# Patient Record
Sex: Male | Born: 1960 | Race: Black or African American | Hispanic: No | Marital: Married | Smoking: Former smoker
Health system: Southern US, Community
[De-identification: ages and names within clinical notes are randomized; demographics above are authoritative.]

## PROBLEM LIST (undated history)

## (undated) DIAGNOSIS — C801 Malignant (primary) neoplasm, unspecified: Secondary | ICD-10-CM

## (undated) DIAGNOSIS — I10 Essential (primary) hypertension: Secondary | ICD-10-CM

## (undated) DIAGNOSIS — F17200 Nicotine dependence, unspecified, uncomplicated: Secondary | ICD-10-CM

## (undated) DIAGNOSIS — I82409 Acute embolism and thrombosis of unspecified deep veins of unspecified lower extremity: Secondary | ICD-10-CM

## (undated) HISTORY — DX: Acute embolism and thrombosis of unspecified deep veins of unspecified lower extremity: I82.409

## (undated) HISTORY — PX: KNEE SURGERY: SHX244

---

## 1995-07-20 DIAGNOSIS — I10 Essential (primary) hypertension: Secondary | ICD-10-CM

## 1995-07-20 HISTORY — DX: Essential (primary) hypertension: I10

## 2001-03-01 ENCOUNTER — Ambulatory Visit (HOSPITAL_COMMUNITY): Admission: RE | Admit: 2001-03-01 | Discharge: 2001-03-01 | Payer: Self-pay | Admitting: Orthopedic Surgery

## 2001-04-05 ENCOUNTER — Encounter: Payer: Self-pay | Admitting: Orthopedic Surgery

## 2001-04-06 ENCOUNTER — Ambulatory Visit (HOSPITAL_COMMUNITY): Admission: RE | Admit: 2001-04-06 | Discharge: 2001-04-06 | Payer: Self-pay | Admitting: Orthopedic Surgery

## 2001-04-15 ENCOUNTER — Emergency Department (HOSPITAL_COMMUNITY): Admission: EM | Admit: 2001-04-15 | Discharge: 2001-04-15 | Payer: Self-pay | Admitting: Emergency Medicine

## 2001-05-08 ENCOUNTER — Encounter: Admission: RE | Admit: 2001-05-08 | Discharge: 2001-08-06 | Payer: Self-pay | Admitting: Orthopedic Surgery

## 2001-12-15 ENCOUNTER — Emergency Department (HOSPITAL_COMMUNITY): Admission: EM | Admit: 2001-12-15 | Discharge: 2001-12-15 | Payer: Self-pay | Admitting: Emergency Medicine

## 2010-05-01 ENCOUNTER — Emergency Department (HOSPITAL_COMMUNITY): Admission: EM | Admit: 2010-05-01 | Discharge: 2010-05-01 | Payer: Self-pay | Admitting: Emergency Medicine

## 2010-06-22 ENCOUNTER — Encounter: Admission: RE | Admit: 2010-06-22 | Discharge: 2010-06-22 | Payer: Self-pay | Admitting: Occupational Medicine

## 2010-12-04 NOTE — Op Note (Signed)
[place]. [facility]  Patient:    Mitchell Cooper, Mitchell Cooper Visit Number: [id number] MRN: [id number]          Service Type: DSU Location: [facility] [id number] Attending Physician:  Mayme Genta Dictated by:   Sharmon Revere, M.D. Admit Date:  04/06/2001                             Operative Report  CHIEF COMPLAINT:  Painful left knee.  HISTORY OF PRESENT ILLNESS:  This is a 50 year old male who had sustained a fall while at work and injured his left knee.  The patient had been treated with anti-inflammatories and warm compresses with persistent pain and catching in the left knee. The patient had MRI done which demonstrated cartilage and meniscal damage in the left knee. ACL intact. PAST MEDICAL HISTORY:  Arthroscopy of left knee in the past. History of high blood pressure and diabetes.  ALLERGIES:  No known drug allergies.  MEDICATIONS:  Celebrex.  HABITS:  Occasional alcohol use on weekends and smokes less than a pack of cigarettes a day.  FAMILY HISTORY:  Noncontributory.  REVIEW OF SYSTEMS:   As in history of present illness.  No cardiac, respiratory, no urinary or bowel symptoms.  PHYSICAL EXAMINATION:  VITAL SIGNS:  Temperature 98.2, pulse 76, respiratory rate 16, blood pressure 133/88, height 5 feet 9 inches, weight 215.  HEENT:  Normocephalic.  Eyes:  Conjunctiva and sclera clear.  NECK:  Supple.  CHEST:  Clear.  CARDIOVASCULAR:  S1 and S2 regular.  EXTREMITIES:  Left knee with palpable and audible click, medial compartment and anterior joint line pain with tender patellofemoral joint also.  Range of motion is good.  The patient lacks some full flexion.  Negative drawers, negative Lachmans test.  IMPRESSION:  Internal derangement left knee. Dictated by:   Sharmon Revere, M.D. Attending Physician:  Mayme Genta DD:  04/06/01 TD:  04/06/01 Job: [id number]

## 2010-12-04 NOTE — Op Note (Signed)
[place]. [facility]  Patient:    SARTAJ, SITTER Visit Number: [id number] MRN: [id number]          Service Type: DSU Location: [facility] [id number] Attending Physician:  Mayme Genta Dictated by:   Sharmon Revere, M.D. Admit Date:  04/06/2001                             Operative Report  CHIEF COMPLAINT:  Painful left knee.  HISTORY OF PRESENT ILLNESS:  This is a 50 year old male who had sustained a fall while at work and injured his left knee.  The patient had been treated with anti-inflammatories and warm compresses with persistent pain and catching in the left knee. The patient had MRI done which demonstrated cartilage and meniscal damage in the left knee. ACL intact. PAST MEDICAL HISTORY:  Arthroscopy of left knee in the past. History of high blood pressure and diabetes.  ALLERGIES:  No known drug allergies.  MEDICATIONS:  Celebrex.  HABITS:  Occasional alcohol use on weekends and smokes less than a pack of cigarettes a day.  FAMILY HISTORY:  Noncontributory.  REVIEW OF SYSTEMS:   As in history of present illness.  No cardiac, respiratory, no urinary or bowel symptoms.  PHYSICAL EXAMINATION:  VITAL SIGNS:  Temperature 98.2, pulse 76, respiratory rate 16, blood pressure 133/88, height 5 feet 9 inches, weight 215.  HEENT:  Normocephalic.  Eyes:  Conjunctiva and sclera clear.  NECK:  Supple.  CHEST:  Clear.  CARDIOVASCULAR:  S1 and S2 regular.  EXTREMITIES:  Left knee with palpable and audible click, medial compartment and anterior joint line pain with tender patellofemoral joint also.  Range of motion is good.  The patient lacks some full flexion.  Negative drawers, negative Lachmans test.  IMPRESSION:  Internal derangement left knee. Dictated by:   Sharmon Revere, M.D. Attending Physician:  Mayme Genta DD:  04/06/01 TD:  04/06/01 Job: [id number]

## 2010-12-04 NOTE — Op Note (Signed)
[place]. [facility]  Patient:    Mitchell Cooper, Mitchell Cooper Visit Number: [id number] MRN: [id number]          Service Type: DSU Location: [facility] [id number] Attending Physician:  Mayme Genta Dictated by:   Sharmon Revere, M.D. Admit Date:  04/06/2001                             Operative Report  CHIEF COMPLAINT:  Painful left knee.  HISTORY OF PRESENT ILLNESS:  This is a 50 year old male who had sustained a fall while at work and injured his left knee.  The patient had been treated with anti-inflammatories and warm compresses with persistent pain and catching in the left knee. The patient had MRI done which demonstrated cartilage and meniscal damage in the left knee. ACL intact. PAST MEDICAL HISTORY:  Arthroscopy of left knee in the past. History of high blood pressure and diabetes.  ALLERGIES:  No known drug allergies.  MEDICATIONS:  Celebrex.  HABITS:  Occasional alcohol use on weekends and smokes less than a pack of cigarettes a day.  FAMILY HISTORY:  Noncontributory.  REVIEW OF SYSTEMS:   As in history of present illness.  No cardiac, respiratory, no urinary or bowel symptoms.  PHYSICAL EXAMINATION:  VITAL SIGNS:  Temperature 98.2, pulse 76, respiratory rate 16, blood pressure 133/88, height 5 feet 9 inches, weight 215.  HEENT:  Normocephalic.  Eyes:  Conjunctiva and sclera clear.  NECK:  Supple.  CHEST:  Clear.  CARDIOVASCULAR:  S1 and S2 regular.  EXTREMITIES:  Left knee with palpable and audible click, medial compartment and anterior joint line pain with tender patellofemoral joint also.  Range of motion is good.  The patient lacks some full flexion.  Negative drawers, negative Lachmans test.  IMPRESSION:  Internal derangement left knee. Dictated by:   Sharmon Revere, M.D. Attending Physician:  Mayme Genta DD:  04/06/01 TD:  04/06/01 Job: [id number]

## 2010-12-04 NOTE — Op Note (Signed)
[place]. [facility]  Patient:    Mitchell Cooper, Mitchell Cooper Visit Number: [id number] MRN: [id number]          Service Type: EMS Location: ED Attending Physician:  Orlie Dakin Dictated by:   Sharmon Revere, M.D. Proc. Date: 04/06/01 Admit Date:  04/15/2001 Discharge Date: 04/15/2001                             Operative Report  PREOPERATIVE DIAGNOSIS:  Internal derangement, left knee.  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  ANESTHESIA:  General.  PROCEDURE:  Removal of loose body, synovectomy, chondroplasty patella and femoral condyle, and partial meniscectomy.  DESCRIPTION OF PROCEDURE:  The patient was taken to the operating room after given adequate preoperative medication, given general anesthesia and intubated.  The left knee was prepped with DuraPrep and draped in a sterile manner.  Tourniquet used for hemostasis.  One half inch puncture wound made along the medial and lateral joint line.  Inflow brought through the medial suprapatellar pouch area.  Inspection of the joint revealed hypertrophic synovium both medial and lateral compartments, chondral defect involving the patella as well as some intercondylar notch of the femur, and chondral fragment which had bone off from the femur.  With the use of synovial shaver, synovectomy was done.  Chondroplasty of the patella and the femoral condyle was done with removal of loose fragments with partial resection of the meniscus along the posterior horn.  Copious and abundant irrigation was done. Wound closure was then done with 0 nylon.  Marcaine injected into the joint. Compressive dressing applied.  Knee immobilizer applied.  The patient tolerated the procedure quite well and went to the recovery room in stable and satisfactory condition. Dictated by:   Sharmon Revere, M.D. Attending Physician:  Orlie Dakin DD:   07/16/01 TD:  07/16/01 Job: [id number]

## 2010-12-04 NOTE — Op Note (Signed)
[place]. [facility]  Patient:    Mitchell Cooper, Mitchell Cooper Visit Number: [id number] MRN: [id number]          Service Type: DSU Location: [facility] [id number] Attending Physician:  Mayme Genta Dictated by:   Sharmon Revere, M.D. Admit Date:  04/06/2001                             Operative Report  CHIEF COMPLAINT:  Painful left knee.  HISTORY OF PRESENT ILLNESS:  This is a 50 year old male who had sustained a fall while at work and injured his left knee.  The patient had been treated with anti-inflammatories and warm compresses with persistent pain and catching in the left knee. The patient had MRI done which demonstrated cartilage and meniscal damage in the left knee. ACL intact. PAST MEDICAL HISTORY:  Arthroscopy of left knee in the past. History of high blood pressure and diabetes.  ALLERGIES:  No known drug allergies.  MEDICATIONS:  Celebrex.  HABITS:  Occasional alcohol use on weekends and smokes less than a pack of cigarettes a day.  FAMILY HISTORY:  Noncontributory.  REVIEW OF SYSTEMS:   As in history of present illness.  No cardiac, respiratory, no urinary or bowel symptoms.  PHYSICAL EXAMINATION:  VITAL SIGNS:  Temperature 98.2, pulse 76, respiratory rate 16, blood pressure 133/88, height 5 feet 9 inches, weight 215.  HEENT:  Normocephalic.  Eyes:  Conjunctiva and sclera clear.  NECK:  Supple.  CHEST:  Clear.  CARDIOVASCULAR:  S1 and S2 regular.  EXTREMITIES:  Left knee with palpable and audible click, medial compartment and anterior joint line pain with tender patellofemoral joint also.  Range of motion is good.  The patient lacks some full flexion.  Negative drawers, negative Lachmans test.  IMPRESSION:  Internal derangement left knee. Dictated by:   Sharmon Revere, M.D. Attending Physician:  Mayme Genta DD:  04/06/01 TD:  04/06/01 Job: [id number]

## 2010-12-04 NOTE — Op Note (Signed)
[place]. [facility]  Patient:    Mitchell Cooper, Mitchell Cooper Visit Number: [id number] MRN: [id number]          Service Type: DSU Location: [facility] [id number] Attending Physician:  Mayme Genta Dictated by:   Sharmon Revere, M.D. Admit Date:  04/06/2001                             Operative Report  CHIEF COMPLAINT:  Painful left knee.  HISTORY OF PRESENT ILLNESS:  This is a 50 year old male who had sustained a fall while at work and injured his left knee.  The patient had been treated with anti-inflammatories and warm compresses with persistent pain and catching in the left knee. The patient had MRI done which demonstrated cartilage and meniscal damage in the left knee. ACL intact. PAST MEDICAL HISTORY:  Arthroscopy of left knee in the past. History of high blood pressure and diabetes.  ALLERGIES:  No known drug allergies.  MEDICATIONS:  Celebrex.  HABITS:  Occasional alcohol use on weekends and smokes less than a pack of cigarettes a day.  FAMILY HISTORY:  Noncontributory.  REVIEW OF SYSTEMS:   As in history of present illness.  No cardiac, respiratory, no urinary or bowel symptoms.  PHYSICAL EXAMINATION:  VITAL SIGNS:  Temperature 98.2, pulse 76, respiratory rate 16, blood pressure 133/88, height 5 feet 9 inches, weight 215.  HEENT:  Normocephalic.  Eyes:  Conjunctiva and sclera clear.  NECK:  Supple.  CHEST:  Clear.  CARDIOVASCULAR:  S1 and S2 regular.  EXTREMITIES:  Left knee with palpable and audible click, medial compartment and anterior joint line pain with tender patellofemoral joint also.  Range of motion is good.  The patient lacks some full flexion.  Negative drawers, negative Lachmans test.  IMPRESSION:  Internal derangement left knee. Dictated by:   Sharmon Revere, M.D. Attending Physician:  Mayme Genta DD:  04/06/01 TD:  04/06/01 Job: [id number]

## 2010-12-04 NOTE — Op Note (Signed)
[place]. [facility]  Patient:    Mitchell Cooper, Mitchell Cooper Visit Number: [id number] MRN: [id number]          Service Type: DSU Location: [facility] [id number] Attending Physician:  Mayme Genta Dictated by:   Sharmon Revere, M.D. Admit Date:  04/06/2001                             Operative Report  CHIEF COMPLAINT:  Painful left knee.  HISTORY OF PRESENT ILLNESS:  This is a 50 year old male who had sustained a fall while at work and injured his left knee.  The patient had been treated with anti-inflammatories and warm compresses with persistent pain and catching in the left knee. The patient had MRI done which demonstrated cartilage and meniscal damage in the left knee. ACL intact. PAST MEDICAL HISTORY:  Arthroscopy of left knee in the past. History of high blood pressure and diabetes.  ALLERGIES:  No known drug allergies.  MEDICATIONS:  Celebrex.  HABITS:  Occasional alcohol use on weekends and smokes less than a pack of cigarettes a day.  FAMILY HISTORY:  Noncontributory.  REVIEW OF SYSTEMS:   As in history of present illness.  No cardiac, respiratory, no urinary or bowel symptoms.  PHYSICAL EXAMINATION:  VITAL SIGNS:  Temperature 98.2, pulse 76, respiratory rate 16, blood pressure 133/88, height 5 feet 9 inches, weight 215.  HEENT:  Normocephalic.  Eyes:  Conjunctiva and sclera clear.  NECK:  Supple.  CHEST:  Clear.  CARDIOVASCULAR:  S1 and S2 regular.  EXTREMITIES:  Left knee with palpable and audible click, medial compartment and anterior joint line pain with tender patellofemoral joint also.  Range of motion is good.  The patient lacks some full flexion.  Negative drawers, negative Lachmans test.  IMPRESSION:  Internal derangement left knee. Dictated by:   Sharmon Revere, M.D. Attending Physician:  Mayme Genta DD:  04/06/01 TD:  04/06/01 Job: [id number]

## 2010-12-04 NOTE — Op Note (Signed)
[place]. [facility]  Patient:    Mitchell Cooper, Mitchell Cooper Visit Number: [id number] MRN: [id number]          Service Type: DSU Location: [facility] [id number] Attending Physician:  Mayme Genta Dictated by:   Sharmon Revere, M.D. Admit Date:  04/06/2001                             Operative Report  CHIEF COMPLAINT:  Painful left knee.  HISTORY OF PRESENT ILLNESS:  This is a 50 year old male who had sustained a fall while at work and injured his left knee.  The patient had been treated with anti-inflammatories and warm compresses with persistent pain and catching in the left knee. The patient had MRI done which demonstrated cartilage and meniscal damage in the left knee. ACL intact. PAST MEDICAL HISTORY:  Arthroscopy of left knee in the past. History of high blood pressure and diabetes.  ALLERGIES:  No known drug allergies.  MEDICATIONS:  Celebrex.  HABITS:  Occasional alcohol use on weekends and smokes less than a pack of cigarettes a day.  FAMILY HISTORY:  Noncontributory.  REVIEW OF SYSTEMS:   As in history of present illness.  No cardiac, respiratory, no urinary or bowel symptoms.  PHYSICAL EXAMINATION:  VITAL SIGNS:  Temperature 98.2, pulse 76, respiratory rate 16, blood pressure 133/88, height 5 feet 9 inches, weight 215.  HEENT:  Normocephalic.  Eyes:  Conjunctiva and sclera clear.  NECK:  Supple.  CHEST:  Clear.  CARDIOVASCULAR:  S1 and S2 regular.  EXTREMITIES:  Left knee with palpable and audible click, medial compartment and anterior joint line pain with tender patellofemoral joint also.  Range of motion is good.  The patient lacks some full flexion.  Negative drawers, negative Lachmans test.  IMPRESSION:  Internal derangement left knee. Dictated by:   Sharmon Revere, M.D. Attending Physician:  Mayme Genta DD:  04/06/01 TD:  04/06/01 Job: [id number]

## 2010-12-04 NOTE — H&P (Signed)
[place]. [facility]  Patient:    Mitchell Cooper, Mitchell Cooper Visit Number: [id number] MRN: [id number]          Service Type: DSU Location: [facility] [id number] Attending Physician:  Mayme Genta Dictated by:   Sharmon Revere, M.D. Admit Date:  04/06/2001                           History and Physical  CHIEF COMPLAINT:  Painful left knee.  HISTORY OF PRESENT ILLNESS:  This is a 50 year old male who had sustained a fall while at work and injured his left knee.  The patient had been treated with anti-inflammatories and warm compresses with persistent pain and catching in the left knee. The patient had MRI done which demonstrated cartilage and meniscal damage in the left knee. ACL intact. PAST MEDICAL HISTORY:  Arthroscopy of left knee in the past. History of high blood pressure and diabetes.  ALLERGIES:  No known drug allergies.  MEDICATIONS:  Celebrex.  HABITS:  Occasional alcohol use on weekends and smokes less than a pack of cigarettes a day.  FAMILY HISTORY:  Noncontributory.  REVIEW OF SYSTEMS:   As in history of present illness.  No cardiac, respiratory, no urinary or bowel symptoms.  PHYSICAL EXAMINATION:  VITAL SIGNS:  Temperature 98.2, pulse 76, respiratory rate 16, blood pressure 133/88, height 5 feet 9 inches, weight 215.  HEENT:  Normocephalic.  Eyes:  Conjunctiva and sclera clear.  NECK:  Supple.  CHEST:  Clear.  CARDIOVASCULAR:  S1 and S2 regular.  EXTREMITIES:  Left knee with palpable and audible click, medial compartment and anterior joint line pain with tender patellofemoral joint also.  Range of motion is good.  The patient lacks some full flexion.  Negative drawers, negative Lachmans test.  IMPRESSION:  Internal derangement left knee. Dictated by:   Sharmon Revere, M.D. Attending Physician:  Mayme Genta DD:  04/06/01 TD:  04/06/01 Job: [id number]

## 2010-12-04 NOTE — Op Note (Signed)
[place]. [facility]  Patient:    CORDE, MOURER Visit Number: [id number] MRN: [id number]          Service Type: DSU Location: [facility] [id number] Attending Physician:  Mayme Genta Dictated by:   Sharmon Revere, M.D. Admit Date:  04/06/2001                             Operative Report  CHIEF COMPLAINT:  Painful left knee.  HISTORY OF PRESENT ILLNESS:  This is a 50 year old male who had sustained a fall while at work and injured his left knee.  The patient had been treated with anti-inflammatories and warm compresses with persistent pain and catching in the left knee. The patient had MRI done which demonstrated cartilage and meniscal damage in the left knee. ACL intact. PAST MEDICAL HISTORY:  Arthroscopy of left knee in the past. History of high blood pressure and diabetes.  ALLERGIES:  No known drug allergies.  MEDICATIONS:  Celebrex.  HABITS:  Occasional alcohol use on weekends and smokes less than a pack of cigarettes a day.  FAMILY HISTORY:  Noncontributory.  REVIEW OF SYSTEMS:   As in history of present illness.  No cardiac, respiratory, no urinary or bowel symptoms.  PHYSICAL EXAMINATION:  VITAL SIGNS:  Temperature 98.2, pulse 76, respiratory rate 16, blood pressure 133/88, height 5 feet 9 inches, weight 215.  HEENT:  Normocephalic.  Eyes:  Conjunctiva and sclera clear.  NECK:  Supple.  CHEST:  Clear.  CARDIOVASCULAR:  S1 and S2 regular.  EXTREMITIES:  Left knee with palpable and audible click, medial compartment and anterior joint line pain with tender patellofemoral joint also.  Range of motion is good.  The patient lacks some full flexion.  Negative drawers, negative Lachmans test.  IMPRESSION:  Internal derangement left knee. Dictated by:   Sharmon Revere, M.D. Attending Physician:  Mayme Genta DD:  04/06/01 TD:  04/06/01 Job: [id number]

## 2011-01-14 ENCOUNTER — Emergency Department (HOSPITAL_COMMUNITY): Payer: No Typology Code available for payment source

## 2011-01-14 ENCOUNTER — Emergency Department (HOSPITAL_COMMUNITY)
Admission: EM | Admit: 2011-01-14 | Discharge: 2011-01-14 | Disposition: A | Discharge status: Home / Self Care | Payer: No Typology Code available for payment source | Attending: Emergency Medicine | Admitting: Emergency Medicine

## 2011-01-14 DIAGNOSIS — M25569 Pain in unspecified knee: Secondary | ICD-10-CM | POA: Insufficient documentation

## 2011-01-14 DIAGNOSIS — S83106A Unspecified dislocation of unspecified knee, initial encounter: Secondary | ICD-10-CM | POA: Insufficient documentation

## 2015-04-28 ENCOUNTER — Encounter (HOSPITAL_COMMUNITY): Payer: Self-pay | Admitting: Emergency Medicine

## 2015-04-28 ENCOUNTER — Emergency Department (HOSPITAL_COMMUNITY): Payer: No Typology Code available for payment source

## 2015-04-28 ENCOUNTER — Emergency Department (HOSPITAL_COMMUNITY)
Admission: EM | Admit: 2015-04-28 | Discharge: 2015-04-28 | Disposition: A | Discharge status: Home / Self Care | Payer: No Typology Code available for payment source | Attending: Emergency Medicine | Admitting: Emergency Medicine

## 2015-04-28 DIAGNOSIS — W208XXA Other cause of strike by thrown, projected or falling object, initial encounter: Secondary | ICD-10-CM | POA: Diagnosis not present

## 2015-04-28 DIAGNOSIS — Y9389 Activity, other specified: Secondary | ICD-10-CM | POA: Insufficient documentation

## 2015-04-28 DIAGNOSIS — S6992XA Unspecified injury of left wrist, hand and finger(s), initial encounter: Secondary | ICD-10-CM | POA: Insufficient documentation

## 2015-04-28 DIAGNOSIS — M25532 Pain in left wrist: Secondary | ICD-10-CM

## 2015-04-28 DIAGNOSIS — Y998 Other external cause status: Secondary | ICD-10-CM | POA: Diagnosis not present

## 2015-04-28 DIAGNOSIS — S8992XA Unspecified injury of left lower leg, initial encounter: Secondary | ICD-10-CM | POA: Diagnosis not present

## 2015-04-28 DIAGNOSIS — S4992XA Unspecified injury of left shoulder and upper arm, initial encounter: Secondary | ICD-10-CM | POA: Insufficient documentation

## 2015-04-28 DIAGNOSIS — Y9289 Other specified places as the place of occurrence of the external cause: Secondary | ICD-10-CM | POA: Insufficient documentation

## 2015-04-28 DIAGNOSIS — M25562 Pain in left knee: Secondary | ICD-10-CM

## 2015-04-28 DIAGNOSIS — Z720 Tobacco use: Secondary | ICD-10-CM | POA: Insufficient documentation

## 2015-04-28 DIAGNOSIS — I10 Essential (primary) hypertension: Secondary | ICD-10-CM | POA: Diagnosis not present

## 2015-04-28 DIAGNOSIS — S4991XA Unspecified injury of right shoulder and upper arm, initial encounter: Secondary | ICD-10-CM | POA: Insufficient documentation

## 2015-04-28 HISTORY — DX: Essential (primary) hypertension: I10

## 2015-04-28 MED ORDER — IBUPROFEN 800 MG PO TABS
800.0000 mg | ORAL_TABLET | Freq: Once | ORAL | Status: AC
  Administered 2015-04-28: 800 mg via ORAL
  Filled 2015-04-28: qty 1

## 2015-04-28 MED ORDER — IBUPROFEN 800 MG PO TABS: 800.0000 mg | ORAL_TABLET | Freq: Three times a day (TID) | ORAL | Status: DC

## 2015-04-28 MED ORDER — METHOCARBAMOL 500 MG PO TABS: 500.0000 mg | ORAL_TABLET | Freq: Two times a day (BID) | ORAL | Status: DC

## 2015-04-28 NOTE — ED Provider Notes (Signed)
CSN: [id number]     Arrival date & time 04/28/15  1107 History  By signing my name below, I, Mitchell Cooper, attest that this documentation has been prepared under the direction and in the presence of [employer]. Electronically Signed: Sonum Cooper, Education administrator. 04/28/2015. 12:36 PM.    Chief Complaint  Patient presents with  . Knee Pain    The history is provided by the patient. No language interpreter was used.     HPI Comments: Mitchell Cooper is a 54 y.o. male who presents to the Emergency Department complaining of left knee pain and left wrist pain, which started 2 days ago after a house deck collapsed from underneath him. He denies hitting his head or LOC. He reports movement exacerbates his pain. He has tried warm bath soaks with mild symptom relief. He denies fever, chills, HA, lightheadedness, dizziness, visual disturbances, CP, SOB, abdominal pain, nausea, vomiting, diarrhea, bowel/bladder incontinence, saddle anesthesia.   Past Medical History  Diagnosis Date  . Hypertension    History reviewed. No pertinent past surgical history. No family history on file. Social History  Substance Use Topics  . Smoking status: Current Every Day Smoker  . Smokeless tobacco: None  . Alcohol Use: Yes      Review of Systems  Constitutional: Negative for fever and chills.  Eyes: Negative for visual disturbance.  Respiratory: Negative for shortness of breath.   Cardiovascular: Negative for chest pain.  Gastrointestinal: Negative for nausea, vomiting and abdominal pain.  Musculoskeletal: Positive for myalgias, back pain and arthralgias (left knee, left wrist). Negative for neck pain and neck stiffness.  Skin: Negative for wound.  Neurological: Negative for dizziness, weakness, light-headedness, numbness and headaches.      Allergies  Review of patient's allergies indicates not on file.  Home Medications   Prior to Admission medications   Not on File    BP 191/107 mmHg   Pulse 99  Temp(Src) 98.1 F (36.7 C) (Oral)  Resp 16  SpO2 100% Physical Exam  Constitutional: He is oriented to person, place, and time. He appears well-developed and well-nourished. No distress.  HENT:  Head: Normocephalic and atraumatic.  Right Ear: External ear normal.  Left Ear: External ear normal.  Nose: Nose normal.  Mouth/Throat: Uvula is midline, oropharynx is clear and moist and mucous membranes are normal.  Eyes: Conjunctivae, EOM and lids are normal. Pupils are equal, round, and reactive to light. Right eye exhibits no discharge. Left eye exhibits no discharge. No scleral icterus.  Neck: Normal range of motion. Neck supple. Muscular tenderness present. No spinous process tenderness present.  Mild tenderness to palpation of trapezius bilaterally. No midline cervical tenderness, step-off, or deformity.  Cardiovascular: Normal rate, regular rhythm, normal heart sounds, intact distal pulses and normal pulses.   Pulmonary/Chest: Effort normal and breath sounds normal. No respiratory distress. He has no wheezes. He has no rales.  Abdominal: Soft. Normal appearance. He exhibits no distension and no mass. There is no tenderness. There is no rigidity, no rebound and no guarding.  Musculoskeletal: Normal range of motion. He exhibits tenderness. He exhibits no edema.       Left wrist: He exhibits tenderness and bony tenderness. He exhibits normal range of motion, no swelling, no effusion, no crepitus and no deformity.       Left knee: He exhibits swelling and effusion. He exhibits normal range of motion, no deformity, no erythema, no LCL laxity and no MCL laxity. Tenderness found. Medial joint line and lateral joint  line tenderness noted.  Mild tenderness to palpation of radial aspect of left wrist. Full range of motion of left wrist. No significant edema or erythema. Strength and sensation intact. Distal pulses intact. Cap refill less than 3 seconds. Tenderness to palpation of left knee with  mild edema. No LCL or MCL laxity. Anterior and posterior drawer negative. No significant erythema or heat. Strength and sensation intact. Distal pulses intact.  Neurological: He is alert and oriented to person, place, and time. He has normal strength. No cranial nerve deficit or sensory deficit.  Skin: Skin is warm, dry and intact. No rash noted. He is not diaphoretic. No erythema. No pallor.  Psychiatric: He has a normal mood and affect. His speech is normal and behavior is normal. Judgment and thought content normal.  Nursing note and vitals reviewed.   ED Course  Procedures (including critical care time)  DIAGNOSTIC STUDIES: Oxygen Saturation is 100% on RA, normal by my interpretation.    COORDINATION OF CARE: 12:43 PM Discussed treatment plan with pt at bedside and pt agreed to plan.   Labs Review Labs Reviewed - No data to display  Imaging Review Dg Wrist Complete Left  04/28/2015   CLINICAL DATA:  Fall yesterday, left wrist pain.  EXAM: LEFT WRIST - COMPLETE 3+ VIEW  COMPARISON:  None.  FINDINGS: There is degenerative joint space narrowing at the radiocarpal joint space.  Chronic calcific fragments are seen adjacent to the scaphoid bone and there is widening of the scapholunate distance suggesting an associated ligamentous disruption. Additional calcific fragment is seen near the base of the left first metacarpal bone, of uncertain age, but also suspected to be chronic.  No acute-appearing fracture line or displaced fracture fragment identified.  IMPRESSION: 1. Chronic-appearing calcific fragments adjacent to the left scaphoid bone suggesting previous injury. 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 2. Additional small calcific fragment near the base of the left first metacarpal bone which is of uncertain age but also suspected to be chronic. 3. No convincing evidence of an acute fracture or  dislocation. 4. Degenerative joint space narrowing at the radiocarpal joint.   Electronically Signed   By: Franki Cabot M.D.   On: 04/28/2015 13:54   Dg Knee Complete 4 Views Left  04/28/2015   CLINICAL DATA:  Anterior left knee pain for 1 day after fall  EXAM: LEFT KNEE - COMPLETE 4+ VIEW  COMPARISON:  01/14/2011 left knee radiographs  FINDINGS: Moderate suprapatellar left knee joint effusion. No fracture, dislocation or suspicious focal osseous lesion. Chondrocalcinosis is seen in the medial meniscus. Interval progression of moderate to severe tricompartmental osteoarthritis, most prominent in the patellofemoral compartment.  IMPRESSION: 1. Moderate suprapatellar left knee joint effusion. No fracture or dislocation in the left knee. 2. 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   Electronically Signed   By: Ilona Sorrel M.D.   On: 04/28/2015 12:16   I have personally reviewed and evaluated these images as part of my medical decision-making.   EKG Interpretation None      MDM   Final diagnoses:  Left knee pain  Left wrist pain    54 year old male resents with left knee pain and left wrist pain after a fall, which occurred Saturday. He states he was at a party, standing on a deck, which collapsed. He denies hitting his head  or LOC. He denies fever, chills, HA, lightheadedness, dizziness, visual disturbances, CP, SOB, abdominal pain, nausea, vomiting, diarrhea, bowel/bladder incontinence, saddle anesthesia.   Patient is afebrile. Patient is hypertensive, though he states he has not taken his blood pressure medications today. Head normocephalic and atraumatic. Mild tenderness to palpation of trapezius bilaterally. No midline cervical tenderness, step-off, or deformity. Heart regular rate and rhythm. Lungs clear to auscultation  bilaterally. Abdomen soft, nontender, nondistended. Mild tenderness to palpation of radial aspect of left wrist. Full range of motion of left wrist. No significant edema or erythema. Strength and sensation intact. Distal pulses intact. Tenderness to palpation of left knee with mild edema. No significant erythema or heat. Full range of motion of left knee. Strength and sensation intact. Distal pulses intact. Normal neuro exam with no focal deficit.  Will obtain imaging of left knee and left wrist. Patient given ibuprofen and ice for pain.  Imaging of left knee reveals moderate suprapatellar left knee joint effusion with no fracture or dislocation. Imaging of left wrist negative for acute fracture or dislocation.  Will treat with left knee sleeve and left wrist splint. Patient prescribed ibuprofen and robaxin for symptoms. Patient to follow-up with orthopedist if symptoms do not improve. Return precautions discussed.  I personally performed the services described in this documentation, which was scribed in my presence. The recorded information has been reviewed and is accurate.  BP 186/88 mmHg  Pulse 92  Temp(Src) 98.1 F (36.7 C) (Oral)  Resp 16  SpO2 99%   Marella Chimes, PA-C 04/28/15 1901  Davonna Belling, MD 04/29/15 [id number]

## 2015-04-28 NOTE — Discharge Instructions (Signed)
1. Medications: ibuprofen, robaxin, usual home medications 2. Treatment: rest, drink plenty of fluids, ice, elevate, use splint and knee sleeve 3. Follow Up: please followup with your orthopedist for discussion of your diagnoses and further evaluation after today's visit; please return to the ER for increased swelling, numbness, color change, severe pain, new or worsening symptoms   Knee Pain Knee pain is a common problem. It can have many causes. The pain often goes away by following your doctor's home care instructions. Treatment for ongoing pain will depend on the cause of your pain. If your knee pain continues, more tests may be needed to diagnose your condition. Tests may include X-rays or other imaging studies of your knee. HOME CARE  Take medicines only as told by your doctor.  Rest your knee and keep it raised (elevated) while you are resting.  Do not do things that cause pain or make your pain worse.  Avoid activities where both feet leave the ground at the same time, such as running, jumping rope, or doing jumping jacks.  Apply ice to the knee area:  Put ice in a plastic bag.  Place a towel between your skin and the bag.  Leave the ice on for 20 minutes, 2-3 times a day.  Ask your doctor if you should wear an elastic knee support.  Sleep with a pillow under your knee.  Lose weight if you are overweight. Being overweight can make your knee hurt more.  Do not use any tobacco products, including cigarettes, chewing tobacco, or electronic cigarettes. If you need help quitting, ask your doctor. Smoking may slow the healing of any bone and joint problems that you may have. GET HELP IF:  Your knee pain does not stop, it changes, or it gets worse.  You have a fever along with knee pain.  Your knee gives out or locks up.  Your knee becomes more swollen. GET HELP RIGHT AWAY IF:   Your knee feels hot to the touch.  You have chest pain or trouble breathing.   This  information is not intended to replace advice given to you by your health care provider. Make sure you discuss any questions you have with your health care provider.   Document Released: 10/01/2008 Document Revised: 07/26/2014 Document Reviewed: 09/05/2013 Elsevier Interactive Patient Education 2016 Elsevier Inc.  Wrist Pain There are many things that can cause wrist pain. Some common causes include:  An injury to the wrist area.  Overuse of the joint.  A condition that causes too much pressure to be put on a nerve in the wrist (carpal tunnel syndrome).  Wear and tear of the joints that happens as a person gets older (osteoarthritis).  Other types of arthritis. Sometimes, the cause is not known. The pain often goes away when you follow instructions from your doctor about relieving pain at home. If your wrist pain does not go away, tests may need to be done to find the cause. HOME CARE Pay attention to any changes in your symptoms. Take these actions to help with your pain:  Rest your wrist for at least 48 hours or as told by your doctor.  If your doctor tells you to, put ice on the injured area:  Put ice in a plastic bag.  Place a towel between your skin and the bag.  Leave the ice on for 20 minutes, 2-3 times per day.  Keep your arm raised (elevated) above the level of your heart while you are sitting or  lying down.  If a splint or elastic bandage has been put on the injured area:  Wear it as told by your doctor.  Take the splint or bandage off only as told by your doctor.  Loosen the splint or bandage if your fingers lose feeling (are numb) or have a tingling feeling, or if they turn cold or blue.  Take over-the-counter and prescription medicines only as told by your doctor.  Keep all follow-up visits as told by your doctor. This is important. GET HELP IF:  Your pain is not helped by treatment.  Your pain gets worse. GET HELP RIGHT AWAY IF:   Your fingers  swell.  Your fingers turn white, very red, or cold and blue.  Your fingers lose feeling or have a tingling feeling.  You have trouble moving your fingers.   This information is not intended to replace advice given to you by your health care provider. Make sure you discuss any questions you have with your health care provider.   Document Released: 12/22/2007 Document Revised: 03/26/2015 Document Reviewed: 11/20/2014 Elsevier Interactive Patient Education 2016 Elsevier Inc.  Musculoskeletal Pain Musculoskeletal pain is muscle and boney aches and pains. These pains can occur in any part of the body. Your caregiver may treat you without knowing the cause of the pain. They may treat you if blood or urine tests, X-rays, and other tests were normal.  CAUSES There is often not a definite cause or reason for these pains. These pains may be caused by a type of germ (virus). The discomfort may also come from overuse. Overuse includes working out too hard when your body is not fit. Boney aches also come from weather changes. Bone is sensitive to atmospheric pressure changes. HOME CARE INSTRUCTIONS   Ask when your test results will be ready. Make sure you get your test results.  Only take over-the-counter or prescription medicines for pain, discomfort, or fever as directed by your caregiver. If you were given medications for your condition, do not drive, operate machinery or power tools, or sign legal documents for 24 hours. Do not drink alcohol. Do not take sleeping pills or other medications that may interfere with treatment.  Continue all activities unless the activities cause more pain. When the pain lessens, slowly resume normal activities. Gradually increase the intensity and duration of the activities or exercise.  During periods of severe pain, bed rest may be helpful. Lay or sit in any position that is comfortable.  Putting ice on the injured area.  Put ice in a bag.  Place a towel  between your skin and the bag.  Leave the ice on for 15 to 20 minutes, 3 to 4 times a day.  Follow up with your caregiver for continued problems and no reason can be found for the pain. If the pain becomes worse or does not go away, it may be necessary to repeat tests or do additional testing. Your caregiver may need to look further for a possible cause. SEEK IMMEDIATE MEDICAL CARE IF:  You have pain that is getting worse and is not relieved by medications.  You develop chest pain that is associated with shortness or breath, sweating, feeling sick to your stomach (nauseous), or throw up (vomit).  Your pain becomes localized to the abdomen.  You develop any new symptoms that seem different or that concern you. MAKE SURE YOU:   Understand these instructions.  Will watch your condition.  Will get help right away if you are not  doing well or get worse.   This information is not intended to replace advice given to you by your health care provider. Make sure you discuss any questions you have with your health care provider.   Document Released: 07/05/2005 Document Revised: 09/27/2011 Document Reviewed: 03/09/2013 Elsevier Interactive Patient Education 2016 [employer].   Emergency Department Resource Guide 1) Find a Doctor and Pay Out of Pocket Although you won't have to find out who is covered by your insurance plan, it is a good idea to ask around and get recommendations. You will then need to call the office and see if the doctor you have chosen will accept you as a new patient and what types of options they offer for patients who are self-pay. Some doctors offer discounts or will set up payment plans for their patients who do not have insurance, but you will need to ask so you aren't surprised when you get to your appointment.  2) Contact Your Local Health Department Not all health departments have doctors that can see patients for sick visits, but many do, so it is worth a call to  see if yours does. If you don't know where your local health department is, you can check in your phone book. The CDC also has a tool to help you locate your state's health department, and many state websites also have listings of all of their local health departments.  3) Find a [facility] If your illness is not likely to be very severe or complicated, you may want to try a walk in clinic. These are popping up all over the country in pharmacies, drugstores, and shopping centers. They're usually staffed by nurse practitioners or physician assistants that have been trained to treat common illnesses and complaints. They're usually fairly quick and inexpensive. However, if you have serious medical issues or chronic medical problems, these are probably not your best option.  No Primary Care Doctor: - Call Health Connect at  [phone] - they can help you locate a primary care doctor that  accepts your insurance, provides certain services, etc. - Physician Referral Service- [phone]  Chronic Pain Problems: Organization         Address  Phone   Notes  [facility]  [phone] Patients need to be referred by their primary care doctor.   Medication Assistance: Organization         Address  Phone   Notes  [facility] Medication [facility] [address] [place], [place] [address] [phone] --Must be a resident of [facility] -- Must have NO insurance coverage whatsoever (no Medicaid/ Medicare, etc.) -- The pt. MUST have a primary care doctor that directs their care regularly and follows them in the community   [facility]ist  [phone]   [employer]  [phone]    Agencies that provide inexpensive medical care: Organization         Address  Phone   Notes  [place]  [phone]   [facility]    [phone]   [facility] [place], [place] [address] [phone]   Center [address], [place] [phone]   Planned Parenthood    [phone]   [facility]    [phone]   [place] and [place], [place] Phone:  [phone], Fax:  [phone]  Hours of Operation:  9 am - 6 pm, M-F.  Also accepts Medicaid/Medicare and self-pay.  [facility] for [place], [address], [place] Phone: [phone], Fax: [phone]. Hours of Operation:  8:30 am - 5:30 pm, M-F.  Also accepts Medicaid and self-pay.  [facility] [place] [address] [place] Phone: [phone]   [place], [place], [place] [phone], Ext. [phone] Mondays & Thursdays: 7-9 AM.  First 15 patients are seen on a first come, first serve basis.    [place] Providers:  Organization         Address  Phone   Notes  [facility] [address], [place] [phone] Also accepts self-pay patients.  [facility] [id number] [place], [place]  [phone]   [place], [address], [place] [phone]   [facility] [address], [place] [phone]   Lucianne Lei [address], [place]   [phone] Only accepts [place] Access [place] patients after they have their name applied to their card.   Self-Pay (no insurance) in [facility]:  Organization         Address  Phone   Notes  Sickle Cell Patients, [facility] [place] [phone]   [facility] [place] [phone]   [facility] [place]  [place], [place], [place] [phone]   [facility]/Dr. Osei-Bonsu  [address] [place] or [address], [place] [phone] Phone number for both [place] and [place] locations is the same.  Urgent Medical and [facility] [address] [place] [address] [place] [phone]   [facility] [address], [place] or [address] [phone]   [facility] [address] [place] [phone], phone; [phone], fax Sees patients 1st and 3rd Saturday of every month.  Must not qualify for public or private insurance (i.e. Medicaid, Medicare, [place] Health Choice, Veterans' Benefits)  Household income should be no more than 200% of the poverty level The clinic cannot treat you if you are pregnant or think you are pregnant  Sexually transmitted diseases are not treated at the clinic.    Dental Care: Organization         Address  Phone  Notes  [facility] Department of [facility] [place] [phone] Accepts children up to age 61 who are enrolled in [place] or [place]; pregnant women with a Medicaid card; and children who have applied for Medicaid or [place] Health Choice, but were declined, whose parents can pay a reduced fee at time of service.  [facility] Department of [facility]  [address], [place] [phone] Accepts children up to age 27 who are enrolled in [place] or [place]; pregnant women with a Medicaid card; and children who have applied for Medicaid or [place] Health Choice, but were declined, whose parents can pay a reduced fee at time of service.  [place] Adult Dental Access PROGRAM  [place] [phone] Patients are seen by appointment only. Walk-ins are not accepted. [place] will see  patients 28 years of age and older. Monday - Tuesday (8am-5pm) Most Wednesdays (8:30-5pm) $30 per visit, cash only  [facility] Adult Dental Access PROGRAM  [address], [facility] [phone] Patients are  seen by appointment only. Walk-ins are not accepted. [place] will see patients 27 years of age and older. One Wednesday Evening (Monthly: Volunteer Based).  $30 per visit, cash only  [place]  [phone] for adults; Children under age 27, call Graduate Pediatric Dentistry at [phone]. Children aged 34-14, please call [phone] to request a pediatric application.  Dental services are provided in all areas of dental care including fillings, crowns and bridges, complete and partial dentures, implants, gum treatment, root canals, and extractions. Preventive care is also provided. Treatment is provided to both adults and children. Patients are selected via a lottery and there is often a waiting list.   [facility] [address] [place]  [phone] [email or web address]   [facility] [address] [place], [place] [phone], Ext. [phone] Second and Fourth Thursday of each month, opens at 6:30 AM; Clinic ends at 9 AM.  Patients are seen on a first-come first-served basis, and a limited number are seen during each clinic.   [facility]  [address] Hillard Danker [place], [place] [phone]   Eligibility Requirements You must have lived in [place], [place], or [place] counties for at least the last three months.   You cannot be eligible for state or federal sponsored [employer], including [employer], [place], or [employer].   You generally cannot be eligible for healthcare insurance through your employer.    How to apply: Eligibility screenings are held every Tuesday and Wednesday afternoon from 1:00 pm until 4:00 pm. You do not need an appointment for the interview!  [facility] [address] [place], [place]   [place]  [place] Department  [place]  [phone]     Behavioral Health Resources in the Community: Intensive Outpatient Programs Organization         Address  Phone  Notes  [place]. [address], [place], [place] [phone]   [facility] [address], [place], [place]   ADS: Alcohol & Drug Svcs [address], [place], [place]   [facility] [address],  [place], [place] or [phone]   Substance Abuse Resources Organization         Address  Phone  Notes  Alcohol and Drug Services  [phone]   [place]  [phone]   [facility]   Chinita Pester  [phone]   Residential & Outpatient Substance Abuse Program  [phone]   Psychological Services Organization         Address  Phone  Notes  [facility]  [place]  [phone]   [place] [address], [place] or [phone]    Mobile Crisis Teams Organization         Address  Phone  Notes  [facility], Mobile Crisis Care Unit  [phone]   [facility]  [address] [place], [place]   [facility] [address] [place] [phone]    Self-Help/Support Groups Organization  Address  Phone             Notes  [place]. of [place] - variety of support groups  [place] Call for more information  Narcotics Anonymous (NA), [facility] [address], [employer] [place]  2 meetings at this location   Special educational needs teacher         Address  Phone  Notes  [facility]idential Treatment [place],    [place]  [phone]   [facility]  [address], [place] [id number], [place], [place]   [place], [place] [phone] Admissions: 8am-3pm M-F  Incentives  Substance [place] [address]    [place], [place] [phone]   [facility] [address] [place], [place], [place]   [facility] [address]  [place], [place]   [facility] - Intensive Outpatient [address] Kristeen [address], [place], [place]   [facility] [address].,  [place], [place] [phone] or [phone]   [facility] ([facility]) [address] [place], [place] Accepts Medicaid  [facility] Myra [address]  [place] [phone] Substance Abuse/Addiction Treatment   [facility] Organization         Address  Phone  Notes  [facility]  [phone]   Domenic Schwab, PhD [address] Arlis [place], [place]   [phone] or [phone]   [place], [place] [phone]   [facility] [address], [place], [place] [phone] Insurance/Medicaid/sponsorship through [facility] and Families [address] [place]                                    [place], [place] [phone] [place] [address], [place] [phone]    Dr. Adele Schilder  [phone]   [facility] Dept. 1) [address], [place] 2) [place] 3)  [place], [place] [phone]  [phone]   [place] [phone] or [phone] (After Hours)

## 2015-04-28 NOTE — ED Notes (Signed)
Per pt, states deck collapsed on him, now having left knee pain

## 2016-06-14 ENCOUNTER — Emergency Department (HOSPITAL_COMMUNITY): Payer: BLUE CROSS/BLUE SHIELD

## 2016-06-14 ENCOUNTER — Inpatient Hospital Stay (HOSPITAL_COMMUNITY)
Admission: EM | Admit: 2016-06-14 | Discharge: 2016-06-17 | DRG: 982 | Disposition: A | Discharge status: Home / Self Care | Payer: BLUE CROSS/BLUE SHIELD | Attending: Internal Medicine | Admitting: Internal Medicine

## 2016-06-14 ENCOUNTER — Encounter (HOSPITAL_COMMUNITY): Payer: Self-pay | Admitting: Emergency Medicine

## 2016-06-14 DIAGNOSIS — R0902 Hypoxemia: Secondary | ICD-10-CM | POA: Diagnosis present

## 2016-06-14 DIAGNOSIS — F17200 Nicotine dependence, unspecified, uncomplicated: Secondary | ICD-10-CM | POA: Diagnosis present

## 2016-06-14 DIAGNOSIS — I129 Hypertensive chronic kidney disease with stage 1 through stage 4 chronic kidney disease, or unspecified chronic kidney disease: Secondary | ICD-10-CM | POA: Diagnosis present

## 2016-06-14 DIAGNOSIS — N183 Chronic kidney disease, stage 3 unspecified: Secondary | ICD-10-CM | POA: Diagnosis present

## 2016-06-14 DIAGNOSIS — M7989 Other specified soft tissue disorders: Secondary | ICD-10-CM | POA: Diagnosis not present

## 2016-06-14 DIAGNOSIS — R000 Tachycardia, unspecified: Secondary | ICD-10-CM | POA: Diagnosis present

## 2016-06-14 DIAGNOSIS — I82492 Acute embolism and thrombosis of other specified deep vein of left lower extremity: Secondary | ICD-10-CM | POA: Diagnosis not present

## 2016-06-14 DIAGNOSIS — I82412 Acute embolism and thrombosis of left femoral vein: Secondary | ICD-10-CM | POA: Diagnosis present

## 2016-06-14 DIAGNOSIS — R739 Hyperglycemia, unspecified: Secondary | ICD-10-CM | POA: Diagnosis present

## 2016-06-14 DIAGNOSIS — I213 ST elevation (STEMI) myocardial infarction of unspecified site: Secondary | ICD-10-CM | POA: Diagnosis not present

## 2016-06-14 DIAGNOSIS — I871 Compression of vein: Secondary | ICD-10-CM | POA: Diagnosis not present

## 2016-06-14 DIAGNOSIS — I2699 Other pulmonary embolism without acute cor pulmonale: Secondary | ICD-10-CM | POA: Diagnosis present

## 2016-06-14 DIAGNOSIS — I10 Essential (primary) hypertension: Secondary | ICD-10-CM | POA: Diagnosis not present

## 2016-06-14 DIAGNOSIS — Z86711 Personal history of pulmonary embolism: Secondary | ICD-10-CM | POA: Diagnosis present

## 2016-06-14 HISTORY — DX: Nicotine dependence, unspecified, uncomplicated: F17.200

## 2016-06-14 LAB — I-STAT TROPONIN, ED: Troponin i, poc: 0 ng/mL (ref 0.00–0.08)

## 2016-06-14 LAB — BASIC METABOLIC PANEL
ANION GAP: 11 (ref 5–15)
BUN: 15 mg/dL (ref 6–20)
CALCIUM: 9 mg/dL (ref 8.9–10.3)
CO2: 23 mmol/L (ref 22–32)
Chloride: 104 mmol/L (ref 101–111)
Creatinine, Ser: 1.34 mg/dL — ABNORMAL HIGH (ref 0.61–1.24)
GFR calc Af Amer: >60 mL/min (ref >60)
GFR, EST NON AFRICAN AMERICAN: 59 mL/min — AB (ref >60)
GLUCOSE: 121 mg/dL — AB (ref 65–99)
Potassium: 3.6 mmol/L (ref 3.5–5.1)
Sodium: 138 mmol/L (ref 135–145)

## 2016-06-14 LAB — CBC
HEMATOCRIT: 42.3 % (ref 39.0–52.0)
HEMOGLOBIN: 14.8 g/dL (ref 13.0–17.0)
MCH: 28 pg (ref 26.0–34.0)
MCHC: 35 g/dL (ref 30.0–36.0)
MCV: 80.1 fL (ref 78.0–100.0)
Platelets: 166 10*3/uL (ref 150–400)
RBC: 5.28 MIL/uL (ref 4.22–5.81)
RDW: 13.9 % (ref 11.5–15.5)
WBC: 9.8 10*3/uL (ref 4.0–10.5)

## 2016-06-14 LAB — PROTIME-INR
INR: 1.1
Prothrombin Time: 14.2 seconds (ref 11.4–15.2)

## 2016-06-14 LAB — APTT: APTT: 29 s (ref 24–36)

## 2016-06-14 MED ORDER — ACETAMINOPHEN 650 MG RE SUPP: 650.0000 mg | Freq: Four times a day (QID) | RECTAL | Status: DC | PRN

## 2016-06-14 MED ORDER — SODIUM CHLORIDE 0.9% FLUSH
3.0000 mL | Freq: Two times a day (BID) | INTRAVENOUS | Status: DC
  Administered 2016-06-17: 3 mL via INTRAVENOUS

## 2016-06-14 MED ORDER — IOPAMIDOL (ISOVUE-370) INJECTION 76%
INTRAVENOUS | Status: AC
  Filled 2016-06-14: qty 100

## 2016-06-14 MED ORDER — AMLODIPINE BESYLATE 5 MG PO TABS
5.0000 mg | ORAL_TABLET | Freq: Every day | ORAL | Status: DC
  Administered 2016-06-15 – 2016-06-17 (×3): 5 mg via ORAL
  Filled 2016-06-14 (×3): qty 1

## 2016-06-14 MED ORDER — NICOTINE 7 MG/24HR TD PT24
7.0000 mg | MEDICATED_PATCH | Freq: Every day | TRANSDERMAL | Status: DC | PRN
  Filled 2016-06-14: qty 1

## 2016-06-14 MED ORDER — ACETAMINOPHEN 325 MG PO TABS
650.0000 mg | ORAL_TABLET | Freq: Four times a day (QID) | ORAL | Status: DC | PRN
  Administered 2016-06-14 – 2016-06-15 (×2): 650 mg via ORAL
  Filled 2016-06-14 (×2): qty 2

## 2016-06-14 MED ORDER — LACTATED RINGERS IV SOLN
INTRAVENOUS | Status: DC
  Administered 2016-06-15 – 2016-06-16 (×3): via INTRAVENOUS

## 2016-06-14 MED ORDER — ONDANSETRON HCL 4 MG/2ML IJ SOLN: 4.0000 mg | Freq: Four times a day (QID) | INTRAMUSCULAR | Status: DC | PRN

## 2016-06-14 MED ORDER — SODIUM CHLORIDE 0.9 % IV BOLUS (SEPSIS)
1000.0000 mL | Freq: Once | INTRAVENOUS | Status: AC
  Administered 2016-06-14: 1000 mL via INTRAVENOUS

## 2016-06-14 MED ORDER — SENNOSIDES-DOCUSATE SODIUM 8.6-50 MG PO TABS: 1.0000 | ORAL_TABLET | Freq: Every evening | ORAL | Status: DC | PRN

## 2016-06-14 MED ORDER — SODIUM CHLORIDE 0.9 % IJ SOLN
INTRAMUSCULAR | Status: AC
  Filled 2016-06-14: qty 50

## 2016-06-14 MED ORDER — HYDRALAZINE HCL 20 MG/ML IJ SOLN: 10.0000 mg | INTRAMUSCULAR | Status: DC | PRN

## 2016-06-14 MED ORDER — HEPARIN (PORCINE) IN NACL 100-0.45 UNIT/ML-% IJ SOLN
1700.0000 [IU]/h | INTRAMUSCULAR | Status: DC
  Administered 2016-06-14 (×2): 1500 [IU]/h via INTRAVENOUS
  Administered 2016-06-15: 1700 [IU]/h via INTRAVENOUS
  Filled 2016-06-14 (×2): qty 250

## 2016-06-14 MED ORDER — IOPAMIDOL (ISOVUE-370) INJECTION 76%
100.0000 mL | Freq: Once | INTRAVENOUS | Status: AC | PRN
Start: 2016-06-14 — End: 2016-06-14
  Administered 2016-06-14: 100 mL via INTRAVENOUS

## 2016-06-14 MED ORDER — OXYCODONE HCL 5 MG PO TABS
5.0000 mg | ORAL_TABLET | ORAL | Status: DC | PRN
  Administered 2016-06-14 – 2016-06-15 (×3): 5 mg via ORAL
  Filled 2016-06-14 (×3): qty 1

## 2016-06-14 MED ORDER — ONDANSETRON HCL 4 MG PO TABS: 4.0000 mg | ORAL_TABLET | Freq: Four times a day (QID) | ORAL | Status: DC | PRN

## 2016-06-14 MED ORDER — HEPARIN BOLUS VIA INFUSION
4000.0000 [IU] | INTRAVENOUS | Status: AC
  Administered 2016-06-14: 4000 [IU] via INTRAVENOUS
  Filled 2016-06-14: qty 4000

## 2016-06-14 NOTE — ED Provider Notes (Signed)
[facility] DEPT Provider Note   CSN: [id number] Arrival date & time: 06/14/16  1613     History   Chief Complaint Chief Complaint  Patient presents with  . Shortness of Breath  . Hypertension    HPI Mitchell Cooper is a 55 y.o. male who presents with CP and SOB. PMH significant for HTN however he is not on any medicines although he has been told he needs to be. He states that over the past week he came down with URI symptoms. These got better throughout the week however 2 days ago he started to have chills, become more SOB, productive cough, and has inspiratory CP on the right side. The pain is intermittent, worse with movement. He does have chronic swelling in the L leg which he attributes to an remote surgery in the leg. No calf pain. No recent surgery/travel/immobilization, hx of cancer, hemptysis, prior DVT/PE, or hormone use. No fever, vision changes, HA, dizziness/lightheadness, syncope, abdominal pain, N/V. He does travel to [place] daily for his job which is ~ an hour away.   HPI  Past Medical History:  Diagnosis Date  . Hypertension     There are no active problems to display for this patient.   History reviewed. No pertinent surgical history.     Home Medications    Prior to Admission medications   Not on File    Family History No family history on file.  Social History Social History  Substance Use Topics  . Smoking status: Current Every Day Smoker  . Smokeless tobacco: Not on file  . Alcohol use Yes     Allergies   Patient has no allergy information on record.   Review of Systems Review of Systems  Constitutional: Positive for chills. Negative for fever.  Respiratory: Positive for cough and shortness of breath. Negative for wheezing.   Cardiovascular: Positive for chest pain (inspiratory) and leg swelling. Negative for palpitations.  Gastrointestinal: Negative for abdominal pain, nausea and vomiting.  Neurological: Negative for  dizziness, syncope, light-headedness and headaches.  All other systems reviewed and are negative.    Physical Exam Updated Vital Signs BP (!) 214/119 (BP Location: Left Arm)   Pulse 120   Temp 97.7 F (36.5 C) (Oral)   Resp 16   Ht 5\' 9"  (1.753 m)   Wt 97.5 kg   SpO2 97%   BMI 31.75 kg/m   Physical Exam  Constitutional: He is oriented to person, place, and time. He appears well-developed and well-nourished. No distress.  Pleasant, NAD  HENT:  Head: Normocephalic and atraumatic.  Eyes: Conjunctivae are normal. Pupils are equal, round, and reactive to light. Right eye exhibits no discharge. Left eye exhibits no discharge. No scleral icterus.  Neck: Normal range of motion.  Cardiovascular: Regular rhythm.  Tachycardia present.  Exam reveals no gallop and no friction rub.   No murmur heard. Pulmonary/Chest: Effort normal and breath sounds normal. No respiratory distress. He has no wheezes. He has no rales. He exhibits no tenderness.  Abdominal: Soft. Bowel sounds are normal. He exhibits no distension and no mass. There is no tenderness. There is no rebound and no guarding.  Musculoskeletal: He exhibits edema.  L leg swelling with old scar over medial aspect of knee  Neurological: He is alert and oriented to person, place, and time.  Skin: Skin is warm and dry.  Psychiatric: He has a normal mood and affect. His behavior is normal.  Nursing note and vitals reviewed.    ED  Treatments / Results  Labs (all labs ordered are listed, but only abnormal results are displayed) Labs Reviewed  BASIC METABOLIC PANEL - Abnormal; Notable for the following:       Result Value   Glucose, Bld 121 (*)    Creatinine, Ser 1.34 (*)    GFR calc non Af Amer 59 (*)    All other components within normal limits  CBC  I-STAT TROPOININ, ED    EKG  EKG Interpretation  Date/Time:  Monday June 14 2016 16:27:32 EST Ventricular Rate:  115 PR Interval:    QRS Duration: 89 QT  Interval:  322 QTC Calculation: 446 R Axis:   64 Text Interpretation:  Sinus tachycardia Ventricular premature complex Borderline T wave abnormalities Since last tracing rate faster Otherwise no significant change Confirmed by KNOTT MD, DANIEL [phone]) on 06/14/2016 5:57:34 PM       Radiology Dg Chest 2 View  Result Date: 06/14/2016 CLINICAL DATA:  Shortness of breath on Saturday. Congestive cough with green -yellow mucus. EXAM: CHEST  2 VIEW COMPARISON:  06/22/2010 FINDINGS: Size is normal. There is mild elevation of the right hemidiaphragm. There is perihilar peribronchial thickening. At the lung bases bilaterally, there is mild infiltrate or atelectasis/scarring. No evidence for pulmonary edema. IMPRESSION: 1. Bronchitic changes. 2. Bibasilar atelectasis or early infiltrates. Electronically Signed   By: Nolon Nations M.D.   On: 06/14/2016 17:09    Procedures Procedures (including critical care time)  Medications Ordered in ED Medications  sodium chloride 0.9 % injection (not administered)  iopamidol (ISOVUE-370) 76 % injection (not administered)  sodium chloride 0.9 % bolus 1,000 mL (1,000 mLs Intravenous New Bag/Given 06/14/16 1802)  iopamidol (ISOVUE-370) 76 % injection 100 mL (100 mLs Intravenous Contrast Given 06/14/16 1743)     Initial Impression / Assessment and Plan / ED Course  I have reviewed the triage vital signs and the nursing notes.  Pertinent labs & imaging results that were available during my care of the patient were reviewed by me and considered in my medical decision making (see chart for details).  Clinical Course    55 year old male with bilateral PE without evidence of heart strain. Likely from DVT in LLE. He is tachycardic and hypertensive. No signs of hypertensive emergency therefore will defer treatment of BP to hospitalist team. CTA confirms PE with possible infarct over RLL and pleural effusion. EKG is sinus tach. CBC unremarkable. BMP remarkable for  mild elevation of SCr (1.34) and glucose of 121. Troponin is 0. IVF and heparin started. Spoke with Dr. Lorin Mercy with hospitalist service who will admit. Appreciate assistance.   Final Clinical Impressions(s) / ED Diagnoses   Final diagnoses:  Other acute pulmonary embolism without acute cor pulmonale ([place])  Hypertension, unspecified type    New Prescriptions New Prescriptions   No medications on file     Recardo Evangelist, PA-C 06/15/16 1343    Leo Grosser, MD 06/15/16 1537

## 2016-06-14 NOTE — ED Notes (Signed)
Unable to collect labs patient is not in the room 

## 2016-06-14 NOTE — Progress Notes (Signed)
ANTICOAGULATION CONSULT NOTE - Initial Consult  Pharmacy Consult for IV heparin Indication: PE  Not on File  Patient Measurements: Height: 5\' 9"  (175.3 cm) Weight: 215 lb (97.5 kg) IBW/kg (Calculated) : 70.7 Heparin Dosing Weight: 91.1kg  Vital Signs: Temp: 97.7 F (36.5 C) (11/27 1620) Temp Source: Oral (11/27 1620) BP: 214/119 (11/27 1620) Pulse Rate: 120 (11/27 1620)  Labs:  Recent Labs  06/14/16 1642  HGB 14.8  HCT 42.3  PLT 166  CREATININE 1.34*    Estimated Creatinine Clearance: 72.6 mL/min (by C-G formula based on SCr of 1.34 mg/dL (H)).   Medical History: Past Medical History:  Diagnosis Date  . Hypertension     Assessment: 76 yoM presents with worsening SOB and right-sided pain, found to have bilateral PE, no evidence of right heart strain. Pharmacy consulted to start IV heparin.  No anticoagulants or antiplatelets PTA. No issues with CBC.  SCr elevated, CrCl ~73 ml/min.    Goal of Therapy:  Heparin level 0.3-0.7 units/ml Monitor platelets by anticoagulation protocol: Yes   Plan:  Baseline PT/INR, aPTT Heparin bolus 4000 units IV x1, then heparin infusion 1500 units/hr = 15 ml/hr F/u HL in 6 hours   Ralene Bathe, PharmD, BCPS 06/14/2016, 6:42 PM  Pager: [phone]

## 2016-06-14 NOTE — ED Notes (Signed)
Pt remains on monitor, is watching TV, without complaint.

## 2016-06-14 NOTE — H&P (Signed)
History and Physical    Mitchell Cooper NIO:[id number] DOB: 1960/10/21 DOA: 06/14/2016  PCP: No primary care provider on file. Consultants:  None Patient coming from: home - lives with wife; NOK: wife  Chief Complaint: SOB  HPI: Mitchell Cooper is a 55 y.o. male with medical history significant of HTN, never treated, no PCP.  Friday night noticed a little SOB.  Had a mild cold and thought it was related.  Saturday and particularly Saturday night noticed pain in side and back - ?muscle spasms.  Very hard to take a deep breath, pain with deep breathing and bending over.  Thought maybe a touch of PNA.  All day Sunday, drinking water, took [employer] and felt a little better.  This AM, maybe a little better but still not right so decided to come to the ER.  +SOB.  Slight nasal congestion, mouth breathing for now.  +cough, hard to cough anything up.  Remote left knee surgery (about 10 years ago), has intermittent left knee/leg swelling.  +tobacco - smokes on the weekend when he drinks.  1 pack lasts 2-3 weeks.  No weight loss.  No fevers.   3 hour drive to [place], did not stop en route.  Also commutes 1 1/2 hours each way to work daily.    ED Course: Per PA-C [place]:  55 year old male with bilateral PE without evidence of heart strain. He is tachycardic and hypertensive. No signs of hypertensive emergency therefore will defer treatment of BP to hospitalist team. CTA confirms PE with possible infarct over RLL and pleural effusion. EKG is sinus tach. CBC unremarkable. BMP remarkable for mild elevation of SCr (1.34) and glucose of 121. Troponin is 0. IVF and heparin started.   Review of Systems: As per HPI; otherwise 10 point review of systems reviewed and negative.   Ambulatory Status:  Ambulates without assistance - has to wait a bit after getting out of the car  Past Medical History:  Diagnosis Date  . Hypertension 1997   has never taken medications  . Tobacco dependence     Past  Surgical History:  Procedure Laterality Date  . KNEE SURGERY  approx. 1984   reconstructive surgery of left knee; arthroscopic procedure about 1997    Social History   Social History  . Marital status: Married    Spouse name: N/A  . Number of children: N/A  . Years of education: N/A   Occupational History  . Architect    Social History Main Topics  . Smoking status: Current Some Day Smoker    Types: Cigarettes  . Smokeless tobacco: Never Used     Comment: 1 pack lasts 2 to 3 weeks  . Alcohol use 3.6 oz/week    6 Cans of beer per week  . Drug use: No  . Sexual activity: Not on file   Other Topics Concern  . Not on file   Social History Narrative  . No narrative on file    Not on File  Family History  Problem Relation Age of Onset  . CAD Mother 9    Prior to Admission medications   Not on File    Physical Exam: Vitals:   06/14/16 1810 06/14/16 1811 06/14/16 1900 06/14/16 2046  BP: (!) 169/114  (!) 176/113 173/97  Pulse: 103  101 101  Resp: 24  24 18   Temp:      TempSrc:      SpO2: 92%  94% 90%  Weight:  97.5 kg (215 lb)    Height:  5\' 9"  (1.753 m)       General: Appears calm and comfortable and is NAD, mild SOB Eyes:  PERRL, EOMI, normal lids, iris ENT:  grossly normal hearing, lips & tongue, mmm Neck:  no LAD, masses or thyromegaly Cardiovascular:  Tachycardia, no m/r/g.   Respiratory: CTA bilaterally, no w/r/r. Increased respiratory effort - tachypnea, use of accessory muscles. Abdomen:  soft, ntnd, NABS Skin:  no rash or induration seen on limited exam Musculoskeletal:  grossly normal tone BUE/BLE, good ROM, no bony abnormality.  R calf measures 38 cm.  L calf measures 40.5 cm, is warm to touch.  Negative squeeze, negative Homan's. Psychiatric:  grossly normal mood and affect, speech fluent and appropriate, AOx3 Neurologic:  CN 2-12 grossly intact, moves all extremities in coordinated fashion, sensation intact  Labs on Admission: I have  personally reviewed following labs and imaging studies  CBC:  Recent Labs Lab 06/14/16 1642  WBC 9.8  HGB 14.8  HCT 42.3  MCV 80.1  PLT 768   Basic Metabolic Panel:  Recent Labs Lab 06/14/16 1642  NA 138  K 3.6  CL 104  CO2 23  GLUCOSE 121*  BUN 15  CREATININE 1.34*  CALCIUM 9.0   GFR: Estimated Creatinine Clearance: 72.6 mL/min (by C-G formula based on SCr of 1.34 mg/dL (H)). Liver Function Tests: No results for input(s): AST, ALT, ALKPHOS, BILITOT, PROT, ALBUMIN in the last 168 hours. No results for input(s): LIPASE, AMYLASE in the last 168 hours. No results for input(s): AMMONIA in the last 168 hours. Coagulation Profile:  Recent Labs Lab 06/14/16 1650  INR 1.10   Cardiac Enzymes: No results for input(s): CKTOTAL, CKMB, CKMBINDEX, TROPONINI in the last 168 hours. BNP (last 3 results) No results for input(s): PROBNP in the last 8760 hours. HbA1C: No results for input(s): HGBA1C in the last 72 hours. CBG: No results for input(s): GLUCAP in the last 168 hours. Lipid Profile: No results for input(s): CHOL, HDL, LDLCALC, TRIG, CHOLHDL, LDLDIRECT in the last 72 hours. Thyroid Function Tests: No results for input(s): TSH, T4TOTAL, FREET4, T3FREE, THYROIDAB in the last 72 hours. Anemia Panel: No results for input(s): VITAMINB12, FOLATE, FERRITIN, TIBC, IRON, RETICCTPCT in the last 72 hours. Urine analysis: No results found for: COLORURINE, APPEARANCEUR, LABSPEC, PHURINE, GLUCOSEU, HGBUR, BILIRUBINUR, KETONESUR, PROTEINUR, UROBILINOGEN, NITRITE, LEUKOCYTESUR  Creatinine Clearance: Estimated Creatinine Clearance: 72.6 mL/min (by C-G formula based on SCr of 1.34 mg/dL (H)).  Sepsis Labs: @LABRCNTIP (procalcitonin:4,lacticidven:4) )No results found for this or any previous visit (from the past 240 hour(s)).   Radiological Exams on Admission: Dg Chest 2 View  Result Date: 06/14/2016 CLINICAL DATA:  Shortness of breath on Saturday. Congestive cough with green  -yellow mucus. EXAM: CHEST  2 VIEW COMPARISON:  06/22/2010 FINDINGS: Size is normal. There is mild elevation of the right hemidiaphragm. There is perihilar peribronchial thickening. At the lung bases bilaterally, there is mild infiltrate or atelectasis/scarring. No evidence for pulmonary edema. IMPRESSION: 1. Bronchitic changes. 2. Bibasilar atelectasis or early infiltrates. Electronically Signed   By: Nolon Nations M.D.   On: 06/14/2016 17:09   Ct Angio Chest Pe W/cm &/or Wo Cm  Result Date: 06/14/2016 CLINICAL DATA:  Shortness of breath three days worse with movement and deep breathing. Pain right-sided. Hypertension. Central chest tightness. EXAM: CT ANGIOGRAPHY CHEST WITH CONTRAST TECHNIQUE: Multidetector CT imaging of the chest was performed using the standard protocol during bolus administration of intravenous contrast. Multiplanar  CT image reconstructions and MIPs were obtained to evaluate the vascular anatomy. CONTRAST:  100 mL Isovue 370 IV. COMPARISON:  Chest x-ray 06/14/2016 FINDINGS: Cardiovascular: Heart is normal in size. Thoracic aorta is within normal. Common takeoff of the right brachiocephalic and left common carotid arteries. Moderate burden of bilateral pulmonary emboli right greater than left involving the proximal lobar arteries. RV/LV ratio is 38.1/ 45.1= 0.84. Mediastinum/Nodes: 1.6 cm right hilar lymph node. No mediastinal adenopathy. Remaining mediastinal structures are within normal. Lungs/Pleura: Lungs are adequately inflated and demonstrate linear atelectasis over the left base. There is mild opacification over the posterior right lower lobe with tiny amount of right pleural fluid. Subtle patchy peripheral airspace density over the anterior right upper lobe. Calcified granuloma right lower lobe. Airways are within normal. Upper Abdomen: Small right adrenal adenoma. Musculoskeletal: Mild degenerate change of the spine. Small hemangioma over the lower thoracic spine. Review of the  MIP images confirms the above findings. IMPRESSION: Moderate burden of bilateral pulmonary emboli right greater than left. No evidence of right heart strain. Small tear of infarct over the posterior right lower lobe versus atelectasis. Small amount right pleural fluid. Prominent right hilar lymph node measuring 1.6 cm by short axis likely reactive. Small right adrenal adenoma. Critical Value/emergent results were called by telephone at the time of interpretation on 06/14/2016 at 6:14 pm to Dr. Janetta Hora , who verbally acknowledged these results. Electronically Signed   By: Marin Olp M.D.   On: 06/14/2016 18:14    EKG: Independently reviewed.  Sinus tachycardia with rate 115; nonspecific ST changes with no evidence of acute ischemia  Assessment/Plan Principal Problem:   Pulmonary embolism (HCC) Active Problems:   Uncontrolled hypertension   CKD (chronic kidney disease) stage 3, GFR 30-59 ml/min   Hyperglycemia   Tobacco dependence   PE -Likely etiology appears to be chronic LLE edema related to chronic knee OA in conjunction with prolonged car travel -Will check LLE DVT [place], but that calf is 2.5 cm larger than right -Patient with borderline hypoxia, persistent tachycardia -Will admit for initiation of anticoagulation, education, etc -Patient started on Heparin drip by the ER, will continue -Prolonged counseling about anticoagulation options including Coumadin (with Lovenox bridge) and NOAC; he will check with his insurance company and/or speak with CM to determine whether he can afford NOAC therapy (even with coupon, it tends to be very expensive) -O2 supplementation as needed -IVF  Uncontrolled HTN -Patient reports being told he had HTN 20 years ago, but has not ever taken medication for it. -Based on current levels, he is likely to need multidrug therapy. -For now, diuretic and ACEI are suboptimal due to creatinine elevation (see below). -Based on his ethnicity, thiazide diuretic or  CCB are likely most efficacious. -Will start Norvasc 5 mg daily, but likely to need increase to 10 mg daily quickly. -Will also give hydralazine IV prn SBP >180.  CKD -Creatinine 1.35, unknown baseline. -While this could be at least in part acute kidney injury, based on his long-standing uncontrolled HTN, suspect CKD. -Will minimize nephrotoxic agents -Provide IVF and follow.  Hyperglycemia -Will check A1c. -While this could be a stress response due to acute illness, given his lack of PCP follow up in years, underlying diabetes seems distinctly possible.  Tobacco dependence -Encourage cessation.  This was discussed with the patient and should be reviewed on an ongoing basis.  -Patch ordered if patient needs/requests.  DVT prophylaxis: Heparin drip Code Status:  Full - confirmed with patient/family Family  Communication: Wife present throughout evaluation  Disposition Plan:  Home once clinically improved Consults called: Case management  Admission status: Admit - It is my clinical opinion that admission to INPATIENT is reasonable and necessary because this patient will require at least 2 midnights in the hospital to treat this condition based on the medical complexity of the problems presented.  Given the aforementioned information, the predictability of an adverse outcome is felt to be significant.    Karmen Bongo MD [facility]  If 7PM-7AM, please contact night-coverage [email or web address] Password [email or web address]  06/14/2016, 9:15 PM

## 2016-06-14 NOTE — ED Triage Notes (Signed)
Pt c/o SOB since Saturday. SOB is worse with movement and deep breathing. Pt sts pain is on his R side. Pt also has hx of hypertension and hasn't been on BP medication for a long time due to insurance issues. Pt BP is 214/119 in triage. Pt A&Ox4 and ambulatory Denies any neuro symptoms, N/V. Pt also c/o central chest Tightness. Pt sts he feels like he needs to cough something up but can't.

## 2016-06-15 ENCOUNTER — Inpatient Hospital Stay (HOSPITAL_COMMUNITY): Payer: BLUE CROSS/BLUE SHIELD

## 2016-06-15 DIAGNOSIS — I213 ST elevation (STEMI) myocardial infarction of unspecified site: Secondary | ICD-10-CM

## 2016-06-15 DIAGNOSIS — M7989 Other specified soft tissue disorders: Secondary | ICD-10-CM

## 2016-06-15 LAB — HEPARIN LEVEL (UNFRACTIONATED)
HEPARIN UNFRACTIONATED: 0.17 [IU]/mL — AB (ref 0.30–0.70)
Heparin Unfractionated: 0.28 IU/mL — ABNORMAL LOW (ref 0.30–0.70)
Heparin Unfractionated: 0.32 IU/mL (ref 0.30–0.70)
Heparin Unfractionated: 0.37 IU/mL (ref 0.30–0.70)

## 2016-06-15 LAB — CBC
HCT: 39.9 % (ref 39.0–52.0)
HEMOGLOBIN: 13.7 g/dL (ref 13.0–17.0)
MCH: 27.8 pg (ref 26.0–34.0)
MCHC: 34.3 g/dL (ref 30.0–36.0)
MCV: 81.1 fL (ref 78.0–100.0)
Platelets: 178 10*3/uL (ref 150–400)
RBC: 4.92 MIL/uL (ref 4.22–5.81)
RDW: 14 % (ref 11.5–15.5)
WBC: 8.9 10*3/uL (ref 4.0–10.5)

## 2016-06-15 LAB — BASIC METABOLIC PANEL
ANION GAP: 7 (ref 5–15)
BUN: 14 mg/dL (ref 6–20)
CALCIUM: 8.7 mg/dL — AB (ref 8.9–10.3)
CO2: 25 mmol/L (ref 22–32)
Chloride: 105 mmol/L (ref 101–111)
Creatinine, Ser: 1.17 mg/dL (ref 0.61–1.24)
GLUCOSE: 118 mg/dL — AB (ref 65–99)
Potassium: 3.4 mmol/L — ABNORMAL LOW (ref 3.5–5.1)
Sodium: 137 mmol/L (ref 135–145)

## 2016-06-15 LAB — ECHOCARDIOGRAM COMPLETE
HEIGHTINCHES: 69 in
WEIGHTICAEL: 3350.4 [oz_av]

## 2016-06-15 LAB — PROTIME-INR
INR: 1.12
PROTHROMBIN TIME: 14.4 s (ref 11.4–15.2)

## 2016-06-15 MED ORDER — HEPARIN (PORCINE) IN NACL 100-0.45 UNIT/ML-% IJ SOLN
1850.0000 [IU]/h | INTRAMUSCULAR | Status: DC
  Administered 2016-06-15: 1850 [IU]/h via INTRAVENOUS
  Filled 2016-06-15: qty 250

## 2016-06-15 MED ORDER — HYDRALAZINE HCL 20 MG/ML IJ SOLN
10.0000 mg | INTRAMUSCULAR | Status: DC | PRN
  Administered 2016-06-16 – 2016-06-17 (×3): 10 mg via INTRAVENOUS
  Filled 2016-06-15 (×2): qty 1

## 2016-06-15 MED ORDER — POTASSIUM CHLORIDE CRYS ER 20 MEQ PO TBCR
40.0000 meq | EXTENDED_RELEASE_TABLET | Freq: Once | ORAL | Status: AC
  Administered 2016-06-15: 40 meq via ORAL
  Filled 2016-06-15: qty 2

## 2016-06-15 NOTE — Progress Notes (Signed)
PROGRESS NOTE    Mitchell Cooper  NZV:[id number] DOB: 29-Oct-1960 DOA: 06/14/2016 PCP: No primary care provider on file.    Brief Narrative:  55 y.o. male with medical history significant of HTN, never treated, no PCP.  Friday night noticed a little SOB.  Had a mild cold and thought it was related.  Saturday and particularly Saturday night noticed pain in side and back - ?muscle spasms.  Very hard to take a deep breath, pain with deep breathing and bending over.  Thought maybe a touch of PNA.  All day Sunday, drinking water, took Alka Seltzer Cold Plus and felt a little better.  This AM, maybe a little better but still not right so decided to come to the ER.  +SOB.  Slight nasal congestion, mouth breathing for now.  +cough, hard to cough anything up.  Remote left knee surgery (about 10 years ago), has intermittent left knee/leg swelling.  +tobacco - smokes on the weekend when he drinks.  1 pack lasts 2-3 weeks.  No weight loss.  No fevers.   3 hour drive to [place], did not stop en route.  Also commutes 1 1/2 hours each way to work daily.    Patient found to have bilateral PE on chest CT without right heart strain. LE dopplers with findings of significant clot burden in LLE. Discussed with Vascular Surgery who suggests possible surgery recommends transfer to [facility] for eval.  Assessment & Plan:   Principal Problem:   Pulmonary embolism (HCC) Active Problems:   Uncontrolled hypertension   CKD (chronic kidney disease) stage 3, GFR 30-59 ml/min   Hyperglycemia   Tobacco dependence   1. Bilateral PE 1. CT scan reviewed. No evidence of right hear strain 2. 2d echo done, awaiting results 3. Trop neg 4. Not tachycardic, BP mildly hypertensive 5. Continue heparin gtt for now 2. HTN 1. Bp suboptimal 2. Currently on norvasc and PRN hydralazine 3. Continue to titrate bp meds as tolerated 3. LLE DVT 1. Extensive LLE DVT 2. Discussed case with vascular surgery (Dr. Bradham) who recommends transfer  to [facility] for evaluation of possible surgery 3. Will place transfer orders 4. CKD 3 1. Cr has improved overnight 2. Repeat BMET in am 5. Tobacco abuse 1. Cessation was done  DVT prophylaxis: heparin gtt Code Status: Full Family Communication: Pt in room, family at bedside Disposition Plan: Transfer to [facility]  Consultants:   Dr. Bradham  Procedures:     Antimicrobials: Anti-infectives    None       Subjective: No complaints at present  Objective: Vitals:   06/14/16 2200 06/14/16 2236 06/15/16 0542 06/15/16 1500  BP: (!) 158/102 (!) 182/108 (!) 157/99 (!) 168/98  Pulse: 96 (!) 101 87 86  Resp:  (!) 22 20 20  Temp:  (!) 101 F (38.3 C) 97.9 F (36.6 C) 98.9 F (37.2 C)  TempSrc:  Oral Oral Oral  SpO2: 92% 97% 96% 96%  Weight:  95 kg (209 lb 6.4 oz)    Height:  5\' 9" (1.753 m)      Intake/Output Summary (Last 24 hours) at 06/15/16 1652 Last data filed at 06/15/16 1300  Gross per 24 hour  Intake           2910.9 ml  Output                0 ml  Net           29 10.9 ml   Filed Weights   06/14/16  1811 06/14/16 2236  Weight: 97.5 kg (215 lb) 95 kg (209 lb 6.4 oz)    Examination:  General exam: Appears calm and comfortable  Respiratory system: Clear to auscultation. Respiratory effort normal. Cardiovascular system: S1 & S2 heard, RRR. Gastrointestinal system: Abdomen is nondistended, soft and nontender. No organomegaly or masses felt. Normal bowel sounds heard. Central nervous system: Alert and oriented. No focal neurological deficits. Extremities: Symmetric 5 x 5 power, LLE with increased edema Skin: No rashes, lesions or ulcers Psychiatry: Judgement and insight appear normal. Mood & affect appropriate.   Data Reviewed: I have personally reviewed following labs and imaging studies  CBC:  Recent Labs Lab 06/14/16 1642 06/15/16 0048  WBC 9.8 8.9  HGB 14.8 13.7  HCT 42.3 39.9  MCV 80.1 81.1  PLT 166 625   Basic Metabolic Panel:  Recent Labs Lab  06/14/16 1642 06/15/16 0048  NA 138 137  K 3.6 3.4*  CL 104 105  CO2 23 25  GLUCOSE 121* 118*  BUN 15 14  CREATININE 1.34* 1.17  CALCIUM 9.0 8.7*   GFR: Estimated Creatinine Clearance: 82.1 mL/min (by C-G formula based on SCr of 1.17 mg/dL). Liver Function Tests: No results for input(s): AST, ALT, ALKPHOS, BILITOT, PROT, ALBUMIN in the last 168 hours. No results for input(s): LIPASE, AMYLASE in the last 168 hours. No results for input(s): AMMONIA in the last 168 hours. Coagulation Profile:  Recent Labs Lab 06/14/16 1650 06/15/16 0048  INR 1.10 1.12   Cardiac Enzymes: No results for input(s): CKTOTAL, CKMB, CKMBINDEX, TROPONINI in the last 168 hours. BNP (last 3 results) No results for input(s): PROBNP in the last 8760 hours. HbA1C: No results for input(s): HGBA1C in the last 72 hours. CBG: No results for input(s): GLUCAP in the last 168 hours. Lipid Profile: No results for input(s): CHOL, HDL, LDLCALC, TRIG, CHOLHDL, LDLDIRECT in the last 72 hours. Thyroid Function Tests: No results for input(s): TSH, T4TOTAL, FREET4, T3FREE, THYROIDAB in the last 72 hours. Anemia Panel: No results for input(s): VITAMINB12, FOLATE, FERRITIN, TIBC, IRON, RETICCTPCT in the last 72 hours. Sepsis Labs: No results for input(s): PROCALCITON, LATICACIDVEN in the last 168 hours.  No results found for this or any previous visit (from the past 240 hour(s)).   Radiology Studies: Dg Chest 2 View  Result Date: 06/14/2016 CLINICAL DATA:  Shortness of breath on Saturday. Congestive cough with green -yellow mucus. EXAM: CHEST  2 VIEW COMPARISON:  06/22/2010 FINDINGS: Size is normal. There is mild elevation of the right hemidiaphragm. There is perihilar peribronchial thickening. At the lung bases bilaterally, there is mild infiltrate or atelectasis/scarring. No evidence for pulmonary edema. IMPRESSION: 1. Bronchitic changes. 2. Bibasilar atelectasis or early infiltrates. Electronically Signed   By:  Nolon Nations M.D.   On: 06/14/2016 17:09   Ct Angio Chest Pe W/cm &/or Wo Cm  Result Date: 06/14/2016 CLINICAL DATA:  Shortness of breath three days worse with movement and deep breathing. Pain right-sided. Hypertension. Central chest tightness. EXAM: CT ANGIOGRAPHY CHEST WITH CONTRAST TECHNIQUE: Multidetector CT imaging of the chest was performed using the standard protocol during bolus administration of intravenous contrast. Multiplanar CT image reconstructions and MIPs were obtained to evaluate the vascular anatomy. CONTRAST:  100 mL Isovue 370 IV. COMPARISON:  Chest x-ray 06/14/2016 FINDINGS: Cardiovascular: Heart is normal in size. Thoracic aorta is within normal. Common takeoff of the right brachiocephalic and left common carotid arteries. Moderate burden of bilateral pulmonary emboli right greater than left involving the proximal lobar arteries.  RV/LV ratio is 38.1/ 45.1= 0.84. Mediastinum/Nodes: 1.6 cm right hilar lymph node. No mediastinal adenopathy. Remaining mediastinal structures are within normal. Lungs/Pleura: Lungs are adequately inflated and demonstrate linear atelectasis over the left base. There is mild opacification over the posterior right lower lobe with tiny amount of right pleural fluid. Subtle patchy peripheral airspace density over the anterior right upper lobe. Calcified granuloma right lower lobe. Airways are within normal. Upper Abdomen: Small right adrenal adenoma. Musculoskeletal: Mild degenerate change of the spine. Small hemangioma over the lower thoracic spine. Review of the MIP images confirms the above findings. IMPRESSION: Moderate burden of bilateral pulmonary emboli right greater than left. No evidence of right heart strain. Small tear of infarct over the posterior right lower lobe versus atelectasis. Small amount right pleural fluid. Prominent right hilar lymph node measuring 1.6 cm by short axis likely reactive. Small right adrenal adenoma. Critical Value/emergent  results were called by telephone at the time of interpretation on 06/14/2016 at 6:14 pm to Dr. Janetta Hora , who verbally acknowledged these results. Electronically Signed   By: Marin Olp M.D.   On: 06/14/2016 18:14    Scheduled Meds: . amLODipine  5 mg Oral Daily  . sodium chloride flush  3 mL Intravenous Q12H   Continuous Infusions: . heparin 1,850 Units/hr (06/15/16 1153)  . lactated ringers 75 mL/hr at 06/15/16 1608     LOS: 1 day   CHIU, Orpah Melter, MD [facility] Pager [phone]  If 7PM-7AM, please contact night-coverage [email or web address] Password [facility] 06/15/2016, 4:52 PM

## 2016-06-15 NOTE — Progress Notes (Signed)
ANTICOAGULATION CONSULT NOTE - Follow Up Consult  Pharmacy Consult for Heparin Indication: pulmonary embolus  No Known Allergies  Patient Measurements: Height: 5\' 9"  (175.3 cm) Weight: 209 lb 6.4 oz (95 kg) IBW/kg (Calculated) : 70.7 Heparin Dosing Weight:   Vital Signs: Temp: 101 F (38.3 C) (11/27 2236) Temp Source: Oral (11/27 2236) BP: 182/108 (11/27 2236) Pulse Rate: 101 (11/27 2236)  Labs:  Recent Labs  06/14/16 1642 06/14/16 1650 06/15/16 0048  HGB 14.8  --  13.7  HCT 42.3  --  39.9  PLT 166  --  178  APTT  --  29  --   LABPROT  --  14.2 14.4  INR  --  1.10 1.12  HEPARINUNFRC  --   --  0.17*  CREATININE 1.34*  --  1.17    Estimated Creatinine Clearance: 82.1 mL/min (by C-G formula based on SCr of 1.17 mg/dL).   Medications:  Infusions:  . heparin 1,500 Units/hr (06/14/16 1919)  . lactated ringers 75 mL/hr at 06/15/16 0240    Assessment: Patient with low heparin level.  No issues with heparin drip per RN.  Goal of Therapy:  Heparin level 0.3-0.7 units/ml Monitor platelets by anticoagulation protocol: Yes   Plan:  Increase heparin to 1700 units/hr Recheck heparin level at [place], [place] 06/15/2016,3:03 AM

## 2016-06-15 NOTE — Progress Notes (Signed)
ANTICOAGULATION CONSULT NOTE - follow-up Consult  Pharmacy Consult for IV heparin Indication: PE  No Known Allergies  Patient Measurements: Height: 5\' 9"  (175.3 cm) Weight: 209 lb 6.4 oz (95 kg) IBW/kg (Calculated) : 70.7 Heparin Dosing Weight: 91.1kg  Vital Signs: Temp: 98.9 F (37.2 C) (11/28 1500) Temp Source: Oral (11/28 1500) BP: 168/98 (11/28 1500) Pulse Rate: 86 (11/28 1500)  Labs:  Recent Labs  06/14/16 1642 06/14/16 1650 06/15/16 0048 06/15/16 1002 06/15/16 1803  HGB 14.8  --  13.7  --   --   HCT 42.3  --  39.9  --   --   PLT 166  --  178  --   --   APTT  --  29  --   --   --   LABPROT  --  14.2 14.4  --   --   INR  --  1.10 1.12  --   --   HEPARINUNFRC  --   --  0.17* 0.28* 0.32  CREATININE 1.34*  --  1.17  --   --     Estimated Creatinine Clearance: 82.1 mL/min (by C-G formula based on SCr of 1.17 mg/dL).   Medical History: Past Medical History:  Diagnosis Date  . Hypertension 1997   has never taken medications  . Tobacco dependence     Assessment: 61 yoM presents with worsening SOB and right-sided pain, found to have bilateral PE, no evidence of right heart strain. Pharmacy consulted to start IV heparin.  No anticoagulants or antiplatelets PTA. No issues with CBC.  SCr elevated, CrCl ~73 ml/min.    11/28: LE dopplerse: DVT RLE  Today, 06/15/2016:  Heparin level now therapeutic on 1850 units/hr  CBC WNL  No complications of therapy noted  Goal of Therapy:  Heparin level 0.3-0.7 units/ml Monitor platelets by anticoagulation protocol: Yes   Plan:   Continue heparin @ 1850 units/hr  Repeat heparin level in  6h to confirm therapeutic dose  Daily heparin level and CBC  Follow-up long-term plans for anticoagulation  Leone Haven, PharmD 06/15/2016 6:41 PM

## 2016-06-15 NOTE — Progress Notes (Signed)
ANTICOAGULATION CONSULT NOTE - follow-up Consult  Pharmacy Consult for IV heparin Indication: PE  No Known Allergies  Patient Measurements: Height: 5\' 9"  (175.3 cm) Weight: 209 lb 6.4 oz (95 kg) IBW/kg (Calculated) : 70.7 Heparin Dosing Weight: 91.1kg  Vital Signs: Temp: 97.9 F (36.6 C) (11/28 0542) Temp Source: Oral (11/28 0542) BP: 157/99 (11/28 0542) Pulse Rate: 87 (11/28 0542)  Labs:  Recent Labs  06/14/16 1642 06/14/16 1650 06/15/16 0048 06/15/16 1002  HGB 14.8  --  13.7  --   HCT 42.3  --  39.9  --   PLT 166  --  178  --   APTT  --  29  --   --   LABPROT  --  14.2 14.4  --   INR  --  1.10 1.12  --   HEPARINUNFRC  --   --  0.17* 0.28*  CREATININE 1.34*  --  1.17  --     Estimated Creatinine Clearance: 82.1 mL/min (by C-G formula based on SCr of 1.17 mg/dL).   Medical History: Past Medical History:  Diagnosis Date  . Hypertension 1997   has never taken medications  . Tobacco dependence     Assessment: 66 yoM presents with worsening SOB and right-sided pain, found to have bilateral PE, no evidence of right heart strain. Pharmacy consulted to start IV heparin.  No anticoagulants or antiplatelets PTA. No issues with CBC.  SCr elevated, CrCl ~73 ml/min.    11/28: LE dopplerse: DVT RLE  Today, 06/15/2016:  Heparin level subtherapeutic on 1700 units/hr  CBC WNL  Goal of Therapy:  Heparin level 0.3-0.7 units/ml Monitor platelets by anticoagulation protocol: Yes   Plan:   Increase heparin to 1850 units/hr  Check 6h heparin level  Daily heparin level and CBC  Follow-up long-term plans for anticoagulation  Doreene Eland, PharmD, BCPS.   Pager: [phone] 06/15/2016 11:04 AM

## 2016-06-15 NOTE — Progress Notes (Signed)
  Echocardiogram 2D Echocardiogram has been performed.  Jennette Dubin 06/15/2016, 2:49 PM

## 2016-06-15 NOTE — Progress Notes (Signed)
**  Preliminary report by tech**  Bilateral lower extremity venous duplex complete. There is evidence of deep vein thrombosis involving the external iliac vein, the common femoral vein, the proximal portion of the profunda femoral vein, the proximal and distal portions of the femoral vein, and the popliteal vein of the left lower extremity. Unable to visualize the distal inferior vena cava due to bowel gas. There is no evidence of superficial vein thrombosis involving the left lower extremity. There is no evidence of deep or superficial vein thrombosis involving the right lower extremity. There is no evidence of Baker's cysts bilaterally. Results were given to Dr. Wyline Copas.   06/15/16 10:24 AM Mitchell Cooper RVT

## 2016-06-16 ENCOUNTER — Encounter (HOSPITAL_COMMUNITY)
Admission: EM | Disposition: A | Discharge status: Home / Self Care | Payer: Self-pay | Source: Home / Self Care | Attending: Internal Medicine

## 2016-06-16 DIAGNOSIS — I10 Essential (primary) hypertension: Secondary | ICD-10-CM

## 2016-06-16 DIAGNOSIS — R739 Hyperglycemia, unspecified: Secondary | ICD-10-CM

## 2016-06-16 DIAGNOSIS — I82492 Acute embolism and thrombosis of other specified deep vein of left lower extremity: Secondary | ICD-10-CM

## 2016-06-16 DIAGNOSIS — I82402 Acute embolism and thrombosis of unspecified deep veins of left lower extremity: Secondary | ICD-10-CM

## 2016-06-16 HISTORY — PX: PERIPHERAL VASCULAR CATHETERIZATION: SHX172C

## 2016-06-16 LAB — POCT ACTIVATED CLOTTING TIME
Activated Clotting Time: 120 seconds
Activated Clotting Time: 180 seconds

## 2016-06-16 LAB — CBC
HCT: 40.7 % (ref 39.0–52.0)
Hemoglobin: 13.8 g/dL (ref 13.0–17.0)
MCH: 27.5 pg (ref 26.0–34.0)
MCHC: 33.9 g/dL (ref 30.0–36.0)
MCV: 81.2 fL (ref 78.0–100.0)
PLATELETS: 193 10*3/uL (ref 150–400)
RBC: 5.01 MIL/uL (ref 4.22–5.81)
RDW: 14 % (ref 11.5–15.5)
WBC: 7.9 10*3/uL (ref 4.0–10.5)

## 2016-06-16 LAB — HEMOGLOBIN A1C
Hgb A1c MFr Bld: 5.5 % (ref 4.8–5.6)
MEAN PLASMA GLUCOSE: 111 mg/dL

## 2016-06-16 LAB — HEPARIN LEVEL (UNFRACTIONATED): Heparin Unfractionated: 0.25 IU/mL — ABNORMAL LOW (ref 0.30–0.70)

## 2016-06-16 SURGERY — LOWER EXTREMITY VENOGRAPHY
Anesthesia: LOCAL | Laterality: Left

## 2016-06-16 MED ORDER — HEPARIN SODIUM (PORCINE) 1000 UNIT/ML IJ SOLN
INTRAMUSCULAR | Status: AC
  Filled 2016-06-16: qty 1

## 2016-06-16 MED ORDER — SODIUM CHLORIDE 0.9 % IV SOLN: 0.2500 mg/h | INTRAVENOUS | Status: DC

## 2016-06-16 MED ORDER — SODIUM CHLORIDE 0.9 % IV SOLN
10.0000 mg | INTRAVENOUS | Status: AC
  Administered 2016-06-16: 10 mg via INTRAVENOUS
  Filled 2016-06-16: qty 10

## 2016-06-16 MED ORDER — MIDAZOLAM HCL 2 MG/2ML IJ SOLN
INTRAMUSCULAR | Status: AC
  Filled 2016-06-16: qty 2

## 2016-06-16 MED ORDER — DOCUSATE SODIUM 100 MG PO CAPS
100.0000 mg | ORAL_CAPSULE | Freq: Every day | ORAL | Status: DC
  Administered 2016-06-17: 100 mg via ORAL
  Filled 2016-06-16: qty 1

## 2016-06-16 MED ORDER — HYDROMORPHONE HCL 1 MG/ML IJ SOLN
INTRAMUSCULAR | Status: DC | PRN
  Administered 2016-06-16: 2 mg via INTRAVENOUS

## 2016-06-16 MED ORDER — HYDRALAZINE HCL 20 MG/ML IJ SOLN
INTRAMUSCULAR | Status: AC
  Filled 2016-06-16: qty 1

## 2016-06-16 MED ORDER — HEPARIN (PORCINE) IN NACL 2-0.9 UNIT/ML-% IJ SOLN
INTRAMUSCULAR | Status: AC
  Filled 2016-06-16: qty 1000

## 2016-06-16 MED ORDER — PHENOL 1.4 % MT LIQD: 1.0000 | OROMUCOSAL | Status: DC | PRN

## 2016-06-16 MED ORDER — FENTANYL CITRATE (PF) 100 MCG/2ML IJ SOLN
INTRAMUSCULAR | Status: AC
  Filled 2016-06-16: qty 2

## 2016-06-16 MED ORDER — MORPHINE SULFATE (PF) 2 MG/ML IV SOLN: 2.0000 mg | INTRAVENOUS | Status: DC | PRN

## 2016-06-16 MED ORDER — MIDAZOLAM HCL 2 MG/2ML IJ SOLN
INTRAMUSCULAR | Status: DC | PRN
  Administered 2016-06-16 (×3): 2 mg via INTRAVENOUS

## 2016-06-16 MED ORDER — LABETALOL HCL 5 MG/ML IV SOLN: 10.0000 mg | INTRAVENOUS | Status: DC | PRN

## 2016-06-16 MED ORDER — ONDANSETRON HCL 4 MG/2ML IJ SOLN: 4.0000 mg | Freq: Four times a day (QID) | INTRAMUSCULAR | Status: DC | PRN

## 2016-06-16 MED ORDER — KETOROLAC TROMETHAMINE 30 MG/ML IJ SOLN
30.0000 mg | INTRAMUSCULAR | Status: DC
  Filled 2016-06-16: qty 1

## 2016-06-16 MED ORDER — HEPARIN SODIUM (PORCINE) 1000 UNIT/ML IJ SOLN
INTRAMUSCULAR | Status: AC
Start: 2016-06-16 — End: 2016-06-16
  Filled 2016-06-16: qty 1

## 2016-06-16 MED ORDER — FENTANYL CITRATE (PF) 100 MCG/2ML IJ SOLN
INTRAMUSCULAR | Status: DC | PRN
  Administered 2016-06-16 (×3): 50 ug via INTRAVENOUS

## 2016-06-16 MED ORDER — HYDROMORPHONE HCL 1 MG/ML IJ SOLN
INTRAMUSCULAR | Status: AC
  Filled 2016-06-16: qty 1

## 2016-06-16 MED ORDER — METOPROLOL TARTRATE 5 MG/5ML IV SOLN
2.0000 mg | INTRAVENOUS | Status: DC | PRN
  Administered 2016-06-17: 5 mg via INTRAVENOUS
  Filled 2016-06-16: qty 5

## 2016-06-16 MED ORDER — SODIUM CHLORIDE 0.9 % IV SOLN
INTRAVENOUS | Status: DC
  Administered 2016-06-16 – 2016-06-17 (×2): via INTRAVENOUS

## 2016-06-16 MED ORDER — ALUM & MAG HYDROXIDE-SIMETH 200-200-20 MG/5ML PO SUSP: 15.0000 mL | ORAL | Status: DC | PRN

## 2016-06-16 MED ORDER — LIDOCAINE HCL (PF) 1 % IJ SOLN
INTRAMUSCULAR | Status: DC | PRN
  Administered 2016-06-16: 20 mL

## 2016-06-16 MED ORDER — HEPARIN (PORCINE) IN NACL 100-0.45 UNIT/ML-% IJ SOLN
1950.0000 [IU]/h | INTRAMUSCULAR | Status: DC
  Administered 2016-06-16 – 2016-06-17 (×2): 1950 [IU]/h via INTRAVENOUS
  Filled 2016-06-16 (×2): qty 250

## 2016-06-16 MED ORDER — GUAIFENESIN-DM 100-10 MG/5ML PO SYRP
15.0000 mL | ORAL_SOLUTION | ORAL | Status: DC | PRN
Start: 2016-06-16 — End: 2016-06-17

## 2016-06-16 MED ORDER — HEPARIN SODIUM (PORCINE) 1000 UNIT/ML IJ SOLN
INTRAMUSCULAR | Status: DC | PRN
  Administered 2016-06-16: 4000 [IU] via INTRAVENOUS
  Administered 2016-06-16: 2000 [IU] via INTRAVENOUS

## 2016-06-16 SURGICAL SUPPLY — 22 items
BALLN ATLAS 16X40X75 (BALLOONS) ×2
BALLN MUSTANG 10.0X40 75 (BALLOONS) ×2
BALLN MUSTANG 8X80X75 (BALLOONS) ×2
BALLN MUSTANG 9X80X75 (BALLOONS) ×2
BALLOON ATLAS 16X40X75 (BALLOONS) ×1 IMPLANT
BALLOON MUSTANG 10.0X40 75 (BALLOONS) ×1 IMPLANT
BALLOON MUSTANG 8X80X75 (BALLOONS) ×1 IMPLANT
BALLOON MUSTANG 9X80X75 (BALLOONS) ×1 IMPLANT
CATH ANGIO 5F BER2 100CM (CATHETERS) ×2 IMPLANT
CATH VISIONS PV .035 IVUS (CATHETERS) ×2 IMPLANT
COVER PRB 48X5XTLSCP FOLD TPE (BAG) ×1 IMPLANT
COVER PROBE 5X48 (BAG) ×1
KIT ENCORE 26 ADVANTAGE (KITS) ×2 IMPLANT
KIT MICROINTRODUCER STIFF 5F (SHEATH) ×2 IMPLANT
KIT PV (KITS) ×2 IMPLANT
SET ZELANTE DVT THROMB (CATHETERS) ×2 IMPLANT
SHEATH PINNACLE 8F 10CM (SHEATH) ×2 IMPLANT
STENT WALLSTENT 10X42X75 (Permanent Stent) ×2 IMPLANT
TRANSDUCER W/STOPCOCK (MISCELLANEOUS) ×2 IMPLANT
TRAY PV CATH (CUSTOM PROCEDURE TRAY) ×2 IMPLANT
WIRE AMPLATZ SS-J .035X260CM (WIRE) ×2 IMPLANT
WIRE BENTSON .035X145CM (WIRE) ×2 IMPLANT

## 2016-06-16 NOTE — Progress Notes (Signed)
PROGRESS NOTE    Mitchell Cooper  KYH:[id number] DOB: 12-17-1960 DOA: 06/14/2016 PCP: No primary care provider on file.    Brief Narrative:  55 y.o. male with medical history significant of HTN, never treated, no PCP.  Friday night noticed a little SOB.  Had a mild cold and thought it was related.  Saturday and particularly Saturday night noticed pain in side and back - ?muscle spasms.  Very hard to take a deep breath, pain with deep breathing and bending over.  Thought maybe a touch of PNA.  All day Sunday, drinking water, took [employer] and felt a little better.  This AM, maybe a little better but still not right so decided to come to the ER.  +SOB.  Slight nasal congestion, mouth breathing for now.  +cough, hard to cough anything up.  Remote left knee surgery (about 10 years ago), has intermittent left knee/leg swelling.  +tobacco - smokes on the weekend when he drinks.  1 pack lasts 2-3 weeks.  No weight loss.  No fevers.   3 hour drive to [place], did not stop en route.  Also commutes 1 1/2 hours each way to work daily.    Patient found to have bilateral PE on chest CT without right heart strain. LE dopplers with findings of significant clot burden in LLE. Discussed with Vascular Surgery who suggests possible surgery recommends transfer to [facility] for further eval and treatment.  Assessment & Plan:   Principal Problem:   Pulmonary embolism (HCC) Active Problems:   Uncontrolled hypertension   CKD (chronic kidney disease) stage 3, GFR 30-59 ml/min   Hyperglycemia   Tobacco dependence   1. Bilateral PE 1. CT scan reviewed. No evidence of right heart strain 2. 2d echo done, no signs of RV dysfunction 3. Trop neg 4. Not tachycardic, BP mildly hypertensive 5. Continue heparin gtt for now 6. Will need 6 month minimum anticoagulation; will need discussion with CM to identify best agent base on insurance preference at discharge 2. HTN 1. Bp suboptimal 2. Currently on norvasc and  PRN hydralazine 3. Continue to titrate bp meds as tolerated 3. LLE DVT 1. Extensive LLE DVT 2. Discussed case with vascular surgery (Dr. Russella Dar) who recommends transfer to [facility] for further evaluation and thrombectomy surgery 3. Will transfer 4. CKD 3 1. Cr has improved and essentially WNL now 2. Will continue close monitoring  5. Tobacco abuse 1. Cessation counseling provided  DVT prophylaxis: heparin gtt Code Status: Full Family Communication: Pt in room, family at bedside Disposition Plan: Transfer to [facility] for vascular surgery procedure; once completed will need CM evaluation regarding insurance company to determine discharge medication for anticoagulation.   Consultants:   Dr. Russella Dar  Procedures:   Thrombectomy 11/29  Antimicrobials: Anti-infectives    None      Subjective: Afebrile, stable and w/o SOB. Reports very mild chest discomfort with deep inspiration.   Objective: Vitals:   06/15/16 1500 06/15/16 2102 06/16/16 0509 06/16/16 1011  BP: (!) 168/98 (!) 176/102 (!) 170/104 (!) 156/95  Pulse: 86 93 87   Resp: 20 (!) 22 20   Temp: 98.9 F (37.2 C) 98.9 F (37.2 C) 98.5 F (36.9 C)   TempSrc: Oral Oral Oral   SpO2: 96% 96% 97%   Weight:      Height:        Intake/Output Summary (Last 24 hours) at 06/16/16 1526 Last data filed at 06/16/16 0600  Gross per 24 hour  Intake  2300.16 ml  Output                0 ml  Net          2300.16 ml   Filed Weights   06/14/16 1811 06/14/16 2236  Weight: 97.5 kg (215 lb) 95 kg (209 lb 6.4 oz)    Examination:  General exam: Appears calm and comfortable; no SOB and good O2 sat on RA. Respiratory system: Clear to auscultation. Respiratory effort normal. Cardiovascular system: S1 & S2 heard, RRR. Gastrointestinal system: Abdomen is nondistended, soft and nontender. No organomegaly or masses felt. Normal bowel sounds heard. Central nervous system: Alert and oriented. No focal neurological  deficits. Extremities: Symmetric 5 x 5 power, LLE with increased edema and induration on exam Skin: No rashes, lesions or ulcers Psychiatry: Judgement and insight appear normal. Mood & affect appropriate.   Data Reviewed: I have personally reviewed following labs and imaging studies  CBC:  Recent Labs Lab 06/14/16 1642 06/15/16 0048 06/16/16 0517  WBC 9.8 8.9 7.9  HGB 14.8 13.7 13.8  HCT 42.3 39.9 40.7  MCV 80.1 81.1 81.2  PLT 166 178 326   Basic Metabolic Panel:  Recent Labs Lab 06/14/16 1642 06/15/16 0048  NA 138 137  K 3.6 3.4*  CL 104 105  CO2 23 25  GLUCOSE 121* 118*  BUN 15 14  CREATININE 1.34* 1.17  CALCIUM 9.0 8.7*   GFR: Estimated Creatinine Clearance: 82.1 mL/min (by C-G formula based on SCr of 1.17 mg/dL).  Coagulation Profile:  Recent Labs Lab 06/14/16 1650 06/15/16 0048  INR 1.10 1.12   HbA1C:  Recent Labs  06/14/16 1642  HGBA1C 5.5    Radiology Studies: Dg Chest 2 View  Result Date: 06/14/2016 CLINICAL DATA:  Shortness of breath on Saturday. Congestive cough with green -yellow mucus. EXAM: CHEST  2 VIEW COMPARISON:  06/22/2010 FINDINGS: Size is normal. There is mild elevation of the right hemidiaphragm. There is perihilar peribronchial thickening. At the lung bases bilaterally, there is mild infiltrate or atelectasis/scarring. No evidence for pulmonary edema. IMPRESSION: 1. Bronchitic changes. 2. Bibasilar atelectasis or early infiltrates. Electronically Signed   By: Nolon Nations M.D.   On: 06/14/2016 17:09   Ct Angio Chest Pe W/cm &/or Wo Cm  Result Date: 06/14/2016 CLINICAL DATA:  Shortness of breath three days worse with movement and deep breathing. Pain right-sided. Hypertension. Central chest tightness. EXAM: CT ANGIOGRAPHY CHEST WITH CONTRAST TECHNIQUE: Multidetector CT imaging of the chest was performed using the standard protocol during bolus administration of intravenous contrast. Multiplanar CT image reconstructions and MIPs  were obtained to evaluate the vascular anatomy. CONTRAST:  100 mL Isovue 370 IV. COMPARISON:  Chest x-ray 06/14/2016 FINDINGS: Cardiovascular: Heart is normal in size. Thoracic aorta is within normal. Common takeoff of the right brachiocephalic and left common carotid arteries. Moderate burden of bilateral pulmonary emboli right greater than left involving the proximal lobar arteries. RV/LV ratio is 38.1/ 45.1= 0.84. Mediastinum/Nodes: 1.6 cm right hilar lymph node. No mediastinal adenopathy. Remaining mediastinal structures are within normal. Lungs/Pleura: Lungs are adequately inflated and demonstrate linear atelectasis over the left base. There is mild opacification over the posterior right lower lobe with tiny amount of right pleural fluid. Subtle patchy peripheral airspace density over the anterior right upper lobe. Calcified granuloma right lower lobe. Airways are within normal. Upper Abdomen: Small right adrenal adenoma. Musculoskeletal: Mild degenerate change of the spine. Small hemangioma over the lower thoracic spine. Review of the MIP images confirms  the above findings. IMPRESSION: Moderate burden of bilateral pulmonary emboli right greater than left. No evidence of right heart strain. Small tear of infarct over the posterior right lower lobe versus atelectasis. Small amount right pleural fluid. Prominent right hilar lymph node measuring 1.6 cm by short axis likely reactive. Small right adrenal adenoma. Critical Value/emergent results were called by telephone at the time of interpretation on 06/14/2016 at 6:14 pm to Dr. Janetta Hora , who verbally acknowledged these results. Electronically Signed   By: Marin Olp M.D.   On: 06/14/2016 18:14    Scheduled Meds: . [MAR Hold] amLODipine  5 mg Oral Daily  . [MAR Hold] sodium chloride flush  3 mL Intravenous Q12H   Continuous Infusions: . heparin 1,950 Units/hr (06/16/16 1205)  . lactated ringers 75 mL/hr at 06/16/16 0351     LOS: 2 days    Barton Dubois, MD [facility] Pager [phone]  If 7PM-7AM, please contact night-coverage [email or web address] Password [email or web address] 06/16/2016, 3:26 PM

## 2016-06-16 NOTE — Progress Notes (Signed)
Pt to [facility] for procedure via stretcher. Consent signed and Dr Paulina Fusi in to talk to pt and family.

## 2016-06-16 NOTE — Progress Notes (Signed)
ANTICOAGULATION CONSULT NOTE - Follow Up Consult  Pharmacy Consult for Heparin Indication: pulmonary embolus  No Known Allergies  Patient Measurements: Height: 5\' 9"  (175.3 cm) Weight: 209 lb 6.4 oz (95 kg) IBW/kg (Calculated) : 70.7 Heparin Dosing Weight:   Vital Signs: Temp: 98.9 F (37.2 C) (11/28 2102) Temp Source: Oral (11/28 2102) BP: 176/102 (11/28 2102) Pulse Rate: 93 (11/28 2102)  Labs:  Recent Labs  06/14/16 1642 06/14/16 1650  06/15/16 0048 06/15/16 1002 06/15/16 1803 06/15/16 2303  HGB 14.8  --   --  13.7  --   --   --   HCT 42.3  --   --  39.9  --   --   --   PLT 166  --   --  178  --   --   --   APTT  --  29  --   --   --   --   --   LABPROT  --  14.2  --  14.4  --   --   --   INR  --  1.10  --  1.12  --   --   --   HEPARINUNFRC  --   --   < > 0.17* 0.28* 0.32 0.37  CREATININE 1.34*  --   --  1.17  --   --   --   < > = values in this interval not displayed.  Estimated Creatinine Clearance: 82.1 mL/min (by C-G formula based on SCr of 1.17 mg/dL).   Medications:  Infusions:  . heparin 1,850 Units/hr (06/15/16 2347)  . lactated ringers 75 mL/hr at 06/16/16 0351    Assessment: Patient with heparin level at goal.  No heparin issues noted.  Goal of Therapy:  Heparin level 0.3-0.7 units/ml Monitor platelets by anticoagulation protocol: Yes   Plan:  Continue heparin drip at current rate Recheck level with AM labs  Tyler Deis, [place] 06/16/2016,4:12 AM

## 2016-06-16 NOTE — Progress Notes (Signed)
Pt received from [facility] via [facility].  Pt alert and oriented , denies any pain at this time.  Pt having some discomfort when taking deep breathe, no distress noted at this time.  IV and IV Heparin infusing. Pt waiting to talk to MD before procedure.

## 2016-06-16 NOTE — Op Note (Addendum)
Patient name: SAAGAR TORTORELLA MRN: [id number] DOB: Jul 22, 1960 Sex: male  06/14/2016 - 06/16/2016 Pre-operative Diagnosis: Left leg DVT, PE Post-operative diagnosis:  Same Surgeon:  Annamarie Major Procedure Performed:  1.  Ultrasound-guided access, left popliteal vein  2.  Conscious sedation 100 minutes  3.  Left leg venogram  4.  Inferior venacavogram  5.  Mechanical thrombectomy, left popliteal, superficial femoral, common femoral, external iliac, and common iliac vein  6.  TPA thrombolysis, left popliteal, superficial femoral, common femoral, external iliac, and common femoral vein  7.  Angioplasty, popliteal left superficial femoral and common femoral vein  8.  Stent, left common iliac and external iliac vein  9.  IVUS, Left popliteal, femoral, common femoral, external iliac, common iliac vein and IVC   Indications:  The patient presented with a PE and was found to have significant acute thrombus within the left lower extremity.  He comes in today for further evaluation and possible intervention  Procedure:  The patient was identified in the holding area and taken to room 8.  The patient was then placed supine on the table and prepped and draped in the usual sterile fashion.  A time out was called.  Conscious sedation was performed with the use of IV fentanyl and Versed in a continuous physician and nurse monitoring.  Heart rate, blood pressure, and oxygen saturation continuously monitored.  The left popliteal vein was evaluated with ultrasound.  There did appear to be thrombus within the vein.  4% lidocaine was used, the left popliteal vein was cannulated with an 18-gauge needle.  A 035 wire was inserted followed by an 8 [place] sheath.  The patient continued to be heparinized.  Using a Berenstein 2 catheter and a Benson wire, the catheter was advanced into the inferior vena cava.  Inferior venacavogram was performed which confirmed that the disease was in the central venous system a  Amplatz superstiff wire was then advanced into the axillary vein.  Next,  IVUS was used to evaluate the popliteal, femoral, common femoral, external iliac, and common iliac vein.  There was acute and chronic thrombus within the vein and an obvious stenosis within the common iliac vein as the iliac artery crossed over.  The AngioJet was used to infuse TPA as a pulse spray into the left popliteal, superficial femoral, common femoral, external iliac, and common iliac vein.  100 cc of 10 mg of TPA reconstituted 100 cc bag was infused.  While we were waiting for this to work, angioplasty using a 8 x 100 balloon of the left popliteal, superficial femoral, common femoral, and external iliac and common iliac vein were performed.  The IVUS was then inserted and used to evaluate the left popliteal, superficial femoral, common femoral, external iliac, common iliac vein.  There was acute and chronic clot also there did appear to be compression of the vein from the iliac artery disease May Thurner).  AngioJet mechanical thrombectomy was then performed of the operative field, femoral, common femoral, external iliac, and common iliac vein.  A total of 200 seconds of AngioJet was performed.  The vein was then reevaluated with ultrasound and there still appeared to be chronic thrombus within the proximal femoral and common femoral vein.  Next, a Boston Wallstent, 18 by none was inserted and deployed beginning in the vena cava extending down into the external iliac vein.  This was molded using a 16 x 40 Atlas balloon.  Venogram of the leg was then performed that showed residual  chronic disease within the femoral and common femoral vein.  We therefore performed AngioJet of this area and additional time as well as ballooned it with a 10 x 40 balloon.  Follow-up venography was performed which showed in-line flow in the left leg with residual chronic disease present around the proximal superficial femoral and common femoral vein.  At  this point were satisfied with the results.  Catheters and wires were removed.  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  Mechanical and pharmacological from a lysis and thrombectomy were performed.  #2  angioplasty of the left femoral vein  #3  stenting of the left common and external iliac vein  #4  acute and chronic thrombus or identified throughout the iliac venous system as well as the femoral popliteal venous system.  This was nearly completely evacuated.  #5  patient will be stable for discharge tomorrow.  He will need to be discharged on a aspirin and anticoagulation.   Theotis Burrow, M.D. [facility] [place] Office: [phone] Pager:  [phone]

## 2016-06-16 NOTE — Progress Notes (Signed)
Received called from Dr. Charleston Ropes, pt is to be transported to [facility] cath lab, per Olive Bass, Pt stable a/o, wife at the bedside, pt will discuss procedure with MD at [facility]. Questions answered and address, pt acknowledged understanding. Pt prepared for procedure has been NPO since 8am . [facility] dispatched, and report given to [place]. SRP, RN

## 2016-06-16 NOTE — Progress Notes (Signed)
ANTICOAGULATION CONSULT NOTE - follow-up Consult  Pharmacy Consult for IV heparin Indication: PE  No Known Allergies  Patient Measurements: Height: 5\' 9"  (175.3 cm) Weight: 209 lb 6.4 oz (95 kg) IBW/kg (Calculated) : 70.7 Heparin Dosing Weight: 91.1kg  Vital Signs: Temp: 98.5 F (36.9 C) (11/29 0509) Temp Source: Oral (11/29 0509) BP: 170/104 (11/29 0509) Pulse Rate: 87 (11/29 0509)  Labs:  Recent Labs  06/14/16 1642 06/14/16 1650 06/15/16 0048  06/15/16 1803 06/15/16 2303 06/16/16 0517  HGB 14.8  --  13.7  --   --   --  13.8  HCT 42.3  --  39.9  --   --   --  40.7  PLT 166  --  178  --   --   --  193  APTT  --  29  --   --   --   --   --   LABPROT  --  14.2 14.4  --   --   --   --   INR  --  1.10 1.12  --   --   --   --   HEPARINUNFRC  --   --  0.17*  < > 0.32 0.37 0.25*  CREATININE 1.34*  --  1.17  --   --   --   --   < > = values in this interval not displayed.  Estimated Creatinine Clearance: 82.1 mL/min (by C-G formula based on SCr of 1.17 mg/dL).   Medical History: Past Medical History:  Diagnosis Date  . Hypertension 1997   has never taken medications  . Tobacco dependence     Assessment: 68 yoM presents with worsening SOB and right-sided pain, found to have bilateral PE, no evidence of right heart strain. Pharmacy consulted to start IV heparin.  No anticoagulants or antiplatelets PTA. No issues with CBC.  SCr elevated, CrCl ~73 ml/min.    11/28: LE dopplerse: DVT RLE  Today, 06/16/2016:  Heparin level subtherapeutic this am on 1850 units/hr.  Level previously therapeutic x 2   RN states IV site is in antecubital and Alaris pump is beeping and patient may not always ring for assistance to fix issue right away  CBC WNL  No complications of therapy noted.    Goal of Therapy:  Heparin level 0.3-0.7 units/ml Monitor platelets by anticoagulation protocol: Yes   Plan:   Increase heparin to 1950 units/hr  More conservative rate increase as  suspect issues with pump beeping/not infusing contributing to lower heparin level this am  Repeat heparin level in 6h   Daily heparin level and CBC  Follow-up long-term plans for anticoagulation  Patient to transfer to [facility] to be evaluated by VVS and likely procedure to remove clot  Doreene Eland, PharmD, BCPS.   Pager: [phone] 06/16/2016 7:57 AM

## 2016-06-16 NOTE — Progress Notes (Signed)
ANTICOAGULATION CONSULT NOTE - Follow Up Consult  Pharmacy Consult for Heparin Indication: pulmonary embolus and left leg DVT  No Known Allergies  Patient Measurements: Height: 5\' 9"  (175.3 cm) Weight: 209 lb 6.4 oz (95 kg) IBW/kg (Calculated) : 70.7 Heparin Dosing Weight: 91 kg  Vital Signs: BP: 200/129 (11/29 1830) Pulse Rate: 110 (11/29 1830)  Labs:  Recent Labs  06/14/16 1642 06/14/16 1650 06/15/16 0048  06/15/16 1803 06/15/16 2303 06/16/16 0517  HGB 14.8  --  13.7  --   --   --  13.8  HCT 42.3  --  39.9  --   --   --  40.7  PLT 166  --  178  --   --   --  193  APTT  --  29  --   --   --   --   --   LABPROT  --  14.2 14.4  --   --   --   --   INR  --  1.10 1.12  --   --   --   --   HEPARINUNFRC  --   --  0.17*  < > 0.32 0.37 0.25*  CREATININE 1.34*  --  1.17  --   --   --   --   < > = values in this interval not displayed.  Estimated Creatinine Clearance: 82.1 mL/min (by C-G formula based on SCr of 1.17 mg/dL).  Assessment:   36 yoM presented to [facility] on 06/14/16 with worsening SOB and right-sided pain, found to have bilateral PE, no evidence of right heart strain. Pharmacy consulted to start IV heparin.  No anticoagulants or antiplatelets PTA. 11/28: LE dopplers: DVT LLE with significant clot burden.  11/29: transferred to [facility] for vascular procedure. Now s/p mechanical thrombectomy, tPA thrombolysis, angioplasty and stent.  10 mg tPA given at 1655 via AngioJet.  Heparin drip resumed in the PV Lab at 1642 at previous rate of 1950 units/hr.  Total of 6000 units IV heparin boluses given in PV Lab.  Sheath out at 1818. RN reports no bleeding or hematoma.  Noted plan for discharge on aspirin and anticoagulation on 11/30.    Goal of Therapy:  Heparin level 0.3-0.7 units/ml Monitor platelets by anticoagulation protocol: Yes   Plan:   Continue heparin drip at 1950 units/hr.  Heparin level 11pm tonight, ~6 hrs after drip resumed.  Daily heparin level and  CBC while on heparin.  Will follow up for transition to oral anticoagulation on 11/30.  Arty Baumgartner, [place] Pager: [phone] 06/16/2016,7:56 PM

## 2016-06-16 NOTE — Consult Note (Signed)
         Consult Note  Patient name: Mitchell Cooper MRN: [id number] DOB: Jan 18, 1961 Sex: male  Consulting Physician:  [facility]  Reason for Consult:  Chief Complaint  Patient presents with  . Shortness of Breath  . Hypertension    HISTORY OF PRESENT ILLNESS: 55 yo male presented with SOB ands was found to have a PE.  He was also having side and back pain.  U/S revealed ilio-femoral DVT.  He is here for possible intervention  He suffers from HTN but does not regularly take meds.  He has had issues with his left leg in the past due to swelling and orthopedic issues.  Positive Tobacco  Past Medical History:  Diagnosis Date  . Hypertension 1997   has never taken medications  . Tobacco dependence     Past Surgical History:  Procedure Laterality Date  . KNEE SURGERY  approx. 1984   reconstructive surgery of left knee; arthroscopic procedure about 1997    Social History   Social History  . Marital status: Married    Spouse name: N/A  . Number of children: N/A  . Years of education: N/A   Occupational History  . Architect    Social History Main Topics  . Smoking status: Current Some Day Smoker    Types: Cigarettes  . Smokeless tobacco: Never Used     Comment: 1 pack lasts 2 to 3 weeks  . Alcohol use 3.6 oz/week    6 Cans of beer per week  . Drug use: No  . Sexual activity: Not on file   Other Topics Concern  . Not on file   Social History Narrative  . No narrative on file    Family History  Problem Relation Age of Onset  . CAD Mother 55    Allergies as of 06/14/2016  . (No Known Allergies)    No current facility-administered medications on file prior to encounter.    No current outpatient prescriptions on file prior to encounter.     REVIEW OF SYSTEMS: Cardiovascular: Pos CP/ SOB Pulmonary: No productive cough, asthma or wheezing. Neurologic: No weakness, paresthesias, aphasia, or amaurosis. No dizziness. Hematologic: No bleeding problems  or clotting disorders. Musculoskeletal: L LE Edema Gastrointestinal: No blood in stool or hematemesis Genitourinary: No dysuria or hematuria. Psychiatric:: No history of major depression. Integumentary: No rashes or ulcers. Constitutional: No fever or chills.  PHYSICAL EXAMINATION: General: The patient appears their stated age.  Vital signs are BP (!) 156/95   Pulse 87   Temp 98.5 F (36.9 C) (Oral)   Resp 20   Ht 5\' 9"  (1.753 m)   Wt 209 lb 6.4 oz (95 kg)   SpO2 97%   BMI 30.92 kg/m  Pulmonary: Respirations are non-labored HEENT:  No gross abnormalities Abdomen: Soft and non-tender  Musculoskeletal: There are no major deformities.   Neurologic: No focal weakness or paresthesias are detected, Skin: There are no ulcer or rashes noted. Psychiatric: The patient has normal affect. Cardiovascular: There is a regular rate and rhythm without significant murmur appreciated.  Diagnostic Studies: CTA shows PR w/o heart strain U/S with ioio-femoral DVT   Assessment:  DVT Plan: Discussed proceeding with venogram and possible thrombolysis and thrombecotmy.  All questions answered     V. Leia Alf, M.D. [facility] [place] Office: [phone] Pager:  [phone]

## 2016-06-17 ENCOUNTER — Other Ambulatory Visit: Payer: Self-pay | Admitting: *Deleted

## 2016-06-17 ENCOUNTER — Encounter (HOSPITAL_COMMUNITY): Payer: Self-pay | Admitting: Surgery

## 2016-06-17 DIAGNOSIS — I871 Compression of vein: Secondary | ICD-10-CM

## 2016-06-17 DIAGNOSIS — I2699 Other pulmonary embolism without acute cor pulmonale: Principal | ICD-10-CM

## 2016-06-17 DIAGNOSIS — Z9862 Peripheral vascular angioplasty status: Secondary | ICD-10-CM

## 2016-06-17 DIAGNOSIS — N183 Chronic kidney disease, stage 3 (moderate): Secondary | ICD-10-CM

## 2016-06-17 DIAGNOSIS — F17200 Nicotine dependence, unspecified, uncomplicated: Secondary | ICD-10-CM

## 2016-06-17 LAB — BASIC METABOLIC PANEL
Anion gap: 9 (ref 5–15)
BUN: 14 mg/dL (ref 6–20)
CHLORIDE: 106 mmol/L (ref 101–111)
CO2: 23 mmol/L (ref 22–32)
CREATININE: 1.46 mg/dL — AB (ref 0.61–1.24)
Calcium: 9 mg/dL (ref 8.9–10.3)
GFR calc non Af Amer: 53 mL/min — ABNORMAL LOW (ref >60)
Glucose, Bld: 114 mg/dL — ABNORMAL HIGH (ref 65–99)
Potassium: 3.5 mmol/L (ref 3.5–5.1)
Sodium: 138 mmol/L (ref 135–145)

## 2016-06-17 LAB — CBC
HEMATOCRIT: 41.6 % (ref 39.0–52.0)
Hemoglobin: 14.4 g/dL (ref 13.0–17.0)
MCH: 27.8 pg (ref 26.0–34.0)
MCHC: 34.6 g/dL (ref 30.0–36.0)
MCV: 80.3 fL (ref 78.0–100.0)
Platelets: 186 10*3/uL (ref 150–400)
RBC: 5.18 MIL/uL (ref 4.22–5.81)
RDW: 13.6 % (ref 11.5–15.5)
WBC: 11.2 10*3/uL — AB (ref 4.0–10.5)

## 2016-06-17 LAB — HEPARIN LEVEL (UNFRACTIONATED)
Heparin Unfractionated: 0.39 IU/mL (ref 0.30–0.70)
Heparin Unfractionated: 0.43 IU/mL (ref 0.30–0.70)

## 2016-06-17 MED ORDER — APIXABAN 5 MG PO TABS
10.0000 mg | ORAL_TABLET | Freq: Two times a day (BID) | ORAL | Status: DC
  Administered 2016-06-17: 10 mg via ORAL
  Filled 2016-06-17: qty 2

## 2016-06-17 MED ORDER — ASPIRIN EC 325 MG PO TBEC: 325.0000 mg | DELAYED_RELEASE_TABLET | Freq: Every day | ORAL | 0 refills | Status: DC

## 2016-06-17 MED ORDER — NICOTINE 7 MG/24HR TD PT24: 7.0000 mg | MEDICATED_PATCH | Freq: Every day | TRANSDERMAL | 0 refills | Status: DC | PRN

## 2016-06-17 MED ORDER — APIXABAN 5 MG PO TABS: 5.0000 mg | ORAL_TABLET | Freq: Two times a day (BID) | ORAL | Status: DC

## 2016-06-17 MED ORDER — APIXABAN 5 MG PO TABS: ORAL_TABLET | ORAL | 0 refills | Status: DC

## 2016-06-17 MED ORDER — AMLODIPINE BESYLATE 10 MG PO TABS: 10.0000 mg | ORAL_TABLET | Freq: Every day | ORAL | 0 refills | Status: DC

## 2016-06-17 MED FILL — Heparin Sodium (Porcine) 2 Unit/ML in Sodium Chloride 0.9%: INTRAMUSCULAR | Qty: 1000 | Status: AC

## 2016-06-17 NOTE — Discharge Instructions (Signed)
Information on my medicine - ELIQUIS (apixaban)  This medication education was reviewed with me or my healthcare representative as part of my discharge preparation.  The pharmacist that spoke with me during my hospital stay was:  Shyanna Klingel Kay, RPH  Why was Eliquis prescribed for you? Eliquis was prescribed to treat blood clots that may have been found in the veins of your legs (deep vein thrombosis) or in your lungs (pulmonary embolism) and to reduce the risk of them occurring again.  What do You need to know about Eliquis ? The starting dose is 10 mg (two 5 mg tablets) taken TWICE daily for the FIRST SEVEN (7) DAYS, then  the dose is reduced to ONE 5 mg tablet taken TWICE daily.  Eliquis may be taken with or without food.   Try to take the dose about the same time in the morning and in the evening. If you have difficulty swallowing the tablet whole please discuss with your pharmacist how to take the medication safely.  Take Eliquis exactly as prescribed and DO NOT stop taking Eliquis without talking to the doctor who prescribed the medication.  Stopping may increase your risk of developing a new blood clot.  Refill your prescription before you run out.  After discharge, you should have regular check-up appointments with your healthcare provider that is prescribing your Eliquis.    What do you do if you miss a dose? If a dose of ELIQUIS is not taken at the scheduled time, take it as soon as possible on the same day and twice-daily administration should be resumed. The dose should not be doubled to make up for a missed dose.  Important Safety Information A possible side effect of Eliquis is bleeding. You should call your healthcare provider right away if you experience any of the following: Bleeding from an injury or your nose that does not stop. Unusual colored urine (red or dark brown) or unusual colored stools (red or black). Unusual bruising for unknown reasons. A serious  fall or if you hit your head (even if there is no bleeding).  Some medicines may interact with Eliquis and might increase your risk of bleeding or clotting while on Eliquis. To help avoid this, consult your healthcare provider or pharmacist prior to using any new prescription or non-prescription medications, including herbals, vitamins, non-steroidal anti-inflammatory drugs (NSAIDs) and supplements.  This website has more information on Eliquis (apixaban): http://www.eliquis.com/eliquis/home  

## 2016-06-17 NOTE — Care Management Note (Addendum)
Case Management Note  Patient Details  Name: Mitchell Cooper MRN: [id number] Date of Birth: 04/08/1961  Subjective/Objective: Pt presented for Pulmonary Embolism. Pt planned for d/c today with Eliquis. Benefits check completed and will provide pt with 30 day free and co pay card. Pt aware of cost. Has discount cards in case co pay is different. Pt uses [facility] [address] and medication is available. Pt is aware to call the number on back of insurance card for Provider in the area. No further needs from CM @ this time.                   Action/Plan: ELIQUIS 5 MG BID 30/60 TAB  COVER- YES  CO-PAY- 100 %  TIER- 2 DRUG  PRIOR APPROVAL- NO  Expected Discharge Date:   (unknown)               Expected Discharge Plan:  Home/Self Care  In-House Referral:  NA  Discharge planning Services  NA  Post Acute Care Choice:  NA Choice offered to:  NA  DME Arranged:  N/A DME Agency:  NA  HH Arranged:  NA HH Agency:  NA  Status of Service:  Completed, signed off  If discussed at [place] of Stay Meetings, dates discussed:    Additional Comments:  Bethena Roys, RN 06/17/2016, 12:33 PM

## 2016-06-17 NOTE — Progress Notes (Signed)
ANTICOAGULATION CONSULT NOTE - Follow Up Consult  Pharmacy Consult for Heparin to Eliquis Indication: pulmonary embolus and left leg DVT  No Known Allergies  Patient Measurements: Height: 5\' 9"  (175.3 cm) Weight: 205 lb 8 oz (93.2 kg) IBW/kg (Calculated) : 70.7 Heparin Dosing Weight: 91 kg  Vital Signs: Temp: 98.3 F (36.8 C) (11/30 1200) Temp Source: Oral (11/30 1200) BP: 157/103 (11/30 1200) Pulse Rate: 95 (11/30 1200)  Labs:  Recent Labs  06/14/16 1642 06/14/16 1650 06/15/16 0048  06/16/16 0517 06/16/16 2259 06/17/16 0300 06/17/16 0953  HGB 14.8  --  13.7  --  13.8  --  14.4  --   HCT 42.3  --  39.9  --  40.7  --  41.6  --   PLT 166  --  178  --  193  --  186  --   APTT  --  29  --   --   --   --   --   --   LABPROT  --  14.2 14.4  --   --   --   --   --   INR  --  1.10 1.12  --   --   --   --   --   HEPARINUNFRC  --   --  0.17*  < > 0.25* 0.39 0.43  --   CREATININE 1.34*  --  1.17  --   --   --   --  1.46*  < > = values in this interval not displayed.  Estimated Creatinine Clearance: 65.2 mL/min (by C-G formula based on SCr of 1.46 mg/dL (H)).  Assessment:  75 yoM presented to transition to Eliquis for PE x 2 and DVT (s/p thrombectomy)    Goal of Therapy:   Monitor platelets by anticoagulation protocol: Yes   Plan:  Eliquis 10 mg po BID x 1 week then 5 mg po BID DC heparin and heparin labs  Thank you Anette Guarneri, PharmD [phone] 06/17/2016,12:29 PM

## 2016-06-17 NOTE — Progress Notes (Addendum)
    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  Physical Exam:  Cannulation site within the left popliteal vein is without hematoma. No significant swelling to the left leg Abdomen is soft    Assessment/Plan:  POD #1  From a vascular perspective, he is stable for discharge. I recommend an aspirin and full anticoagulation (NOAC is preferred) for at least 6 months I have him scheduled for follow-up with me in the office in one month with a repeat venous ultrasound Need to make sure his creatinine is OK after Angiojet yesterday   Lilliana Turner, [place] 06/17/2016 9:05 AM --  Vitals:   06/17/16 0600 06/17/16 0754  BP: (!) 188/111 (!) 154/99  Pulse:  (!) 116  Resp:  (!) 24  Temp:  99.4 F (37.4 C)    Intake/Output Summary (Last 24 hours) at 06/17/16 0905 Last data filed at 06/17/16 0700  Gross per 24 hour  Intake          1631.04 ml  Output             1675 ml  Net           -43.96 ml     Laboratory CBC    Component Value Date/Time   WBC 11.2 (H) 06/17/2016 0300   HGB 14.4 06/17/2016 0300   HCT 41.6 06/17/2016 0300   PLT 186 06/17/2016 0300    BMET    Component Value Date/Time   NA 137 06/15/2016 0048   K 3.4 (L) 06/15/2016 0048   CL 105 06/15/2016 0048   CO2 25 06/15/2016 0048   GLUCOSE 118 (H) 06/15/2016 0048   BUN 14 06/15/2016 0048   CREATININE 1.17 06/15/2016 0048   CALCIUM 8.7 (L) 06/15/2016 0048   GFRNONAA >60 06/15/2016 0048   GFRAA >60 06/15/2016 0048    COAG Lab Results  Component Value Date   INR 1.12 06/15/2016   INR 1.10 06/14/2016   No results found for: PTT  Antibiotics Anti-infectives    None       V. Leia Alf, M.D. [facility] [place] Office:  [phone] Pager:  [phone]

## 2016-06-17 NOTE — Discharge Summary (Signed)
Physician Discharge Summary  AZZAN BUTLER ZDG:[id number] DOB: [birth year] DOA: 06/14/2016  PCP: No primary care provider on file.  Admit date: 06/14/2016 Discharge date: 06/17/2016   Recommendations for Outpatient Follow-Up:   1. Cbc/bmp in 1 week 2. Patient to establish with PCP 3. ASA and NOAC for 6 months per vascular 4. Vascular follow up 1 month  Discharge Diagnosis:   Principal Problem:   Pulmonary embolism (HCC) Active Problems:   Uncontrolled hypertension   CKD (chronic kidney disease) stage 3, GFR 30-59 ml/min   Hyperglycemia   Tobacco dependence   May-Thurner syndrome   Discharge disposition:  Home  Discharge Condition: Improved.  Diet recommendation: Low sodium, heart healthy.   Wound care: None.   History of Present Illness:   Mitchell Cooper is a 55 y.o. male with medical history significant of HTN, never treated, no PCP.  Friday night noticed a little SOB.  Had a mild cold and thought it was related.  Saturday and particularly Saturday night noticed pain in side and back - ?muscle spasms.  Very hard to take a deep breath, pain with deep breathing and bending over.  Thought maybe a touch of PNA.  All day Sunday, drinking water, took [employer] and felt a little better.  This AM, maybe a little better but still not right so decided to come to the ER.  +SOB.  Slight nasal congestion, mouth breathing for now.  +cough, hard to cough anything up.  Remote left knee surgery (about 10 years ago), has intermittent left knee/leg swelling.  +tobacco - smokes on the weekend when he drinks.  1 pack lasts 2-3 weeks.  No weight loss.  No fevers.   3 hour drive to [place], did not stop en route.  Also commutes 1 1/2 hours each way to work daily.     Hospital Course by Problem:   1. Bilateral PE 1. CT scan reviewed. No evidence of right heart strain 2. 2d echo done, no signs of RV dysfunction 3. Trop neg 4. eliquis BID-- insurance to cover-- 10 mg BID x 14  days then 5 mg BID x 6 months 2. HTN 1. Bp suboptimal 2. Increase norvasc 3. Continue to titrate bp meds as tolerated 3. LLE DVT with May thurner syndrome 1. Extensive LLE DVT    S/p 4.  CKD 3 1. Cr stable- GFR > 60 2. Will continue close monitoring  5.  Tobacco abuse    1.  Cessation counseling provided    Medical Consultants:    Vascular   Discharge Exam:   Vitals:   06/17/16 0754 06/17/16 1200  BP: (!) 154/99 (!) 157/103  Pulse: (!) 116 95  Resp: (!) 24 20  Temp: 99.4 F (37.4 C) 98.3 F (36.8 C)   Vitals:   06/17/16 0500 06/17/16 0600 06/17/16 0754 06/17/16 1200  BP: (!) 157/120 (!) 188/111 (!) 154/99 (!) 157/103  Pulse: 94  (!) 116 95  Resp: 14  (!) 24 20  Temp: 98.8 F (37.1 C)  99.4 F (37.4 C) 98.3 F (36.8 C)  TempSrc: Oral  Oral Oral  SpO2: 94%  92% 94%  Weight: 93.2 kg (205 lb 8 oz)     Height:        Gen:  NAD- no CP , no SOB with walking   The results of significant diagnostics from this hospitalization (including imaging, microbiology, ancillary and laboratory) are listed below for reference.     Procedures and Diagnostic Studies:  Dg Chest 2 View  Result Date: 06/14/2016 CLINICAL DATA:  Shortness of breath on Saturday. Congestive cough with green -yellow mucus. EXAM: CHEST  2 VIEW COMPARISON:  06/22/2010 FINDINGS: Size is normal. There is mild elevation of the right hemidiaphragm. There is perihilar peribronchial thickening. At the lung bases bilaterally, there is mild infiltrate or atelectasis/scarring. No evidence for pulmonary edema. IMPRESSION: 1. Bronchitic changes. 2. Bibasilar atelectasis or early infiltrates. Electronically Signed   By: Nolon Nations M.D.   On: 06/14/2016 17:09   Ct Angio Chest Pe W/cm &/or Wo Cm  Result Date: 06/14/2016 CLINICAL DATA:  Shortness of breath three days worse with movement and deep breathing. Pain right-sided. Hypertension. Central chest tightness. EXAM: CT ANGIOGRAPHY CHEST WITH CONTRAST  TECHNIQUE: Multidetector CT imaging of the chest was performed using the standard protocol during bolus administration of intravenous contrast. Multiplanar CT image reconstructions and MIPs were obtained to evaluate the vascular anatomy. CONTRAST:  100 mL Isovue 370 IV. COMPARISON:  Chest x-ray 06/14/2016 FINDINGS: Cardiovascular: Heart is normal in size. Thoracic aorta is within normal. Common takeoff of the right brachiocephalic and left common carotid arteries. Moderate burden of bilateral pulmonary emboli right greater than left involving the proximal lobar arteries. RV/LV ratio is 38.1/ 45.1= 0.84. Mediastinum/Nodes: 1.6 cm right hilar lymph node. No mediastinal adenopathy. Remaining mediastinal structures are within normal. Lungs/Pleura: Lungs are adequately inflated and demonstrate linear atelectasis over the left base. There is mild opacification over the posterior right lower lobe with tiny amount of right pleural fluid. Subtle patchy peripheral airspace density over the anterior right upper lobe. Calcified granuloma right lower lobe. Airways are within normal. Upper Abdomen: Small right adrenal adenoma. Musculoskeletal: Mild degenerate change of the spine. Small hemangioma over the lower thoracic spine. Review of the MIP images confirms the above findings. IMPRESSION: Moderate burden of bilateral pulmonary emboli right greater than left. No evidence of right heart strain. Small tear of infarct over the posterior right lower lobe versus atelectasis. Small amount right pleural fluid. Prominent right hilar lymph node measuring 1.6 cm by short axis likely reactive. Small right adrenal adenoma. Critical Value/emergent results were called by telephone at the time of interpretation on 06/14/2016 at 6:14 pm to Dr. Janetta Hora , who verbally acknowledged these results. Electronically Signed   By: Marin Olp M.D.   On: 06/14/2016 18:14     Labs:   Basic Metabolic Panel:  Recent Labs Lab 06/14/16 1642  06/15/16 0048 06/17/16 0953  NA 138 137 138  K 3.6 3.4* 3.5  CL 104 105 106  CO2 23 25 23   GLUCOSE 121* 118* 114*  BUN 15 14 14   CREATININE 1.34* 1.17 1.46*  CALCIUM 9.0 8.7* 9.0   GFR Estimated Creatinine Clearance: 65.2 mL/min (by C-G formula based on SCr of 1.46 mg/dL (H)). Liver Function Tests: No results for input(s): AST, ALT, ALKPHOS, BILITOT, PROT, ALBUMIN in the last 168 hours. No results for input(s): LIPASE, AMYLASE in the last 168 hours. No results for input(s): AMMONIA in the last 168 hours. Coagulation profile  Recent Labs Lab 06/14/16 1650 06/15/16 0048  INR 1.10 1.12    CBC:  Recent Labs Lab 06/14/16 1642 06/15/16 0048 06/16/16 0517 06/17/16 0300  WBC 9.8 8.9 7.9 11.2*  HGB 14.8 13.7 13.8 14.4  HCT 42.3 39.9 40.7 41.6  MCV 80.1 81.1 81.2 80.3  PLT 166 178 193 186   Cardiac Enzymes: No results for input(s): CKTOTAL, CKMB, CKMBINDEX, TROPONINI in the last 168 hours. BNP: Invalid  input(s): POCBNP CBG: No results for input(s): GLUCAP in the last 168 hours. D-Dimer No results for input(s): DDIMER in the last 72 hours. Hgb A1c  Recent Labs  06/14/16 1642  HGBA1C 5.5   Lipid Profile No results for input(s): CHOL, HDL, LDLCALC, TRIG, CHOLHDL, LDLDIRECT in the last 72 hours. Thyroid function studies No results for input(s): TSH, T4TOTAL, T3FREE, THYROIDAB in the last 72 hours.  Invalid input(s): FREET3 Anemia work up No results for input(s): VITAMINB12, FOLATE, FERRITIN, TIBC, IRON, RETICCTPCT in the last 72 hours. Microbiology No results found for this or any previous visit (from the past 240 hour(s)).   Discharge Instructions:   Discharge Instructions    Diet - low sodium heart healthy    Complete by:  As directed    Discharge instructions    Complete by:  As directed    BMP/CBC 1 week   Increase activity slowly    Complete by:  As directed        Medication List    STOP taking these medications   ibuprofen 800 MG  tablet Commonly known as:  ADVIL,MOTRIN   methocarbamol 500 MG tablet Commonly known as:  ROBAXIN     TAKE these medications   amLODipine 10 MG tablet Commonly known as:  NORVASC Take 1 tablet (10 mg total) by mouth daily. Start taking on:  06/18/2016   apixaban 5 MG Tabs tablet Commonly known as:  ELIQUIS 10 mg BID x 14 doses then 5 mg BID   aspirin EC 325 MG tablet Take 1 tablet (325 mg total) by mouth daily.   nicotine 7 mg/24hr patch Commonly known as:  NICODERM CQ - dosed in mg/24 hr Place 1 patch (7 mg total) onto the skin daily as needed (tobacco dependence).      Follow-up Information    Patient has insurance and will call and arraange PCP follow up Follow up.        Annamarie Major, MD Follow up in 1 month(s).   Specialties:  Vascular Surgery, Cardiology Contact information: [address] [place] [address] [phone]            Time coordinating discharge: 35 min  Signed:  JESSICA Alison Stalling   [facility] 06/17/2016, 1:31 PM

## 2016-06-17 NOTE — Progress Notes (Signed)
ANTICOAGULATION CONSULT NOTE - Follow Up Consult  Pharmacy Consult for Heparin Indication: pulmonary embolus and left leg DVT  No Known Allergies  Patient Measurements: Height: 5\' 9"  (175.3 cm) Weight: 209 lb 6.4 oz (95 kg) IBW/kg (Calculated) : 70.7 Heparin Dosing Weight: 91 kg  Vital Signs: Temp: 99 F (37.2 C) (11/29 2055) Temp Source: Oral (11/29 2055) BP: 175/121 (11/29 2055) Pulse Rate: 77 (11/29 2055)  Labs:  Recent Labs  06/14/16 1642 06/14/16 1650 06/15/16 0048  06/15/16 2303 06/16/16 0517 06/16/16 2259  HGB 14.8  --  13.7  --   --  13.8  --   HCT 42.3  --  39.9  --   --  40.7  --   PLT 166  --  178  --   --  193  --   APTT  --  29  --   --   --   --   --   LABPROT  --  14.2 14.4  --   --   --   --   INR  --  1.10 1.12  --   --   --   --   HEPARINUNFRC  --   --  0.17*  < > 0.37 0.25* 0.39  CREATININE 1.34*  --  1.17  --   --   --   --   < > = values in this interval not displayed.  Estimated Creatinine Clearance: 82.1 mL/min (by C-G formula based on SCr of 1.17 mg/dL).  Assessment:  58 yoM presented to [facility] on 06/14/16 with worsening SOB and right-sided pain, found to have bilateral PE, no evidence of right heart strain. Pharmacy consulted to start IV heparin.  No anticoagulants or antiplatelets PTA. 11/28: LE dopplers: DVT LLE with significant clot burden.  11/29: transferred to [facility] for vascular procedure. Now s/p mechanical thrombectomy, tPA thrombolysis, angioplasty and stent.  10 mg tPA given at 1655 via AngioJet.  Heparin drip resumed in the PV Lab at 1642 at previous rate of 1950 units/hr.  Total of 6000 units IV heparin boluses given in PV Lab.  Sheath out at 1818. Noted plan for discharge on aspirin and anticoagulation on 11/30.  Heparin level therapeutic (0.39) on gtt at 1950 units/hr. No bleeding noted    Goal of Therapy:  Heparin level 0.3-0.7 units/ml Monitor platelets by anticoagulation protocol: Yes   Plan:   Continue heparin  drip at 1950 units/hr.  Daily heparin level and CBC while on heparin.  Will follow up for transition to oral anticoagulation on 11/30.  Sherlon Handing, PharmD, BCPS Clinical pharmacist, pager [phone] 06/17/2016,12:32 AM

## 2016-08-02 ENCOUNTER — Ambulatory Visit (INDEPENDENT_AMBULATORY_CARE_PROVIDER_SITE_OTHER)
Admit: 2016-08-02 | Discharge: 2016-08-02 | Disposition: A | Discharge status: Home / Self Care | Payer: Self-pay | Attending: Surgery | Admitting: Surgery

## 2016-08-02 ENCOUNTER — Ambulatory Visit (HOSPITAL_COMMUNITY)
Admission: RE | Admit: 2016-08-02 | Discharge: 2016-08-02 | Disposition: A | Discharge status: Home / Self Care | Payer: Self-pay | Source: Ambulatory Visit | Attending: Surgery | Admitting: Surgery

## 2016-08-02 DIAGNOSIS — Z9862 Peripheral vascular angioplasty status: Secondary | ICD-10-CM

## 2016-08-02 DIAGNOSIS — I871 Compression of vein: Secondary | ICD-10-CM | POA: Insufficient documentation

## 2016-08-03 ENCOUNTER — Encounter: Payer: Self-pay | Admitting: Surgery

## 2016-08-09 ENCOUNTER — Ambulatory Visit: Payer: BLUE CROSS/BLUE SHIELD | Admitting: Surgery

## 2016-08-09 ENCOUNTER — Ambulatory Visit (INDEPENDENT_AMBULATORY_CARE_PROVIDER_SITE_OTHER): Payer: Self-pay | Admitting: Surgery

## 2016-08-09 ENCOUNTER — Encounter: Payer: Self-pay | Admitting: Surgery

## 2016-08-09 VITALS — BP 140/95 | HR 96 | Temp 98.9°F | Resp 20 | Ht 69.0 in | Wt 213.7 lb

## 2016-08-09 DIAGNOSIS — I871 Compression of vein: Secondary | ICD-10-CM

## 2016-08-09 NOTE — Progress Notes (Signed)
[facility]ist of [place]  Patient name: Mitchell Cooper MRN: [id number] DOB: 1961/04/06 Sex: male   REASON FOR VISIT:    Follow up  HISOTRY OF PRESENT ILLNESS:    Mitchell Cooper is a 56 y.o. male who initially presented in November 2017 with a PE as well as a iliofemoral DVT.  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  He states that his symptoms are dramatically better in his left leg.  He no longer has cramping when he walks.  He can get out of bed and stretches leg without pain.   PAST MEDICAL HISTORY:   Past Medical History:  Diagnosis Date  . DVT (deep venous thrombosis) ([place])   . Hypertension 1997   has never taken medications  . Tobacco dependence      FAMILY HISTORY:   Family History  Problem Relation Age of Onset  . CAD Mother 32    SOCIAL HISTORY:   Social History  Substance Use Topics  . Smoking status: Current Some Day Smoker    Packs/day: 0.25    Types: Cigarettes  . Smokeless tobacco: Never Used     Comment: 1 pack lasts 2 to 3 weeks  . Alcohol use 3.6 oz/week    6 Cans of beer per week     ALLERGIES:   No Known Allergies   CURRENT MEDICATIONS:   Current Outpatient Prescriptions  Medication Sig Dispense Refill  . amLODipine (NORVASC) 10 MG tablet Take 1 tablet (10 mg total) by mouth daily. 30 tablet 0  . apixaban (ELIQUIS) 5 MG TABS tablet 10 mg BID x 14 doses then 5 mg BID 116 tablet 0  . aspirin EC 325 MG tablet Take 1 tablet (325 mg total) by mouth daily. 30 tablet 0  . nicotine (NICODERM CQ - DOSED IN MG/24 HR) 7 mg/24hr patch Place 1 patch (7 mg total) onto the skin daily as needed (tobacco dependence). 28 patch 0   No current facility-administered medications for this visit.     REVIEW OF SYSTEMS:   [X]  denotes positive finding, [ ]  denotes negative finding Cardiac  Comments:    Chest pain or chest pressure:    Shortness of breath upon exertion:    Short of breath when lying flat:    Irregular heart rhythm:        Vascular    Pain in calf, thigh, or hip brought on by ambulation:    Pain in feet at night that wakes you up from your sleep:     Blood clot in your veins:    Leg swelling:  x       Pulmonary    Oxygen at home:    Productive cough:     Wheezing:         Neurologic    Sudden weakness in arms or legs:     Sudden numbness in arms or legs:     Sudden onset of difficulty speaking or slurred speech:    Temporary loss of vision in one eye:     Problems with dizziness:         Gastrointestinal    Blood in stool:     Vomited blood:         Genitourinary    Burning when urinating:     Blood in urine:        Psychiatric    Major depression:  Hematologic    Bleeding problems:    Problems with blood clotting too easily:        Skin    Rashes or ulcers:        Constitutional    Fever or chills:      PHYSICAL EXAM:   Vitals:   08/09/16 1410  BP: (!) 140/95  Pulse: 96  Resp: 20  Temp: 98.9 F (37.2 C)  TempSrc: Oral  SpO2: 97%  Weight: 213 lb 11.2 oz (96.9 kg)  Height: 5\' 9"  (1.753 m)    GENERAL: The patient is a well-nourished male, in no acute distress. The vital signs are documented above. CARDIAC: There is a regular rate and rhythm.  VASCULAR: Mild edema to the left leg PULMONARY: Non-labored respirations ABDOMEN: Soft and non-tender with normal pitched bowel sounds.  MUSCULOSKELETAL: There are no major deformities or cyanosis. NEUROLOGIC: No focal weakness or paresthesias are detected. SKIN: There are no ulcers or rashes noted. PSYCHIATRIC: The patient has a normal affect.  STUDIES:   I have reviewed his ultrasound study.  There is no evidence of acute thrombus within the IVC or iliac veins.  The stent is widely patent.  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  He will remain on anticoagulation for 6 months.  At that time he can discontinue it.  I will see him back in 6 months for repeat ultrasound.    Annamarie Major, MD [facility] [phone] Pager [phone]

## 2017-02-07 ENCOUNTER — Encounter: Payer: Self-pay | Admitting: Surgery

## 2017-02-11 ENCOUNTER — Other Ambulatory Visit: Payer: Self-pay

## 2017-02-11 DIAGNOSIS — I871 Compression of vein: Secondary | ICD-10-CM

## 2017-02-21 ENCOUNTER — Ambulatory Visit (INDEPENDENT_AMBULATORY_CARE_PROVIDER_SITE_OTHER): Payer: Self-pay | Admitting: Surgery

## 2017-02-21 ENCOUNTER — Ambulatory Visit: Payer: Self-pay | Admitting: Surgery

## 2017-02-21 ENCOUNTER — Ambulatory Visit (HOSPITAL_COMMUNITY)
Admission: RE | Admit: 2017-02-21 | Discharge: 2017-02-21 | Disposition: A | Discharge status: Home / Self Care | Payer: Self-pay | Source: Ambulatory Visit | Attending: Surgery | Admitting: Surgery

## 2017-02-21 ENCOUNTER — Encounter (HOSPITAL_COMMUNITY): Payer: Self-pay

## 2017-02-21 ENCOUNTER — Ambulatory Visit (INDEPENDENT_AMBULATORY_CARE_PROVIDER_SITE_OTHER)
Admission: RE | Admit: 2017-02-21 | Discharge: 2017-02-21 | Disposition: A | Discharge status: Home / Self Care | Payer: Self-pay | Source: Ambulatory Visit | Attending: Surgery | Admitting: Surgery

## 2017-02-21 ENCOUNTER — Encounter: Payer: Self-pay | Admitting: Surgery

## 2017-02-21 VITALS — BP 203/123 | HR 92 | Temp 99.1°F | Resp 20 | Ht 69.0 in | Wt 214.0 lb

## 2017-02-21 DIAGNOSIS — I871 Compression of vein: Secondary | ICD-10-CM

## 2017-02-21 DIAGNOSIS — Z86718 Personal history of other venous thrombosis and embolism: Secondary | ICD-10-CM | POA: Insufficient documentation

## 2017-02-21 NOTE — Progress Notes (Signed)
Vascular and Vein Specialist of [place]  Patient name: Mitchell Cooper MRN: [id number] DOB: [birth year] Sex: male   REASON FOR VISIT:    Follow up  HISOTRY OF PRESENT ILLNESS:    Mitchell Cooper is a 56 y.o. male who returns today for follow up.  He initially presented in November 2017 with a pulmonary embolism as well as a left iliofemoral DVT.  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  He stopped taking his anticoagulation after 3 months because he could no longer afford it.  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  He states today that he is doing well.  He still gets swelling in his left leg.  He does not wear compression stockings.   PAST MEDICAL HISTORY:   Past Medical History:  Diagnosis Date  . DVT (deep venous thrombosis) ([place])   . Hypertension 1997   has never taken medications  . Tobacco dependence      FAMILY HISTORY:   Family History  Problem Relation Age of Onset  . CAD Mother 34    SOCIAL HISTORY:   Social History  Substance Use Topics  . Smoking status: Current Some Day Smoker    Packs/day: 0.25    Types: Cigarettes  . Smokeless tobacco: Never Used     Comment: 1 pack lasts 2 to 3 weeks  . Alcohol use 3.6 oz/week    6 Cans of beer per week     ALLERGIES:   No Known Allergies   CURRENT MEDICATIONS:   Current Outpatient Prescriptions  Medication Sig Dispense Refill  . amLODipine (NORVASC) 10 MG tablet Take 1 tablet (10 mg total) by mouth daily. 30 tablet 0  . aspirin EC 325 MG tablet Take 1 tablet (325 mg total) by mouth daily. 30 tablet 0  . apixaban (ELIQUIS) 5 MG TABS tablet 10 mg BID x 14 doses then 5 mg BID (Patient not taking: Reported on 02/21/2017) 116 tablet 0   No current facility-administered medications for this visit.      REVIEW OF SYSTEMS:   [X]  denotes positive finding, [ ]  denotes negative finding Cardiac  Comments:  Chest pain or chest pressure:    Shortness of breath upon exertion:    Short of breath when lying flat:    Irregular heart rhythm:        Vascular    Pain in calf, thigh, or hip brought on by ambulation:    Pain in feet at night that wakes you up from your sleep:     Blood clot in your veins:    Leg swelling:  x       Pulmonary    Oxygen at home:    Productive cough:     Wheezing:         Neurologic    Sudden weakness in arms or legs:     Sudden numbness in arms or legs:     Sudden onset of difficulty speaking or slurred speech:    Temporary loss of vision in one eye:     Problems with dizziness:         Gastrointestinal    Blood in stool:     Vomited blood:         Genitourinary    Burning when urinating:     Blood in urine:  Psychiatric    Major depression:         Hematologic    Bleeding problems:    Problems with blood clotting too easily:        Skin    Rashes or ulcers:        Constitutional    Fever or chills:      PHYSICAL EXAM:   There were no vitals filed for this visit.  GENERAL: The patient is a well-nourished male, in no acute distress. The vital signs are documented above. CARDIAC: There is a regular rate and rhythm.  VASCULAR: Left greater than right leg swelling PULMONARY: Non-labored respirations MUSCULOSKELETAL: There are no major deformities or cyanosis. NEUROLOGIC: No focal weakness or paresthesias are detected. SKIN: There are no ulcers or rashes noted. PSYCHIATRIC: The patient has a normal affect.  STUDIES:   I have ordered and reviewed his venous reflux study.  There appears to be thickening within his iliac vein stent.  There appears to be recanalization of the femoral popliteal veins  MEDICAL ISSUES:   May Turner syndrome: The patient continues to have swelling in his left leg likely from residual chronic DVT.  I  have encouraged him to wear compression stockings.  He is to come back in 6 months for ultrasound evaluation again of his iliac stent to make sure that it is not narrowing down.    Annamarie Major, MD [facility] of [facility] [phone] Pager [phone]

## 2017-02-23 NOTE — Addendum Note (Signed)
Addended by: Lianne Cure A on: 02/23/2017 02:18 PM   Modules accepted: Orders

## 2017-08-29 ENCOUNTER — Encounter (HOSPITAL_COMMUNITY): Payer: Self-pay

## 2017-08-29 ENCOUNTER — Ambulatory Visit: Payer: Self-pay | Admitting: Surgery

## 2018-12-10 ENCOUNTER — Encounter (HOSPITAL_COMMUNITY): Payer: Self-pay | Admitting: Emergency Medicine

## 2018-12-10 ENCOUNTER — Emergency Department (HOSPITAL_COMMUNITY)
Admission: EM | Admit: 2018-12-10 | Discharge: 2018-12-10 | Disposition: A | Discharge status: Home / Self Care | Payer: Self-pay | Attending: Emergency Medicine | Admitting: Emergency Medicine

## 2018-12-10 ENCOUNTER — Other Ambulatory Visit: Payer: Self-pay

## 2018-12-10 ENCOUNTER — Emergency Department (HOSPITAL_COMMUNITY): Payer: Self-pay

## 2018-12-10 DIAGNOSIS — Y939 Activity, unspecified: Secondary | ICD-10-CM | POA: Insufficient documentation

## 2018-12-10 DIAGNOSIS — W231XXA Caught, crushed, jammed, or pinched between stationary objects, initial encounter: Secondary | ICD-10-CM | POA: Insufficient documentation

## 2018-12-10 DIAGNOSIS — Y92481 Parking lot as the place of occurrence of the external cause: Secondary | ICD-10-CM | POA: Insufficient documentation

## 2018-12-10 DIAGNOSIS — R030 Elevated blood-pressure reading, without diagnosis of hypertension: Secondary | ICD-10-CM

## 2018-12-10 DIAGNOSIS — Z79899 Other long term (current) drug therapy: Secondary | ICD-10-CM | POA: Insufficient documentation

## 2018-12-10 DIAGNOSIS — S8991XA Unspecified injury of right lower leg, initial encounter: Secondary | ICD-10-CM | POA: Insufficient documentation

## 2018-12-10 DIAGNOSIS — Z7982 Long term (current) use of aspirin: Secondary | ICD-10-CM | POA: Insufficient documentation

## 2018-12-10 DIAGNOSIS — Y999 Unspecified external cause status: Secondary | ICD-10-CM | POA: Insufficient documentation

## 2018-12-10 DIAGNOSIS — Z87891 Personal history of nicotine dependence: Secondary | ICD-10-CM | POA: Insufficient documentation

## 2018-12-10 DIAGNOSIS — I10 Essential (primary) hypertension: Secondary | ICD-10-CM | POA: Insufficient documentation

## 2018-12-10 MED ORDER — AMLODIPINE BESYLATE 5 MG PO TABS
10.0000 mg | ORAL_TABLET | Freq: Once | ORAL | Status: AC
  Administered 2018-12-10: 10 mg via ORAL
  Filled 2018-12-10: qty 2

## 2018-12-10 MED ORDER — AMLODIPINE BESYLATE 10 MG PO TABS: 10.0000 mg | ORAL_TABLET | Freq: Every day | ORAL | 0 refills | Status: DC

## 2018-12-10 NOTE — Discharge Instructions (Addendum)
It was my pleasure taking care of you today!   Please see the information and instructions below regarding your visit.  Your diagnoses today include:  1. Injury of right knee, initial encounter     Tests performed today include: See side panel of your discharge paperwork for testing performed today. Vital signs are listed at the bottom of these instructions.   Medications prescribed:    Take any prescribed medications only as prescribed, and any over the counter medications only as directed on the packaging.  Tylenol as needed for pain. You may take 650 mg every 6 hours as needed for pain. Do not exceed 4000 mg in one day. Use crutches as needed for comfort. Ice and elevate knee throughout the day.  Home care instructions:  Please follow any educational materials contained in this packet.   Follow-up instructions: Call the orthopedist listed today or tomorrow to schedule follow up appointment for recheck of ongoing knee pain in the setting of injury.    Return instructions:  Please return to the Emergency Department if you experience worsening symptoms.  Please return for any increasing swelling, increasing pain, loss of color to the lower leg, or increasing redness. Please return if you have any other emergent concerns.  Additional Information:   Your vital signs today were: BP (!) 177/128    Pulse (!) 103    Temp 98.6 F (37 C) (Oral)    Resp 19    Ht 5\' 9"  (1.753 m)    Wt 99.8 kg    SpO2 98%    BMI 32.49 kg/m  If your blood pressure (BP) was elevated on multiple readings during this visit above 130 for the top number or above 80 for the bottom number, please have this repeated by your primary care provider within one month. --------------

## 2018-12-10 NOTE — ED Notes (Addendum)
Patient has own crutches that he came to ED. Patient opted to leave with ED crutches.

## 2018-12-10 NOTE — ED Triage Notes (Signed)
Patient presents with right knee pain and swelling post injury after stepping off curb. Denies numbing of tingling down leg. History of blood clots.. Able to move extremities.

## 2018-12-10 NOTE — ED Provider Notes (Addendum)
[facility] EMERGENCY DEPARTMENT Provider Note   CSN: [id number] Arrival date & time: 12/10/18  1514    History   Chief Complaint Chief Complaint  Patient presents with  . Knee Pain    HPI Mitchell Cooper is a 58 y.o. male.     HPI  Patient is a 39 male with a past medical history of DVT and PE, hypertension, CKD presenting for right knee pain 1 week after injury.  Patient reports that he got his foot caught in between the curb and a parking barrier and his foot twisted underneath his knee.  He has had continued pain around the right lateral knee as well as swelling of this region.  He denies any weakness or numbness distal to the injury.  He denies any popliteal or calf pain.  He reports that when his leg is in a dependent position he is able to fully flex and extend the knee without difficulty.  He is no longer on blood thinners.  Past Medical History:  Diagnosis Date  . DVT (deep venous thrombosis) ([place])   . Hypertension 1997   has never taken medications  . Tobacco dependence     Patient Active Problem List   Diagnosis Date Noted  . 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 Pulmonary embolism ([place]) 06/14/2016  . Uncontrolled hypertension 06/14/2016  . CKD (chronic kidney disease) stage 3, GFR 30-59 ml/min (HCC) 06/14/2016  . Hyperglycemia 06/14/2016  . Tobacco dependence 06/14/2016    Past Surgical History:  Procedure Laterality Date  . KNEE SURGERY  approx. 1984   reconstructive surgery of left knee; arthroscopic procedure about 1997  . PERIPHERAL VASCULAR CATHETERIZATION Left 06/16/2016   Procedure: Lower Extremity Venography- Venus Thrombolysis;  Surgeon: Serafina Mitchell, MD;  Location: [facility];  Service: Cardiovascular;  Laterality: Left;        Home Medications    Prior to Admission medications   Medication Sig Start Date End Date Taking? Authorizing Provider  amLODipine (NORVASC) 10 MG tablet Take 1 tablet (10 mg total) by  mouth daily. 06/18/16   Geradine Girt, DO  apixaban (ELIQUIS) 5 MG TABS tablet 10 mg BID x 14 doses then 5 mg BID Patient not taking: Reported on 02/21/2017 06/17/16   Geradine Girt, DO  aspirin EC 325 MG tablet Take 1 tablet (325 mg total) by mouth daily. 06/17/16   Geradine Girt, DO    Family History Family History  Problem Relation Age of Onset  . CAD Mother 61    Social History Social History   Tobacco Use  . Smoking status: Former Smoker    Types: Cigarettes    Last attempt to quit: 11/21/2016    Years since quitting: 2.0  . Smokeless tobacco: Never Used  Substance Use Topics  . Alcohol use: Yes    Alcohol/week: 6.0 standard drinks    Types: 6 Cans of beer per week  . Drug use: No     Allergies   Patient has no known allergies.   Review of Systems Review of Systems  Constitutional: Negative for chills and fever.  Cardiovascular: Negative for leg swelling.  Musculoskeletal: Positive for arthralgias, joint swelling and myalgias.  Skin: Negative for color change.  Neurological: Negative for weakness and numbness.     Physical Exam Updated Vital Signs BP (!) 193/121 (BP Location: Right Arm)   Pulse (!) 103   Temp 98.6 F (37 C) (Oral)   Resp 19   Ht 5'  9" (1.753 m)   Wt 99.8 kg   SpO2 98%   BMI 32.49 kg/m   Physical Exam Vitals signs and nursing note reviewed.  Constitutional:      General: He is not in acute distress.    Appearance: He is well-developed. He is not diaphoretic.     Comments: Sitting comfortably in bed.  HENT:     Head: Normocephalic and atraumatic.  Eyes:     General:        Right eye: No discharge.        Left eye: No discharge.     Conjunctiva/sclera: Conjunctivae normal.     Comments: EOMs normal to gross examination.  Neck:     Musculoskeletal: Normal range of motion.  Cardiovascular:     Rate and Rhythm: Normal rate and regular rhythm.     Comments: Intact, 2+ right DP pulse. No calf TTP.  Abdominal:     General:  There is no distension.  Musculoskeletal:        General: Swelling and tenderness present.     Comments: Right knee with tenderness to palpation of lateral knee. Decreased ROM 2/2 pain particularly with extension against resistance. No joint line tenderness. Superolateral joint effusion appreciated. No abnormal alignment or patellar mobility. No bruising, erythema or warmth overlaying the joint. No varus/valgus laxity. Negative drawer's, Lachman's and McMurray's.  No crepitus.  2+ DP pulses bilaterally. All compartments are soft. Sensation intact distal to injury.   Skin:    General: Skin is warm and dry.  Neurological:     Mental Status: He is alert.     Comments: Cranial nerves intact to gross observation. Patient moves extremities without difficulty.  Psychiatric:        Behavior: Behavior normal.        Thought Content: Thought content normal.        Judgment: Judgment normal.      ED Treatments / Results  Labs (all labs ordered are listed, but only abnormal results are displayed) Labs Reviewed - No data to display  EKG None  Radiology Dg Knee Complete 4 Views Right  Result Date: 12/10/2018 CLINICAL DATA:  Hyperextension injury of the knee 1.5 weeks ago. Pain. EXAM: RIGHT KNEE - COMPLETE 4+ VIEW COMPARISON:  None. FINDINGS: No acute fracture or dislocation. 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 Large right knee joint effusion. No aggressive osseous lesion. IMPRESSION: 1.  No acute osseous injury of the right knee. 2. Large right knee joint effusion. 3. 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 Electronically Signed   By: Kathreen Devoid   On: 12/10/2018 16:46    Procedures Procedures (including critical care time)  Medications Ordered in ED Medications - No data to display   Initial Impression / Assessment and Plan / ED Course  I have reviewed the triage vital signs and the nursing notes.  Pertinent labs & imaging results that were  available during my care of the patient were reviewed by me and considered in my medical decision making (see chart for details).        This is a well-appearing 58 year old male with past medical history of VTE and hypertension presenting for continued right knee pain after injury 1 week ago.  Injury and exam not consistent with tibial plateau fracture.  There was no axial loading injury. Joint does not appear septic and is mobile.  Lower suspicion for hemarthrosis given lack of anticoagulation currently.  He is able to bear some weight.  More suspicious for lateral collateral ligament injury.  Will place patient knee immobilizer, crutches, and follow-up with orthopedics.  He is given return precautions for any increasing pain, swelling, inability to bear weight, or sensory loss.  Patient is in understanding and agrees with plan of care.  Incidentally, patient had elevated blood pressure today.  He does not have any headaches, vision changes, chest pain, shortness of breath, signs of hypertensive urgency or emergency.  He reports he is nearly out of his antihypertensive amlodipine and did not take it today.  Case management consulted as patient is currently without insurance and does not have a PCP. Amlodipine prescribed.   Final Clinical Impressions(s) / ED Diagnoses   Final diagnoses:  Injury of right knee, initial encounter  Elevated blood pressure reading    ED Discharge Orders         Ordered    amLODipine (NORVASC) 10 MG tablet  Daily     12/10/18 1810           Tamala Julian 12/10/18 1813    Albesa Seen, PA-C 12/10/18 1817    Maudie Flakes, MD 12/11/18 1330

## 2018-12-10 NOTE — Progress Notes (Signed)
Orthopedic Tech Progress Note Patient Details:  Mitchell Cooper [birth year] [id number]  Ortho Devices Type of Ortho Device: Crutches, Knee Immobilizer Ortho Device/Splint Location: LRE Ortho Device/Splint Interventions: Adjustment, Application, Ordered   Post Interventions Patient Tolerated: Well Instructions Provided: Poper ambulation with device, Care of device, Adjustment of device   [name] 12/10/2018, 6:17 PM

## 2019-06-05 ENCOUNTER — Other Ambulatory Visit: Payer: Self-pay

## 2019-06-05 DIAGNOSIS — Z20822 Contact with and (suspected) exposure to covid-19: Secondary | ICD-10-CM

## 2019-06-07 LAB — NOVEL CORONAVIRUS, NAA: SARS-CoV-2, NAA: DETECTED — AB

## 2019-06-11 ENCOUNTER — Telehealth: Payer: Self-pay

## 2019-06-11 NOTE — Telephone Encounter (Signed)
Pt given Covid-19 positive results. Discussed mild, moderate and severe symptoms. Advised pt to call 911 for any respiratory issues and/dehydration. Discussed non test criteria for ending self isolation. Pt advised of way to manage symptoms at home and review isolation precautions especially the importance of washing hands frequently and wearing a mask when around others. Pt verbalized understanding. Will report to HD.

## 2019-06-12 ENCOUNTER — Encounter: Payer: Self-pay | Admitting: *Deleted

## 2019-11-03 ENCOUNTER — Inpatient Hospital Stay (HOSPITAL_COMMUNITY)
Admission: EM | Admit: 2019-11-03 | Discharge: 2019-11-06 | DRG: 175 | Disposition: A | Discharge status: Home / Self Care | Payer: Medicaid Other | Attending: Pulmonary Disease | Admitting: Pulmonary Disease

## 2019-11-03 ENCOUNTER — Emergency Department (HOSPITAL_COMMUNITY): Payer: Medicaid Other

## 2019-11-03 ENCOUNTER — Encounter (HOSPITAL_COMMUNITY): Payer: Self-pay

## 2019-11-03 ENCOUNTER — Other Ambulatory Visit: Payer: Self-pay

## 2019-11-03 DIAGNOSIS — I2699 Other pulmonary embolism without acute cor pulmonale: Secondary | ICD-10-CM | POA: Diagnosis present

## 2019-11-03 DIAGNOSIS — Z86711 Personal history of pulmonary embolism: Secondary | ICD-10-CM

## 2019-11-03 DIAGNOSIS — J9601 Acute respiratory failure with hypoxia: Secondary | ICD-10-CM | POA: Diagnosis not present

## 2019-11-03 DIAGNOSIS — Z7901 Long term (current) use of anticoagulants: Secondary | ICD-10-CM | POA: Diagnosis not present

## 2019-11-03 DIAGNOSIS — I2609 Other pulmonary embolism with acute cor pulmonale: Secondary | ICD-10-CM | POA: Diagnosis not present

## 2019-11-03 DIAGNOSIS — E669 Obesity, unspecified: Secondary | ICD-10-CM | POA: Diagnosis present

## 2019-11-03 DIAGNOSIS — N183 Chronic kidney disease, stage 3 unspecified: Secondary | ICD-10-CM | POA: Diagnosis present

## 2019-11-03 DIAGNOSIS — I82512 Chronic embolism and thrombosis of left femoral vein: Secondary | ICD-10-CM | POA: Diagnosis not present

## 2019-11-03 DIAGNOSIS — I469 Cardiac arrest, cause unspecified: Secondary | ICD-10-CM | POA: Diagnosis not present

## 2019-11-03 DIAGNOSIS — Z7982 Long term (current) use of aspirin: Secondary | ICD-10-CM | POA: Diagnosis not present

## 2019-11-03 DIAGNOSIS — I82432 Acute embolism and thrombosis of left popliteal vein: Secondary | ICD-10-CM | POA: Diagnosis present

## 2019-11-03 DIAGNOSIS — Z79899 Other long term (current) drug therapy: Secondary | ICD-10-CM

## 2019-11-03 DIAGNOSIS — Z20822 Contact with and (suspected) exposure to covid-19: Secondary | ICD-10-CM | POA: Diagnosis present

## 2019-11-03 DIAGNOSIS — R0600 Dyspnea, unspecified: Secondary | ICD-10-CM

## 2019-11-03 DIAGNOSIS — N1831 Chronic kidney disease, stage 3a: Secondary | ICD-10-CM | POA: Diagnosis not present

## 2019-11-03 DIAGNOSIS — Z8249 Family history of ischemic heart disease and other diseases of the circulatory system: Secondary | ICD-10-CM | POA: Diagnosis not present

## 2019-11-03 DIAGNOSIS — D696 Thrombocytopenia, unspecified: Secondary | ICD-10-CM | POA: Diagnosis present

## 2019-11-03 DIAGNOSIS — I129 Hypertensive chronic kidney disease with stage 1 through stage 4 chronic kidney disease, or unspecified chronic kidney disease: Secondary | ICD-10-CM | POA: Diagnosis present

## 2019-11-03 DIAGNOSIS — Z6830 Body mass index (BMI) 30.0-30.9, adult: Secondary | ICD-10-CM | POA: Diagnosis not present

## 2019-11-03 DIAGNOSIS — I10 Essential (primary) hypertension: Secondary | ICD-10-CM | POA: Diagnosis present

## 2019-11-03 LAB — BASIC METABOLIC PANEL
Anion gap: 11 (ref 5–15)
BUN: 20 mg/dL (ref 6–20)
CO2: 21 mmol/L — ABNORMAL LOW (ref 22–32)
Calcium: 8.6 mg/dL — ABNORMAL LOW (ref 8.9–10.3)
Chloride: 109 mmol/L (ref 98–111)
Creatinine, Ser: 1.85 mg/dL — ABNORMAL HIGH (ref 0.61–1.24)
GFR calc Af Amer: 46 mL/min — ABNORMAL LOW (ref >60)
GFR calc non Af Amer: 40 mL/min — ABNORMAL LOW (ref >60)
Glucose, Bld: 121 mg/dL — ABNORMAL HIGH (ref 70–99)
Potassium: 4.1 mmol/L (ref 3.5–5.1)
Sodium: 141 mmol/L (ref 135–145)

## 2019-11-03 LAB — CBC
HCT: 43.6 % (ref 39.0–52.0)
Hemoglobin: 14.4 g/dL (ref 13.0–17.0)
MCH: 26.9 pg (ref 26.0–34.0)
MCHC: 33 g/dL (ref 30.0–36.0)
MCV: 81.3 fL (ref 80.0–100.0)
Platelets: 143 10*3/uL — ABNORMAL LOW (ref 150–400)
RBC: 5.36 MIL/uL (ref 4.22–5.81)
RDW: 14.6 % (ref 11.5–15.5)
WBC: 11.5 10*3/uL — ABNORMAL HIGH (ref 4.0–10.5)
nRBC: 0 % (ref 0.0–0.2)

## 2019-11-03 LAB — RESPIRATORY PANEL BY RT PCR (FLU A&B, COVID)
Influenza A by PCR: NEGATIVE
Influenza B by PCR: NEGATIVE
SARS Coronavirus 2 by RT PCR: NEGATIVE

## 2019-11-03 LAB — TROPONIN I (HIGH SENSITIVITY): Troponin I (High Sensitivity): 91 ng/L — ABNORMAL HIGH (ref <18)

## 2019-11-03 LAB — BRAIN NATRIURETIC PEPTIDE: B Natriuretic Peptide: 637 pg/mL — ABNORMAL HIGH (ref 0.0–100.0)

## 2019-11-03 LAB — POC SARS CORONAVIRUS 2 AG -  ED: SARS Coronavirus 2 Ag: NEGATIVE

## 2019-11-03 MED ORDER — HEPARIN (PORCINE) 25000 UT/250ML-% IV SOLN
1600.0000 [IU]/h | INTRAVENOUS | Status: DC
  Administered 2019-11-03: 1550 [IU]/h via INTRAVENOUS
  Filled 2019-11-03: qty 250

## 2019-11-03 MED ORDER — HEPARIN BOLUS VIA INFUSION
3000.0000 [IU] | Freq: Once | INTRAVENOUS | Status: AC
  Administered 2019-11-03: 3000 [IU] via INTRAVENOUS
  Filled 2019-11-03: qty 3000

## 2019-11-03 MED ORDER — ACETAMINOPHEN 650 MG RE SUPP: 650.0000 mg | Freq: Four times a day (QID) | RECTAL | Status: DC | PRN

## 2019-11-03 MED ORDER — ACETAMINOPHEN 325 MG PO TABS
650.0000 mg | ORAL_TABLET | Freq: Four times a day (QID) | ORAL | Status: DC | PRN
  Administered 2019-11-04 – 2019-11-05 (×2): 650 mg via ORAL
  Filled 2019-11-03 (×2): qty 2

## 2019-11-03 MED ORDER — SODIUM CHLORIDE 0.9 % IV SOLN: INTRAVENOUS | Status: DC

## 2019-11-03 MED ORDER — LABETALOL HCL 5 MG/ML IV SOLN
5.0000 mg | INTRAVENOUS | Status: DC | PRN
  Administered 2019-11-04: 5 mg via INTRAVENOUS
  Filled 2019-11-03: qty 4

## 2019-11-03 MED ORDER — ONDANSETRON HCL 4 MG/2ML IJ SOLN: 4.0000 mg | Freq: Four times a day (QID) | INTRAMUSCULAR | Status: DC | PRN

## 2019-11-03 MED ORDER — IOHEXOL 350 MG/ML SOLN
100.0000 mL | Freq: Once | INTRAVENOUS | Status: AC | PRN
  Administered 2019-11-03: 100 mL via INTRAVENOUS

## 2019-11-03 MED ORDER — ONDANSETRON HCL 4 MG PO TABS: 4.0000 mg | ORAL_TABLET | Freq: Four times a day (QID) | ORAL | Status: DC | PRN

## 2019-11-03 NOTE — Consult Note (Signed)
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  History of present illness   Mr. 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  Last PE was approximately 3 years ago in 2017 and patient states that he stopped taking Eliquis approximately 9 months ago because he could not afford it.  Received his second Covid-19 vaccination several days before noting progressive shortness of breath.  No fevers, chills, coughing, URI symptoms, chest pain or leg pain and swelling. SOB bnoted on Tuesday. Progressed over the past few days with worsening DOE. No cough. No hemoptysis.    In the emergency department, patient was tachycardic in the 130s, tachypneic and oxygen saturations as low as 91% so he was placed on 3 L nasal cannula.  He has not been hypotensive.  BNP 637, high-sensitivity troponin 91, creatinine 1.8.  CTA was obtained showing multiple large central and peripheral PEs with filling defects bilaterally and concernforrightheartstrain therefore PCCM consulted.  Past Medical History   has a past medical history of DVT (deep venous thrombosis) ([place]), Hypertension (1997), and Tobacco dependence.  Significant Hospital Events   4/17 admit for pulmonary embolism  Consults:  PCCM  Procedures:    Significant Diagnostic Tests:  4/17 CTA chest>>Multiple large central and peripheral pulmonary artery filling defects bilaterally  Micro Data:  4/17  Covid-19>> negative  Antimicrobials:    Interim history/subjective:  Patient remained hemodynamically stable in the emergency department  Objective   Blood pressure (!) 150/119, pulse (!) 109, temperature 97.9 F (36.6 C), temperature source Oral, resp. rate (!) 28, height 5\' 9"  (1.753 m), weight 97.5 kg, SpO2 99 %.       No intake or output data in the 24 hours ending 11/03/19 2113 Filed Weights   11/03/19 1708  Weight: 97.5 kg   General:   HEENT: MM pink/moist Neuro: No focal  CV: s1s2 , no m/r/g PULM:  No evidence of increased WOB at rest.  GI: soft, bsx4 active  Extremities: warm/dry, No pitting edema  Skin: no rashes or lesions   Resolved Hospital Problem list   NA  Assessment & Plan:   Multiple acute bilateral pulmonary embolisms with concern for right heart strain-Patient with submassive PE. BP stable and patient hypertensive at this time -Troponin 91, BNP 637 and requiring 3 L nasal cannula oxygen with EKG notable for inverted P and T wave in V1 concerning for strain in addition to S1Q3T3 -heparin gtt initiated -was on Eliquis for prior PE which was discontinued several months ago, will likely need lifelong anticoagulation P: -Continue Heparin GTT -Attain echo in the AM -Have spoken with IR. Given that patient is stable they will see in the morning after echo and see if he would be a candidate for EKOS    Hypertension -No home medications secondary to lack of insurance -Would probably benefit from PRN anti hypertensive. Would not schedule anything at this time    Stage III CKD -Creatinine 1.8, slightly above  historical baseline of 1.1-1.4 in 2017 -Avoid nephrotoxins   Best practice:  Diet: As tolerated Pain/Anxiety/Delirium protocol (if indicated): N/A VAP protocol (if indicated): N/A DVT prophylaxis: Heparin GI prophylaxis: N/A Glucose control: SSI Mobility: Bedrest Code Status: Full code Family Communication: Patient can discuss with them   Disposition:   Labs   CBC: Recent Labs  Lab 11/03/19 1722  WBC 11.5*  HGB 14.4  HCT 43.6  MCV 81.3  PLT 143*    Basic Metabolic Panel: Recent Labs  Lab 11/03/19 1722  NA 141  K 4.1  CL 109  CO2 21*  GLUCOSE 121*  BUN 20  CREATININE 1.85*  CALCIUM 8.6*   GFR: Estimated Creatinine Clearance: 50.7 mL/min (A) (by C-G formula based on SCr of 1.85 mg/dL (H)). Recent Labs  Lab 11/03/19 1722  WBC 11.5*    Liver Function Tests: No results for input(s): AST, ALT, ALKPHOS, BILITOT, PROT, ALBUMIN in the last 168 hours. No results for input(s): LIPASE, AMYLASE in the last 168 hours. No results for input(s): AMMONIA in the last 168 hours.  ABG No results found for: PHART, PCO2ART, PO2ART, HCO3, TCO2, ACIDBASEDEF, O2SAT   Coagulation Profile: No results for input(s): INR, PROTIME in the last 168 hours.  Cardiac Enzymes: No results for input(s): CKTOTAL, CKMB, CKMBINDEX, TROPONINI in the last 168 hours.  HbA1C: Hgb A1c MFr Bld  Date/Time Value Ref Range Status  06/14/2016 04:42 PM 5.5 4.8 - 5.6 % Final    Comment:    (NOTE)         Pre-diabetes: 5.7 - 6.4         Diabetes: >6.4         Glycemic control for adults with diabetes: <7.0     CBG: No results for input(s): GLUCAP in the last 168 hours.  Review of Systems:   14 point ROS negative unless otherwise stated in HPI.   Past Medical History  He,  has a past medical history of DVT (deep venous thrombosis) ([place]), Hypertension (1997), and Tobacco dependence.   Surgical History    Past Surgical History:  Procedure Laterality Date  . KNEE SURGERY  approx. 1984   reconstructive surgery of left knee; arthroscopic procedure about 1997  . PERIPHERAL VASCULAR CATHETERIZATION Left 06/16/2016   Procedure: Lower Extremity Venography- Venus Thrombolysis;  Surgeon: Serafina Mitchell, MD;  Location: [facility];  Service: Cardiovascular;  Laterality: Left;     Social History   reports that he quit smoking  about 2 years ago. His smoking use included cigarettes. He has never used smokeless tobacco. He reports current alcohol use of about 6.0 standard drinks of alcohol per week. He reports that he does not use drugs.   Family History   His family history includes CAD (age of onset: 9) in his mother.   Allergies No Known Allergies   Home Medications  Prior to Admission medications   Medication Sig Start Date End Date Taking? Authorizing Provider  amLODipine (NORVASC) 10 MG tablet Take 1 tablet (10 mg total) by mouth daily. 12/10/18   Langston Masker B, PA-C  apixaban (ELIQUIS) 5 MG TABS tablet 10 mg BID x 14 doses then 5 mg BID Patient not taking: Reported on 02/21/2017 06/17/16   Geradine Girt, DO  aspirin EC 325 MG tablet Take 1 tablet (325 mg total) by mouth daily. 06/17/16   Geradine Girt, DO     Critical care time: 60 minutes

## 2019-11-03 NOTE — Progress Notes (Signed)
ANTICOAGULATION CONSULT NOTE - Initial Consult  Pharmacy Consult for heparin Indication: pulmonary embolus  No Known Allergies  Patient Measurements: Height: 5\' 9"  (175.3 cm) Weight: 97.5 kg (215 lb) IBW/kg (Calculated) : 70.7 Heparin Dosing Weight: 91 kg  Vital Signs: Temp: 97.9 F (36.6 C) (04/17 1659) Temp Source: Oral (04/17 1659) BP: 150/119 (04/17 2106) Pulse Rate: 109 (04/17 2106)  Labs: Recent Labs    11/03/19 1722  HGB 14.4  HCT 43.6  PLT 143*  CREATININE 1.85*  TROPONINIHS 91*    Estimated Creatinine Clearance: 50.7 mL/min (A) (by C-G formula based on SCr of 1.85 mg/dL (H)).   Medical History: Past Medical History:  Diagnosis Date  . DVT (deep venous thrombosis) ([place])   . Hypertension 1997   has never taken medications  . Tobacco dependence     Medications: No anticoagulants PTA  Assessment: 63 yoM presenting to ED with SOB. CTA revealed bilateral PE. Pt has PMH significant for PE and reported previously taking apixaban, but reported that he is no longer taking anticoagulants PTA.   Today, 11/03/19  CBC: Hgb WNL, Plt slightly low  SCr 1.85, CrCl ~50 mL/min  Goal of Therapy:  Heparin level 0.3-0.7 units/ml Monitor platelets by anticoagulation protocol: Yes   Plan:   Give 3000 unit heparin bolus x 1  Start heparin infusion at 1550 units/hr  Check anti-Xa level in 6 hours and daily while on heparin  Monitor for signs and symptoms of bleeding  Lenis Noon, PharmD 11/03/2019,9:11 PM

## 2019-11-03 NOTE — H&P (Signed)
History and Physical    JACARRI GESNER YPP:[id number] DOB: 1961/05/28 DOA: 11/03/2019  PCP: Patient, No Pcp Per  Patient coming from: Home  I have personally briefly reviewed patient's old medical records in [place]  Chief Complaint: SOB  HPI: Mitchell Cooper is a 59 y.o. male with medical history significant of DVT/PE back in 2017, on eliquis till ~9 months ago but then off because he couldn't afford.  HTN.  Pt presents to the ED with 4 day h/o progressive dyspnea, now severe.  Had [place] in Nov.  Got 2nd dose of Pfizer vaccine on Sat.  Sunday had myalgias.  Those resolved and he was well until about 4 days ago when he developed progressive SOB.  Feels like prior PE.   ED Course: platelets of 143.  CTA confirms large bilateral PEs with R heart strain.  EKG with tachycardia and findings suggestive of RHS  Trop 91, BNP 637.  Pt started on heparin gtt.  PCCM consulted and hospitalist asked to admit.   Review of Systems: As per HPI, otherwise all review of systems negative.  Past Medical History:  Diagnosis Date  . DVT (deep venous thrombosis) ([place])   . Hypertension 1997   has never taken medications  . Tobacco dependence     Past Surgical History:  Procedure Laterality Date  . KNEE SURGERY  approx. 1984   reconstructive surgery of left knee; arthroscopic procedure about 1997  . PERIPHERAL VASCULAR CATHETERIZATION Left 06/16/2016   Procedure: Lower Extremity Venography- Venus Thrombolysis;  Surgeon: Serafina Mitchell, MD;  Location: [facility];  Service: Cardiovascular;  Laterality: Left;     reports that he quit smoking about 2 years ago. His smoking use included cigarettes. He has never used smokeless tobacco. He reports current alcohol use of about 6.0 standard drinks of alcohol per week. He reports that he does not use drugs.  No Known Allergies  Family History  Problem Relation Age of Onset  . CAD Mother 59     Prior to Admission medications    Medication Sig Start Date End Date Taking? Authorizing Provider  amLODipine (NORVASC) 10 MG tablet Take 1 tablet (10 mg total) by mouth daily. 12/10/18   Langston Masker B, PA-C  apixaban (ELIQUIS) 5 MG TABS tablet 10 mg BID x 14 doses then 5 mg BID Patient not taking: Reported on 02/21/2017 06/17/16   Geradine Girt, DO  aspirin EC 325 MG tablet Take 1 tablet (325 mg total) by mouth daily. 06/17/16   Geradine Girt, DO    Physical Exam: Vitals:   11/03/19 2054 11/03/19 2106 11/03/19 2142 11/03/19 2219  BP:  (!) 150/119 (!) 162/137 (!) 146/101  Pulse: (!) 109 (!) 109 (!) 110 (!) 112  Resp: (!) 30 (!) 28 (!) 26 (!) 22  Temp:      TempSrc:      SpO2: 97% 99% 98% 97%  Weight:      Height:        Constitutional: NAD, calm, comfortable Eyes: PERRL, lids and conjunctivae normal ENMT: Mucous membranes are moist. Posterior pharynx clear of any exudate or lesions.Normal dentition.  Neck: normal, supple, no masses, no thyromegaly Respiratory: Tachypnic Cardiovascular: Tachycardic Abdomen: no tenderness, no masses palpated. No hepatosplenomegaly. Bowel sounds positive.  Musculoskeletal: no clubbing / cyanosis. No joint deformity upper and lower extremities. Good ROM, no contractures. Normal muscle tone.  Skin: no rashes, lesions, ulcers. No induration Neurologic: CN 2-12 grossly intact. Sensation intact, DTR  normal. Strength 5/5 in all 4.  Psychiatric: Normal judgment and insight. Alert and oriented x 3. Normal mood.    Labs on Admission: I have personally reviewed following labs and imaging studies  CBC: Recent Labs  Lab 11/03/19 1722  WBC 11.5*  HGB 14.4  HCT 43.6  MCV 81.3  PLT 765*   Basic Metabolic Panel: Recent Labs  Lab 11/03/19 1722  NA 141  K 4.1  CL 109  CO2 21*  GLUCOSE 121*  BUN 20  CREATININE 1.85*  CALCIUM 8.6*   GFR: Estimated Creatinine Clearance: 50.7 mL/min (A) (by C-G formula based on SCr of 1.85 mg/dL (H)). Liver Function Tests: No results for  input(s): AST, ALT, ALKPHOS, BILITOT, PROT, ALBUMIN in the last 168 hours. No results for input(s): LIPASE, AMYLASE in the last 168 hours. No results for input(s): AMMONIA in the last 168 hours. Coagulation Profile: No results for input(s): INR, PROTIME in the last 168 hours. Cardiac Enzymes: No results for input(s): CKTOTAL, CKMB, CKMBINDEX, TROPONINI in the last 168 hours. BNP (last 3 results) No results for input(s): PROBNP in the last 8760 hours. HbA1C: No results for input(s): HGBA1C in the last 72 hours. CBG: No results for input(s): GLUCAP in the last 168 hours. Lipid Profile: No results for input(s): CHOL, HDL, LDLCALC, TRIG, CHOLHDL, LDLDIRECT in the last 72 hours. Thyroid Function Tests: No results for input(s): TSH, T4TOTAL, FREET4, T3FREE, THYROIDAB in the last 72 hours. Anemia Panel: No results for input(s): VITAMINB12, FOLATE, FERRITIN, TIBC, IRON, RETICCTPCT in the last 72 hours. Urine analysis: No results found for: COLORURINE, APPEARANCEUR, LABSPEC, PHURINE, GLUCOSEU, HGBUR, BILIRUBINUR, KETONESUR, PROTEINUR, UROBILINOGEN, NITRITE, LEUKOCYTESUR  Radiological Exams on Admission: CT Angio Chest PE W and/or Wo Contrast  Result Date: 11/03/2019 CLINICAL DATA:  Progressive shortness of breath beginning 4 days after receiving his 2nd COVID vaccine. EXAM: CT ANGIOGRAPHY CHEST WITH CONTRAST TECHNIQUE: Multidetector CT imaging of the chest was performed using the standard protocol during bolus administration of intravenous contrast. Multiplanar CT image reconstructions and MIPs were obtained to evaluate the vascular anatomy. CONTRAST:  151mL OMNIPAQUE IOHEXOL 350 MG/ML SOLN COMPARISON:  Portable chest obtained earlier today. Chest CTA dated 06/14/2016. FINDINGS: Cardiovascular: Multiple large central and peripheral pulmonary artery filling defects bilaterally. These are significantly restricting blood flow bilaterally, especially on the right. The right ventricular to left  ventricular ratio is 1.36. The heart remains mildly enlarged. Mediastinum/Nodes: No enlarged mediastinal, hilar, or axillary lymph nodes. Thyroid gland, trachea, and esophagus demonstrate no significant findings. Lungs/Pleura: Minimal bibasilar linear atelectasis or scarring. No pleural fluid. Upper Abdomen: There is some reflux of contrast into the inferior vena cava and hepatic veins. Musculoskeletal: Minimal thoracic spine degenerative changes. Review of the MIP images confirms the above findings. IMPRESSION: 1. Multiple large central and peripheral pulmonary emboli bilaterally, significantly restricting blood flow bilaterally, especially on the right. 2. Elevated right ventricular to left ventricular ratio. In view of the multiple large bilateral pulmonary emboli with restriction of blood flow and reflux contrast into the inferior vena cava and hepatic veins, this is compatible with right heart strain associated with at least submassive PE. 3. Stable mild cardiomegaly. Critical Value/emergent results were called by telephone at the time of interpretation on 11/03/2019 at 8:29 pm to provider [facility] , who verbally acknowledged these results. Electronically Signed   By: Claudie Revering M.D.   On: 11/03/2019 20:38   DG Chest Port 1 View  Result Date: 11/03/2019 CLINICAL DATA:  59 year old male with shortness of  breath. EXAM: PORTABLE CHEST 1 VIEW COMPARISON:  Chest radiograph dated 06/14/2016 and CT dated 06/14/2016 FINDINGS: No focal consolidation, pleural effusion, pneumothorax. The cardiac silhouette is within normal limits. No acute osseous pathology. IMPRESSION: No active disease. Electronically Signed   By: Anner Crete M.D.   On: 11/03/2019 17:52    EKG: Independently reviewed.  Assessment/Plan Principal Problem:   Acute pulmonary embolism (HCC) Active Problems:   Uncontrolled hypertension   CKD (chronic kidney disease) stage 3, GFR 30-59 ml/min    1. Acute submassive PE with RHS - 1. 2d  echo 2. [place] BLE for DVT 3. Tele monitor 4. Transfer to [facility] for possible EKOS 5. NPO for the moment 1. IVF NS at 75 cc/hr 6. VITT is considered and discussed with Dr. Benay Spice, Dr. Oletta Darter, and Dr. Orpah Melter: 1. Dr. Benay Spice hadnt heard of VITT being associated with HIT until I mentioned it, indeed the article I quoted was published just yesterday. 1. Recommended sending HIT antibody out, which I have ordered 2. Dr. Oletta Darter recommended to use heparin gtt for now unless HIT pnl came back positive. 3. Dr. Orpah Melter also agreed with heparin gtt as the recommended medication at this time and noted that the VITT syndrome has not been associated with the Pfizer vaccine to this point, only the JJ and AZ (adenovirus vector vaccines). 4. Additionally patient doesn't really have significant thrombocytopenia at this time (patelets of 143, the slightly low value could just be consumptive in setting of large clots as Dr. Benay Spice notes). 7. Therefore will use heparin gtt for the moment 8. Repeat CBC/BMP in AM 2. CKD 3 - 1. Creat of 1.8 slightly up from previous 1.4 baseline 2. Repeat BMP in AM 3. HTN - 1. Not on any home meds 2. Use PRN labetalol if needed  DVT prophylaxis: Heparin gtt Code Status: Full Family Communication: No family in room Disposition Plan: Home after stabilized from submassive PE standpoint Consults called: PCCM, also curbsided Dr. Benay Spice, see discussion above Admission status: Admit to inpatient  Severity of Illness: The appropriate patient status for this patient is INPATIENT. Inpatient status is judged to be reasonable and necessary in order to provide the required intensity of service to ensure the patient's safety. The patient's presenting symptoms, physical exam findings, and initial radiographic and laboratory data in the context of their chronic comorbidities is felt to place them at high risk for further clinical deterioration. Furthermore, it is not anticipated that the  patient will be medically stable for discharge from the hospital within 2 midnights of admission. The following factors support the patient status of inpatient.   IP for submassive PE with RHS  * I certify that at the point of admission it is my clinical judgment that the patient will require inpatient hospital care spanning beyond 2 midnights from the point of admission due to high intensity of service, high risk for further deterioration and high frequency of surveillance required.*    Alinna Siple M. DO [facility]  How to contact the [facility] Attending or Consulting provider [place] or covering provider during after hours [place], for this patient?  1. Check the care team in [facility] and look for a) attending/consulting [facility] provider listed and b) the [facility] team listed 2. Log into [email or web address]  Amion Physician Scheduling and messaging for groups and whole hospitals  On call and physician scheduling software for group practices, residents, hospitalists and other medical providers for call, clinic, rotation and shift schedules. OnCall [employer] is a  hospital-wide system for scheduling doctors and paging doctors on call. EasyPlot is for scientific plotting and data analysis.  [email or web address]  and use [place]'s universal password to access. If you do not have the password, please contact the hospital operator.  3. Locate the [facility] provider you are looking for under [facility] and page to a number that you can be directly reached. 4. If you still have difficulty reaching the provider, please page the [facility] (Director on Call) for the Hospitalists listed on amion for assistance.  11/03/2019, 10:35 PM

## 2019-11-03 NOTE — ED Provider Notes (Signed)
[facility] DEPT Provider Note   CSN: [id number] Arrival date & time: 11/03/19  1650     History Chief Complaint  Patient presents with  . Shortness of Breath  . Hypertension    Mitchell Cooper is a 59 y.o. male.  HPI   Patient presents to the ED for evaluation of shortness of breath.  Patient states he had his second Covid vaccination on Saturday.  The next day he started having some myalgias.  Those symptoms resolved and was doing well until about 4 days ago when he started to have aggressive shortness of breath.  Patient states that symptoms have been getting worse.  He is having difficulty breathing.  He has a history of pulmonary embolism in the past and feels like this is similar.  Patient states his doctor took him off Eliquis about 9 months ago.  He is post to be on blood pressure medications and has not had those for several months because of insurance issues.  He denies any fevers or chills.  No leg swelling.  No chest pain.  Past Medical History:  Diagnosis Date  . DVT (deep venous thrombosis) ([place])   . Hypertension 1997   has never taken medications  . Tobacco dependence     Patient Active Problem List   Diagnosis Date Noted  . 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 Pulmonary embolism ([place]) 06/14/2016  . Uncontrolled hypertension 06/14/2016  . CKD (chronic kidney disease) stage 3, GFR 30-59 ml/min 06/14/2016  . Hyperglycemia 06/14/2016  . Tobacco dependence 06/14/2016    Past Surgical History:  Procedure Laterality Date  . KNEE SURGERY  approx. 1984   reconstructive surgery of left knee; arthroscopic procedure about 1997  . PERIPHERAL VASCULAR CATHETERIZATION Left 06/16/2016   Procedure: Lower Extremity Venography- Venus Thrombolysis;  Surgeon: Serafina Mitchell, MD;  Location: [facility];  Service: Cardiovascular;  Laterality: Left;       Family History  Problem Relation Age of Onset  . CAD Mother 63    Social  History   Tobacco Use  . Smoking status: Former Smoker    Types: Cigarettes    Quit date: 11/21/2016    Years since quitting: 2.9  . Smokeless tobacco: Never Used  Substance Use Topics  . Alcohol use: Yes    Alcohol/week: 6.0 standard drinks    Types: 6 Cans of beer per week  . Drug use: No    Home Medications Prior to Admission medications   Medication Sig Start Date End Date Taking? Authorizing Provider  amLODipine (NORVASC) 10 MG tablet Take 1 tablet (10 mg total) by mouth daily. 12/10/18   Langston Masker B, PA-C  apixaban (ELIQUIS) 5 MG TABS tablet 10 mg BID x 14 doses then 5 mg BID Patient not taking: Reported on 02/21/2017 06/17/16   Geradine Girt, DO  aspirin EC 325 MG tablet Take 1 tablet (325 mg total) by mouth daily. 06/17/16   Geradine Girt, DO    Allergies    Patient has no known allergies.  Review of Systems   Review of Systems  All other systems reviewed and are negative.   Physical Exam Updated Vital Signs BP (!) 162/137   Pulse (!) 110   Temp 97.9 F (36.6 C) (Oral)   Resp (!) 26   Ht 1.753 m (5\' 9" )   Wt 97.5 kg   SpO2 98%   BMI 31.75 kg/m   Physical Exam Vitals and nursing note reviewed.  Constitutional:  Appearance: He is well-developed. He is not diaphoretic.  HENT:     Head: Normocephalic and atraumatic.     Right Ear: External ear normal.     Left Ear: External ear normal.  Eyes:     General: No scleral icterus.       Right eye: No discharge.        Left eye: No discharge.     Conjunctiva/sclera: Conjunctivae normal.  Neck:     Trachea: No tracheal deviation.  Cardiovascular:     Rate and Rhythm: Regular rhythm. Tachycardia present.  Pulmonary:     Effort: Pulmonary effort is normal. Tachypnea present. No respiratory distress.     Breath sounds: Normal breath sounds. No stridor. No wheezing or rales.  Abdominal:     General: Bowel sounds are normal. There is no distension.     Palpations: Abdomen is soft.     Tenderness:  There is no abdominal tenderness. There is no guarding or rebound.  Musculoskeletal:        General: No tenderness.     Cervical back: Neck supple.  Skin:    General: Skin is warm and dry.     Findings: No rash.  Neurological:     Mental Status: He is alert.     Cranial Nerves: No cranial nerve deficit (no facial droop, extraocular movements intact, no slurred speech).     Sensory: No sensory deficit.     Motor: No abnormal muscle tone or seizure activity.     Coordination: Coordination normal.     ED Results / Procedures / Treatments   Labs (all labs ordered are listed, but only abnormal results are displayed) Labs Reviewed  BASIC METABOLIC PANEL - Abnormal; Notable for the following components:      Result Value   CO2 21 (*)    Glucose, Bld 121 (*)    Creatinine, Ser 1.85 (*)    Calcium 8.6 (*)    GFR calc non Af Amer 40 (*)    GFR calc Af Amer 46 (*)    All other components within normal limits  CBC - Abnormal; Notable for the following components:   WBC 11.5 (*)    Platelets 143 (*)    All other components within normal limits  BRAIN NATRIURETIC PEPTIDE - Abnormal; Notable for the following components:   B Natriuretic Peptide 637.0 (*)    All other components within normal limits  TROPONIN I (HIGH SENSITIVITY) - Abnormal; Notable for the following components:   Troponin I (High Sensitivity) 91 (*)    All other components within normal limits  CBC  HEPARIN LEVEL (UNFRACTIONATED)  POC SARS CORONAVIRUS 2 AG -  ED    EKG None EKG reviewed Sinus tachycardia rate 122 Right axis deviation Nonspecific ST and T T wave changes diffusely,    Dorie Rank, MD 11/03/19 1709 Radiology CT Angio Chest PE W and/or Wo Contrast  Result Date: 11/03/2019 CLINICAL DATA:  Progressive shortness of breath beginning 4 days after receiving his 2nd COVID vaccine. EXAM: CT ANGIOGRAPHY CHEST WITH CONTRAST TECHNIQUE: Multidetector CT imaging of the chest was performed using the standard  protocol during bolus administration of intravenous contrast. Multiplanar CT image reconstructions and MIPs were obtained to evaluate the vascular anatomy. CONTRAST:  130mL OMNIPAQUE IOHEXOL 350 MG/ML SOLN COMPARISON:  Portable chest obtained earlier today. Chest CTA dated 06/14/2016. FINDINGS: Cardiovascular: Multiple large central and peripheral pulmonary artery filling defects bilaterally. These are significantly restricting blood flow bilaterally, especially on the right.  The right ventricular to left ventricular ratio is 1.36. The heart remains mildly enlarged. Mediastinum/Nodes: No enlarged mediastinal, hilar, or axillary lymph nodes. Thyroid gland, trachea, and esophagus demonstrate no significant findings. Lungs/Pleura: Minimal bibasilar linear atelectasis or scarring. No pleural fluid. Upper Abdomen: There is some reflux of contrast into the inferior vena cava and hepatic veins. Musculoskeletal: Minimal thoracic spine degenerative changes. Review of the MIP images confirms the above findings. IMPRESSION: 1. Multiple large central and peripheral pulmonary emboli bilaterally, significantly restricting blood flow bilaterally, especially on the right. 2. Elevated right ventricular to left ventricular ratio. In view of the multiple large bilateral pulmonary emboli with restriction of blood flow and reflux contrast into the inferior vena cava and hepatic veins, this is compatible with right heart strain associated with at least submassive PE. 3. Stable mild cardiomegaly. Critical Value/emergent results were called by telephone at the time of interpretation on 11/03/2019 at 8:29 pm to provider [facility] , who verbally acknowledged these results. Electronically Signed   By: Claudie Revering M.D.   On: 11/03/2019 20:38   DG Chest Port 1 View  Result Date: 11/03/2019 CLINICAL DATA:  59 year old male with shortness of breath. EXAM: PORTABLE CHEST 1 VIEW COMPARISON:  Chest radiograph dated 06/14/2016 and CT dated  06/14/2016 FINDINGS: No focal consolidation, pleural effusion, pneumothorax. The cardiac silhouette is within normal limits. No acute osseous pathology. IMPRESSION: No active disease. Electronically Signed   By: Anner Crete M.D.   On: 11/03/2019 17:52    Procedures .Critical Care Performed by: Dorie Rank, MD Authorized by: Dorie Rank, MD   Critical care provider statement:    Critical care time (minutes):  40   Critical care was time spent personally by me on the following activities:  Discussions with consultants, evaluation of patient's response to treatment, examination of patient, ordering and performing treatments and interventions, ordering and review of laboratory studies, ordering and review of radiographic studies, pulse oximetry, re-evaluation of patient's condition, obtaining history from patient or surrogate and review of old charts   (including critical care time)  Medications Ordered in ED Medications  heparin ADULT infusion 100 units/mL (25000 units/245mL sodium chloride 0.45%) (1,550 Units/hr Intravenous New Bag/Given 11/03/19 2117)  iohexol (OMNIPAQUE) 350 MG/ML injection 100 mL (100 mLs Intravenous Contrast Given 11/03/19 2007)  heparin bolus via infusion 3,000 Units (3,000 Units Intravenous Bolus from Bag 11/03/19 2117)    ED Course  I have reviewed the triage vital signs and the nursing notes.  Pertinent labs & imaging results that were available during my care of the patient were reviewed by me and considered in my medical decision making (see chart for details).  Clinical Course as of Nov 02 2152  Sat Nov 03, 2019  1728 Symptoms concerning for possible PE.  CHF exacerbation also concern.  Covid unlikely but still a consideration.  We will proceed with laboratory test x-rays   [JK]  1851 Labs reviewed.  Troponin elevated.  BMP pending.  Covid is negative.   [RC]  7893 Chest x-ray without acute findings.  We will proceed with CTA to evaluate for possible  pulmonary embolism   [JK]  2039 Pt has elevated spesi.  Elevated biomarkers.  D/w Dr Oletta Darter.   WIll have PCCM team pt in the ED.     [JK]  2153 Pt seen by PCCM.  Ok to go to stepdown.  No plan for active intervention tonight.     [JK]    Clinical Course User Index [JK] Dorie Rank,  MD   MDM Rules/Calculators/A&P                      Patient presented to ED with complaints of shortness of breath.  Patient's laboratory tests were notable for elevated troponin as well as BNP.  Patient also remained hypertensive  tachycardic and tachypneic.  CT angiogram was performed.  Per radiology patient has extensive pulmonary embolism.  Patient also has a high risk pesi score.  I will consult pulmonary critical care.  IV heparin has been ordered.  Final Clinical Impression(s) / ED Diagnoses Final diagnoses:  Acute pulmonary embolism with acute cor pulmonale, unspecified pulmonary embolism type [facility])      Dorie Rank, MD 11/03/19 2154

## 2019-11-03 NOTE — ED Provider Notes (Signed)
EKG reviewed Sinus tachycardia rate 122 Right axis deviation Nonspecific ST and T T wave changes diffusely,    Dorie Rank, MD 11/03/19 1709

## 2019-11-03 NOTE — ED Triage Notes (Signed)
Patient reports that he had his 2nd dose of Covid vaccine 10/27/19 and developed 4 days started having SOB and getting progressively worse. Patient states he has been off of his  BP med x 9 months due to no insurance. Patient has a history of PE's. Patient states he was on eliquis, but physician took him off approx. 9 months ago.

## 2019-11-04 ENCOUNTER — Inpatient Hospital Stay (HOSPITAL_COMMUNITY): Payer: Medicaid Other

## 2019-11-04 ENCOUNTER — Inpatient Hospital Stay (HOSPITAL_COMMUNITY): Payer: Self-pay

## 2019-11-04 DIAGNOSIS — I2602 Saddle embolus of pulmonary artery with acute cor pulmonale: Secondary | ICD-10-CM

## 2019-11-04 DIAGNOSIS — N1831 Chronic kidney disease, stage 3a: Secondary | ICD-10-CM

## 2019-11-04 DIAGNOSIS — I2699 Other pulmonary embolism without acute cor pulmonale: Secondary | ICD-10-CM

## 2019-11-04 DIAGNOSIS — I469 Cardiac arrest, cause unspecified: Secondary | ICD-10-CM

## 2019-11-04 DIAGNOSIS — I10 Essential (primary) hypertension: Secondary | ICD-10-CM

## 2019-11-04 LAB — BASIC METABOLIC PANEL
Anion gap: 8 (ref 5–15)
BUN: 21 mg/dL — ABNORMAL HIGH (ref 6–20)
CO2: 21 mmol/L — ABNORMAL LOW (ref 22–32)
Calcium: 8.3 mg/dL — ABNORMAL LOW (ref 8.9–10.3)
Chloride: 112 mmol/L — ABNORMAL HIGH (ref 98–111)
Creatinine, Ser: 1.76 mg/dL — ABNORMAL HIGH (ref 0.61–1.24)
GFR calc Af Amer: 49 mL/min — ABNORMAL LOW (ref >60)
GFR calc non Af Amer: 42 mL/min — ABNORMAL LOW (ref >60)
Glucose, Bld: 98 mg/dL (ref 70–99)
Potassium: 4.8 mmol/L (ref 3.5–5.1)
Sodium: 141 mmol/L (ref 135–145)

## 2019-11-04 LAB — HIV ANTIBODY (ROUTINE TESTING W REFLEX): HIV Screen 4th Generation wRfx: NONREACTIVE

## 2019-11-04 LAB — CBC
HCT: 41.6 % (ref 39.0–52.0)
Hemoglobin: 14 g/dL (ref 13.0–17.0)
MCH: 27.4 pg (ref 26.0–34.0)
MCHC: 33.7 g/dL (ref 30.0–36.0)
MCV: 81.4 fL (ref 80.0–100.0)
Platelets: 150 10*3/uL (ref 150–400)
RBC: 5.11 MIL/uL (ref 4.22–5.81)
RDW: 14.8 % (ref 11.5–15.5)
WBC: 11.7 10*3/uL — ABNORMAL HIGH (ref 4.0–10.5)
nRBC: 0 % (ref 0.0–0.2)

## 2019-11-04 LAB — COMPREHENSIVE METABOLIC PANEL
ALT: 31 U/L (ref 0–44)
AST: 46 U/L — ABNORMAL HIGH (ref 15–41)
Albumin: 2.8 g/dL — ABNORMAL LOW (ref 3.5–5.0)
Alkaline Phosphatase: 61 U/L (ref 38–126)
Anion gap: 12 (ref 5–15)
BUN: 23 mg/dL — ABNORMAL HIGH (ref 6–20)
CO2: 18 mmol/L — ABNORMAL LOW (ref 22–32)
Calcium: 7.9 mg/dL — ABNORMAL LOW (ref 8.9–10.3)
Chloride: 107 mmol/L (ref 98–111)
Creatinine, Ser: 1.93 mg/dL — ABNORMAL HIGH (ref 0.61–1.24)
GFR calc Af Amer: 44 mL/min — ABNORMAL LOW (ref >60)
GFR calc non Af Amer: 38 mL/min — ABNORMAL LOW (ref >60)
Glucose, Bld: 136 mg/dL — ABNORMAL HIGH (ref 70–99)
Potassium: 4.2 mmol/L (ref 3.5–5.1)
Sodium: 137 mmol/L (ref 135–145)
Total Bilirubin: 1.4 mg/dL — ABNORMAL HIGH (ref 0.3–1.2)
Total Protein: 6.3 g/dL — ABNORMAL LOW (ref 6.5–8.1)

## 2019-11-04 LAB — CBC WITH DIFFERENTIAL/PLATELET
Abs Immature Granulocytes: 0.15 10*3/uL — ABNORMAL HIGH (ref 0.00–0.07)
Basophils Absolute: 0.1 10*3/uL (ref 0.0–0.1)
Basophils Relative: 0 %
Eosinophils Absolute: 0.1 10*3/uL (ref 0.0–0.5)
Eosinophils Relative: 0 %
HCT: 43.3 % (ref 39.0–52.0)
Hemoglobin: 14.3 g/dL (ref 13.0–17.0)
Immature Granulocytes: 1 %
Lymphocytes Relative: 14 %
Lymphs Abs: 2 10*3/uL (ref 0.7–4.0)
MCH: 27.6 pg (ref 26.0–34.0)
MCHC: 33 g/dL (ref 30.0–36.0)
MCV: 83.4 fL (ref 80.0–100.0)
Monocytes Absolute: 1.4 10*3/uL — ABNORMAL HIGH (ref 0.1–1.0)
Monocytes Relative: 11 %
Neutro Abs: 10.1 10*3/uL — ABNORMAL HIGH (ref 1.7–7.7)
Neutrophils Relative %: 74 %
Platelets: 102 10*3/uL — ABNORMAL LOW (ref 150–400)
RBC: 5.19 MIL/uL (ref 4.22–5.81)
RDW: 14.9 % (ref 11.5–15.5)
WBC: 13.8 10*3/uL — ABNORMAL HIGH (ref 4.0–10.5)
nRBC: 0.1 % (ref 0.0–0.2)

## 2019-11-04 LAB — HEPARIN LEVEL (UNFRACTIONATED)
Heparin Unfractionated: <0.1 IU/mL — ABNORMAL LOW (ref 0.30–0.70)
Heparin Unfractionated: 0.3 IU/mL (ref 0.30–0.70)
Heparin Unfractionated: 0.28 IU/mL — ABNORMAL LOW (ref 0.30–0.70)

## 2019-11-04 LAB — MAGNESIUM: Magnesium: 2.1 mg/dL (ref 1.7–2.4)

## 2019-11-04 LAB — ECHOCARDIOGRAM COMPLETE
Height: 69 in
Weight: 3296 oz

## 2019-11-04 LAB — PROTIME-INR
INR: 2 — ABNORMAL HIGH (ref 0.8–1.2)
Prothrombin Time: 22.5 seconds — ABNORMAL HIGH (ref 11.4–15.2)

## 2019-11-04 LAB — LACTIC ACID, PLASMA: Lactic Acid, Venous: 3.7 mmol/L (ref 0.5–1.9)

## 2019-11-04 LAB — APTT: aPTT: 63 seconds — ABNORMAL HIGH (ref 24–36)

## 2019-11-04 MED ORDER — CHLORHEXIDINE GLUCONATE CLOTH 2 % EX PADS
6.0000 | MEDICATED_PAD | Freq: Every day | CUTANEOUS | Status: DC
  Administered 2019-11-04 – 2019-11-06 (×2): 6 via TOPICAL

## 2019-11-04 MED ORDER — HYDRALAZINE HCL 20 MG/ML IJ SOLN
2.0000 mg | INTRAMUSCULAR | Status: AC | PRN
  Administered 2019-11-05 – 2019-11-06 (×3): 2 mg via INTRAVENOUS
  Filled 2019-11-04 (×3): qty 1

## 2019-11-04 MED ORDER — HEPARIN (PORCINE) 25000 UT/250ML-% IV SOLN
1900.0000 [IU]/h | INTRAVENOUS | Status: DC
  Administered 2019-11-04 – 2019-11-05 (×2): 1500 [IU]/h via INTRAVENOUS
  Administered 2019-11-05: 1900 [IU]/h via INTRAVENOUS
  Filled 2019-11-04 (×3): qty 250

## 2019-11-04 MED ORDER — AMLODIPINE BESYLATE 10 MG PO TABS
10.0000 mg | ORAL_TABLET | Freq: Once | ORAL | Status: AC
  Administered 2019-11-04: 10 mg via ORAL
  Filled 2019-11-04: qty 1

## 2019-11-04 MED ORDER — ORAL CARE MOUTH RINSE
15.0000 mL | Freq: Two times a day (BID) | OROMUCOSAL | Status: DC
  Administered 2019-11-04: 15 mL via OROMUCOSAL

## 2019-11-04 MED ORDER — SODIUM CHLORIDE 0.9 % IV SOLN
250.0000 mL | Freq: Once | INTRAVENOUS | Status: AC
  Administered 2019-11-04: 250 mL via INTRAVENOUS

## 2019-11-04 MED ORDER — ALTEPLASE (PULMONARY EMBOLISM) INFUSION
100.0000 mg | Freq: Once | INTRAVENOUS | Status: AC
  Administered 2019-11-04: 100 mg via INTRAVENOUS
  Filled 2019-11-04: qty 100

## 2019-11-04 NOTE — ED Notes (Signed)
Dr. Alcario Drought notified of pt's BP. No new orders received. Will continue to monitor.

## 2019-11-04 NOTE — Progress Notes (Signed)
  Echocardiogram 2D Echocardiogram has been performed.  Marybelle Killings 11/04/2019, 10:16 AM

## 2019-11-04 NOTE — Progress Notes (Signed)
VASCULAR LAB PRELIMINARY  PRELIMINARY  PRELIMINARY  PRELIMINARY  Bilateral lower extremity venous duplex completed.    Preliminary report:  See CV proc for preliminary results.   Hamed Debella, RVT 11/04/2019, 1:02 PM

## 2019-11-04 NOTE — Progress Notes (Signed)
Chaplain provided support to patient's wife during code blue at 3:24 PM .  Chaplain will refer patient to unit chaplain, Genesis Adams. Patient and wife were very receptive during to [employer].  Rev. Tamsen Snider Pager [phone]

## 2019-11-04 NOTE — Progress Notes (Signed)
[facility] for tPA>>heparin Indication: pulmonary embolus   Assessment: 69 yoM admitted with SOB found to have bilateral PE and atrial thrombus. Pt has hx VTE but has not taken any anticoagulants in months due to cost. Pt initially started on IV heparin, now receiving IV tPA. Pharmacy asked to resume heparin afterwards. Will check coags 30 minutes after lysis complete and target lower heparin level goal x24h.  Aptt resulted 63s so will initiate heparin infusion now. Was therapeutic previously on 1550 units/hr, will restart at slightly lower rate post tpa then increase as indicated.  Initial heparin level 0.28 units/ml  Goal of Therapy:  Heparin level 0.3-0.7 units/ml (0.3-0.5 x24h) Monitor platelets by anticoagulation protocol: Yes   Plan:  Continue heparin at 1500 units/hr Heparin level with am labs  Excell Seltzer, PharmD Clinical Pharmacist 11/04/2019 10:27 PM

## 2019-11-04 NOTE — Progress Notes (Addendum)
[facility] for heparin Indication: pulmonary embolus  No Known Allergies  Patient Measurements: Height: 5\' 9"  (175.3 cm) Weight: 93.4 kg (206 lb) IBW/kg (Calculated) : 70.7 Heparin Dosing Weight: 91 kg  Vital Signs: Temp: 97.9 F (36.6 C) (04/18 1200) Temp Source: Oral (04/18 1200) BP: 116/97 (04/18 1230) Pulse Rate: 88 (04/18 1230)  Labs: Recent Labs    11/03/19 1722 11/04/19 0400 11/04/19 0635  HGB 14.4 14.0  --   HCT 43.6 41.6  --   PLT 143* 150  --   HEPARINUNFRC  --   --  0.30  CREATININE 1.85* 1.76*  --   TROPONINIHS 91*  --   --     Estimated Creatinine Clearance: 52.3 mL/min (A) (by C-G formula based on SCr of 1.76 mg/dL (H)).   Medical History: Past Medical History:  Diagnosis Date  . DVT (deep venous thrombosis) ([place])   . Hypertension 1997   has never taken medications  . Tobacco dependence      Assessment: 84 yoM admitted with SOB found to have bilateral PE and atrial thrombus. Pt has hx VTE but has not taken any anticoagulants in months due to cost. Pt initially started on IV heparin, now receiving IV tPA. Pharmacy asked to resume heparin afterwards. Will check coags 30 minutes after lysis complete and target lower heparin level goal x24h.   Goal of Therapy:  Heparin level 0.3-0.7 units/ml (0.3-0.5 x24h) Monitor platelets by anticoagulation protocol: Yes   Plan:  -Holding heparin for now, tPA 100mg  over 2h -Check aPTT 30 min after infusion, once <80s restart IV heparin   Arrie Senate, PharmD, BCPS Clinical Pharmacist [phone] Please check AMION for all [place] numbers 11/04/2019

## 2019-11-04 NOTE — Consult Note (Signed)
Advanced Heart Failure/ECMO Team Consult Note   Primary Physician: Patient, No Pcp Per PCP-Cardiologist:  No primary care provider on file.  Referring Dr. Tamala Julian   Reason for Consultation: Cardiac arrest in setting of submassive PE  HPI:    Mitchell Cooper is seen today for evaluation of cardiac arrest in setting of submassive PE at the request of Dr. Tamala Julian.  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  Last PE was approximately 3 years ago in 2017 and patient states that he stopped taking Eliquis approximately 9 months ago because he could not afford it.  Received his second Covid-19 vaccination several days before noting progressive shortness of breath.  No fevers, chills, coughing, URI symptoms, chest pain or leg pain and swelling. Reports increasing SOB over last week. No hemoptysis.   Presented to ER 4/17. On arrival was tachycardic in the 130s, tachypneic and oxygen saturations as low as 91% so he was placed on 3 L nasal cannula.  He has not been hypotensive.  BNP 637, high-sensitivity troponin 91, creatinine 1.8.  CTA was obtained showing multiple large central and peripheral PEs with filling defects bilaterally and RV strain. CCM consulted  Echo showed LVEF 65% with compressed LV. RV markedly dilated with severe RV dysfunction and large mobile clot extending from IVC into RA bobbing across TV. RVSP ~55mmHG  Patient received T-PA about 2.5 hours into 3 hour T-PA infusion patient developed PEA arrest and was resuscitated in 30 seconds. ECMO consult called myself and Dr. Prescott Gum at bedside.   Now awake and on non-rebreather. Denies CP. Says breathing feels better.   I repeated echo. IVC/RA clot no longer present. Sats 100% on HFNC. RR 20-30. SBP 140   Review of Systems: [y] = yes, [ ]  = no   . General:  Weight gain [ ] ; Weight loss [ ] ; Anorexia [ ] ; Fatigue [ ] ; Fever [ ] ; Chills [ ] ; Weakness [ ]   . Cardiac: Chest pain/pressure [ ] ; Resting SOB Blue.Reese ]; Exertional SOB [ y]; Orthopnea [ ] ; Pedal Edema [ ] ; Palpitations [ ] ; Syncope [ ] ; Presyncope [ ] ; Paroxysmal nocturnal dyspnea[ ]   . Pulmonary: Cough Blue.Reese ]; Wheezing[ ] ; Hemoptysis[ ] ; Sputum [ ] ; Snoring [ ]   . GI: Vomiting[ ] ; Dysphagia[ ] ; Melena[ ] ; Hematochezia [ ] ; Heartburn[ ] ; Abdominal pain [ ] ; Constipation [ ] ; Diarrhea [ ] ; BRBPR [ ]   . GU: Hematuria[ ] ; Dysuria [ ] ; Nocturia[ ]   . Vascular: Pain in legs with walking [ ] ; Pain in feet with lying flat [ ] ; Non-healing sores [ ] ; Stroke [ ] ; TIA [ ] ; Slurred speech [ ] ;  . Neuro: Headaches[ ] ; Vertigo[ ] ; Seizures[ ] ; Paresthesias[ ] ;Blurred vision [ ] ; Diplopia [ ] ; Vision changes [ ]   . Ortho/Skin: Arthritis Blue.Reese ]; Joint pain Blue.Reese ]; Muscle pain [ ] ; Joint swelling [ ] ; Back Pain [ ] ; Rash [ ]   . Psych: Depression[ ] ; Anxiety[ ]   . Heme: Bleeding problems [ ] ; Clotting disorders [ ] ; Anemia [ ]   . Endocrine: Diabetes [ ] ; Thyroid dysfunction[ ]   Home Medications Prior to Admission medications   Medication Sig Start Date End Date Taking? Authorizing Provider  amLODipine (NORVASC) 10 MG tablet Take 1 tablet (10 mg total) by mouth daily. 12/10/18   Langston Masker B, PA-C  apixaban (  ELIQUIS) 5 MG TABS tablet 10 mg BID x 14 doses then 5 mg BID Patient not taking: Reported on 02/21/2017 06/17/16   Geradine Girt, DO  aspirin EC 325 MG tablet Take 1 tablet (325 mg total) by mouth daily. 06/17/16   Geradine Girt, DO    Past Medical History: Past Medical History:  Diagnosis Date  . DVT (deep venous thrombosis) ([place])   . Hypertension 1997   has never taken medications  . Tobacco dependence     Past Surgical History: Past Surgical History:  Procedure Laterality Date  . KNEE SURGERY  approx. 1984   reconstructive surgery of left knee; arthroscopic procedure about 1997  . PERIPHERAL  VASCULAR CATHETERIZATION Left 06/16/2016   Procedure: Lower Extremity Venography- Venus Thrombolysis;  Surgeon: Serafina Mitchell, MD;  Location: [facility];  Service: Cardiovascular;  Laterality: Left;    Family History: Family History  Problem Relation Age of Onset  . CAD Mother 67    Social History: Social History   Socioeconomic History  . Marital status: Married    Spouse name: Not on file  . Number of children: Not on file  . Years of education: Not on file  . Highest education level: Not on file  Occupational History  . Occupation: Architect  Tobacco Use  . Smoking status: Former Smoker    Types: Cigarettes    Quit date: 11/21/2016    Years since quitting: 2.9  . Smokeless tobacco: Never Used  Substance and Sexual Activity  . Alcohol use: Yes    Alcohol/week: 6.0 standard drinks    Types: 6 Cans of beer per week  . Drug use: No  . Sexual activity: Not on file  Other Topics Concern  . Not on file  Social History Narrative  . Not on file   Social Determinants of Health   Financial Resource Strain:   . Difficulty of Paying Living Expenses:   Food Insecurity:   . Worried About Charity fundraiser in the Last Year:   . Arboriculturist in the Last Year:   Transportation Needs:   . Film/video editor (Medical):   [name] Lack of Transportation (Non-Medical):   Physical Activity:   . Days of Exercise per Week:   . Minutes of Exercise per Session:   Stress:   . Feeling of Stress :   Social Connections:   . Frequency of Communication with Friends and Family:   . Frequency of Social Gatherings with Friends and Family:   . Attends Religious Services:   . Active Member of Clubs or Organizations:   . Attends Archivist Meetings:   [name] Marital Status:     Allergies:  No Known Allergies  Objective:    Vital Signs:   Temp:  [97.9 F (36.6 C)-98.2 F (36.8 C)] 97.9 F (36.6 C) (04/18 1200) Pulse Rate:  [88-123] 102 (04/18 1500) Resp:  [20-42]  20 (04/18 1500) BP: (116-172)/(92-151) 128/101 (04/18 1500) SpO2:  [87 %-100 %] 98 % (04/18 1500) Weight:  [93.4 kg-97.5 kg] 93.4 kg (04/18 0438) Last BM Date: 11/01/19  Weight change: Filed Weights   11/03/19 1708 11/04/19 0438  Weight: 97.5 kg 93.4 kg    Intake/Output:   Intake/Output Summary (Last 24 hours) at 11/04/2019 1508 Last data filed at 11/04/2019 1400 Gross per 24 hour  Intake 1484.05 ml  Output --  Net 1484.05 ml      Physical Exam    General:  Obese  male sitting up in bed on HFNC HEENT: normal Neck: supple. JVP to jaw. Carotids 2+ bilat; no bruits. No lymphadenopathy or thyromegaly appreciated. Cor: PMI nondisplaced. Regular tachy 2/6 TR Lungs: clear Abdomen: obese soft, nontender, nondistended. No hepatosplenomegaly. No bruits or masses. Good bowel sounds. Extremities: no cyanosis, clubbing, rash, edema. warm Neuro: alert & orientedx3, cranial nerves grossly intact. moves all 4 extremities w/o difficulty. Affect pleasant   Telemetry   Sinus tach 100-110 Personally reviewed   Labs   Basic Metabolic Panel: Recent Labs  Lab 11/03/19 1722 11/04/19 0400  NA 141 141  K 4.1 4.8  CL 109 112*  CO2 21* 21*  GLUCOSE 121* 98  BUN 20 21*  CREATININE 1.85* 1.76*  CALCIUM 8.6* 8.3*    Liver Function Tests: No results for input(s): AST, ALT, ALKPHOS, BILITOT, PROT, ALBUMIN in the last 168 hours. No results for input(s): LIPASE, AMYLASE in the last 168 hours. No results for input(s): AMMONIA in the last 168 hours.  CBC: Recent Labs  Lab 11/03/19 1722 11/04/19 0400  WBC 11.5* 11.7*  HGB 14.4 14.0  HCT 43.6 41.6  MCV 81.3 81.4  PLT 143* 150    Cardiac Enzymes: No results for input(s): CKTOTAL, CKMB, CKMBINDEX, TROPONINI in the last 168 hours.  BNP: BNP (last 3 results) Recent Labs    11/03/19 1722  BNP 637.0*    ProBNP (last 3 results) No results for input(s): PROBNP in the last 8760 hours.   CBG: No results for input(s): GLUCAP in  the last 168 hours.  Coagulation Studies: Recent Labs    11/04/19 1414  LABPROT 22.5*  INR 2.0*     Imaging   CT Angio Chest PE W and/or Wo Contrast  Result Date: 11/03/2019 CLINICAL DATA:  Progressive shortness of breath beginning 4 days after receiving his 2nd COVID vaccine. EXAM: CT ANGIOGRAPHY CHEST WITH CONTRAST TECHNIQUE: Multidetector CT imaging of the chest was performed using the standard protocol during bolus administration of intravenous contrast. Multiplanar CT image reconstructions and MIPs were obtained to evaluate the vascular anatomy. CONTRAST:  155mL OMNIPAQUE IOHEXOL 350 MG/ML SOLN COMPARISON:  Portable chest obtained earlier today. Chest CTA dated 06/14/2016. FINDINGS: Cardiovascular: Multiple large central and peripheral pulmonary artery filling defects bilaterally. These are significantly restricting blood flow bilaterally, especially on the right. The right ventricular to left ventricular ratio is 1.36. The heart remains mildly enlarged. Mediastinum/Nodes: No enlarged mediastinal, hilar, or axillary lymph nodes. Thyroid gland, trachea, and esophagus demonstrate no significant findings. Lungs/Pleura: Minimal bibasilar linear atelectasis or scarring. No pleural fluid. Upper Abdomen: There is some reflux of contrast into the inferior vena cava and hepatic veins. Musculoskeletal: Minimal thoracic spine degenerative changes. Review of the MIP images confirms the above findings. IMPRESSION: 1. Multiple large central and peripheral pulmonary emboli bilaterally, significantly restricting blood flow bilaterally, especially on the right. 2. Elevated right ventricular to left ventricular ratio. In view of the multiple large bilateral pulmonary emboli with restriction of blood flow and reflux contrast into the inferior vena cava and hepatic veins, this is compatible with right heart strain associated with at least submassive PE. 3. Stable mild cardiomegaly. Critical Value/emergent results  were called by telephone at the time of interpretation on 11/03/2019 at 8:29 pm to provider [facility] , who verbally acknowledged these results. Electronically Signed   By: Claudie Revering M.D.   On: 11/03/2019 20:38   DG Chest Port 1 View  Result Date: 11/03/2019 CLINICAL DATA:  59 year old male with shortness of breath.  EXAM: PORTABLE CHEST 1 VIEW COMPARISON:  Chest radiograph dated 06/14/2016 and CT dated 06/14/2016 FINDINGS: No focal consolidation, pleural effusion, pneumothorax. The cardiac silhouette is within normal limits. No acute osseous pathology. IMPRESSION: No active disease. Electronically Signed   By: Anner Crete M.D.   On: 11/03/2019 17:52   ECHOCARDIOGRAM COMPLETE  Result Date: 11/04/2019    ECHOCARDIOGRAM REPORT   Patient Name:   MOHSIN CRUM Date of Exam: 11/04/2019 Medical Rec #:  [id number]         Height:       69.0 in Accession #:    [id number]        Weight:       206.0 lb Date of Birth:  October 28, 1960        BSA:          2.092 m Patient Age:    49 years          BP:           145/119 mmHg Patient Gender: M                 HR:           103 bpm. Exam Location:  Inpatient Procedure: 2D Echo, Cardiac Doppler and Color Doppler          REPORT CONTAINS CRITICAL RESULT  Critical result called to Dr. [place] at 10:18AM. Indications:    Pulmonary embolus  History:        Patient has prior history of Echocardiogram examinations. Risk                 Factors:Hypertension and Former Smoker. CKD. H/o DVT and                 pulmonary embolus.  Sonographer:    Clayton Lefort RDCS (AE) Referring Phys: [place]  1. Large right atrial mass that prolapses across tricuspid valve and extends into IVC, measures 5.4cm x 1.9 cm. Consistent with thrombus in transit.  2. RV free wall akinesis with improved function at apex, consistent with McConnell's sign as can be seen in acute PE. Right ventricular systolic function is severely reduced. The right ventricular size is moderately  enlarged. There is moderately elevated pulmonary artery systolic pressure. The estimated right ventricular systolic pressure is 61.4 mmHg.  3. Left ventricular ejection fraction, by estimation, is 60 to 65%. The left ventricle has normal function. The left ventricle has no regional wall motion abnormalities. There is mild concentric left ventricular hypertrophy. Left ventricular diastolic parameters are indeterminate. There is the interventricular septum is flattened in systole and diastole, consistent with right ventricular pressure and volume overload.  4. Right atrial size was moderately dilated.  5. The mitral valve is normal in structure. No evidence of mitral valve regurgitation.  6. The aortic valve is tricuspid. Aortic valve regurgitation is not visualized. No aortic stenosis is present.  7. The inferior vena cava is dilated in size with <50% respiratory variability, suggesting right atrial pressure of 15 mmHg. FINDINGS  Left Ventricle: Left ventricular ejection fraction, by estimation, is 60 to 65%. The left ventricle has normal function. The left ventricle has no regional wall motion abnormalities. The left ventricular internal cavity size was normal in size. There is  mild concentric left ventricular hypertrophy. The interventricular septum is flattened in systole and diastole, consistent with right ventricular pressure and volume overload. Left ventricular diastolic parameters are indeterminate. Right Ventricle: RV free wall akinesis with improved function  at apex, consistent with McConnell's sign as can be seen in acute PE. The right ventricular size is moderately enlarged. Right vetricular wall thickness was not assessed. Right ventricular systolic function is severely reduced. There is moderately elevated pulmonary artery systolic pressure. The tricuspid regurgitant velocity is 3.52 m/s, and with an assumed right atrial pressure of 15 mmHg, the estimated right ventricular systolic pressure is 16.1  mmHg. Left Atrium: Left atrial size was normal in size. 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 Thrombus extends into IVC. Consistent with thrombus in transit. Right atrial size was moderately dilated. Pericardium: There is no evidence of pericardial effusion. Mitral Valve: The mitral valve is normal in structure. No evidence of mitral valve regurgitation. Tricuspid Valve: The tricuspid valve is normal in structure. Tricuspid valve regurgitation is mild. Aortic Valve: The aortic valve is tricuspid. Aortic valve regurgitation is not visualized. No aortic stenosis is present. Aortic valve mean gradient measures 3.0 mmHg. Aortic valve peak gradient measures 6.5 mmHg. Aortic valve area, by VTI measures 2.27 cm. Pulmonic Valve: The pulmonic valve was not well visualized. Pulmonic valve regurgitation is not visualized. Aorta: The aortic root and ascending aorta are structurally normal, with no evidence of dilitation. Venous: The inferior vena cava is dilated in size with less than 50% respiratory variability, suggesting right atrial pressure of 15 mmHg. IAS/Shunts: The interatrial septum was not well visualized.  LEFT VENTRICLE PLAX 2D LVIDd:         4.00 cm  Diastology LVIDs:         2.30 cm  LV e' lateral: 7.40 cm/s LV PW:         1.30 cm  LV e' medial:  7.18 cm/s LV IVS:        1.30 cm LVOT diam:     2.20 cm LV SV:         42 LV SV Index:   20 LVOT Area:     3.80 cm  RIGHT VENTRICLE             IVC RV Basal diam:  4.40 cm     IVC diam: 2.30 cm RV Mid diam:    4.90 cm RV S prime:     10.70 cm/s TAPSE (M-mode): 1.7 cm LEFT ATRIUM             Index       RIGHT ATRIUM           Index LA diam:        2.40 cm 1.15 cm/m  RA Area:     24.40 cm LA Vol (A2C):   54.8 ml 26.19 ml/m RA Volume:   89.00 ml  42.54 ml/m LA Vol (A4C):   33.4 ml 15.96 ml/m LA Biplane Vol: 45.3 ml 21.65 ml/m  AORTIC VALVE AV Area (Vmax):    2.21 cm AV Area (Vmean):   2.02 cm AV Area  (VTI):     2.27 cm AV Vmax:           127.00 cm/s AV Vmean:          87.200 cm/s AV VTI:            0.186 m AV Peak Grad:      6.5 mmHg AV Mean Grad:      3.0 mmHg LVOT Vmax:         73.80 cm/s LVOT Vmean:        46.300 cm/s LVOT VTI:  0.111 m LVOT/AV VTI ratio: 0.60  AORTA Ao Root diam: 3.40 cm Ao Asc diam:  3.40 cm TRICUSPID VALVE TR Peak grad:   49.6 mmHg TR Vmax:        352.00 cm/s  SHUNTS Systemic VTI:  0.11 m Systemic Diam: 2.20 cm Oswaldo Milian MD Electronically signed by Oswaldo Milian MD Signature Date/Time: 11/04/2019/10:40:44 AM    Final    VAS [place] LOWER EXTREMITY VENOUS (DVT)  Result Date: 11/04/2019  Lower Venous DVTStudy Indications: Pulmonary embolism, and submassive PE with clot in transit noted in right heart.  Risk Factors: May Thurner, stent to left common iliac. Patient stopped taking Eliquis 9 months ago, secondary to cost. Comparison Study: Prior study from 02/21/17 is available for comparison Performing Technologist: Sharion Dove RVS  Examination Guidelines: A complete evaluation includes B-mode imaging, spectral Doppler, color Doppler, and power Doppler as needed of all accessible portions of each vessel. Bilateral testing is considered an integral part of a complete examination. Limited examinations for reoccurring indications may be performed as noted. The reflux portion of the exam is performed with the patient in reverse Trendelenburg.  +---------+---------------+---------+-----------+----------+-------------------+ RIGHT    CompressibilityPhasicitySpontaneityPropertiesThrombus Aging      +---------+---------------+---------+-----------+----------+-------------------+ CFV      Full           Yes      Yes                  sluggish flow       +---------+---------------+---------+-----------+----------+-------------------+ SFJ      Full                                                              +---------+---------------+---------+-----------+----------+-------------------+ FV Prox  Full                                                             +---------+---------------+---------+-----------+----------+-------------------+ FV Mid   Full                                                             +---------+---------------+---------+-----------+----------+-------------------+ FV DistalFull                                                             +---------+---------------+---------+-----------+----------+-------------------+ PFV      Full                                                             +---------+---------------+---------+-----------+----------+-------------------+ POP      Full  Yes      Yes                                      +---------+---------------+---------+-----------+----------+-------------------+ PTV      Full                                                             +---------+---------------+---------+-----------+----------+-------------------+ PERO     Full                                                             +---------+---------------+---------+-----------+----------+-------------------+ CIV                                                   patent by color and                                                       Doppler             +---------+---------------+---------+-----------+----------+-------------------+   +---------+---------------+---------+-----------+----------+-------------------+ LEFT     CompressibilityPhasicitySpontaneityPropertiesThrombus Aging      +---------+---------------+---------+-----------+----------+-------------------+ CFV      Full           Yes      Yes                                      +---------+---------------+---------+-----------+----------+-------------------+ SFJ      Full                                                              +---------+---------------+---------+-----------+----------+-------------------+ FV Prox  Full                                                             +---------+---------------+---------+-----------+----------+-------------------+ FV Mid   Partial                                                          +---------+---------------+---------+-----------+----------+-------------------+ FV DistalFull           Yes      Yes                                      +---------+---------------+---------+-----------+----------+-------------------+  PFV      Full                                                             +---------+---------------+---------+-----------+----------+-------------------+ POP      Partial                                      recanalized flow,                                                         chronic             +---------+---------------+---------+-----------+----------+-------------------+ PTV      Full                                                             +---------+---------------+---------+-----------+----------+-------------------+ PERO     Full                                                             +---------+---------------+---------+-----------+----------+-------------------+     Summary: RIGHT: - Findings appear essentially unchanged compared to previous examination. - There is no evidence of deep vein thrombosis in the lower extremity.  LEFT: - There is recanalized thrombus in the left popliteal vein(s). - Findings consistent with chronic deep vein thrombosis involving the left femoral vein, and left popliteal vein. - Findings appear essentially unchanged compared to previous examination.  *See table(s) above for measurements and observations. Electronically signed by Monica Martinez MD on 11/04/2019 at 3:04:16 PM.    Final       Medications:     Current Medications: . Chlorhexidine Gluconate Cloth  6  each Topical Daily  . mouth rinse  15 mL Mouth Rinse BID     Infusions: . sodium chloride Stopped (11/04/19 1356)     Assessment/Plan   1. PEA arrest - suspect triggered by lysis of RA clot and embolization of fragments into PA. Now stable  2. Submassive PE with severe RV strain - previous unprovoked clot in 2017. Now off NOAC x 9 months due to cost - recent COVID illness (11/20) and COVID vaccine as potential triggers - CT shows submassive bilateral PE with RV strain - initial echo LVEF 60-65% with severe RV dilation and dysfunction + clot in transit - s/p 100mg  systemic T-PA 4/18 - repeat echo shows resolution of clot in transit. RV strain slightly improved - symptomatically improved. Will continue heparin.  - No need for ECMO currently - d/w ECMO coordinators and surgeons personally - u/s LE. If residual clot with need IVC filter - lifelong AC  3. Acute hypoxic respiratory failure - due to #2 - continue  supplemental O2. Improving with rx of PE.   4. HTN - continue to follow. Do not treat aggressively at this time  5. AKI on CKD 3a - likely due to RV strain +/- contrast. Follow   CRITICAL CARE Performed by: Glori Bickers  Total critical care time: 45 minutes  Critical care time was exclusive of separately billable procedures and treating other patients.  Critical care was necessary to treat or prevent imminent or life-threatening deterioration.  Critical care was time spent personally by me (independent of midlevel providers or residents) on the following activities: development of treatment plan with patient and/or surrogate as well as nursing, discussions with consultants, evaluation of patient's response to treatment, examination of patient, obtaining history from patient or surrogate, ordering and performing treatments and interventions, ordering and review of laboratory studies, ordering and review of radiographic studies, pulse oximetry and re-evaluation of  patient's condition.    Length of Stay: 1  Glori Bickers, MD  11/04/2019, 3:08 PM  Advanced Heart Failure Team Pager [phone] (M-F; 7a - 4p)  Please contact [facility] for night-coverage after hours (4p -7a ) and weekends on [email or web address]

## 2019-11-04 NOTE — Consult Note (Signed)
Chief Complaint: Patient was seen in consultation today for PE/catheter directed thrombolysis.  Referring Physician(s): Tyna Jaksch (CCM)  Supervising Physician: Corrie Mckusick  Patient Status: [facility] - In-pt  History of Present Illness: Mitchell Cooper is a 59 y.o. male with a past medical history of hypertension, DVT, and tobacco dependence. He presented to [facility] ED 11/03/2019 with complaint of progressive dyspnea x days. In ED, CTA revealed bilateral pulmonary emboli with elevated RV/LV ratio.[name] He was started on IV Heparin and admitted for further management. Echo and VAS US DVT study pending. CCM was consulted who recommended transfer to [facility] for escalation of care, along with IR consultation for possible catheter directed thrombolysis.  CTA chest 11/03/2019: 1. Multiple large central and peripheral pulmonary emboli bilaterally, significantly restricting blood flow bilaterally, especially on the right. 2. Elevated right ventricular to left ventricular ratio. In view of the multiple large bilateral pulmonary emboli with restriction of blood flow and reflux contrast into the inferior vena cava and hepatic veins, this is compatible with right heart strain associated with at least submassive PE. 3. Stable mild cardiomegaly.  IR consulted by Dr. Orpah Melter for possible image-guided pulmonary arteriogram with possible catheter directed thrombolysis. Patient awake and alert laying in bed. Complains of dyspnea, stable since admission. Complains of left-sided chest pain when taking a deep breath, stable since admission. Denies fever, chills, abdominal pain, or headache.   Past Medical History:  Diagnosis Date  . DVT (deep venous thrombosis) ([place])   . Hypertension 1997   has never taken medications  . Tobacco dependence     Past Surgical History:  Procedure Laterality Date  . KNEE SURGERY  approx. 1984   reconstructive surgery of left knee; arthroscopic procedure about 1997  .  PERIPHERAL VASCULAR CATHETERIZATION Left 06/16/2016   Procedure: Lower Extremity Venography- Venus Thrombolysis;  Surgeon: Serafina Mitchell, MD;  Location: [place] [facility];  Service: Cardiovascular;  Laterality: Left;    Allergies: Patient has no known allergies.  Medications: Prior to Admission medications   Medication Sig Start Date End Date Taking? Authorizing Provider  amLODipine (NORVASC) 10 MG tablet Take 1 tablet (10 mg total) by mouth daily. 12/10/18   Langston Masker B, PA-C  apixaban (ELIQUIS) 5 MG TABS tablet 10 mg BID x 14 doses then 5 mg BID Patient not taking: Reported on 02/21/2017 06/17/16   Geradine Girt, DO  aspirin EC 325 MG tablet Take 1 tablet (325 mg total) by mouth daily. 06/17/16   Geradine Girt, DO     Family History  Problem Relation Age of Onset  . CAD Mother 77    Social History   Socioeconomic History  . Marital status: Married    Spouse name: Not on file  . Number of children: Not on file  . Years of education: Not on file  . Highest education level: Not on file  Occupational History  . Occupation: Architect  Tobacco Use  . Smoking status: Former Smoker    Types: Cigarettes    Quit date: 11/21/2016    Years since quitting: 2.9  . Smokeless tobacco: Never Used  Substance and Sexual Activity  . Alcohol use: Yes    Alcohol/week: 6.0 standard drinks    Types: 6 Cans of beer per week  . Drug use: No  . Sexual activity: Not on file  Other Topics Concern  . Not on file  Social History Narrative  . Not on file   Social Determinants of Health  Financial Resource Strain:   . Difficulty of Paying Living Expenses:   Food Insecurity:   . Worried About Charity fundraiser in the Last Year:   . Arboriculturist in the Last Year:   Transportation Needs:   . Film/video editor (Medical):   [name] Lack of Transportation (Non-Medical):   Physical Activity:   . Days of Exercise per Week:   . Minutes of Exercise per Session:   Stress:   .  Feeling of Stress :   Social Connections:   . Frequency of Communication with Friends and Family:   . Frequency of Social Gatherings with Friends and Family:   . Attends Religious Services:   . Active Member of Clubs or Organizations:   . Attends Archivist Meetings:   [name] Marital Status:      Review of Systems: A 12 point ROS discussed and pertinent positives are indicated in the HPI above.  All other systems are negative.  Review of Systems  Constitutional: Negative for chills and fever.  Respiratory: Positive for shortness of breath. Negative for wheezing.   Cardiovascular: Positive for chest pain. Negative for palpitations.  Gastrointestinal: Negative for abdominal pain and anal bleeding.  Neurological: Negative for headaches.  Psychiatric/Behavioral: Negative for behavioral problems and confusion.    Vital Signs: BP (!) 145/119 (BP Location: Right Arm)   Pulse (!) 103   Temp 98.2 F (36.8 C) (Oral)   Resp (!) 27   Ht 5\' 9"  (1.753 m)   Wt 206 lb (93.4 kg)   SpO2 93%   BMI 30.42 kg/m   Physical Exam Vitals and nursing note reviewed.  Constitutional:      General: He is not in acute distress.    Appearance: Normal appearance.  Cardiovascular:     Rate and Rhythm: Regular rhythm. Tachycardia present.     Heart sounds: Normal heart sounds. No murmur.  Pulmonary:     Effort: No respiratory distress.     Breath sounds: Normal breath sounds. No wheezing.     Comments: Tachypneic. Skin:    General: Skin is warm and dry.  Neurological:     Mental Status: He is alert and oriented to person, place, and time.      MD Evaluation Airway: WNL Heart: WNL Abdomen: WNL Chest/ Lungs: WNL ASA  Classification: 3 Mallampati/Airway Score: Two   Imaging: CT Angio Chest PE W and/or Wo Contrast  Result Date: 11/03/2019 CLINICAL DATA:  Progressive shortness of breath beginning 4 days after receiving his 2nd COVID vaccine. EXAM: CT ANGIOGRAPHY CHEST WITH CONTRAST  TECHNIQUE: Multidetector CT imaging of the chest was performed using the standard protocol during bolus administration of intravenous contrast. Multiplanar CT image reconstructions and MIPs were obtained to evaluate the vascular anatomy. CONTRAST:  183mL OMNIPAQUE IOHEXOL 350 MG/ML SOLN COMPARISON:  Portable chest obtained earlier today. Chest CTA dated 06/14/2016. FINDINGS: Cardiovascular: Multiple large central and peripheral pulmonary artery filling defects bilaterally. These are significantly restricting blood flow bilaterally, especially on the right. The right ventricular to left ventricular ratio is 1.36. The heart remains mildly enlarged. Mediastinum/Nodes: No enlarged mediastinal, hilar, or axillary lymph nodes. Thyroid gland, trachea, and esophagus demonstrate no significant findings. Lungs/Pleura: Minimal bibasilar linear atelectasis or scarring. No pleural fluid. Upper Abdomen: There is some reflux of contrast into the inferior vena cava and hepatic veins. Musculoskeletal: Minimal thoracic spine degenerative changes. Review of the MIP images confirms the above findings. IMPRESSION: 1. Multiple large central and peripheral pulmonary emboli  bilaterally, significantly restricting blood flow bilaterally, especially on the right. 2. Elevated right ventricular to left ventricular ratio. In view of the multiple large bilateral pulmonary emboli with restriction of blood flow and reflux contrast into the inferior vena cava and hepatic veins, this is compatible with right heart strain associated with at least submassive PE. 3. Stable mild cardiomegaly. Critical Value/emergent results were called by telephone at the time of interpretation on 11/03/2019 at 8:29 pm to provider [facility] , who verbally acknowledged these results. Electronically Signed   By: Claudie Revering M.D.   On: 11/03/2019 20:38   DG Chest Port 1 View  Result Date: 11/03/2019 CLINICAL DATA:  59 year old male with shortness of breath. EXAM:  PORTABLE CHEST 1 VIEW COMPARISON:  Chest radiograph dated 06/14/2016 and CT dated 06/14/2016 FINDINGS: No focal consolidation, pleural effusion, pneumothorax. The cardiac silhouette is within normal limits. No acute osseous pathology. IMPRESSION: No active disease. Electronically Signed   By: Anner Crete M.D.   On: 11/03/2019 17:52    Labs:  CBC: Recent Labs    11/03/19 1722 11/04/19 0400  WBC 11.5* 11.7*  HGB 14.4 14.0  HCT 43.6 41.6  PLT 143* 150    COAGS: No results for input(s): INR, APTT in the last 8760 hours.  BMP: Recent Labs    11/03/19 1722 11/04/19 0400  NA 141 141  K 4.1 4.8  CL 109 112*  CO2 21* 21*  GLUCOSE 121* 98  BUN 20 21*  CALCIUM 8.6* 8.3*  CREATININE 1.85* 1.76*  GFRNONAA 40* 42*  GFRAA 46* 49*     Assessment and Plan:  Bilateral pulmonary emboli (multiuple large central and peripheral, significantly restricting blood flow bilaterally, seen on CTA 11/03/2019). Plan for image-guided pulmonary arteriogram with possible catheter directed thrombolysis today in IR. Case has been reviewed by Dr. Earleen Newport who approves procedure. Patient currently on IV Heparin, tachycardic and tachypneic, sating 96-97% on 1L [place]. Based on history and clinical presentation, patient is classified as intermediate risk of long term complications if pulmonary emboli left untreated. Risks and benefits of procedure discussed with patient. Dr. Earleen Newport also called patient's wife, Mitchell Cooper, and discussed above via telephone. Patient is NPO. Afebrile. Ok to proceed with IV Heparin per IR protocol. INR pending. Patient to be transferred to ICU following procedure, 6N RN aware to facilitate transfer.  Risks and benefits discussed with the patient including, but not limited to bleeding, possible life threatening bleeding and need for blood product transfusion, vascular injury, stroke, contrast induced renal failure, limb loss and infection. All of the patient's questions were answered,  patient is agreeable to proceed. Consent signed and in IR control room.   Thank you for this interesting consult.  I greatly enjoyed meeting LENNELL SHANKS and look forward to participating in their care.  A copy of this report was sent to the requesting provider on this date.  Electronically Signed: Earley Abide, PA-C 11/04/2019, 9:30 AM   I spent a total of 40 Minutes in face to face in clinical consultation, greater than 50% of which was counseling/coordinating care for PE/catheter directed thrombolysis.

## 2019-11-04 NOTE — Consult Note (Signed)
   NAME:  Mitchell Cooper, MRN:  [id number], DOB:  1960-08-14, LOS: 1 ADMISSION DATE:  11/03/2019, CONSULTATION DATE:  11/04/19 REFERRING MD:  EDP, CHIEF COMPLAINT:   Dyspnea  Brief History   59 year old male with a past medical history of DVT/PE  not on anticoagulation, hypertension and tobacco abuse who presented with 4 days of progressive dyspnea and found to have central and peripheral PE's bilaterally with evidence of right heart strain on CTA.  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  Last PE was approximately 3 years ago in 2017 and patient states that he stopped taking Eliquis approximately 9 months ago because he could not afford it.  Received his second Covid-19 vaccination several days before noting progressive shortness of breath.  No fevers, chills, coughing, URI symptoms, chest pain or leg pain and swelling. SOB bnoted on Tuesday. Progressed over the past few days with worsening DOE. No cough. No hemoptysis.    In the emergency department, patient was tachycardic in the 130s, tachypneic and oxygen saturations as low as 91% so he was placed on 3 L nasal cannula.  He has not been hypotensive.  BNP 637, high-sensitivity troponin 91, creatinine 1.8.  CTA was obtained showing multiple large central and peripheral PEs with filling defects bilaterally and concernforrightheartstrain therefore PCCM consulted.  Past Medical History   has a past medical history of DVT (deep venous thrombosis) ([place]), Hypertension (1997), and Tobacco dependence.  Significant Hospital Events   4/17 admit for pulmonary embolism  Consults:  PCCM  Procedures:    Significant Diagnostic Tests:  4/17 CTA chest>>Multiple large central and peripheral pulmonary artery filling defects bilaterally  Micro Data:  4/17  Covid-19>> negative  Antimicrobials:    Interim history/subjective:  Mild dyspnea persists. Clot in transit on echo.  Objective   Blood pressure (!) 145/119, pulse (!) 103, temperature 98.2 F (36.8 C), temperature source Oral, resp. rate (!) 27, height 5\' 9"  (1.753 m), weight 93.4 kg, SpO2 93 %.        Intake/Output Summary (Last 24 hours) at 11/04/2019 1106 Last data filed at 11/04/2019 0647 Gross per 24 hour  Intake 796.36 ml  Output --  Net 796.36 ml   Filed Weights   11/03/19 1708 11/04/19 0438  Weight: 97.5 kg 93.4 kg   GEN: no acute distress HEENT: MMM, trachea midline CV: RRR, pronounced P2, ext warm PULM: Clear, no wheezing GI: Soft, +BS EXT: no edema NEURO: moves all 4 ext to command PSYCH: RASS 0 SKIN: No rashes    Resolved Hospital Problem list   NA  Assessment & Plan:  High risk submassive PE with clot in transit- no contraindication to systemic lytics. - 100mg  IV tPA today over 2 hours per usual protocol - Repeat limited echo tomorrow, if persistent clot can consider retrieval - Needs lifelong AC once stabilized - Risk/benefit of lytics discussed with patient at length  32 minutes critical care time Erskine Emery MD

## 2019-11-04 NOTE — Progress Notes (Signed)
Patient had brief cardiac arrest after tPA.  Bedside echo by Dr. Missy Sabins shows TV clot now gone.  RV stunned but still functional.  BP/HR reassuring.  Discussed case with ECMO team, for now continue to watch, patient has consented for [facility] if needed.  Restart heparin once PTT < 80s.  Reviewed VITT but patient did not get the vaccine associated with this.  LE duplex reviewed with Dr. Earleen Newport, no acute clot to explain current CTA findings, left sided insufficiency is chronic and stable.  Iliacs appear to be open based on waveforms.  If stable over next hour or two, will send down for CTA A/P venous phase to look for degree of clot burden within IVC.  If large degree, Dr. Earleen Newport may try to retrieve tomorrow, if gone, we should be protected without needing to manipulate further.  Erskine Emery MD PCCM

## 2019-11-04 NOTE — ED Notes (Signed)
Pt lying in bed. Awakens easily. Pt denies any needs. [facility] here for transport to [facility].

## 2019-11-04 NOTE — Progress Notes (Signed)
Noted referral for benefits check on Eliquis and Xarelto- submitted for check and will be processed on Monday April 19

## 2019-11-04 NOTE — Progress Notes (Signed)
ANTICOAGULATION CONSULT NOTE - Initial Consult  Pharmacy Consult for heparin Indication: pulmonary embolus  No Known Allergies  Patient Measurements: Height: 5\' 9"  (175.3 cm) Weight: 93.4 kg (206 lb) IBW/kg (Calculated) : 70.7 Heparin Dosing Weight: 91 kg  Vital Signs: Temp: 98.2 F (36.8 C) (04/18 0438) Temp Source: Oral (04/18 0438) BP: 145/119 (04/18 0438) Pulse Rate: 103 (04/18 0807)  Labs: Recent Labs    11/03/19 1722 11/04/19 0400 11/04/19 0635  HGB 14.4 14.0  --   HCT 43.6 41.6  --   PLT 143* 150  --   HEPARINUNFRC  --   --  0.30  CREATININE 1.85* 1.76*  --   TROPONINIHS 91*  --   --     Estimated Creatinine Clearance: 52.3 mL/min (A) (by C-G formula based on SCr of 1.76 mg/dL (H)).   Medical History: Past Medical History:  Diagnosis Date  . DVT (deep venous thrombosis) ([place])   . Hypertension 1997   has never taken medications  . Tobacco dependence     Medications: No anticoagulants PTA  Assessment: 75 yoM presenting to ED with SOB. CTA revealed bilateral PE. Pt has PMH significant for PE and reported previously taking apixaban, but reported that he is no longer taking anticoagulants PTA due to cost. 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  Heparin antibody is pending  -heparin level= 0.3 -platelet count= 150  Goal of Therapy:  Heparin level 0.3-0.7 units/ml Monitor platelets by anticoagulation protocol: Yes   Plan:  -Increase heparin to 1600 units/hr to keep in goal -Daily PT/INR -Follow heparin antibody results  Hildred Laser, PharmD Clinical Pharmacist **Pharmacist phone directory can now be found on [email or web address] ([email or web address]).  Listed under [place].

## 2019-11-04 NOTE — Progress Notes (Signed)
[facility] Progress Note Patient Name: ZAYDON KINSER DOB: September 28, 1960 MRN: [id number]   Date of Service  11/04/2019  HPI/Events of Note  High DBP 161/102, 151/106, 152/108. Notes so not to treat hypertension aggressively possible d/t PEA arrest this afternoon. Got norvasc around 1930. Do we want to treat or monitor?? Parameters?? Nurse was told they did not want to give beta blockers  Data reviewed.  eICU Interventions  - Low dose Hydralazine for DBP > 100 for now.      Intervention Category Intermediate Interventions: Hypertension - evaluation and management  Elmer Sow 11/04/2019, 11:06 PM

## 2019-11-04 NOTE — ED Notes (Signed)
Pt lying in bed watching t.v. pt denies any needs. Full monitor on. Will continue to monitor pt.

## 2019-11-04 NOTE — Progress Notes (Addendum)
Bed approved for transfer to [place]. Report called to [facility], Therapist, sports.

## 2019-11-04 NOTE — Progress Notes (Signed)
ANTICOAGULATION CONSULT NOTE  Pharmacy Consult for tPA>>heparin Indication: pulmonary embolus  No Known Allergies  Patient Measurements: Height: 5\' 9"  (175.3 cm) Weight: 93.4 kg (206 lb) IBW/kg (Calculated) : 70.7 Heparin Dosing Weight: 91 kg  Vital Signs: Temp: 97.9 F (36.6 C) (04/18 1200) Temp Source: Oral (04/18 1200) BP: 128/101 (04/18 1500) Pulse Rate: 102 (04/18 1500)  Labs: Recent Labs    11/03/19 1722 11/03/19 1722 11/04/19 0400 11/04/19 0635 11/04/19 1414 11/04/19 1426  HGB 14.4   < > 14.0  --   --  14.3  HCT 43.6  --  41.6  --   --  43.3  PLT 143*  --  150  --   --  102*  APTT  --   --   --   --  63*  --   LABPROT  --   --   --   --  22.5*  --   INR  --   --   --   --  2.0*  --   HEPARINUNFRC  --   --   --  0.30  --   --   CREATININE 1.85*  --  1.76*  --   --   --   TROPONINIHS 91*  --   --   --   --   --    < > = values in this interval not displayed.    Estimated Creatinine Clearance: 52.3 mL/min (A) (by C-G formula based on SCr of 1.76 mg/dL (H)).   Medical History: Past Medical History:  Diagnosis Date  . DVT (deep venous thrombosis) ([place])   . Hypertension 1997   has never taken medications  . Tobacco dependence      Assessment: 59 yoM admitted with SOB found to have bilateral PE and atrial thrombus. Pt has hx VTE but has not taken any anticoagulants in months due to cost. Pt initially started on IV heparin, now receiving IV tPA. Pharmacy asked to resume heparin afterwards. Will check coags 30 minutes after lysis complete and target lower heparin level goal x24h.  Aptt resulted 63s so will initiate heparin infusion now. Was therapeutic previously on 1550 units/hr, will restart at slightly lower rate post tpa then increase as indicated.   Goal of Therapy:  Heparin level 0.3-0.7 units/ml (0.3-0.5 x24h) Monitor platelets by anticoagulation protocol: Yes   Plan:  Restart heparin at 1500 units/hr Heparin level tonight  Erin Hearing PharmD.,  BCPS Clinical Pharmacist 11/04/2019 3:31 PM

## 2019-11-04 NOTE — Progress Notes (Signed)
Full note to follow. High risk submassive PE with clot in RA/IVC. Warrants systemic lytics. Discussed risks and benefits with patient and he is agreeable. Turn heparin off, can resume after 12-24h depending on institutional protocol.  Erskine Emery MD PCCM

## 2019-11-04 NOTE — ED Notes (Signed)
Pt lying in bed watching t.v. NAD noted. Will continue to monitor pt.

## 2019-11-04 NOTE — Code Documentation (Signed)
  Patient Name: Mitchell Cooper   MRN: [id number]   Date of Birth/ Sex: 06/08/1961 , male      Admission Date: 11/03/2019  Attending Provider: [place] Eric J, DO  Primary Diagnosis: Acute pulmonary embolism [facility])   Indication: Pt was in his usual state of health until this PM, when he was noted to be in PEA. Code blue was subsequently called. At the time of arrival on scene, ACLS protocol was underway.  Upon arrival at room, patient had ROSC. Shortly after arrival, primary team was at room and assumed care.   Technical Description:  - CPR performance duration:  2 minutes  - Was defibrillation or cardioversion used? No   - Was external pacer placed? No  - Was patient intubated pre/post CPR? No   Medications Administered: Y = Yes; Blank = No Amiodarone    Atropine    Calcium    Epinephrine    Lidocaine    Magnesium    Norepinephrine    Phenylephrine    Sodium bicarbonate    Vasopressin     Post CPR evaluation:  - Final Status - Was patient successfully resuscitated ? Yes - What is current rhythm? Sinus tachycardia - What is current hemodynamic status? Stable  Miscellaneous Information:  - Labs sent, including: BMP, INR, PTT  - Primary team notified?  Yes  - Family Notified? Yes  - Additional notes/ transfer status:      Jeanmarie Hubert, MD  11/04/2019, 1:32 PM

## 2019-11-04 NOTE — Progress Notes (Addendum)
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  With PE in 2017, and has since been off of anticoagulation.  Also patient received the second dose of his [place] Covid-19 vaccination on Saturday; and started also experiencing myalgias the following day.  In the ED, platelets of 143, CT angiogram chest notable for large bilateral PEs with right heart strain.  EKG with tachycardia and findings of our at bedtime.  Troponin 91, BNP 637.  Patient was started on heparin drip.  PCCM was consulted to eval for EKOS treatment given his significant clot burden with right heart strain.  TRH was consulted for admission.   Assessment & Plan:   Principal Problem:   Acute pulmonary embolism (HCC) Active Problems:   Uncontrolled hypertension   CKD (chronic kidney disease) stage 3, GFR 30-59 ml/min   Acute submassive pulmonary embolism with right heart strain Patient presents with progressive dyspnea.  CT angiogram notable for submassive PE bilaterally with right heart strain.   --PCCM following, appreciate assistance --IR consulted, plan pulmonary arteriogram with possible catheter directed thrombolysis today --Echocardiogram: Pending --Ultrasound vascular duplex lower extremities: Pending --Continue heparin drip, pharmacy for dosing/monitoring --n.p.o., continue to monitor on telemetry  Addendum 1030:  Received call from cardiology regarding echocardiogram. Dr. Gardiner Rhyme noted patient with large right atrial clot, likely not amenable to IR catheter directed thrombolysis.  Messaged IR, regarding findings.  Reconsulted PCCM for  further evaluation and other treatment options such as eligibility for peripheral thrombolysis given right heart strain and clot burden, although currently hemodynamically stable.  Essential hypertension BP 145/119.  Currently not on any home antihypertensives.  Currently n.p.o. for planned procedure. --Labetalol 5mg  IV prn  CKD stage IIIa Baseline Cr 1.1-1.4.  Creatinine on admission 1.76. --Avoid nephrotoxins, renally dose all medications --Continue to monitor renal function daily  Tobacco use disorder Counseled on need for cessation   DVT prophylaxis: Heparin drip Code Status: Full code Family Communication: Discussed with patient extensively at bedside  Disposition Plan:  Status is: Inpatient  Remains inpatient appropriate because:Ongoing diagnostic testing needed not appropriate for outpatient work up, Unsafe d/c plan, IV treatments appropriate due to intensity of illness or inability to take PO and Inpatient level of care appropriate due to severity of illness   Dispo: The patient is from: Home              Anticipated d/c is to: Home              Anticipated d/c date is: > 3 days              Patient currently is not medically stable to d/c.  Consultants:   PCCM  Interventional radiology  Procedures:   Pulmonary arteriogram with possible catheter directed thrombolysis: Pending  Antimicrobials:   none   Subjective: Patient examined at bedside, lying comfortably in bed.  Continues with some mild dyspnea and right-sided lower lateral rib pain.  No other specific complaints this morning.  Pulmonology following, IR plans pulmonary arteriogram with possible catheter directed thrombolysis today.  Awaiting echo and vascular duplex ultrasound lower extremities.  Discussed extensively regarding treatment plan with the  patient at bedside.  Denies headache, no fever/chills/night sweats, no nauseous abdomen/diarrhea, no palpitations, no abdominal pain.  No acute events  overnight per nursing staff.  Objective: Vitals:   11/04/19 0345 11/04/19 0400 11/04/19 0438 11/04/19 0807  BP: (!) 147/117 (!) 146/110 (!) 145/119   Pulse: 100 (!) 105 (!) 107 (!) 103  Resp:  20 20 (!) 27  Temp:   98.2 F (36.8 C)   TempSrc:   Oral   SpO2: 97% (!) 89% 93% 93%  Weight:   93.4 kg   Height:        Intake/Output Summary (Last 24 hours) at 11/04/2019 0942 Last data filed at 11/04/2019 0647 Gross per 24 hour  Intake 796.36 ml  Output --  Net 796.36 ml   Filed Weights   11/03/19 1708 11/04/19 0438  Weight: 97.5 kg 93.4 kg    Examination:  General exam: Appears calm and comfortable  Respiratory system: Clear to auscultation. Respiratory effort normal.  On 3 L nasal cannula Cardiovascular system: S1 & S2 heard, RRR. No JVD, murmurs, rubs, gallops or clicks. No pedal edema. Gastrointestinal system: Abdomen is nondistended, soft and nontender. No organomegaly or masses felt. Normal bowel sounds heard. Central nervous system: Alert and oriented. No focal neurological deficits. Extremities: Symmetric 5 x 5 power. Skin: No rashes, lesions or ulcers Psychiatry: Judgement and insight appear normal. Mood & affect appropriate.     Data Reviewed: I have personally reviewed following labs and imaging studies  CBC: Recent Labs  Lab 11/03/19 1722 11/04/19 0400  WBC 11.5* 11.7*  HGB 14.4 14.0  HCT 43.6 41.6  MCV 81.3 81.4  PLT 143* 852   Basic Metabolic Panel: Recent Labs  Lab 11/03/19 1722 11/04/19 0400  NA 141 141  K 4.1 4.8  CL 109 112*  CO2 21* 21*  GLUCOSE 121* 98  BUN 20 21*  CREATININE 1.85* 1.76*  CALCIUM 8.6* 8.3*   GFR: Estimated Creatinine Clearance: 52.3 mL/min (A) (by C-G formula based on SCr of 1.76 mg/dL (H)). Liver Function Tests: No results for input(s): AST, ALT, ALKPHOS, BILITOT, PROT, ALBUMIN in the last 168 hours. No results for input(s): LIPASE, AMYLASE in the last 168 hours. No results for input(s): AMMONIA in the last 168  hours. Coagulation Profile: No results for input(s): INR, PROTIME in the last 168 hours. Cardiac Enzymes: No results for input(s): CKTOTAL, CKMB, CKMBINDEX, TROPONINI in the last 168 hours. BNP (last 3 results) No results for input(s): PROBNP in the last 8760 hours. HbA1C: No results for input(s): HGBA1C in the last 72 hours. CBG: No results for input(s): GLUCAP in the last 168 hours. Lipid Profile: No results for input(s): CHOL, HDL, LDLCALC, TRIG, CHOLHDL, LDLDIRECT in the last 72 hours. Thyroid Function Tests: No results for input(s): TSH, T4TOTAL, FREET4, T3FREE, THYROIDAB in the last 72 hours. Anemia Panel: No results for input(s): VITAMINB12, FOLATE, FERRITIN, TIBC, IRON, RETICCTPCT in the last 72 hours. Sepsis Labs: No results for input(s): PROCALCITON, LATICACIDVEN in the last 168 hours.  Recent Results (from the past 240 hour(s))  Respiratory Panel by RT PCR (Flu A&B, Covid) - Nasopharyngeal Swab     Status: None   Collection Time: 11/03/19 10:48 PM   Specimen: Nasopharyngeal Swab  Result Value Ref Range Status   SARS Coronavirus 2 by RT PCR NEGATIVE NEGATIVE Final    Comment: (NOTE) SARS-CoV-2 target nucleic acids are NOT DETECTED. The SARS-CoV-2 RNA is generally detectable in upper respiratoy specimens during the acute phase of infection. The  lowest concentration of SARS-CoV-2 viral copies this assay can detect is 131 copies/mL. A negative result does not preclude SARS-Cov-2 infection and should not be used as the sole basis for treatment or other patient management decisions. A negative result may occur with  improper specimen collection/handling, submission of specimen other than nasopharyngeal swab, presence of viral mutation(s) within the areas targeted by this assay, and inadequate number of viral copies (<131 copies/mL). A negative result must be combined with clinical observations, patient history, and epidemiological information. The expected result is  Negative. Fact Sheet for Patients:  PinkCheek.be Fact Sheet for Healthcare Providers:  GravelBags.it This test is not yet ap proved or cleared by the [place] FDA and  has been authorized for detection and/or diagnosis of SARS-CoV-2 by FDA under an Emergency Use Authorization (EUA). This EUA will remain  in effect (meaning this test can be used) for the duration of the COVID-19 declaration under Section 564(b)(1) of the Act, 21 U.S.C. section 360bbb-3(b)(1), unless the authorization is terminated or revoked sooner.    Influenza A by PCR NEGATIVE NEGATIVE Final   Influenza B by PCR NEGATIVE NEGATIVE Final    Comment: (NOTE) The Xpert Xpress SARS-CoV-2/FLU/RSV assay is intended as an aid in  the diagnosis of influenza from Nasopharyngeal swab specimens and  should not be used as a sole basis for treatment. Nasal washings and  aspirates are unacceptable for Xpert Xpress SARS-CoV-2/FLU/RSV  testing. Fact Sheet for Patients: PinkCheek.be Fact Sheet for Healthcare Providers: GravelBags.it This test is not yet approved or cleared by the [place] FDA and  has been authorized for detection and/or diagnosis of SARS-CoV-2 by  FDA under an Emergency Use Authorization (EUA). This EUA will remain  in effect (meaning this test can be used) for the duration of the  Covid-19 declaration under Section 564(b)(1) of the Act, 21  U.S.C. section 360bbb-3(b)(1), unless the authorization is  terminated or revoked. Performed at [facility], [place] [address] [place], [place] [address]          Radiology Studies: CT Angio Chest PE W and/or Wo Contrast  Result Date: 11/03/2019 CLINICAL DATA:  Progressive shortness of breath beginning 4 days after receiving his 2nd COVID vaccine. EXAM: CT ANGIOGRAPHY CHEST WITH CONTRAST TECHNIQUE: Multidetector CT imaging of  the chest was performed using the standard protocol during bolus administration of intravenous contrast. Multiplanar CT image reconstructions and MIPs were obtained to evaluate the vascular anatomy. CONTRAST:  175mL OMNIPAQUE IOHEXOL 350 MG/ML SOLN COMPARISON:  Portable chest obtained earlier today. Chest CTA dated 06/14/2016. FINDINGS: Cardiovascular: Multiple large central and peripheral pulmonary artery filling defects bilaterally. These are significantly restricting blood flow bilaterally, especially on the right. The right ventricular to left ventricular ratio is 1.36. The heart remains mildly enlarged. Mediastinum/Nodes: No enlarged mediastinal, hilar, or axillary lymph nodes. Thyroid gland, trachea, and esophagus demonstrate no significant findings. Lungs/Pleura: Minimal bibasilar linear atelectasis or scarring. No pleural fluid. Upper Abdomen: There is some reflux of contrast into the inferior vena cava and hepatic veins. Musculoskeletal: Minimal thoracic spine degenerative changes. Review of the MIP images confirms the above findings. IMPRESSION: 1. Multiple large central and peripheral pulmonary emboli bilaterally, significantly restricting blood flow bilaterally, especially on the right. 2. Elevated right ventricular to left ventricular ratio. In view of the multiple large bilateral pulmonary emboli with restriction of blood flow and reflux contrast into the inferior vena cava and hepatic veins, this is compatible with right heart strain associated with at least submassive  PE. 3. Stable mild cardiomegaly. Critical Value/emergent results were called by telephone at the time of interpretation on 11/03/2019 at 8:29 pm to provider [facility] , who verbally acknowledged these results. Electronically Signed   By: Claudie Revering M.D.   On: 11/03/2019 20:38   DG Chest Port 1 View  Result Date: 11/03/2019 CLINICAL DATA:  59 year old male with shortness of breath. EXAM: PORTABLE CHEST 1 VIEW COMPARISON:  Chest  radiograph dated 06/14/2016 and CT dated 06/14/2016 FINDINGS: No focal consolidation, pleural effusion, pneumothorax. The cardiac silhouette is within normal limits. No acute osseous pathology. IMPRESSION: No active disease. Electronically Signed   By: Anner Crete M.D.   On: 11/03/2019 17:52        Scheduled Meds: Continuous Infusions: . sodium chloride 75 mL/hr at 11/03/19 2220  . heparin 1,600 Units/hr (11/04/19 0912)     LOS: 1 day    Time spent: 38 minutes spent on chart review, discussion with nursing staff, consultants, updating family and interview/physical exam; more than 50% of that time was spent in counseling and/or coordination of care.    Mitchell Gerhart J [place] DO [facility] Available via Epic secure chat 7am-7pm After these hours, please refer to coverage provider listed on [email or web address] 11/04/2019, 9:42 AM

## 2019-11-04 NOTE — Significant Event (Signed)
Rapid Response Event Note  Overview: ICU Transfer   Initial Focused Assessment: Called by nursing staff to help transfer patient to the ICU for TPA IV. I went to see the patient on 6E, patient was admitted for submassive PE. Upon arrival, patient was alert and oriented, very pleasant. Not in acute distress, he endorsed that his breathing is much better now and he has very mild pain on the LT side of chest from time to time. 98% on 2L, HR 100-110, RR normal - good respiratory effort. Skin warm and dry.   Interventions:  -- Transfer to 2H22 for IV TPA  Plan of Care: -- Transfer to 7T75 for IV TPA  Event Summary:  Call Time 1112 End Time [place], [place]

## 2019-11-05 ENCOUNTER — Inpatient Hospital Stay (HOSPITAL_COMMUNITY): Payer: Medicaid Other

## 2019-11-05 DIAGNOSIS — I2609 Other pulmonary embolism with acute cor pulmonale: Principal | ICD-10-CM

## 2019-11-05 DIAGNOSIS — I2602 Saddle embolus of pulmonary artery with acute cor pulmonale: Secondary | ICD-10-CM

## 2019-11-05 LAB — HEPARIN INDUCED PLATELET AB (HIT ANTIBODY): Heparin Induced Plt Ab: 0.09 OD (ref 0.000–0.400)

## 2019-11-05 LAB — CBC
HCT: 40.9 % (ref 39.0–52.0)
Hemoglobin: 13.9 g/dL (ref 13.0–17.0)
MCH: 27.5 pg (ref 26.0–34.0)
MCHC: 34 g/dL (ref 30.0–36.0)
MCV: 80.8 fL (ref 80.0–100.0)
Platelets: 127 10*3/uL — ABNORMAL LOW (ref 150–400)
RBC: 5.06 MIL/uL (ref 4.22–5.81)
RDW: 14.7 % (ref 11.5–15.5)
WBC: 9.5 10*3/uL (ref 4.0–10.5)
nRBC: 0 % (ref 0.0–0.2)

## 2019-11-05 LAB — ECHOCARDIOGRAM LIMITED
Height: 69 in
Weight: 3296 oz

## 2019-11-05 LAB — BASIC METABOLIC PANEL
Anion gap: 11 (ref 5–15)
BUN: 17 mg/dL (ref 6–20)
CO2: 20 mmol/L — ABNORMAL LOW (ref 22–32)
Calcium: 8.4 mg/dL — ABNORMAL LOW (ref 8.9–10.3)
Chloride: 108 mmol/L (ref 98–111)
Creatinine, Ser: 1.34 mg/dL — ABNORMAL HIGH (ref 0.61–1.24)
GFR calc Af Amer: >60 mL/min (ref >60)
GFR calc non Af Amer: 58 mL/min — ABNORMAL LOW (ref >60)
Glucose, Bld: 103 mg/dL — ABNORMAL HIGH (ref 70–99)
Potassium: 4 mmol/L (ref 3.5–5.1)
Sodium: 139 mmol/L (ref 135–145)

## 2019-11-05 LAB — HEPARIN LEVEL (UNFRACTIONATED)
Heparin Unfractionated: 0.14 IU/mL — ABNORMAL LOW (ref 0.30–0.70)
Heparin Unfractionated: 0.28 IU/mL — ABNORMAL LOW (ref 0.30–0.70)

## 2019-11-05 LAB — PROTIME-INR
INR: 1.3 — ABNORMAL HIGH (ref 0.8–1.2)
Prothrombin Time: 16.3 seconds — ABNORMAL HIGH (ref 11.4–15.2)

## 2019-11-05 LAB — MRSA PCR SCREENING: MRSA by PCR: NEGATIVE

## 2019-11-05 MED ORDER — IOHEXOL 350 MG/ML SOLN
100.0000 mL | Freq: Once | INTRAVENOUS | Status: AC | PRN
  Administered 2019-11-05: 100 mL via INTRAVENOUS

## 2019-11-05 MED FILL — Medication: Qty: 1 | Status: AC

## 2019-11-05 NOTE — TOC Initial Note (Signed)
Transition of Care [facility]) - Initial/Assessment Note    Patient Details  Name: Mitchell Cooper MRN: [id number] Date of Birth: [birth year]  Transition of Care [facility]) CM/SW Contact:    Sharin Mons, RN Phone Number: [phone] 11/05/2019, 3:42 PM  Clinical Narrative:   Admit for pulmonary embolism, hx of  DVT/PE not on anticoagulation, hypertension and tobacco abuse.           - 4/18 PEA arrest  From home with wife, Levada Dy.  Jerrol Banana (Spouse)     [phone]       PTA independent with ADL's. Pt with job now, hasn't been working since pandemic. Pt without health insurance. States health insurance will effective in June. Pt will probably need assistance with Rx medications @ d/c.   Pt without PCP. NCM provided and shared information about [facility]. Pt interested. Hospital f/u scheduled and noted on AVS.  Medication assistance can be provided by [facility] pharmacy.  TOC monitoring and following for needs .[name]...  Expected Discharge Plan: Home/Self Care Barriers to Discharge: Continued Medical Work up   Patient Goals and CMS Choice Patient states their goals for this hospitalization and ongoing recovery are:: to get better      Expected Discharge Plan and Services Expected Discharge Plan: Home/Self Care   Prior Living Arrangements/Services      Activities of Daily Living Home Assistive Devices/Equipment: None ADL Screening (condition at time of admission) Patient's cognitive ability adequate to safely complete daily activities?: Yes Is the patient deaf or have difficulty hearing?: No Does the patient have difficulty seeing, even when wearing glasses/contacts?: No Does the patient have difficulty concentrating, remembering, or making decisions?: No Patient able to express need for assistance with ADLs?: Yes Does the patient have difficulty dressing or bathing?: No Independently performs ADLs?: Yes (appropriate for developmental age) Does the patient have difficulty  walking or climbing stairs?: No Weakness of Legs: None Weakness of Arms/Hands: None  Permission Sought/Granted                  Emotional Assessment              Admission diagnosis:  Acute pulmonary embolism ([place]) [I26.99] Acute pulmonary embolism with acute cor pulmonale, unspecified pulmonary embolism type ([place]) [I26.09] Patient Active Problem List   Diagnosis Date Noted  . Acute pulmonary embolism ([place]) 11/03/2019  . 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 Pulmonary embolism ([place]) 06/14/2016  . Uncontrolled hypertension 06/14/2016  . CKD (chronic kidney disease) stage 3, GFR 30-59 ml/min 06/14/2016  . Hyperglycemia 06/14/2016  . Tobacco dependence 06/14/2016   PCP:  Patient, No Pcp Per Pharmacy:   [id number] - [place], [place] [place] [address] Phone: [phone] Fax: [phone]     Social Determinants of Health (SDOH) Interventions    Readmission Risk Interventions No flowsheet data found.

## 2019-11-05 NOTE — Progress Notes (Addendum)
Advanced Heart Failure Rounding Note  PCP-Cardiologist: No primary care provider on file.    Patient Profile   Mr. 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 CTA was obtained showing multiple large central and peripheral PEs with filling defects bilaterally and RV strain.   Echo showed LVEF 65% with compressed LV. RV markedly dilated with severe RV dysfunction and large mobile clot extending from IVC into RA bobbing across TV. RVSP ~11mmHG  Patient received T-PA about 2.5 hours into 3 hour T-PA infusion patient developed PEA arrest and was resuscitated in 30 seconds.    LE venous dopplers showed no DVT in RLE. There is recanalized thrombus in the left popliteal vein(s). 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 Unchanged from previous examination.   Subjective:    Feels much better today. Sitting up in bed. Has mild pleuritic CP but no dyspnea.    Objective:   Weight Range: 93.4 kg Body mass index is 30.42 kg/m.   Vital Signs:   Temp:  [97.7 F (36.5 C)-98.3 F (36.8 C)] 97.7 F (36.5 C) (04/19 0750) Pulse Rate:  [78-117] 90 (04/19 0700) Resp:  [16-42] 20 (04/19 0700) BP: (116-172)/(83-151) 154/105 (04/19 0700) SpO2:  [87 %-100 %] 96 % (04/19 0700) Last BM Date: 11/01/19  Weight change: Filed Weights   11/03/19 1708 11/04/19 0438  Weight: 97.5 kg 93.4 kg    Intake/Output:   Intake/Output Summary (Last 24 hours) at 11/05/2019 0759 Last data filed at 11/05/2019 0700 Gross per 24 hour  Intake 1157.36 ml  Output 1001 ml  Net 156.36 ml      Physical Exam    General:  Well appearing male sitting up in bed. No resp difficulty HEENT: Normal Neck: Supple. JVP . Carotids 2+ bilat; no bruits. No lymphadenopathy or thyromegaly  appreciated. Cor: PMI nondisplaced. Regular rate & rhythm. No rubs, gallops or murmurs. Lungs: Clear Abdomen: Soft, nontender, nondistended. No hepatosplenomegaly. No bruits or masses. Good bowel sounds. Extremities: No cyanosis, clubbing, rash, edema Neuro: Alert & orientedx3, cranial nerves grossly intact. moves all 4 extremities w/o difficulty. Affect pleasant   Telemetry   NSR 90s   EKG    No EKG to review   Labs    CBC Recent Labs    11/04/19 1426 11/05/19 0629  WBC 13.8* 9.5  NEUTROABS 10.1*  --   HGB 14.3 13.9  HCT 43.3 40.9  MCV 83.4 80.8  PLT 102* 732*   Basic Metabolic Panel Recent Labs    11/04/19 1426 11/05/19 0629  NA 137 139  K 4.2 4.0  CL 107 108  CO2 18* 20*  GLUCOSE 136* 103*  BUN 23* 17  CREATININE 1.93* 1.34*  CALCIUM 7.9* 8.4*  MG 2.1  --    Liver Function Tests Recent Labs    11/04/19 1426  AST 46*  ALT 31  ALKPHOS 61  BILITOT 1.4*  PROT 6.3*  ALBUMIN 2.8*   No results for input(s): LIPASE, AMYLASE in the last 72 hours. Cardiac Enzymes No results for input(s): CKTOTAL, CKMB, CKMBINDEX, TROPONINI in the last 72 hours.  BNP: BNP (last 3 results) Recent Labs    11/03/19 1722  BNP 637.0*    ProBNP (last 3 results) No results for input(s): PROBNP in the last 8760 hours.   D-Dimer No results for  input(s): DDIMER in the last 72 hours. Hemoglobin A1C No results for input(s): HGBA1C in the last 72 hours. Fasting Lipid Panel No results for input(s): CHOL, HDL, LDLCALC, TRIG, CHOLHDL, LDLDIRECT in the last 72 hours. Thyroid Function Tests No results for input(s): TSH, T4TOTAL, T3FREE, THYROIDAB in the last 72 hours.  Invalid input(s): FREET3  Other results:   Imaging    ECHOCARDIOGRAM COMPLETE  Result Date: 11/04/2019    ECHOCARDIOGRAM REPORT   Patient Name:   Mitchell Cooper Date of Exam: 11/04/2019 Medical Rec #:  [id number]         Height:       69.0 in Accession #:    [id number]        Weight:       206.0 lb Date  of Birth:  02-Mar-1961        BSA:          2.092 m Patient Age:    35 years          BP:           145/119 mmHg Patient Gender: M                 HR:           103 bpm. Exam Location:  Inpatient Procedure: 2D Echo, Cardiac Doppler and Color Doppler          REPORT CONTAINS CRITICAL RESULT  Critical result called to Dr. [place] at 10:18AM. Indications:    Pulmonary embolus  History:        Patient has prior history of Echocardiogram examinations. Risk                 Factors:Hypertension and Former Smoker. CKD. H/o DVT and                 pulmonary embolus.  Sonographer:    Clayton Lefort RDCS (AE) Referring Phys: [place]  1. Large right atrial mass that prolapses across tricuspid valve and extends into IVC, measures 5.4cm x 1.9 cm. Consistent with thrombus in transit.  2. RV free wall akinesis with improved function at apex, consistent with McConnell's sign as can be seen in acute PE. Right ventricular systolic function is severely reduced. The right ventricular size is moderately enlarged. There is moderately elevated pulmonary artery systolic pressure. The estimated right ventricular systolic pressure is 14.4 mmHg.  3. Left ventricular ejection fraction, by estimation, is 60 to 65%. The left ventricle has normal function. The left ventricle has no regional wall motion abnormalities. There is mild concentric left ventricular hypertrophy. Left ventricular diastolic parameters are indeterminate. There is the interventricular septum is flattened in systole and diastole, consistent with right ventricular pressure and volume overload.  4. Right atrial size was moderately dilated.  5. The mitral valve is normal in structure. No evidence of mitral valve regurgitation.  6. The aortic valve is tricuspid. Aortic valve regurgitation is not visualized. No aortic stenosis is present.  7. The inferior vena cava is dilated in size with <50% respiratory variability, suggesting right atrial pressure of 15  mmHg. FINDINGS  Left Ventricle: Left ventricular ejection fraction, by estimation, is 60 to 65%. The left ventricle has normal function. The left ventricle has no regional wall motion abnormalities. The left ventricular internal cavity size was normal in size. There is  mild concentric left ventricular hypertrophy. The interventricular septum is flattened in systole and diastole, consistent with right ventricular pressure and volume overload. Left  ventricular diastolic parameters are indeterminate. Right Ventricle: RV free wall akinesis with improved function at apex, consistent with McConnell's sign as can be seen in acute PE. The right ventricular size is moderately enlarged. Right vetricular wall thickness was not assessed. Right ventricular systolic function is severely reduced. There is moderately elevated pulmonary artery systolic pressure. The tricuspid regurgitant velocity is 3.52 m/s, and with an assumed right atrial pressure of 15 mmHg, the estimated right ventricular systolic pressure is 93.5 mmHg. Left Atrium: Left atrial size was normal in size. 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 Thrombus extends into IVC. Consistent with thrombus in transit. Right atrial size was moderately dilated. Pericardium: There is no evidence of pericardial effusion. Mitral Valve: The mitral valve is normal in structure. No evidence of mitral valve regurgitation. Tricuspid Valve: The tricuspid valve is normal in structure. Tricuspid valve regurgitation is mild. Aortic Valve: The aortic valve is tricuspid. Aortic valve regurgitation is not visualized. No aortic stenosis is present. Aortic valve mean gradient measures 3.0 mmHg. Aortic valve peak gradient measures 6.5 mmHg. Aortic valve area, by VTI measures 2.27 cm. Pulmonic Valve: The pulmonic valve was not well visualized. Pulmonic valve regurgitation is not visualized. Aorta: The aortic root and ascending aorta  are structurally normal, with no evidence of dilitation. Venous: The inferior vena cava is dilated in size with less than 50% respiratory variability, suggesting right atrial pressure of 15 mmHg. IAS/Shunts: The interatrial septum was not well visualized.  LEFT VENTRICLE PLAX 2D LVIDd:         4.00 cm  Diastology LVIDs:         2.30 cm  LV e' lateral: 7.40 cm/s LV PW:         1.30 cm  LV e' medial:  7.18 cm/s LV IVS:        1.30 cm LVOT diam:     2.20 cm LV SV:         42 LV SV Index:   20 LVOT Area:     3.80 cm  RIGHT VENTRICLE             IVC RV Basal diam:  4.40 cm     IVC diam: 2.30 cm RV Mid diam:    4.90 cm RV S prime:     10.70 cm/s TAPSE (M-mode): 1.7 cm LEFT ATRIUM             Index       RIGHT ATRIUM           Index LA diam:        2.40 cm 1.15 cm/m  RA Area:     24.40 cm LA Vol (A2C):   54.8 ml 26.19 ml/m RA Volume:   89.00 ml  42.54 ml/m LA Vol (A4C):   33.4 ml 15.96 ml/m LA Biplane Vol: 45.3 ml 21.65 ml/m  AORTIC VALVE AV Area (Vmax):    2.21 cm AV Area (Vmean):   2.02 cm AV Area (VTI):     2.27 cm AV Vmax:           127.00 cm/s AV Vmean:          87.200 cm/s AV VTI:            0.186 m AV Peak Grad:      6.5 mmHg AV Mean Grad:      3.0 mmHg LVOT Vmax:         73.80 cm/s  LVOT Vmean:        46.300 cm/s LVOT VTI:          0.111 m LVOT/AV VTI ratio: 0.60  AORTA Ao Root diam: 3.40 cm Ao Asc diam:  3.40 cm TRICUSPID VALVE TR Peak grad:   49.6 mmHg TR Vmax:        352.00 cm/s  SHUNTS Systemic VTI:  0.11 m Systemic Diam: 2.20 cm Oswaldo Milian MD Electronically signed by Oswaldo Milian MD Signature Date/Time: 11/04/2019/10:40:44 AM    Final    VAS [place] LOWER EXTREMITY VENOUS (DVT)  Result Date: 11/04/2019  Lower Venous DVTStudy Indications: Pulmonary embolism, and submassive PE with clot in transit noted in right heart.  Risk Factors: May Thurner, stent to left common iliac. Patient stopped taking Eliquis 9 months ago, secondary to cost. Comparison Study: Prior study from 02/21/17 is  available for comparison Performing Technologist: Sharion Dove RVS  Examination Guidelines: A complete evaluation includes B-mode imaging, spectral Doppler, color Doppler, and power Doppler as needed of all accessible portions of each vessel. Bilateral testing is considered an integral part of a complete examination. Limited examinations for reoccurring indications may be performed as noted. The reflux portion of the exam is performed with the patient in reverse Trendelenburg.  +---------+---------------+---------+-----------+----------+-------------------+ RIGHT    CompressibilityPhasicitySpontaneityPropertiesThrombus Aging      +---------+---------------+---------+-----------+----------+-------------------+ CFV      Full           Yes      Yes                  sluggish flow       +---------+---------------+---------+-----------+----------+-------------------+ SFJ      Full                                                             +---------+---------------+---------+-----------+----------+-------------------+ FV Prox  Full                                                             +---------+---------------+---------+-----------+----------+-------------------+ FV Mid   Full                                                             +---------+---------------+---------+-----------+----------+-------------------+ FV DistalFull                                                             +---------+---------------+---------+-----------+----------+-------------------+ PFV      Full                                                             +---------+---------------+---------+-----------+----------+-------------------+  POP      Full           Yes      Yes                                      +---------+---------------+---------+-----------+----------+-------------------+ PTV      Full                                                              +---------+---------------+---------+-----------+----------+-------------------+ PERO     Full                                                             +---------+---------------+---------+-----------+----------+-------------------+ CIV                                                   patent by color and                                                       Doppler             +---------+---------------+---------+-----------+----------+-------------------+   +---------+---------------+---------+-----------+----------+-------------------+ LEFT     CompressibilityPhasicitySpontaneityPropertiesThrombus Aging      +---------+---------------+---------+-----------+----------+-------------------+ CFV      Full           Yes      Yes                                      +---------+---------------+---------+-----------+----------+-------------------+ SFJ      Full                                                             +---------+---------------+---------+-----------+----------+-------------------+ FV Prox  Full                                                             +---------+---------------+---------+-----------+----------+-------------------+ FV Mid   Partial                                                          +---------+---------------+---------+-----------+----------+-------------------+ FV DistalFull  Yes      Yes                                      +---------+---------------+---------+-----------+----------+-------------------+ PFV      Full                                                             +---------+---------------+---------+-----------+----------+-------------------+ POP      Partial                                      recanalized flow,                                                         chronic             +---------+---------------+---------+-----------+----------+-------------------+  PTV      Full                                                             +---------+---------------+---------+-----------+----------+-------------------+ PERO     Full                                                             +---------+---------------+---------+-----------+----------+-------------------+     Summary: RIGHT: - Findings appear essentially unchanged compared to previous examination. - There is no evidence of deep vein thrombosis in the lower extremity.  LEFT: - There is recanalized thrombus in the left popliteal vein(s). - Findings consistent with chronic deep vein thrombosis involving the left femoral vein, and left popliteal vein. - Findings appear essentially unchanged compared to previous examination.  *See table(s) above for measurements and observations. Electronically signed by Monica Martinez MD on 11/04/2019 at 3:04:16 PM.    Final       Medications:     Scheduled Medications: . Chlorhexidine Gluconate Cloth  6 each Topical Daily  . mouth rinse  15 mL Mouth Rinse BID     Infusions: . sodium chloride Stopped (11/04/19 1356)  . heparin 1,500 Units/hr (11/05/19 0704)     PRN Medications:  acetaminophen **OR** acetaminophen, hydrALAZINE, ondansetron **OR** ondansetron (ZOFRAN) IV     Assessment/Plan   1. PEA arrest - suspect triggered by lysis of RA clot and embolization of fragments into PA. Now stable - continue on heparin   2. Submassive PE with severe RV strain - previous unprovoked clot in 2017. Now off NOAC x 9 months due to cost - recent COVID illness (11/20) and COVID vaccine as potential triggers - CT shows submassive bilateral PE with RV strain - initial echo LVEF 60-65% with  severe RV dilation and dysfunction + clot in transit - s/p 100mg  systemic T-PA 4/18 - repeat echo shows resolution of clot in transit. RV strain slightly improved - symptomatically improved. Will continue heparin.  - No need for ECMO currently - d/w ECMO  coordinators and surgeons personally - LE venous dopplers showed no DVT in RLE. There is recanalized thrombus in the left popliteal vein(s). 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 Unchanged from previous examination. ? IVC filter - will need lifelong AC  3. Acute hypoxic respiratory failure - due to #2 - continue supplemental O2. Improving with rx of PE.   4. HTN - remains elevated, continue to follow. Do not treat aggressively at this time  5. AKI on CKD 3a - likely due to RV strain +/- contrast. - SCr improving, 1.93>>1.34    Length of Stay: 2  Brittainy Simmons, PA-C  11/05/2019, 7:59 AM  Advanced Heart Failure Team Pager [phone] (M-F; 7a - 4p)  Please contact [facility] for night-coverage after hours (4p -7a ) and weekends on [email or web address]  Patient seen and examined with the above-signed Advanced Practice Provider and/or Housestaff. I personally reviewed laboratory data, imaging studies and relevant notes. I independently examined the patient and formulated the important aspects of the plan. I have edited the note to reflect any of my changes or salient points. I have personally discussed the plan with the patient and/or family.  He is much improved today after systemic t-PA given yesterday.   Sats 100%. RR < 20. SBP remains elevated. Creatinine improved  In bed on BIPAP (sleeps with it) HEENT: normal Neck: supple. JVP hard to see. Carotids 2+ bilat; no bruits. No lymphadenopathy or thryomegaly appreciated. Cor: PMI nondisplaced. Regular rate & rhythm. No rubs, gallops or murmurs. Lungs: clear Abdomen: obese soft, nontender, nondistended. No hepatosplenomegaly. No bruits or masses. Good bowel sounds. Extremities: no cyanosis, clubbing, rash, edema Neuro: alert & orientedx3, cranial nerves grossly intact. moves all 4 extremities w/o difficulty. Affect pleasant  Much improved after lytics. Rhythm  stable. Will get repeat limited echo today to reassess RV. Continue heparin. For venogram today. D/w CCM at bedside.   Glori Bickers, MD  9:38 AM

## 2019-11-05 NOTE — Plan of Care (Signed)

## 2019-11-05 NOTE — Progress Notes (Signed)
[place] for tPA>>heparin Indication: pulmonary embolus  No Known Allergies  Patient Measurements: Height: 5\' 9"  (175.3 cm) Weight: 93.4 kg (206 lb) IBW/kg (Calculated) : 70.7 Heparin Dosing Weight: 90 kg  Vital Signs: Temp: 97.8 F (36.6 C) (04/19 1552) BP: 151/95 (04/19 0800) Pulse Rate: 96 (04/19 0800)  Labs: Recent Labs    11/03/19 1722 11/04/19 0330 11/04/19 0400 11/04/19 0400 11/04/19 0635 11/04/19 1414 11/04/19 1426 11/04/19 2150 11/05/19 0629 11/05/19 1522  HGB 14.4  --  14.0   < >  --   --  14.3  --  13.9  --   HCT 43.6  --  41.6  --   --   --  43.3  --  40.9  --   PLT 143*  --  150  --   --   --  102*  --  127*  --   APTT  --   --   --   --   --  63*  --   --   --   --   LABPROT  --   --   --   --   --  22.5*  --   --  16.3*  --   INR  --   --   --   --   --  2.0*  --   --  1.3*  --   HEPARINUNFRC  --    < >  --   --    < >  --   --  0.28* 0.14* 0.28*  CREATININE 1.85*  --  1.76*  --   --   --  1.93*  --  1.34*  --   TROPONINIHS 91*  --   --   --   --   --   --   --   --   --    < > = values in this interval not displayed.    Estimated Creatinine Clearance: 68.7 mL/min (A) (by C-G formula based on SCr of 1.34 mg/dL (H)).   Medical History: Past Medical History:  Diagnosis Date  . DVT (deep venous thrombosis) ([place])   . Hypertension 1997   has never taken medications  . Tobacco dependence      Assessment: 68 yoM admitted with SOB found to have bilateral PE and atrial thrombus. Pt has hx VTE but has not taken any anticoagulants in months due to cost. Pt initially started on IV heparin, received IV tPA 4/18. Pharmacy asked to continue heparin.   A Heparin level this evening remains SUBtherapeutic after a rate increase earlier today (HL 0.28 << 0.14). The patient received IV tPA on 4/18 at 1200 - since the patient is >24h out from this procedure - will increase the heparin level goal range back up to 0.3-0.7. No  bleeding or issues noted per RN.  Goal of Therapy:  Heparin level 0.3-0.7 units/ml  Monitor platelets by anticoagulation protocol: Yes   Plan:  - Increase Heparin to 1900 units/hr (19 ml/hr) - Will continue to monitor for any signs/symptoms of bleeding and will follow up with heparin level in 6 hours   Thank you for allowing pharmacy to be a part of this patient's care.  Alycia Rossetti, PharmD, BCPS Clinical Pharmacist Clinical phone for 11/05/2019: [phone] 11/05/2019 4:18 PM   **Pharmacist phone directory can now be found on [email or web address] ([email or web address]).  Listed under [place].

## 2019-11-05 NOTE — Progress Notes (Signed)
ANTICOAGULATION CONSULT NOTE  Pharmacy Consult for tPA>>heparin Indication: pulmonary embolus  No Known Allergies  Patient Measurements: Height: 5\' 9"  (175.3 cm) Weight: 93.4 kg (206 lb) IBW/kg (Calculated) : 70.7 Heparin Dosing Weight: 91 kg  Vital Signs: Temp: 97.7 F (36.5 C) (04/19 0750) Temp Source: Oral (04/19 0400) BP: 151/95 (04/19 0800) Pulse Rate: 96 (04/19 0800)  Labs: Recent Labs    11/03/19 1722 11/04/19 0330 11/04/19 0400 11/04/19 0400 11/04/19 0635 11/04/19 1414 11/04/19 1426 11/04/19 2150 11/05/19 0629  HGB 14.4  --  14.0   < >  --   --  14.3  --  13.9  HCT 43.6  --  41.6  --   --   --  43.3  --  40.9  PLT 143*  --  150  --   --   --  102*  --  127*  APTT  --   --   --   --   --  63*  --   --   --   LABPROT  --   --   --   --   --  22.5*  --   --  16.3*  INR  --   --   --   --   --  2.0*  --   --  1.3*  HEPARINUNFRC  --    < >  --   --  0.30  --   --  0.28* 0.14*  CREATININE 1.85*  --  1.76*  --   --   --  1.93*  --  1.34*  TROPONINIHS 91*  --   --   --   --   --   --   --   --    < > = values in this interval not displayed.    Estimated Creatinine Clearance: 68.7 mL/min (A) (by C-G formula based on SCr of 1.34 mg/dL (H)).   Medical History: Past Medical History:  Diagnosis Date  . DVT (deep venous thrombosis) ([place])   . Hypertension 1997   has never taken medications  . Tobacco dependence      Assessment: 83 yoM admitted with SOB found to have bilateral PE and atrial thrombus. Pt has hx VTE but has not taken any anticoagulants in months due to cost. Pt initially started on IV heparin, received IV tPA 4/18. Pharmacy asked to continue heparin.   4/19 AM: Heparin level 0.14 (sub-therapeutic) on heparin gtt at 1500 units/hr. Hbg wnl, plt 127. RN reports no concern for bleeding.   Goal of Therapy:  Heparin level 0.3-0.7 units/ml (0.3-0.5 x24h) Monitor platelets by anticoagulation protocol: Yes   Plan:  Increase heparin gtt to 1700  units/hr Obtain 6hr heparin level  Monitor daily CBC and heparin level Monitor s/sx of bleeding closely with recent tPA   Acey Lav, PharmD  PGY1 [place] Resident 11/05/2019 10:08 AM

## 2019-11-05 NOTE — Progress Notes (Signed)
0800 Bedrest complete per rounding team; ok for pt to get up to chair today  1200 To CT for venogram Pt able to stand and pivot from bed to table for scan x2

## 2019-11-05 NOTE — TOC Benefit Eligibility Note (Signed)
Transition of Care [facility]) Benefit Eligibility Note    Patient Details  Name: Mitchell Cooper MRN: [id number] Date of Birth: Jan 05, 1961   Medication/Dose: Arne [place]  5 MG BID                       Additional Notes: PATIENT SELF -PAY   and   MEDICAID  POTENTIAL  EFF-DATE : 11-03-2019    Memory Argue Phone Number: 11/05/2019, 9:27 AM

## 2019-11-05 NOTE — Progress Notes (Signed)
*  PRELIMINARY RESULTS* Echocardiogram 2D Echocardiogram LIMITED has been performed.  Leavy Cella 11/05/2019, 12:46 PM

## 2019-11-05 NOTE — Progress Notes (Addendum)
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  Last PE was approximately 3 years ago in 2017 and patient states that he stopped taking Eliquis approximately 9 months ago because he could not afford it.  Received his second Covid-19 vaccination several days before noting progressive shortness of breath.  No fevers, chills, coughing, URI symptoms, chest pain or leg pain and swelling. SOB bnoted on Tuesday. Progressed over the past few days with worsening DOE. No cough. No hemoptysis.   In the emergency department, patient was tachycardic in the 130s, tachypneic and oxygen saturations as low as 91% so he was placed on 3 L nasal cannula.  He has not been hypotensive.  BNP 637, high-sensitivity troponin 91, creatinine 1.8.  CTA was obtained showing multiple large central and peripheral PEs with filling defects bilaterally and concernforrightheartstrain therefore PCCM consulted.  Past Medical History  has a past medical history of DVT (deep venous thrombosis) ([place]), Hypertension (1997), and Tobacco dependence.  Significant Hospital Events   4/17 admit for pulmonary embolism 4/18 Echo RV markedly dilated with severe RV dysfunction and large mobile clot extending from IVC into RA bobbing across TV. RVSP ~75mmHG. 4/18 tPA administered 4/18 PEA arrest and subsequent  ROSC 4/18 POCUS/Echo IVC/RA clot no longer present. Sats 100% on HFNC   Consults:  PCCM  Procedures:  4/18 tPA given  Significant Diagnostic Tests:  4/17 CTA chest>>Multiple large central and peripheral pulmonary artery filling defects bilaterally  4/18 Vas US>>  RIGHT:  - Findings appear essentially unchanged compared to previous examination.  - There is no evidence of deep vein thrombosis in the lower extremity.  LEFT:  - There is recanalized thrombus in the left popliteal vein(s).  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  - Findings appear essentially unchanged compared to previous examination.   4/18 Echo>> 1. 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 Consistent with thrombus in  transit.  2. RV free wall akinesis with improved function at apex, consistent with  McConnell's sign as can be seen in acute PE. Right ventricular systolic  function is severely reduced. The right ventricular size is moderately  enlarged. There is moderately  elevated pulmonary artery systolic pressure. The estimated right  ventricular systolic pressure is 93.2 mmHg.  3. Left ventricular ejection fraction, by estimation, is 60 to 65%. The  left ventricle has normal function. The left ventricle has no regional  wall motion abnormalities. There is mild concentric left ventricular  hypertrophy. Left ventricular diastolic  parameters are indeterminate. There is the interventricular septum is  flattened in systole and diastole, consistent with right ventricular  pressure and volume overload.  4. Right atrial size was moderately dilated.  5. The mitral valve is normal in structure.  No evidence of mitral valve  regurgitation.  6. The aortic valve is tricuspid. Aortic valve regurgitation is not  visualized. No aortic stenosis is present.  7. The inferior  vena cava is dilated in size with <50% respiratory  variability, suggesting right atrial pressure of 15 mmHg.  4/19 CT venogram>> pending   Micro Data:  4/17 Covid-19>> negative  Antimicrobials:  None  Interim history/subjective:  Patient resting comfortably in bed.   Objective   Blood pressure (!) 144/86, pulse 78, temperature 97.7 F (36.5 C), temperature source Oral, resp. rate 19, height 5\' 9"  (1.753 m), weight 93.4 kg, SpO2 98 %.        Intake/Output Summary (Last 24 hours) at 11/05/2019 0532 Last data filed at 11/05/2019 0500 Gross per 24 hour  Intake 1683.77 ml  Output 776 ml  Net 907.77 ml   Filed Weights   11/03/19 1708 11/04/19 0438  Weight: 97.5 kg 93.4 kg    Examination: Physical Exam  Constitutional: He is oriented to person, place, and time. No distress.  HENT:  Head: Normocephalic and atraumatic.  Eyes: EOM are normal.  Cardiovascular: Normal rate and intact distal pulses.  Pulmonary/Chest: Effort normal. No respiratory distress. He has rales.  Abdominal: Soft. He exhibits no distension.  Musculoskeletal:        General: Edema (minimal) present. Normal range of motion.     Cervical back: Normal range of motion.  Neurological: He is alert and oriented to person, place, and time.  Skin: Skin is warm and dry. He is not diaphoretic.   Resolved Hospital Problem list     Assessment & Plan:   High risk submassive PE with clot in transit, s/p systemic lytics. - Continue to monitor for signs bleeding - CT venogram today - Continue anticoagulation with heparin gtt.  - On RA and saturating well.  - Repeat Echo limited for RV function   Best practice:  Diet: NPO Pain/Anxiety/Delirium protocol (if indicated): APAP VAP protocol (if indicated): n/a DVT prophylaxis: Heparin GI prophylaxis: none Glucose control: none Mobility: bedrest Code Status: full Family Communication: none Disposition: ICU  Labs   CBC: Recent Labs  Lab 11/03/19 1722  11/04/19 0400 11/04/19 1426  WBC 11.5* 11.7* 13.8*  NEUTROABS  --   --  10.1*  HGB 14.4 14.0 14.3  HCT 43.6 41.6 43.3  MCV 81.3 81.4 83.4  PLT 143* 150 102*    Basic Metabolic Panel: Recent Labs  Lab 11/03/19 1722 11/04/19 0400 11/04/19 1426  NA 141 141 137  K 4.1 4.8 4.2  CL 109 112* 107  CO2 21* 21* 18*  GLUCOSE 121* 98 136*  BUN 20 21* 23*  CREATININE 1.85* 1.76* 1.93*  CALCIUM 8.6* 8.3* 7.9*  MG  --   --  2.1   GFR: Estimated Creatinine Clearance: 47.7 mL/min (A) (by C-G formula based on SCr of 1.93 mg/dL (H)). Recent Labs  Lab 11/03/19 1722 11/04/19 0400 11/04/19 1414 11/04/19 1426  WBC 11.5* 11.7*  --  13.8*  LATICACIDVEN  --   --  3.7*  --     Liver Function Tests: Recent Labs  Lab 11/04/19 1426  AST 46*  ALT 31  ALKPHOS 61  BILITOT 1.4*  PROT 6.3*  ALBUMIN 2.8*   No results for input(s): LIPASE, AMYLASE in the last 168 hours. No results for input(s): AMMONIA in the last 168 hours.  ABG No results found for: PHART, PCO2ART, PO2ART, HCO3, TCO2, ACIDBASEDEF, O2SAT   Coagulation Profile: Recent Labs  Lab  11/04/19 1414  INR 2.0*    Cardiac Enzymes: No results for input(s): CKTOTAL, CKMB, CKMBINDEX, TROPONINI in the last 168 hours.  HbA1C: Hgb A1c MFr Bld  Date/Time Value Ref Range Status  06/14/2016 04:42 PM 5.5 4.8 - 5.6 % Final    Comment:    (NOTE)         Pre-diabetes: 5.7 - 6.4         Diabetes: >6.4         Glycemic control for adults with diabetes: <7.0     CBG: No results for input(s): GLUCAP in the last 168 hours.  Review of Systems:   Negative except for what is noted on subjective.   Past Medical History  He,  has a past medical history of DVT (deep venous thrombosis) ([place]), Hypertension (1997), and Tobacco dependence.   Surgical History    Past Surgical History:  Procedure Laterality Date  . KNEE SURGERY  approx. 1984   reconstructive surgery of left knee; arthroscopic procedure about 1997  . PERIPHERAL  VASCULAR CATHETERIZATION Left 06/16/2016   Procedure: Lower Extremity Venography- Venus Thrombolysis;  Surgeon: Serafina Mitchell, MD;  Location: [place] [facility];  Service: Cardiovascular;  Laterality: Left;     Social History   reports that he quit smoking about 2 years ago. His smoking use included cigarettes. He has never used smokeless tobacco. He reports current alcohol use of about 6.0 standard drinks of alcohol per week. He reports that he does not use drugs.   Family History   His family history includes CAD (age of onset: 30) in his mother.   Allergies No Known Allergies   Home Medications  Prior to Admission medications   Medication Sig Start Date End Date Taking? Authorizing Provider  amLODipine (NORVASC) 10 MG tablet Take 1 tablet (10 mg total) by mouth daily. 12/10/18   Langston Masker B, PA-C  apixaban (ELIQUIS) 5 MG TABS tablet 10 mg BID x 14 doses then 5 mg BID Patient not taking: Reported on 02/21/2017 06/17/16   Geradine Girt, DO  aspirin EC 325 MG tablet Take 1 tablet (325 mg total) by mouth daily. 06/17/16   Geradine Girt, DO     Critical care time:      Marianna Payment, D.O. [facility], PGY-1 Pager: [phone], Phone: [phone] Date 11/05/2019 Time 6:47 AM   CCM attending:  59 year old gentleman admitted to the intensive care unit for submassive PE with clot in transit noted within the SVC/RA and RV.  Patient underwent systemic dose 100 mg TPA.  Recently during the TPA administration patient suffered cardiac arrest for only a few seconds came back with chest compressions. Since then otherwise improving.  At this point able to come all the way off of his oxygen requirement to room air as of this morning.  BP (!) 135/91 (BP Location: Right Arm)   Pulse (!) 106   Temp 97.8 F (36.6 C)   Resp (!) 24   Ht 5\' 9"  (1.753 m)   Wt 93.4 kg   SpO2 96%   BMI 30.42 kg/m   General: Male, resting in bed no distress Lungs: Clear to auscultation  bilaterally Heart: Regular rhythm S1-S2 Abdomen: Soft nontender nondistended  Labs: Reviewed hemoglobin stable CT imaging: No SVC clot.  Of note incidental hepatic lesion found.  Bilateral pulmonary emboli present The patient's images have been independently reviewed by me.    Assessment: Massive PE Suffered cardiac arrest following thrombolysis for clot in  transit. Acute respiratory failure related to above, oxygen requirement now able weaned down likely going to require oxygen with ambulation.  Plan: Observe in the intensive care unit until venogram complete. Continue heparin for anticoagulation We appreciate pharmacy input. Likely stable for transfer to the floor following tomorrow.  This patient is critically ill with multiple organ system failure; which, requires frequent high complexity decision making, assessment, support, evaluation, and titration of therapies. This was completed through the application of advanced monitoring technologies and extensive interpretation of multiple databases. During this encounter critical care time was devoted to patient care services described in this note for 32 minutes.  Garner Nash, DO [facility] 11/05/2019 6:24 PM

## 2019-11-06 LAB — HEPARIN LEVEL (UNFRACTIONATED): Heparin Unfractionated: 0.32 IU/mL (ref 0.30–0.70)

## 2019-11-06 LAB — CBC
HCT: 40.8 % (ref 39.0–52.0)
Hemoglobin: 13.8 g/dL (ref 13.0–17.0)
MCH: 27.5 pg (ref 26.0–34.0)
MCHC: 33.8 g/dL (ref 30.0–36.0)
MCV: 81.3 fL (ref 80.0–100.0)
Platelets: 141 10*3/uL — ABNORMAL LOW (ref 150–400)
RBC: 5.02 MIL/uL (ref 4.22–5.81)
RDW: 14.7 % (ref 11.5–15.5)
WBC: 9.8 10*3/uL (ref 4.0–10.5)
nRBC: 0 % (ref 0.0–0.2)

## 2019-11-06 LAB — BASIC METABOLIC PANEL
Anion gap: 8 (ref 5–15)
BUN: 14 mg/dL (ref 6–20)
CO2: 23 mmol/L (ref 22–32)
Calcium: 8.5 mg/dL — ABNORMAL LOW (ref 8.9–10.3)
Chloride: 108 mmol/L (ref 98–111)
Creatinine, Ser: 1.27 mg/dL — ABNORMAL HIGH (ref 0.61–1.24)
GFR calc Af Amer: >60 mL/min (ref >60)
GFR calc non Af Amer: >60 mL/min (ref >60)
Glucose, Bld: 115 mg/dL — ABNORMAL HIGH (ref 70–99)
Potassium: 3.7 mmol/L (ref 3.5–5.1)
Sodium: 139 mmol/L (ref 135–145)

## 2019-11-06 MED ORDER — AMLODIPINE BESYLATE 10 MG PO TABS
10.0000 mg | ORAL_TABLET | Freq: Every day | ORAL | Status: DC
  Administered 2019-11-06: 10 mg via ORAL
  Filled 2019-11-06: qty 1

## 2019-11-06 MED ORDER — RIVAROXABAN 15 MG PO TABS
15.0000 mg | ORAL_TABLET | ORAL | Status: AC
  Administered 2019-11-06: 15 mg via ORAL
  Filled 2019-11-06: qty 1

## 2019-11-06 MED ORDER — RIVAROXABAN (XARELTO) VTE STARTER PACK (15 & 20 MG): ORAL_TABLET | ORAL | 0 refills | Status: DC

## 2019-11-06 MED ORDER — AMLODIPINE BESYLATE 10 MG PO TABS: 10.0000 mg | ORAL_TABLET | Freq: Every day | ORAL | 1 refills | Status: DC

## 2019-11-06 MED ORDER — RIVAROXABAN 20 MG PO TABS: 20.0000 mg | ORAL_TABLET | Freq: Every day | ORAL | 1 refills | Status: DC

## 2019-11-06 MED FILL — XARELTO STARTER PACK: 15 & 20 | 30 days supply | Qty: 51 | Fill #0

## 2019-11-06 MED FILL — AMLODIPINE BESYLATE 10 MG T: 10 | 30 days supply | Qty: 30 | Fill #0

## 2019-11-06 NOTE — Progress Notes (Signed)
[facility] for tPA>>heparin Indication: pulmonary embolus  Assessment: 7 yoM admitted with SOB found to have bilateral PE and atrial thrombus. Pt has hx VTE but has not taken any anticoagulants in months due to cost. Pt initially started on IV heparin, received IV tPA 4/18. Pharmacy asked to continue heparin.   Heparin level 0.32 units/ml  Goal of Therapy:  Heparin level 0.3-0.7 units/ml  Monitor platelets by anticoagulation protocol: Yes   Plan:  - Continue Heparin at 1900 units/hr (19 ml/hr) Daily heparin level and CBC Monitor for bleeding complications  Thanks for allowing pharmacy to be a part of this patient's care.  Excell Seltzer, PharmD Clinical Pharmacist

## 2019-11-06 NOTE — Discharge Instructions (Signed)
Information on my medicine - XARELTO (rivaroxaban)  This medication education was reviewed with me or my healthcare representative as part of my discharge preparation. WHY WAS XARELTO PRESCRIBED FOR YOU? Xarelto was prescribed to treat blood clots that may have been found in the veins of your legs (deep vein thrombosis) and  in your lungs (pulmonary embolism) and to reduce the risk of them occurring again.  What do you need to know about Xarelto? The starting dose is one 15 mg tablet taken TWICE daily with food for the FIRST 21 DAYS then on May 112021 the dose is changed to one 20 mg tablet taken ONCE A DAY with your evening meal.  DO NOT stop taking Xarelto without talking to the health care provider who prescribed the medication.  Refill your prescription for 20 mg tablets before you run out.  After discharge, you should have regular check-up appointments with your healthcare provider that is prescribing your Xarelto.  In the future your dose may need to be changed if your kidney function changes by a significant amount.  What do you do if you miss a dose? If you are taking Xarelto TWICE DAILY and you miss a dose, take it as soon as you remember. You may take two 15 mg tablets (total 30 mg) at the same time then resume your regularly scheduled 15 mg twice daily the next day.  If you are taking Xarelto ONCE DAILY and you miss a dose, take it as soon as you remember on the same day then continue your regularly scheduled once daily regimen the next day. Do not take two doses of Xarelto at the same time.   Important Safety Information Xarelto is a blood thinner medicine that can cause bleeding. You should call your healthcare provider right away if you experience any of the following: ? Bleeding from an injury or your nose that does not stop. ? Unusual colored urine (red or dark brown) or unusual colored stools (red or black). ? Unusual bruising for unknown reasons. ? A serious fall or  if you hit your head (even if there is no bleeding).  Some medicines may interact with Xarelto and might increase your risk of bleeding while on Xarelto. To help avoid this, consult your healthcare provider or pharmacist prior to using any new prescription or non-prescription medications, including herbals, vitamins, non-steroidal anti-inflammatory drugs (NSAIDs) and supplements.  This website has more information on Xarelto: [email or web address]

## 2019-11-06 NOTE — Progress Notes (Signed)
Advanced Heart Failure Rounding Note  PCP-Cardiologist: No primary care provider on file.    Subjective:    [name] yesterday with no residual clot. Repeat echo with improving RV function and no residual clot in transit.  Feels good. Denies SOB.No CP. No hemoptyisis. Vitals stable.    Objective:   Weight Range: 93.4 kg Body mass index is 30.42 kg/m.   Vital Signs:   Temp:  [97.7 F (36.5 C)-98.9 F (37.2 C)] 98.3 F (36.8 C) (04/20 0326) Pulse Rate:  [86-111] 86 (04/20 0600) Resp:  [16-28] 18 (04/20 0600) BP: (111-164)/(69-122) 160/96 (04/20 0600) SpO2:  [93 %-98 %] 95 % (04/20 0600) Last BM Date: 11/04/19  Weight change: Filed Weights   11/03/19 1708 11/04/19 0438  Weight: 97.5 kg 93.4 kg    Intake/Output:   Intake/Output Summary (Last 24 hours) at 11/06/2019 0714 Last data filed at 11/06/2019 0600 Gross per 24 hour  Intake 1511.64 ml  Output 1075 ml  Net 436.64 ml      Physical Exam    General:  Well appearing. No resp difficulty HEENT: normal Neck: supple. no JVD. Carotids 2+ bilat; no bruits. No lymphadenopathy or thryomegaly appreciated. Cor: PMI nondisplaced. Regular rate & rhythm. No rubs, gallops or murmurs. Lungs: clear Abdomen: obese soft, nontender, nondistended. No hepatosplenomegaly. No bruits or masses. Good bowel sounds. Extremities: no cyanosis, clubbing, rash, edema Neuro: alert & orientedx3, cranial nerves grossly intact. moves all 4 extremities w/o difficulty. Affect pleasant   Telemetry   NSR 80-90s  Personally reviewed  EKG    No EKG to review   Labs    CBC Recent Labs    11/04/19 1426 11/04/19 1426 11/05/19 0629 11/06/19 0231  WBC 13.8*   < > 9.5 9.8  NEUTROABS 10.1*  --   --   --   HGB 14.3   < > 13.9 13.8  HCT 43.3   < > 40.9 40.8  MCV 83.4   < > 80.8 81.3  PLT 102*   < > 127* 141*   < > = values in this interval not displayed.   Basic Metabolic Panel Recent Labs    11/04/19 1426 11/04/19 1426  11/05/19 0629 11/06/19 0231  NA 137   < > 139 139  K 4.2   < > 4.0 3.7  CL 107   < > 108 108  CO2 18*   < > 20* 23  GLUCOSE 136*   < > 103* 115*  BUN 23*   < > 17 14  CREATININE 1.93*   < > 1.34* 1.27*  CALCIUM 7.9*   < > 8.4* 8.5*  MG 2.1  --   --   --    < > = values in this interval not displayed.   Liver Function Tests Recent Labs    11/04/19 1426  AST 46*  ALT 31  ALKPHOS 61  BILITOT 1.4*  PROT 6.3*  ALBUMIN 2.8*   No results for input(s): LIPASE, AMYLASE in the last 72 hours. Cardiac Enzymes No results for input(s): CKTOTAL, CKMB, CKMBINDEX, TROPONINI in the last 72 hours.  BNP: BNP (last 3 results) Recent Labs    11/03/19 1722  BNP 637.0*    ProBNP (last 3 results) No results for input(s): PROBNP in the last 8760 hours.   D-Dimer No results for input(s): DDIMER in the last 72 hours. Hemoglobin A1C No results for input(s): HGBA1C in the last 72 hours. Fasting Lipid Panel No results for input(s): CHOL,  HDL, LDLCALC, TRIG, CHOLHDL, LDLDIRECT in the last 72 hours. Thyroid Function Tests No results for input(s): TSH, T4TOTAL, T3FREE, THYROIDAB in the last 72 hours.  Invalid input(s): FREET3  Other results:   Imaging    CT VENOGRAM ABD/PEL  Result Date: 11/05/2019 CLINICAL DATA:  59 year old with pulmonary emboli and right atrial thrombus on echocardiography. Evaluate IVC for patency and thrombus. EXAM: CT ABDOMEN AND PELVIS WITH CONTRAST (CT venogram protocol) TECHNIQUE: Multidetector CT imaging of the abdomen during the portal venous phase of contrast and images of the abdomen and pelvis during the delayed venous phase of contrast CONTRAST:  116mL OMNIPAQUE IOHEXOL 350 MG/ML SOLN COMPARISON:  Chest CT 11/03/2019 FINDINGS: Lower chest: Pulmonary emboli identified in the lower lobe pulmonary artery branches and compatible with recent chest CTA findings. Dependent densities in the left lower lobe likely represent atelectasis. Possible trace left pleural  fluid. Hepatobiliary: Poorly defined hyperattenuation involving the lateral left hepatic lobe on sequence 3, image 22. No discrete lesion in this area on the delayed imaging. Portal venous system is patent. Normal appearance of the gallbladder. No significant biliary dilatation. Pancreas: Unremarkable. No pancreatic ductal dilatation or surrounding inflammatory changes. Spleen: Normal in size without focal abnormality. Adrenals/Urinary Tract: Low-density fullness of both adrenal glands may represent hyperplasia. Normal appearance of both kidneys without hydronephrosis. Normal appearance of the urinary bladder. Small low-density structures in both kidneys suggestive for small cortical cysts. Stomach/Bowel: Colonic diverticulosis without acute colonic inflammation. Evidence for normal gas-filled appendix. Normal appearance of the stomach. Vascular/Lymphatic: Normal caliber of the abdominal aorta with mild atherosclerotic disease. Main visceral arteries are patent. Common iliac arteries are tortuous. Portal venous system is patent. IVC is widely patent without thrombus. Bilateral renal veins are patent. There is a left common iliac venous stent that extends into the distal IVC. Stent appears to be patent. Right iliac venous system is patent. Hepatic veins appear to be patent. Reproductive: Normal appearance of the prostate. Other: No ascites.  Negative for free air. Musculoskeletal: No acute bone abnormality. IMPRESSION: 1. IVC is patent.  No evidence for IVC thrombus. 2. Re-demonstration of bilateral pulmonary emboli. 3. Left common iliac venous stent appears to be patent. 4. **An incidental finding of potential clinical significance has been found. Poorly defined hyperdense or enhancing area along the lateral left hepatic lobe. Suspect this represents a perfusion abnormality but difficult to exclude an underlying lesion in this area. Recommend follow-up MRI of the liver, with and without contrast, as an outpatient  when the patient has recovered from the acute event and can adequately hold his breath for a diagnostic study.** 5. Colonic diverticulosis. Electronically Signed   By: Markus Daft M.D.   On: 11/05/2019 15:13   ECHOCARDIOGRAM LIMITED  Result Date: 11/05/2019    ECHOCARDIOGRAM LIMITED REPORT   Patient Name:   Mitchell Cooper Date of Exam: 11/05/2019 Medical Rec #:  [id number]         Height:       69.0 in Accession #:    [id number]        Weight:       206.0 lb Date of Birth:  1960/09/27        BSA:          2.092 m Patient Age:    59 years          BP:           151/96 mmHg Patient Gender: M  HR:           96 bpm. Exam Location:  Inpatient Procedure: Limited Echo Indications:    Pulmonary Embolus 415.19 / I26.99  History:        Patient has prior history of Echocardiogram examinations, most                 recent 11/04/2019. Risk Factors:Hypertension and Former Smoker.                 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  Sonographer:    Leavy Cella Referring Phys: [id number] [place]  1. Left ventricular diastolic parameters are consistent with Grade I diastolic dysfunction (impaired relaxation).  2. RV base diameter 4.6 cm.     When compared to prior, RV function has improved. . Right ventricular systolic function is moderately reduced. The right ventricular size is moderately enlarged. There is moderately elevated pulmonary artery systolic pressure. The estimated right ventricular systolic pressure is 19.6 mmHg.  3. When compared to prior, RA thrombus is no longer present. . Right atrial size was moderately dilated.  4. No evidence of mitral valve regurgitation.  5. Tricuspid valve regurgitation is moderate.  6. The inferior vena cava is normal in size with greater than 50% respiratory variability, suggesting right atrial pressure of 3 mmHg. FINDINGS  Right Ventricle: RV base diameter 4.6 cm. When  compared to prior, RV function has improved. The right ventricular size is moderately enlarged. Right ventricular systolic function is moderately reduced. There is moderately elevated pulmonary artery systolic pressure. The tricuspid regurgitant velocity is 2.86 m/s, and with an assumed right atrial pressure of 10 mmHg, the estimated right ventricular systolic pressure is 22.2 mmHg. Right Atrium: When compared to prior, RA thrombus is no longer present. Right atrial size was moderately dilated. Tricuspid Valve: Tricuspid valve regurgitation is moderate. Venous: The inferior vena cava is normal in size with greater than 50% respiratory variability, suggesting right atrial pressure of 3 mmHg.   Diastology LV e' lateral:   12.80 cm/s LV E/e' lateral: 3.5 LV e' medial:    8.92 cm/s LV E/e' medial:  5.0  RIGHT VENTRICLE RV S prime:     13.90 cm/s TAPSE (M-mode): 1.7 cm RIGHT ATRIUM           Index RA Area:     19.70 cm RA Volume:   62.10 ml  29.68 ml/m  MITRAL VALVE               TRICUSPID VALVE MV Area (PHT): 2.03 cm    TR Peak grad:   32.7 mmHg MV Decel Time: 373 msec    TR Vmax:        286.00 cm/s MV E velocity: 44.60 cm/s MV A velocity: 54.80 cm/s MV E/A ratio:  0.81 Candee Furbish MD Electronically signed by Candee Furbish MD Signature Date/Time: 11/05/2019/2:17:13 PM    Final      Medications:     Scheduled Medications: . Chlorhexidine Gluconate Cloth  6 each Topical Daily    Infusions: . heparin 1,900 Units/hr (11/06/19 0600)    PRN Medications: acetaminophen **OR** acetaminophen, ondansetron **OR** ondansetron (ZOFRAN) IV     Assessment/Plan   1. PEA arrest on 4/18 - suspect triggered by lysis of RA clot and embolization of fragments into PA. Now stable - continue on heparin  2. Submassive PE with severe RV strain - previous unprovoked clot in 2017. Now off NOAC x 9 months due to cost - recent COVID illness (11/20) and COVID vaccine as potential triggers - CT shows submassive bilateral  PE with RV strain - initial echo LVEF 60-65% with severe RV dilation and dysfunction + clot in transit - s/p 100mg  systemic T-PA 4/18 - repeat echo shows resolution of clot in transit. RV function continues to improve - Remains on heparin can switch to NOAC if OK with CCM - will need outpatient f/u to make sure no residual CTEPH  3. Acute hypoxic respiratory failure - due to #2 - resolved  4. HTN - remains elevated - restart home amlodipine   5. AKI on CKD 3a - likely due to RV strain +/- contrast. -  Resolved creatinine 1.27   We will sign off. I will arrange outpatient f/u with me to follow for development of CTEPH   Length of Stay: 3  Glori Bickers, MD  11/06/2019, 7:14 AM  Advanced Heart Failure Team Pager [phone] (M-F; [place])  Please contact [facility] for night-coverage after hours (4p -7a ) and weekends on [email or web address]

## 2019-11-06 NOTE — Progress Notes (Addendum)
NAME:  Mitchell Cooper, MRN:  [id number], DOB:  1961/06/01, LOS: 3 ADMISSION DATE:  11/03/2019, CONSULTATION DATE:  11/04/19 REFERRING MD:  EDP, CHIEF COMPLAINT:  Dyspnea   Brief History   59 year old male with a past medical history of DVT/PE  not on anticoagulation, hypertension and tobacco abuse who presented with 4 days of progressive dyspnea and found to have central and peripheral PE's bilaterally with evidence of right heart strain on CTA.  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  Last PE was approximately 3 years ago in 2017 and patient states that he stopped taking Eliquis approximately 9 months ago because he could not afford it.  Received his second Covid-19 vaccination several days before noting progressive shortness of breath.  No fevers, chills, coughing, URI symptoms, chest pain or leg pain and swelling. SOB bnoted on Tuesday. Progressed over the past few days with worsening DOE. No cough. No hemoptysis.   In the emergency department, patient was tachycardic in the 130s, tachypneic and oxygen saturations as low as 91% so he was placed on 3 L nasal cannula.  He has not been hypotensive.  BNP 637, high-sensitivity troponin 91, creatinine 1.8.  CTA was obtained showing multiple large central and peripheral PEs with filling defects bilaterally and concernforrightheartstrain therefore PCCM consulted.  Past Medical History  has a past medical history of DVT (deep venous thrombosis) ([place]), Hypertension (1997), and Tobacco dependence.  Significant Hospital Events   4/17 admit for pulmonary embolism 4/18 Echo RV markedly dilated with severe RV dysfunction and large mobile clot extending from IVC into RA bobbing across TV. RVSP ~37mmHG. 4/18 tPA administered 4/18 PEA arrest and subsequent  ROSC 4/18 POCUS/Echo IVC/RA clot no longer present. Sats 100% on HFNC   Consults:  PCCM  Procedures:  4/18 tPA given  Significant Diagnostic Tests:   4/17 CTA chest>> Multiple large central and peripheral pulmonary artery filling defects bilaterally  4/18 Vas US>>  RIGHT:  - Findings appear essentially unchanged compared to previous examination.  - There is no evidence of deep vein thrombosis in the lower extremity.  LEFT:  - There is recanalized thrombus in the left popliteal vein(s).  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  - Findings appear essentially unchanged compared to previous examination.   4/18 Echo>> 1. 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 Consistent with thrombus in  transit.  2. RV free wall akinesis with improved function at apex, consistent with  McConnell's sign as can be seen in acute PE. Right ventricular systolic  function is severely reduced. The right ventricular size is moderately  enlarged. There is moderately  elevated pulmonary artery systolic pressure. The estimated right  ventricular systolic pressure is 13.0 mmHg.  3. Left ventricular ejection fraction, by estimation, is 60 to 65%. The  left ventricle has normal function. The left ventricle has no regional  wall motion abnormalities. There is mild concentric left ventricular  hypertrophy. Left ventricular diastolic  parameters are indeterminate. There is the interventricular septum is  flattened in systole and diastole, consistent with right ventricular  pressure and volume overload.  4. Right atrial size was moderately dilated.  5. The mitral valve is normal  in structure. No evidence of mitral valve  regurgitation.  6. The aortic valve is tricuspid. Aortic valve regurgitation is not  visualized. No aortic stenosis is present.  7. The  inferior vena cava is dilated in size with <50% respiratory  variability, suggesting right atrial pressure of 15 mmHg.  4/19 CT venogram>> IMPRESSION: 1. IVC is patent.  No evidence for IVC thrombus. 2. Re-demonstration of bilateral pulmonary emboli. 3. Left common iliac venous stent appears to be patent. 4. **An incidental finding of potential clinical significance has been found. Poorly defined hyperdense or enhancing area along the lateral left hepatic lobe. Suspect this represents a perfusion abnormality but difficult to exclude an underlying lesion in this area. Recommend follow-up MRI of the liver, with and without contrast, as an outpatient when the patient has recovered from the acute event and can adequately hold his breath for a diagnostic study.** 5. Colonic diverticulosis.  4/19 limited TTE >> 1. Left ventricular diastolic parameters are consistent with Grade I  diastolic dysfunction (impaired relaxation).  2. RV base diameter 4.6 cm.   When compared to prior, RV function has improved. . Right ventricular  systolic function is moderately reduced. The right ventricular size is  moderately enlarged. There is moderately elevated pulmonary artery  systolic pressure. The estimated right  ventricular systolic pressure is 16.1 mmHg.  3. When compared to prior, RA thrombus is no longer present. . Right  atrial size was moderately dilated.  4. No evidence of mitral valve regurgitation.  5. Tricuspid valve regurgitation is moderate.  6. The inferior vena cava is normal in size with greater than 50%  respiratory variability, suggesting right atrial pressure of 3 mmHg.   Micro Data:  4/17 Covid-19>> negative  Antimicrobials:  None  Interim history/subjective:   Patient states he is feeling good this morning. He was able to enjoy breakfast without difficulty. He denies any SOB, orthopnea. He continues to have some chest soreness from compressions but feels it is  improving. Mitchell Cooper is feeling happy to be moving in the right direction.   Objective   Blood pressure (!) 141/103, pulse 91, temperature 98.3 F (36.8 C), temperature source Oral, resp. rate 16, height 5\' 9"  (1.753 m), weight 93.4 kg, SpO2 93 %.        Intake/Output Summary (Last 24 hours) at 11/06/2019 0645 Last data filed at 11/06/2019 0500 Gross per 24 hour  Intake 1387.66 ml  Output 1075 ml  Net 312.66 ml   Filed Weights   11/03/19 1708 11/04/19 0438  Weight: 97.5 kg 93.4 kg   Physical Exam Vitals and nursing note reviewed.  Constitutional:      General: He is not in acute distress.    Appearance: He is normal weight.  HENT:     Head: Normocephalic and atraumatic.  Cardiovascular:     Rate and Rhythm: Normal rate and regular rhythm.     Heart sounds: No murmur. No gallop.   Pulmonary:     Effort: No tachypnea, accessory muscle usage or respiratory distress.     Breath sounds: Rales (minimal fine crackles in bilateral bases) present. No decreased breath sounds, wheezing or rhonchi.  Abdominal:     General: Bowel sounds are normal.     Tenderness: There is no abdominal tenderness.  Neurological:     General: No focal deficit present.     Mental Status: He is alert and oriented to person, place, and time.  Psychiatric:        Mood and  Affect: Mood normal.        Behavior: Behavior normal.     Assessment & Plan:   # High risk submassive PE with clot in transit, s/p systemic lytics # Chronic DVTs - Repeat TTE shows improved in RV function and lack of thrombus, but with continued dilation and elevated pulmonary pressure  - CT venogram did not show IVC thrombus - Continue to monitor for signs bleeding - Transition to Xarelto today  - Discharge today   # Acute hypoxic respiratory failure - Resolved   # Hypertension - Restart home Amlodipine   # AKI on CKD Stage 3a - Creatinine continues to improve at 1.27 today, CrCl 72.4 - Avoid nephrotoxic agents   #  Thrombocytopenia  - Improving   Best practice:  Diet: Regular  Pain/Anxiety/Delirium protocol (if indicated): N/A VAP protocol (if indicated): N/A DVT prophylaxis: Xarelto GI prophylaxis: N/A Glucose control: N/A Mobility: bedrest Code Status: FULL Family Communication: Patient states he can update family  Disposition: Home today   Labs   CBC: Recent Labs  Lab 11/03/19 1722 11/04/19 0400 11/04/19 1426 11/05/19 0629 11/06/19 0231  WBC 11.5* 11.7* 13.8* 9.5 9.8  NEUTROABS  --   --  10.1*  --   --   HGB 14.4 14.0 14.3 13.9 13.8  HCT 43.6 41.6 43.3 40.9 40.8  MCV 81.3 81.4 83.4 80.8 81.3  PLT 143* 150 102* 127* 141*    Basic Metabolic Panel: Recent Labs  Lab 11/03/19 1722 11/04/19 0400 11/04/19 1426 11/05/19 0629 11/06/19 0231  NA 141 141 137 139 139  K 4.1 4.8 4.2 4.0 3.7  CL 109 112* 107 108 108  CO2 21* 21* 18* 20* 23  GLUCOSE 121* 98 136* 103* 115*  BUN 20 21* 23* 17 14  CREATININE 1.85* 1.76* 1.93* 1.34* 1.27*  CALCIUM 8.6* 8.3* 7.9* 8.4* 8.5*  MG  --   --  2.1  --   --    GFR: Estimated Creatinine Clearance: 72.4 mL/min (A) (by C-G formula based on SCr of 1.27 mg/dL (H)). Recent Labs  Lab 11/04/19 0400 11/04/19 1414 11/04/19 1426 11/05/19 0629 11/06/19 0231  WBC 11.7*  --  13.8* 9.5 9.8  LATICACIDVEN  --  3.7*  --   --   --     Liver Function Tests: Recent Labs  Lab 11/04/19 1426  AST 46*  ALT 31  ALKPHOS 61  BILITOT 1.4*  PROT 6.3*  ALBUMIN 2.8*   No results for input(s): LIPASE, AMYLASE in the last 168 hours. No results for input(s): AMMONIA in the last 168 hours.  ABG No results found for: PHART, PCO2ART, PO2ART, HCO3, TCO2, ACIDBASEDEF, O2SAT   Coagulation Profile: Recent Labs  Lab 11/04/19 1414 11/05/19 0629  INR 2.0* 1.3*    Cardiac Enzymes: No results for input(s): CKTOTAL, CKMB, CKMBINDEX, TROPONINI in the last 168 hours.  HbA1C: Hgb A1c MFr Bld  Date/Time Value Ref Range Status  06/14/2016 04:42 PM 5.5 4.8 - 5.6  % Final    Comment:    (NOTE)         Pre-diabetes: 5.7 - 6.4         Diabetes: >6.4         Glycemic control for adults with diabetes: <7.0     CBG: No results for input(s): GLUCAP in the last 168 hours.  Review of Systems:   Negative except for what is noted on subjective.   Past Medical History  He,  has a past medical  history of DVT (deep venous thrombosis) ([place]), Hypertension (1997), and Tobacco dependence.   Surgical History    Past Surgical History:  Procedure Laterality Date  . KNEE SURGERY  approx. 1984   reconstructive surgery of left knee; arthroscopic procedure about 1997  . PERIPHERAL VASCULAR CATHETERIZATION Left 06/16/2016   Procedure: Lower Extremity Venography- Venus Thrombolysis;  Surgeon: Serafina Mitchell, MD;  Location: [facility];  Service: Cardiovascular;  Laterality: Left;     Social History   reports that he quit smoking about 2 years ago. His smoking use included cigarettes. He has never used smokeless tobacco. He reports current alcohol use of about 6.0 standard drinks of alcohol per week. He reports that he does not use drugs.   Family History   His family history includes CAD (age of onset: 82) in his mother.   Allergies No Known Allergies   Home Medications  Prior to Admission medications   Medication Sig Start Date End Date Taking? Authorizing Provider  amLODipine (NORVASC) 10 MG tablet Take 1 tablet (10 mg total) by mouth daily. 12/10/18   Langston Masker B, PA-C  apixaban (ELIQUIS) 5 MG TABS tablet 10 mg BID x 14 doses then 5 mg BID Patient not taking: Reported on 02/21/2017 06/17/16   Geradine Girt, DO  aspirin EC 325 MG tablet Take 1 tablet (325 mg total) by mouth daily. 06/17/16   Geradine Girt, DO    Dr. Jose Persia Internal Medicine PGY-1  11/06/2019, 6:45 AM    Pulmonary critical care attending:  59 year old gentleman admitted to the intensive care unit for shortness of breath progressive dyspnea.  Echocardiogram  revealed large clot in transit SVC/RA and RV clot involvement.  CT scan with submassive PE.  Decision was made for systemic thrombolysis with 100 mg of TPA.  During TPA infusion patient suffered brief cardiac arrest.  Obtained ROSC after short round of chest compressions.  Patient was requiring nonrebreather for oxygenation and subsequently over the following days was able to titrate down back to room air.  Over the past day patient now ambulatory.  Able to go to the restroom on his own.  Able to walk around the unit.  Working with heart failure pharmacy team to obtain anticoagulation medications for free if possible.  We appreciate their help with this.  BP (!) 152/90 (BP Location: Right Arm)   Pulse 94   Temp 97.6 F (36.4 C) (Oral)   Resp (!) 24   Ht 5\' 9"  (1.753 m)   Wt 93.4 kg   SpO2 96%   BMI 30.42 kg/m   General: Middle-aged male resting in bed comfortable Heart: Regular rhythm S1-S2 Lungs: Clear to auscultation bilaterally no crackles no wheeze.  Assessment: Massive PE status post cardiac arrest status post 100 mg TPA, initial clot call on echocardiogram in transit.  Plan: Patient will need anticoagulation for life as this is his second pulmonary embolism. Continue to work with outpatient pharmacy to help obtain medications. Patient transition to [place] today orally and plans for discharge with 30-day Xarelto prescription.  We will arrange follow-up in heart failure clinic as well as in clinic with me.  Garner Nash, DO [facility] 11/06/2019 5:11 PM

## 2019-11-06 NOTE — Progress Notes (Signed)
Ray for tPA>>heparin Indication: pulmonary embolus  Assessment: 29 yoM admitted with SOB found to have bilateral PE and atrial thrombus. Pt has hx VTE but has not taken any anticoagulants in months due to cost. Pt initially started on IV heparin, received IV tPA 4/18. Pharmacy asked to continue heparin.   Heparin level 0.32 units/ml  Goal of Therapy:  Heparin level 0.3-0.7 units/ml  Monitor platelets by anticoagulation protocol: Yes   Plan:  - Continue Heparin at 1900 units/hr (19 ml/hr) Daily heparin level and CBC Monitor for bleeding complications  Thanks for allowing pharmacy to be a part of this patient's care.  Excell Seltzer, PharmD Clinical Pharmacist    Discussed on rounds - stable for DC  Will give 1st dose rivaroxaban 15mg  x1 this am and turn off heparin Will DC home with rivaroxaban VTE starter pack 15mg  BID x21d then 20mg  daily Follow up with [facility] and HF clinic and plumonary office Patient educated   Bonnita Nasuti Pharm.D. CPP, BCPS Clinical Pharmacist [phone] 11/06/2019 11:02 AM

## 2019-11-06 NOTE — Discharge Summary (Signed)
Physician Discharge Summary         Patient ID: AMERY MINASYAN MRN: [id number] DOB/AGE: Aug 25, 1960 59 y.o.  Admit date: 11/03/2019 Discharge date: 11/06/2019  Discharge Diagnoses:    # High risk submassive PE with clot in transit, s/p systemic lytics # Chronic DVTs # Acute hypoxic respiratory failure # Hypertension # AKI on CKD Stage 3a # Thrombocytopenia   Discharge summary    Mr. 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 Last PE was approximately 3 years ago in 2017 and patient states that he stopped taking Eliquis approximately 9 months ago because he could not afford it. Received his second Covid-19 vaccination several days before noting progressive shortness of breath. No fevers, chills, coughing, URI symptoms, chest pain or leg pain and swelling. SOB bnoted on Tuesday. Progressed over the past few days with worsening DOE. No cough. No hemoptysis.   In the emergency department, patient was tachycardic in the 130s, tachypneic and oxygen saturations as low as 91% so he was placed on 3 L nasal cannula. He has not been hypotensive. BNP 637, high-sensitivity troponin 91, creatinine 1.8. CTA was obtained showing multiple large central and peripheral PEs with filling defects bilaterally and concern for right heartstrain therefore PCCM consulted.  Patient received 100 mg of TPA for echocardiogram that revealed clot in transit.  Patient suffered brief cardiac arrest following this with chest compressions only.  ROSC was obtained.  Patient was requiring nonrebreather for oxygenation but this quickly improved over the coming hours.  He was observed in the intensive care unit for an additional day following thrombolytics and initiation of heparin.  Patient tolerated this well titrated all the way off to room air.  Able to  ambulate.  Patient was transitioned to Xarelto with plans for discharge home.  Discharge Plan by Active Problems     # High risk massive PE with clot in transit, s/p systemic lytics, suffered brief cardiac arrest  # Chronic DVTs - Repeat TTE shows improved in RV function and lack of thrombus, but with continued dilation and elevated pulmonary pressure  - CT venogram did not show IVC thrombus - Continue to monitor for signs bleeding - Transitioned to Xarelto today   # Acute hypoxic respiratory failure - Resolved, discharged on room air   # Hypertension - Restart home Amlodipine   # AKI on CKD Stage 3a - Creatinine continues to improve at 1.27 today, CrCl 72.4 - Avoid nephrotoxic agents   # Thrombocytopenia  - Improving   Significant Hospital tests/ studies   Significant Hospital Events   4/17 admit for pulmonary embolism 4/18 Echo RV markedly dilated with severe RV dysfunction and large mobile clot extending from IVC into RA bobbing across TV. RVSP~5mmHG. 4/18 tPA administered 4/18 PEA arrest and subsequent ROSC 4/18 POCUS/Echo IVC/RA clot no longer present. Sats 100% on HFNC   Consults:  PCCM  Procedures:  4/18 tPA given  Significant Diagnostic Tests:   4/17 CTA chest>> Multiple large central and peripheral pulmonary artery filling defects bilaterally  4/18 Vas US>>  RIGHT:  - Findings appear essentially unchanged compared to previous examination.  - There is no evidence of deep vein thrombosis in the lower extremity.  LEFT:  - There is recanalized thrombus in the left popliteal vein(s).  - Findings consistent with chronic deep vein thrombosis involving the left  femoral vein, and  left popliteal vein.  - Findings appear essentially unchanged compared to previous examination.   4/18 Echo>> 1. 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 Consistent with thrombus in  transit.  2. RV  free wall akinesis with improved function at apex, consistent with  McConnell's sign as can be seen in acute PE. Right ventricular systolic  function is severely reduced. The right ventricular size is moderately  enlarged. There is moderately  elevated pulmonary artery systolic pressure. The estimated right  ventricular systolic pressure is 20.9 mmHg.  3. Left ventricular ejection fraction, by estimation, is 60 to 65%. The  left ventricle has normal function. The left ventricle has no regional  wall motion abnormalities. There is mild concentric left ventricular  hypertrophy. Left ventricular diastolic  parameters are indeterminate. There is the interventricular septum is  flattened in systole and diastole, consistent with right ventricular  pressure and volume overload.  4. Right atrial size was moderately dilated.  5. The mitral valve is normal in structure. No evidence of mitral valve  regurgitation.  6. The aortic valve is tricuspid. Aortic valve regurgitation is not  visualized. No aortic stenosis is present.  7. The inferior vena cava is dilated in size with <50% respiratory  variability, suggesting right atrial pressure of 15 mmHg.  4/19 CT venogram>> IMPRESSION: 1. IVC is patent. No evidence for IVC thrombus. 2. Re-demonstration of bilateral pulmonary emboli. 3. Left common iliac venous stent appears to be patent. 4. **An incidental finding of potential clinical significance has been found. Poorly defined hyperdense or enhancing area along the lateral left hepatic lobe. Suspect this represents a perfusion abnormality but difficult to exclude an underlying lesion in this area. Recommend follow-up MRI of the liver, with and without contrast, as an outpatient when the patient has recovered from the acute event and can adequately hold his breath for a diagnostic study.** 5. Colonic diverticulosis.  4/19 limited TTE >> 1. Left ventricular diastolic parameters are  consistent with Grade I  diastolic dysfunction (impaired relaxation).  2. RV base diameter 4.6 cm.   When compared to prior, RV function has improved. . Right ventricular  systolic function is moderately reduced. The right ventricular size is  moderately enlarged. There is moderately elevated pulmonary artery  systolic pressure. The estimated right  ventricular systolic pressure is 47.0 mmHg.  3. When compared to prior, RA thrombus is no longer present. . Right  atrial size was moderately dilated.  4. No evidence of mitral valve regurgitation.  5. Tricuspid valve regurgitation is moderate.  6. The inferior vena cava is normal in size with greater than 50%  respiratory variability, suggesting right atrial pressure of 3 mmHg.    Consults  Cardiology, PCCM    Discharge Exam: BP (!) 152/90 (BP Location: Right Arm)   Pulse 94   Temp 97.6 F (36.4 C) (Oral)   Resp (!) 24   Ht 5\' 9"  (1.753 m)   Wt 93.4 kg   SpO2 96%   BMI 30.42 kg/m  General: Middle-aged male resting in bed no apparent distress Heart: Regular rhythm S1-S2 Lungs: Clear to auscultation bilaterally no crackles no wheeze Abdomen: Soft nontender nondistended Extremities: No significant edema   Labs at discharge   Lab Results  Component Value Date   CREATININE 1.27 (H) 11/06/2019   BUN 14 11/06/2019   NA 139 11/06/2019   K 3.7 11/06/2019   CL 108 11/06/2019   CO2 23  11/06/2019   Lab Results  Component Value Date   WBC 9.8 11/06/2019   HGB 13.8 11/06/2019   HCT 40.8 11/06/2019   MCV 81.3 11/06/2019   PLT 141 (L) 11/06/2019   Lab Results  Component Value Date   ALT 31 11/04/2019   AST 46 (H) 11/04/2019   ALKPHOS 61 11/04/2019   BILITOT 1.4 (H) 11/04/2019   Lab Results  Component Value Date   INR 1.3 (H) 11/05/2019   INR 2.0 (H) 11/04/2019   INR 1.12 06/15/2016    Current radiological studies    CT VENOGRAM ABD/PEL  Result Date: 11/05/2019 CLINICAL DATA:  59 year old with pulmonary  emboli and right atrial thrombus on echocardiography. Evaluate IVC for patency and thrombus. EXAM: CT ABDOMEN AND PELVIS WITH CONTRAST (CT venogram protocol) TECHNIQUE: Multidetector CT imaging of the abdomen during the portal venous phase of contrast and images of the abdomen and pelvis during the delayed venous phase of contrast CONTRAST:  151mL OMNIPAQUE IOHEXOL 350 MG/ML SOLN COMPARISON:  Chest CT 11/03/2019 FINDINGS: Lower chest: Pulmonary emboli identified in the lower lobe pulmonary artery branches and compatible with recent chest CTA findings. Dependent densities in the left lower lobe likely represent atelectasis. Possible trace left pleural fluid. Hepatobiliary: Poorly defined hyperattenuation involving the lateral left hepatic lobe on sequence 3, image 22. No discrete lesion in this area on the delayed imaging. Portal venous system is patent. Normal appearance of the gallbladder. No significant biliary dilatation. Pancreas: Unremarkable. No pancreatic ductal dilatation or surrounding inflammatory changes. Spleen: Normal in size without focal abnormality. Adrenals/Urinary Tract: Low-density fullness of both adrenal glands may represent hyperplasia. Normal appearance of both kidneys without hydronephrosis. Normal appearance of the urinary bladder. Small low-density structures in both kidneys suggestive for small cortical cysts. Stomach/Bowel: Colonic diverticulosis without acute colonic inflammation. Evidence for normal gas-filled appendix. Normal appearance of the stomach. Vascular/Lymphatic: Normal caliber of the abdominal aorta with mild atherosclerotic disease. Main visceral arteries are patent. Common iliac arteries are tortuous. Portal venous system is patent. IVC is widely patent without thrombus. Bilateral renal veins are patent. There is a left common iliac venous stent that extends into the distal IVC. Stent appears to be patent. Right iliac venous system is patent. Hepatic veins appear to be  patent. Reproductive: Normal appearance of the prostate. Other: No ascites.  Negative for free air. Musculoskeletal: No acute bone abnormality. IMPRESSION: 1. IVC is patent.  No evidence for IVC thrombus. 2. Re-demonstration of bilateral pulmonary emboli. 3. Left common iliac venous stent appears to be patent. 4. **An incidental finding of potential clinical significance has been found. Poorly defined hyperdense or enhancing area along the lateral left hepatic lobe. Suspect this represents a perfusion abnormality but difficult to exclude an underlying lesion in this area. Recommend follow-up MRI of the liver, with and without contrast, as an outpatient when the patient has recovered from the acute event and can adequately hold his breath for a diagnostic study.** 5. Colonic diverticulosis. Electronically Signed   By: Markus Daft M.D.   On: 11/05/2019 15:13   ECHOCARDIOGRAM LIMITED  Result Date: 11/05/2019    ECHOCARDIOGRAM LIMITED REPORT   Patient Name:   LADARRIAN ASENCIO Date of Exam: 11/05/2019 Medical Rec #:  [id number]         Height:       69.0 in Accession #:    [id number]        Weight:       206.0 lb Date of Birth:  [birth year]  BSA:          2.092 m Patient Age:    68 years          BP:           151/96 mmHg Patient Gender: M                 HR:           96 bpm. Exam Location:  Inpatient Procedure: Limited Echo Indications:    Pulmonary Embolus 415.19 / I26.99  History:        Patient has prior history of Echocardiogram examinations, most                 recent 11/04/2019. Risk Factors:Hypertension and Former Smoker.                 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  Sonographer:    Leavy Cella Referring Phys: [id number] [place]  1. Left ventricular diastolic parameters are consistent with Grade I diastolic dysfunction (impaired relaxation).  2. RV base diameter 4.6 cm.     When compared to prior,  RV function has improved. . Right ventricular systolic function is moderately reduced. The right ventricular size is moderately enlarged. There is moderately elevated pulmonary artery systolic pressure. The estimated right ventricular systolic pressure is 26.9 mmHg.  3. When compared to prior, RA thrombus is no longer present. . Right atrial size was moderately dilated.  4. No evidence of mitral valve regurgitation.  5. Tricuspid valve regurgitation is moderate.  6. The inferior vena cava is normal in size with greater than 50% respiratory variability, suggesting right atrial pressure of 3 mmHg. FINDINGS  Right Ventricle: RV base diameter 4.6 cm. When compared to prior, RV function has improved. The right ventricular size is moderately enlarged. Right ventricular systolic function is moderately reduced. There is moderately elevated pulmonary artery systolic pressure. The tricuspid regurgitant velocity is 2.86 m/s, and with an assumed right atrial pressure of 10 mmHg, the estimated right ventricular systolic pressure is 48.5 mmHg. Right Atrium: When compared to prior, RA thrombus is no longer present. Right atrial size was moderately dilated. Tricuspid Valve: Tricuspid valve regurgitation is moderate. Venous: The inferior vena cava is normal in size with greater than 50% respiratory variability, suggesting right atrial pressure of 3 mmHg.   Diastology LV e' lateral:   12.80 cm/s LV E/e' lateral: 3.5 LV e' medial:    8.92 cm/s LV E/e' medial:  5.0  RIGHT VENTRICLE RV S prime:     13.90 cm/s TAPSE (M-mode): 1.7 cm RIGHT ATRIUM           Index RA Area:     19.70 cm RA Volume:   62.10 ml  29.68 ml/m  MITRAL VALVE               TRICUSPID VALVE MV Area (PHT): 2.03 cm    TR Peak grad:   32.7 mmHg MV Decel Time: 373 msec    TR Vmax:        286.00 cm/s MV E velocity: 44.60 cm/s MV A velocity: 54.80 cm/s MV E/A ratio:  0.81 Candee Furbish MD Electronically signed by Candee Furbish MD Signature Date/Time: 11/05/2019/2:17:13 PM     Final     Disposition:    Discharge disposition: 01-Home or Self Care  Discharge Instructions    (HEART FAILURE PATIENTS) Call MD:  Anytime you have any of the following symptoms: 1) 3 pound weight gain in 24 hours or 5 pounds in 1 week 2) shortness of breath, with or without a dry hacking cough 3) swelling in the hands, feet or stomach 4) if you have to sleep on extra pillows at night in order to breathe.   Complete by: As directed    Call MD for:  difficulty breathing, headache or visual disturbances   Complete by: As directed    Call MD for:  persistant dizziness or light-headedness   Complete by: As directed    Call MD for:  severe uncontrolled pain   Complete by: As directed    Call MD for:  temperature >100.4   Complete by: As directed    Diet - low sodium heart healthy   Complete by: As directed    Discharge instructions   Complete by: As directed    Mr. Davies,  You were admitted to the hospital due to blood clots in your lungs. This caused a strain on your heart and led to your heart stopping. After CPR, we were able to restart your heart. Additionally, we used a strong 'clot buster' to break up the clot in your heart. During this stay, we started you on blood thinners that will be important to continue everyday. The blood thinner you will be taking daily is called Xarelto.   Please make sure to follow up with your primary care provider, the lung doctors (Dr. Valeta Harms or Dr. Tamala Julian) and the heart doctors (Dr. Pierre Bali). The numbers to call to schedule an appointment is listed in your discharge paperwork.   It was a pleasure to take care of your during your admission.   Increase activity slowly   Complete by: As directed       Allergies as of 11/06/2019   No Known Allergies     Medication List    STOP taking these medications   apixaban 5 MG Tabs tablet Commonly known as: ELIQUIS   aspirin EC 325 MG tablet   meloxicam 15 MG tablet Commonly known as:  MOBIC     TAKE these medications   amLODipine 10 MG tablet Commonly known as: NORVASC Take 1 tablet (10 mg total) by mouth daily.   Rivaroxaban Stater Pack (15 mg and 20 mg) Commonly known as: XARELTO STARTER PACK Follow package directions: Take one 15mg  tablet by mouth twice a day. On day 22, switch to one 20mg  tablet once a day. Take with food.   rivaroxaban 20 MG Tabs tablet Commonly known as: XARELTO Take 1 tablet (20 mg total) by mouth daily with supper.        Follow-up appointment    Pierre Bali, [place], [facility]    Discharge Condition:    stable  Physician Statement:   The Patient was personally examined, the discharge assessment and plan has been personally reviewed and I agree with ACNP Babcock's assessment and plan. 31 minutes of time have been dedicated to discharge assessment, planning and discharge instructions.   Signed: Garner Nash 11/06/2019, 5:24 PM

## 2019-11-26 ENCOUNTER — Encounter (HOSPITAL_COMMUNITY): Payer: Self-pay

## 2019-11-26 ENCOUNTER — Ambulatory Visit (HOSPITAL_COMMUNITY)
Admission: RE | Admit: 2019-11-26 | Discharge: 2019-11-26 | Disposition: A | Discharge status: Home / Self Care | Payer: Medicaid Other | Source: Ambulatory Visit | Attending: Cardiology | Admitting: Cardiology

## 2019-11-26 ENCOUNTER — Telehealth (HOSPITAL_COMMUNITY): Payer: Self-pay | Admitting: Licensed Clinical Social Worker

## 2019-11-26 ENCOUNTER — Other Ambulatory Visit: Payer: Self-pay

## 2019-11-26 VITALS — BP 138/90 | HR 102 | Wt 211.0 lb

## 2019-11-26 DIAGNOSIS — Z8249 Family history of ischemic heart disease and other diseases of the circulatory system: Secondary | ICD-10-CM | POA: Diagnosis not present

## 2019-11-26 DIAGNOSIS — I2699 Other pulmonary embolism without acute cor pulmonale: Secondary | ICD-10-CM | POA: Diagnosis not present

## 2019-11-26 DIAGNOSIS — Z7901 Long term (current) use of anticoagulants: Secondary | ICD-10-CM | POA: Insufficient documentation

## 2019-11-26 DIAGNOSIS — Z87891 Personal history of nicotine dependence: Secondary | ICD-10-CM | POA: Insufficient documentation

## 2019-11-26 DIAGNOSIS — E669 Obesity, unspecified: Secondary | ICD-10-CM | POA: Insufficient documentation

## 2019-11-26 DIAGNOSIS — Z8674 Personal history of sudden cardiac arrest: Secondary | ICD-10-CM | POA: Insufficient documentation

## 2019-11-26 DIAGNOSIS — Z86718 Personal history of other venous thrombosis and embolism: Secondary | ICD-10-CM | POA: Insufficient documentation

## 2019-11-26 DIAGNOSIS — Z79899 Other long term (current) drug therapy: Secondary | ICD-10-CM | POA: Diagnosis not present

## 2019-11-26 DIAGNOSIS — I50810 Right heart failure, unspecified: Secondary | ICD-10-CM | POA: Diagnosis not present

## 2019-11-26 DIAGNOSIS — N183 Chronic kidney disease, stage 3 unspecified: Secondary | ICD-10-CM | POA: Diagnosis not present

## 2019-11-26 DIAGNOSIS — Z6831 Body mass index (BMI) 31.0-31.9, adult: Secondary | ICD-10-CM | POA: Insufficient documentation

## 2019-11-26 DIAGNOSIS — I2609 Other pulmonary embolism with acute cor pulmonale: Secondary | ICD-10-CM

## 2019-11-26 DIAGNOSIS — I130 Hypertensive heart and chronic kidney disease with heart failure and stage 1 through stage 4 chronic kidney disease, or unspecified chronic kidney disease: Secondary | ICD-10-CM | POA: Insufficient documentation

## 2019-11-26 MED ORDER — METOPROLOL SUCCINATE ER 25 MG PO TB24: 12.5000 mg | ORAL_TABLET | Freq: Every day | ORAL | 11 refills | Status: DC

## 2019-11-26 NOTE — Addendum Note (Signed)
Encounter addended by: Valeda Malm, RN on: 11/26/2019 1:23 PM  Actions taken: Pharmacy for encounter modified, Order list changed

## 2019-11-26 NOTE — Telephone Encounter (Signed)
CSW sent in [place] [employer] [place] application for Xarelto assistance for review- fax confirmation received- awaiting determination  Jorge Ny, [facility] Desk#: [phone] Cell#: [phone]

## 2019-11-26 NOTE — Progress Notes (Signed)
Advanced Heart Failure Clinic Note   Referring Physician: PCP: Patient, No Pcp Per PCP-Cardiologist: Dr. Haroldine Laws   Reason for Visit: [facility] F/u for PE w/ RV Strain   HPI: Mr. Asbridge is a 59 year old obese male with a past medical history of DVT/PE, hypertension, stage 3 CKD, Covid-19 infection in 05/20/2019 and tobacco abuse, recently admitted for recurrent pulmonary embolism.   He had his first PE approximately 3 years ago in 2017 and he had stopped taking Eliquis approximately 9 months ago because he could not afford it. Received his second Covid-19 vaccination several days before noting progressive shortness of breath. Presented to ER 4/17. On arrival was tachycardic in the 130s, tachypneic and oxygen saturations as low as 91% so he was placed on 3 L nasal cannula. No hypotension. BNP 637, high-sensitivity troponin 91, creatinine 1.8. CTA was obtained showing multiple large central and peripheral PEs with filling defects bilaterally and RV strain. CCM consulted and required intubation.   Echo showed LVEF 65% with compressed LV. RV markedly dilated with severe RV dysfunction and large mobile clot extending from IVC into RA bobbing across TV. RVSP ~6mmHG  Patient received T-PA. About 2.5 hours into 3 hour T-PA infusion, patient developed PEA arrest and was resuscitated in 30 seconds. He was considered for ECMO but not felt indicated.  He improved quickly and w/ improved oxygenation and successfully extubated. Echo repeated and showed resolution of clot and RV strain improved, RV systolic function moderately reduced. He was transitioned off IV heparin to Xarelto.  He presents to clinic for f/u. Doing well. Denies resting/exertional dyspnea. No chest pain. O2 sats 98% on RA. No LEE or leg pain. Reports full compliance w/ Xarelto, currently on 15 mg bid dosing x 21 days. Was given starter pack and will transition to 20 mg once daily after 3 weeks for bid dosing. He has f/u w/  pulmonology in June. He denies abnormal bleeding w/ Xarelto.   BP mildly elevated today. EKG shows sinus tach 101 bpm, w/ PVCs. Has f/u w/ his PCP on 5/21.     Review of Systems: [y] = yes, [ ]  = no   General: Weight gain [ ] ; Weight loss [ ] ; Anorexia [ ] ; Fatigue [ ] ; Fever [ ] ; Chills [ ] ; Weakness [ ]   Cardiac: Chest pain/pressure [ ] ; Resting SOB [ ] ; Exertional SOB [ ] ; Orthopnea [ ] ; Pedal Edema [ ] ; Palpitations [ ] ; Syncope [ ] ; Presyncope [ ] ; Paroxysmal nocturnal dyspnea[ ]   Pulmonary: Cough [ ] ; Wheezing[ ] ; Hemoptysis[ ] ; Sputum [ ] ; Snoring [ ]   GI: Vomiting[ ] ; Dysphagia[ ] ; Melena[ ] ; Hematochezia [ ] ; Heartburn[ ] ; Abdominal pain [ ] ; Constipation [ ] ; Diarrhea [ ] ; BRBPR [ ]   GU: Hematuria[ ] ; Dysuria [ ] ; Nocturia[ ]   Vascular: Pain in legs with walking [ ] ; Pain in feet with lying flat [ ] ; Non-healing sores [ ] ; Stroke [ ] ; TIA [ ] ; Slurred speech [ ] ;  Neuro: Headaches[ ] ; Vertigo[ ] ; Seizures[ ] ; Paresthesias[ ] ;Blurred vision [ ] ; Diplopia [ ] ; Vision changes [ ]   Ortho/Skin: Arthritis [ ] ; Joint pain [ ] ; Muscle pain [ ] ; Joint swelling [ ] ; Back Pain [ ] ; Rash [ ]   Psych: Depression[ ] ; Anxiety[ ]   Heme: Bleeding problems [ ] ; Clotting disorders [ ] ; Anemia [ ]   Endocrine: Diabetes [ ] ; Thyroid dysfunction[ ]    Past Medical History:  Diagnosis Date  . DVT (deep venous thrombosis) ([place])   . Hypertension 1997  has never taken medications  . Tobacco dependence     Current Outpatient Medications  Medication Sig Dispense Refill  . amLODipine (NORVASC) 10 MG tablet Take 1 tablet (10 mg total) by mouth daily. 30 tablet 1  . rivaroxaban (XARELTO) 20 MG TABS tablet Take 1 tablet (20 mg total) by mouth daily with supper. 30 tablet 1  . metoprolol succinate (TOPROL XL) 25 MG 24 hr tablet Take 0.5 tablets (12.5 mg total) by mouth daily. 15 tablet 11  . RIVAROXABAN (XARELTO) VTE STARTER PACK (15 & 20 MG TABLETS) Follow package directions: Take one 15mg  tablet by mouth  twice a day. On day 22, switch to one 20mg  tablet once a day. Take with food. 51 each 0   No current facility-administered medications for this encounter.    No Known Allergies    Social History   Socioeconomic History  . Marital status: Married    Spouse name: Not on file  . Number of children: Not on file  . Years of education: Not on file  . Highest education level: Not on file  Occupational History  . Occupation: Architect  Tobacco Use  . Smoking status: Former Smoker    Types: Cigarettes    Quit date: 11/21/2016    Years since quitting: 3.0  . Smokeless tobacco: Never Used  Substance and Sexual Activity  . Alcohol use: Yes    Alcohol/week: 6.0 standard drinks    Types: 6 Cans of beer per week  . Drug use: No  . Sexual activity: Not on file  Other Topics Concern  . Not on file  Social History Narrative  . Not on file   Social Determinants of Health   Financial Resource Strain:   . Difficulty of Paying Living Expenses:   Food Insecurity:   . Worried About Charity fundraiser in the Last Year:   . Arboriculturist in the Last Year:   Transportation Needs: No Transportation Needs  . Lack of Transportation (Medical): No  . Lack of Transportation (Non-Medical): No  Physical Activity: Inactive  . Days of Exercise per Week: 0 days  . Minutes of Exercise per Session: 0 min  Stress:   . Feeling of Stress :   Social Connections:   . Frequency of Communication with Friends and Family:   . Frequency of Social Gatherings with Friends and Family:   . Attends Religious Services:   . Active Member of Clubs or Organizations:   . Attends Archivist Meetings:   [name] Marital Status:   Intimate Partner Violence:   . Fear of Current or Ex-Partner:   . Emotionally Abused:   [name] Physically Abused:   . Sexually Abused:       Family History  Problem Relation Age of Onset  . CAD Mother 23    Vitals:   11/26/19 1002  BP: 138/90  Pulse: (!) 102  SpO2: 98%    Weight: 95.7 kg     PHYSICAL EXAM: General:  Well appearing. No respiratory difficulty HEENT: normal Neck: supple. no JVD. Carotids 2+ bilat; no bruits. No lymphadenopathy or thyromegaly appreciated. Cor: PMI nondisplaced. Regular rhythm, tachy rate. No rubs, gallops or murmurs. Lungs: clear Abdomen: soft, nontender, nondistended. No hepatosplenomegaly. No bruits or masses. Good bowel sounds. Extremities: no cyanosis, clubbing, rash, edema Neuro: alert & oriented x 3, cranial nerves grossly intact. moves all 4 extremities w/o difficulty. Affect pleasant.  ECG: sinus tachycardia 101 bpm, PVCs    ASSESSMENT & PLAN:  1. Submassive PE with severe RV strain - previous unprovoked clot in 2017. Had been off A/c x 9 months due to cost - recent COVID illness (11/20)  - Recent admit 4/21 for recurrent PE/ acute hypoxic respiratory failure w/ subsequent RV strain and PEA arrest.   - CT showed submassive bilateral PE with RV strain - initial echo LVEF 60-65% with severe RV dilation and dysfunction + clot in transit - s/p 100mg  systemic T-PA 4/18 - repeat echo showed resolution of clot in transit. RV function improved, severe>>moderate  - back on NOAC, Xarelto 15 mg bid x 21 days>>20 mg daily. He reports full compliance - O2 sats stable at 98% on RA - keep pulmonology f/u 6/21 - will need to make sure no residual CTEPH. Will arrange V/Q scan in 3 months  - repeat echo in 3 months to reassess RV  - he will need lifelong a/c given recurrent, unprovoked PE   2. RV Failure - per above, in the setting of PE, moderately reduced by TTE. LVEF normal.  - no signs of fluid overload, O2 sats stable on RA - will plan repeat echo in 3 months to reassess RV function. PASP was also elevated on echo. Will also need V/Q scan to r/o CTEPH. Will order study 3 months after a/c therapy w/ Xarelto   3. HTN - BP mildly elevated and also tachycardic, w/ HR low 100s - continue amlodipine 10 mg daily  - add  low dose Toprol XL 12.5 mg daily  - he has f/u w/ his PCP next week, can further adjust meds if needed.   F/u w/ Dr. Haroldine Laws in 4 months, after repeat echo and V/Q scan   Lyda Jester, PA-C 11/26/19

## 2019-11-26 NOTE — Patient Instructions (Addendum)
START Toprol XL 12.5 mg, one half tab daily  Your physician has requested that you have an echocardiogram. Echocardiography is a painless test that uses sound waves to create images of your heart. It provides your doctor with information about the size and shape of your heart and how well your heart's chambers and valves are working. This procedure takes approximately one hour. There are no restrictions for this procedure. In 3 months  Your physician has requested a VQ Scan  In 3 months   Be sure to keep your follow up with PCP on 12/07/19  Your physician recommends that you schedule a follow-up appointment in: 4 months with Dr Aundra Dubin  Do the following things EVERYDAY: 1) Weigh yourself in the morning before breakfast. Write it down and keep it in a log. 2) Take your medicines as prescribed 3) Eat low salt foods--Limit salt (sodium) to 2000 mg per day.  4) Stay as active as you can everyday 5) Limit all fluids for the day to less than 2 liters  At the [facility], you and your health needs are our priority. As part of our continuing mission to provide you with exceptional heart care, we have created designated Provider Care Teams. These Care Teams include your primary Cardiologist (physician) and Advanced Practice Providers (APPs- Physician Assistants and Nurse Practitioners) who all work together to provide you with the care you need, when you need it.   You may see any of the following providers on your designated Care Team at your next follow up: [name] Dr Glori Bickers . Dr Loralie Champagne . Darrick Grinder, NP . Lyda Jester, PA . Audry Riles, PharmD   Please be sure to bring in all your medications bottles to every appointment.

## 2019-11-26 NOTE — Progress Notes (Signed)
CSW met with pt to check in regarding lack of insurance and medication cost concerns.  Pt confirms he is uninsured at this time- was supposed to be getting insurance through his job staring 6/1 but per patients the physicians have told him he will likely not be able to return to work.  Pt states that he met with financial counseling last week and filled out Medicaid and disability paperwork- CSW confirmed through accounting notes that they are involved in helping pt apply for coverage.  Pt was sent home with a starter pack for Xarelto but is unsure how he will pay for it once he runs out of medication.  CSW helped pt fill out Wynetta Emery and [place] patient assistance form for Xarelto assistance- left MD portion for completion and will turn in once done.  Pt reports he has about 1.5 weeks of Xarelto remaining so CSW provided with 1 weeks worth of samples to hold him over until we hear back regarding Xarelto assistance  CSW will continue to follow and assist as needed  Jorge Ny, [facility] Desk#: [phone] Cell#: [phone]

## 2019-12-07 ENCOUNTER — Encounter: Payer: Self-pay | Admitting: Internal Medicine

## 2019-12-07 ENCOUNTER — Ambulatory Visit: Payer: Self-pay | Attending: Internal Medicine | Admitting: Internal Medicine

## 2019-12-07 ENCOUNTER — Other Ambulatory Visit: Payer: Self-pay

## 2019-12-07 ENCOUNTER — Other Ambulatory Visit: Payer: Self-pay | Admitting: Internal Medicine

## 2019-12-07 VITALS — BP 121/91 | HR 100 | Temp 97.9°F | Ht 69.0 in | Wt 215.6 lb

## 2019-12-07 DIAGNOSIS — I50810 Right heart failure, unspecified: Secondary | ICD-10-CM | POA: Insufficient documentation

## 2019-12-07 DIAGNOSIS — N289 Disorder of kidney and ureter, unspecified: Secondary | ICD-10-CM

## 2019-12-07 DIAGNOSIS — I2699 Other pulmonary embolism without acute cor pulmonale: Secondary | ICD-10-CM

## 2019-12-07 DIAGNOSIS — I10 Essential (primary) hypertension: Secondary | ICD-10-CM

## 2019-12-07 DIAGNOSIS — Z09 Encounter for follow-up examination after completed treatment for conditions other than malignant neoplasm: Secondary | ICD-10-CM

## 2019-12-07 DIAGNOSIS — D696 Thrombocytopenia, unspecified: Secondary | ICD-10-CM

## 2019-12-07 DIAGNOSIS — F17200 Nicotine dependence, unspecified, uncomplicated: Secondary | ICD-10-CM

## 2019-12-07 MED ORDER — RIVAROXABAN 20 MG PO TABS: 20.0000 mg | ORAL_TABLET | Freq: Every day | ORAL | 1 refills | Status: DC

## 2019-12-07 MED ORDER — METOPROLOL SUCCINATE ER 25 MG PO TB24: 12.5000 mg | ORAL_TABLET | Freq: Every day | ORAL | 4 refills | Status: DC

## 2019-12-07 MED ORDER — AMLODIPINE BESYLATE 10 MG PO TABS: 10.0000 mg | ORAL_TABLET | Freq: Every day | ORAL | 4 refills | Status: DC

## 2019-12-07 MED FILL — AMLODIPINE BESYLATE 10 MG T: 10 | 30 days supply | Qty: 30 | Fill #0

## 2019-12-07 NOTE — Progress Notes (Signed)
Patient ID: Mitchell Cooper, male    DOB: 10-22-1960  MRN: [id number]  CC: Hospital follow-up and new patient visit  Subjective: Mitchell Cooper is a 59 y.o. male who presents for new patient visit.  PCP previously was at [facility] His concerns today include:  Patient with history of HTN, tobacco dependence, CKD 3, COVID-19 infection 05/2019, DVT/PE 2017.  Patient hospitalized 4/17-20/2021 with high risk submassive PE.  Patient has previous history of massive DVT/PE in 2017 for which he was on Eliquis but had discontinued taking 9 months prior because he could no longer afford.  He presented to the emergency room with shortness of breath, tachycardia and hypoxia.  CTA revealed multiple large central and peripheral PEs with filling defects bilaterally with concern for right heart strain.  Patient given thrombolytics.  He had brief cardiac arrest of PEA following thrombolytics. -Repeat TEE showed moderately reduced systolic RV function and lack of thrombus but continued elevated pulmonary pressures.  Patient transition to [place].  Hypoxia resolved.  He developed thrombocytopenia that was improving by the time of discharge.  He was started on amlodipine for blood pressure.  Today: Since discharge she is followed up with cardiology PA earlier this month.  Plan is for VQ scan in 3 months to check for CTEPH and also repeat echo in 3 months to reassess the RV function.  Toprol all 12.5 mg was added for better blood pressure control.  He will follow-up with them again in 4 months.  He has an appointment coming up with the pulmonologist next month.  Since hospitalization he reports that his breathing is much better.  He has not been doing a whole lot.  He is taking the Xarelto.  Denies any bruising or bleeding on Xarelto.  He is wearing compression socks.  Denies any lower extremity edema.  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  He has not  been released to return to work and is wondering when he would be able to. -Trying to quit smoking.  Down to 1 cigarette/day.  Previously 1 pack would last a week. Reports compliance with taking Norvasc and Toprol.  However he has been out of the Norvasc for a few days. 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   No current facility-administered medications on file prior to visit.    No Known Allergies  Social History   Socioeconomic History   Marital status: Married    Spouse name: Not on file   Number of children: Not on file   Years of education: Not on file   Highest education level: Not on file  Occupational History   Occupation: Architect  Tobacco Use   Smoking status: Former Smoker    Types: Cigarettes    Quit date: 11/21/2016    Years since quitting: 3.0   Smokeless tobacco: Never Used  Substance and Sexual Activity   Alcohol use: Yes    Alcohol/week: 6.0 standard drinks    Types:  6 Cans of beer per week   Drug use: No   Sexual activity: Not on file  Other Topics Concern   Not on file  Social History Narrative   Not on file   Social Determinants of Health   Financial Resource Strain:    Difficulty of Paying Living Expenses:   Food Insecurity:    Worried About [place] in the Last Year:    Arboriculturist in the Last Year:   Transportation Needs: No Transportation Needs   Lack of Transportation (Medical): No   Lack of Transportation  (Non-Medical): No  Physical Activity: Inactive   Days of Exercise per Week: 0 days   Minutes of Exercise per Session: 0 min  Stress:    Feeling of Stress :   Social Connections:    Frequency of Communication with Friends and Family:    Frequency of Social Gatherings with Friends and Family:    Attends Religious Services:    Active Member of Clubs or Organizations:    Attends Music therapist:    Marital Status:   Intimate Partner Violence:    Fear of Current or Ex-Partner:    Emotionally Abused:    Physically Abused:    Sexually Abused:     Family History  Problem Relation Age of Onset   CAD Mother 57    Past Surgical History:  Procedure Laterality Date   KNEE SURGERY  approx. 1984   reconstructive surgery of left knee; arthroscopic procedure about [place] Left 06/16/2016   Procedure: Lower Extremity Venography- Venus Thrombolysis;  Surgeon: Serafina Mitchell, MD;  Location: [facility];  Service: Cardiovascular;  Laterality: Left;    ROS: Review of Systems Negative except as stated above  PHYSICAL EXAM: BP (!) 121/91    Pulse 100    Temp 97.9 F (36.6 C) (Temporal)    Ht 5\' 9"  (1.753 m)    Wt 215 lb 9.6 oz (97.8 kg)    SpO2 97%    BMI 31.84 kg/m   Physical Exam  General appearance - alert, well appearing, older African-American male and in no distress Mental status - normal mood, behavior, speech, dress, motor activity, and thought processes Eyes - pupils equal and reactive, extraocular eye movements intact Nose - normal and patent, no erythema, discharge or polyps Mouth - mucous membranes moist, pharynx normal without lesions Neck - supple, no significant adenopathy Chest - clear to auscultation, no wheezes, rales or rhonchi, symmetric air entry Heart - normal rate, regular rhythm, normal S1, S2, no murmurs, rubs, clicks or gallops Extremities - peripheral pulses normal, no pedal edema, no  clubbing or cyanosis  CMP Latest Ref Rng & Units 11/06/2019 11/05/2019 11/04/2019  Glucose 70 - 99 mg/dL 115(H) 103(H) 136(H)  BUN 6 - 20 mg/dL 14 17 23(H)  Creatinine 0.61 - 1.24 mg/dL 1.27(H) 1.34(H) 1.93(H)  Sodium 135 - 145 mmol/L 139 139 137  Potassium 3.5 - 5.1 mmol/L 3.7 4.0 4.2  Chloride 98 - 111 mmol/L 108 108 107  CO2 22 - 32 mmol/L 23 20(L) 18(L)  Calcium 8.9 - 10.3 mg/dL 8.5(L) 8.4(L) 7.9(L)  Total Protein 6.5 - 8.1 g/dL - - 6.3(L)  Total Bilirubin 0.3 - 1.2 mg/dL - - 1.4(H)  Alkaline Phos 38 - 126 U/L - - 61  AST 15 - 41 U/L - - 46(H)  ALT 0 - 44 U/L - - 31     Chemistry  Component Value Date/Time   NA 139 11/06/2019 0231   K 3.7 11/06/2019 0231   CL 108 11/06/2019 0231   CO2 23 11/06/2019 0231   BUN 14 11/06/2019 0231   CREATININE 1.27 (H) 11/06/2019 0231      Component Value Date/Time   CALCIUM 8.5 (L) 11/06/2019 0231   ALKPHOS 61 11/04/2019 1426   AST 46 (H) 11/04/2019 1426   ALT 31 11/04/2019 1426   BILITOT 1.4 (H) 11/04/2019 1426      CBC    Component Value Date/Time   WBC 9.8 11/06/2019 0231   RBC 5.02 11/06/2019 0231   HGB 13.8 11/06/2019 0231   HCT 40.8 11/06/2019 0231   PLT 141 (L) 11/06/2019 0231   MCV 81.3 11/06/2019 0231   MCH 27.5 11/06/2019 0231   MCHC 33.8 11/06/2019 0231   RDW 14.7 11/06/2019 0231   LYMPHSABS 2.0 11/04/2019 1426   MONOABS 1.4 (H) 11/04/2019 1426   EOSABS 0.1 11/04/2019 1426   BASOSABS 0.1 11/04/2019 1426    ASSESSMENT AND PLAN: 1. Hospital discharge follow-up 2. 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  I told him that we can issue to send him back to June 1 or we can wait until after he sees the pulmonologist later in June.  I will send a message to the  cardiologist to make sure they are okay with this to. - rivaroxaban (XARELTO) 20 MG TABS tablet; Take 1 tablet (20 mg total) by mouth daily with supper.  Dispense: 30 tablet; Refill: 1  3. Essential hypertension Not at goal.  Out of amlodipine for a few days.  Refills given DASH diet discussed and encouraged - amLODipine (NORVASC) 10 MG tablet; Take 1 tablet (10 mg total) by mouth daily.  Dispense: 30 tablet; Refill: 4 - metoprolol succinate (TOPROL XL) 25 MG 24 hr tablet; Take 0.5 tablets (12.5 mg total) by mouth daily.  Dispense: 15 tablet; Refill: 4 - Basic Metabolic Panel; Future - CBC; Future  4. Tobacco dependence Advised to quit.  Discussed health risks associated with smoking.  He is working on quitting.  He declines medications to help him quit.  Have encouraged him to set a quit date  5. RVF (right ventricular failure) ([place]) See #2 above  6. Renal insufficiency Plan to recheck BMP.  Avoid NSAIDs  7. Thrombocytopenia ([place]) We will recheck this today    Patient was given the opportunity to ask questions.  Patient verbalized understanding of the plan and was able to repeat key elements of the plan.   Orders Placed This Encounter  Procedures   Basic Metabolic Panel   CBC     Requested Prescriptions   Signed Prescriptions Disp Refills   amLODipine (NORVASC) 10 MG tablet 30 tablet 4    Sig: Take 1 tablet (10 mg total) by mouth daily.   metoprolol succinate (TOPROL XL) 25 MG 24 hr tablet 15 tablet 4    Sig: Take 0.5 tablets (12.5 mg total) by mouth daily.   rivaroxaban (XARELTO) 20 MG TABS tablet 30 tablet 1    Sig: Take 1 tablet (20 mg total) by mouth daily with supper.    Return in about 2 months (around 02/06/2020).  Karle Plumber, MD, FACP

## 2019-12-07 NOTE — Progress Notes (Signed)
HFU 

## 2019-12-10 ENCOUNTER — Telehealth (HOSPITAL_COMMUNITY): Payer: Self-pay | Admitting: Pharmacy Technician

## 2019-12-10 ENCOUNTER — Other Ambulatory Visit: Payer: Self-pay

## 2019-12-10 ENCOUNTER — Ambulatory Visit: Payer: Self-pay | Attending: Internal Medicine

## 2019-12-10 DIAGNOSIS — I10 Essential (primary) hypertension: Secondary | ICD-10-CM

## 2019-12-10 NOTE — Telephone Encounter (Signed)
Advanced Heart Failure Patient Advocate Encounter   Patient was approved to receive Xarelto from [facility]  Patient ID: [id number] Effective dates: 12/10/19 through 12/09/20  Pharmacy Billing [place]: [id number]   Group: [id number]  ID: [id number]   Shared billing information with pharmacy.  Charlann Boxer, CPhT

## 2019-12-11 LAB — BASIC METABOLIC PANEL
BUN/Creatinine Ratio: 16 (ref 9–20)
BUN: 17 mg/dL (ref 6–24)
CO2: 21 mmol/L (ref 20–29)
Calcium: 9.3 mg/dL (ref 8.7–10.2)
Chloride: 104 mmol/L (ref 96–106)
Creatinine, Ser: 1.07 mg/dL (ref 0.76–1.27)
GFR calc Af Amer: 89 mL/min/{1.73_m2} (ref >59)
GFR calc non Af Amer: 77 mL/min/{1.73_m2} (ref >59)
Glucose: 99 mg/dL (ref 65–99)
Potassium: 4.1 mmol/L (ref 3.5–5.2)
Sodium: 139 mmol/L (ref 134–144)

## 2019-12-11 LAB — CBC
Hematocrit: 44.5 % (ref 37.5–51.0)
Hemoglobin: 14.9 g/dL (ref 13.0–17.7)
MCH: 26.8 pg (ref 26.6–33.0)
MCHC: 33.5 g/dL (ref 31.5–35.7)
MCV: 80 fL (ref 79–97)
Platelets: 209 10*3/uL (ref 150–450)
RBC: 5.55 x10E6/uL (ref 4.14–5.80)
RDW: 15.9 % — ABNORMAL HIGH (ref 11.6–15.4)
WBC: 7.6 10*3/uL (ref 3.4–10.8)

## 2019-12-20 MED FILL — XARELTO 20 MG TABLET: 20 | 30 days supply | Qty: 30 | Fill #0

## 2019-12-31 ENCOUNTER — Inpatient Hospital Stay: Payer: Self-pay | Admitting: Internal Medicine

## 2019-12-31 NOTE — Progress Notes (Deleted)
No show

## 2020-01-03 ENCOUNTER — Other Ambulatory Visit: Payer: Self-pay | Admitting: *Deleted

## 2020-01-03 DIAGNOSIS — I2699 Other pulmonary embolism without acute cor pulmonale: Secondary | ICD-10-CM

## 2020-01-03 DIAGNOSIS — I871 Compression of vein: Secondary | ICD-10-CM

## 2020-01-07 ENCOUNTER — Ambulatory Visit (INDEPENDENT_AMBULATORY_CARE_PROVIDER_SITE_OTHER): Payer: Self-pay | Admitting: Pulmonary Disease

## 2020-01-07 ENCOUNTER — Encounter: Payer: Self-pay | Admitting: Pulmonary Disease

## 2020-01-07 ENCOUNTER — Other Ambulatory Visit: Payer: Self-pay

## 2020-01-07 DIAGNOSIS — I2699 Other pulmonary embolism without acute cor pulmonale: Secondary | ICD-10-CM

## 2020-01-07 NOTE — Progress Notes (Signed)
Virtual Visit via Telephone Note  I connected with Mitchell Cooper on 01/07/20 at  4:00 PM EDT by telephone and verified that I am speaking with the correct person using two identifiers.  Location: Patient: Home Provider: [facility] - [address], [place], [place], [place] [address]   I discussed the limitations, risks, security and privacy concerns of performing an evaluation and management service by telephone and the availability of in person appointments. I also discussed with the patient that there may be a patient responsible charge related to this service. The patient expressed understanding and agreed to proceed.  Patient consented to consult via telephone: Yes People present and their role in pt care: Pt     History of Present Illness:  59 year old male former smoker consulted with our practice in April/2021 for acute PE  PMH: Chronic kidney disease, history of PE, May Thurner syndrome, acute PE, RVEF, thrombocytopenia Smoking history: Former smoker Maintenance: None Patient of Dr. Valeta Harms   Chief complaint:    59 year old male former smoker initially consulted with our practice in April/2021 for an acute PE.  Patient had previously had a pulmonary embolism prior to this and stopped taking his DOAC about 9 months before the event in April/2021 due to high cost.  An excerpt of the discharge summary is listed below:  Admit date: 11/03/2019 Discharge date: 11/06/2019  Discharge Diagnoses:    #High risk submassive PE with clot in transit, s/p systemic lytics # Chronic DVTs # Acute hypoxic respiratory failure # Hypertension # AKI on CKD Stage 3a # Thrombocytopenia   Discharge summary    Mitchell Cooper is a 59 year old male past medical history of DVT/PE not on anticoagulation, hypertension, stage 3 CKD, Covid-19 and 05/20/2019 and tobacco abuse who presented with 4 days of progressive dyspnea. Last PE was approximately 3 years ago in 2017 and  patient states that he stopped taking Eliquis approximately 9 months ago because he could not afford it. Received his second Covid-19 vaccination several days before noting progressive shortness of breath. No fevers, chills, coughing, URI symptoms, chest pain or leg pain and swelling. SOB bnoted on Tuesday. Progressed over the past few days with worsening DOE. No cough. No hemoptysis.   In the emergency department, patient was tachycardic in the 130s, tachypneic and oxygen saturations as low as 91% so he was placed on 3 L nasal cannula. He has not been hypotensive. BNP 637, high-sensitivity troponin 91, creatinine 1.8. CTA was obtained showing multiple large central and peripheral PEs with filling defects bilaterally and concern for right heartstrain therefore PCCM consulted.  Patient received 100 mg of TPA for echocardiogram that revealed clot in transit.  Patient suffered brief cardiac arrest following this with chest compressions only.  ROSC was obtained.  Patient was requiring nonrebreather for oxygenation but this quickly improved over the coming hours.  He was observed in the intensive care unit for an additional day following thrombolytics and initiation of heparin.  Patient tolerated this well titrated all the way off to room air.  Able to ambulate.  Patient was transitioned to Xarelto with plans for discharge home.  Discharge Plan by Active Problems     #High risk massive PE with clot in transit, s/p systemic lytics, suffered brief cardiac arrest  # Chronic DVTs - Repeat TTE shows improved in RV functionand lack of thrombus, butwith continued dilation and elevated pulmonary pressure  - CT venogram did not show IVC thrombus - Continue to monitor for signs bleeding -  Transitioned toXarelto today   # Acute hypoxic respiratory failure -Resolved, discharged on room air   # Hypertension -Restart home Amlodipine  # AKI on CKD Stage 3a - Creatinine continues to improve at  1.27 today, CrCl 72.4 - Avoid nephrotoxic agents   # Thrombocytopenia  - Improving  Patient reports is doing well since discharge.  Still having some occasional shortness of breath.  Overall though he feels that he is clinically improved.  He had some initial chest soreness since being discharged.  Patient is interested in exercising more.  He would like to talk about this today.  Observations/Objective:  Significant Hospital Events   4/17 admit for pulmonary embolism 4/18 Echo RV markedly dilated with severe RV dysfunction and large mobile clot extending from IVC into RA bobbing across TV. RVSP~39mmHG. 4/18 tPA administered 4/18 PEA arrest and subsequent ROSC 4/18 POCUS/Echo IVC/RA clot no longer present. Sats 100% on HFNC  Consults:  PCCM  Procedures:  4/18 tPA given  Significant Diagnostic Tests:   4/17 CTA chest>> Multiple large central and peripheral pulmonary artery filling defects bilaterally  4/18 Vas US>> RIGHT:  - Findings appear essentially unchanged compared to previous examination.  - There is no evidence of deep vein thrombosis in the lower extremity.  LEFT:  - There is recanalized thrombus in the left popliteal vein(s).  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  - Findings appear essentially unchanged compared to previous examination.   4/18 Echo>> 1. 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 Consistent with thrombus in  transit.  2. RV free wall akinesis with improved function at apex, consistent with  McConnell's sign as can be seen in acute PE. Right ventricular systolic  function is severely reduced. The right ventricular size is moderately  enlarged. There is moderately  elevated pulmonary artery systolic pressure. The estimated right  ventricular systolic pressure is 66.4 mmHg.  3. Left  ventricular ejection fraction, by estimation, is 60 to 65%. The  left ventricle has normal function. The left ventricle has no regional  wall motion abnormalities. There is mild concentric left ventricular  hypertrophy. Left ventricular diastolic  parameters are indeterminate. There is the interventricular septum is  flattened in systole and diastole, consistent with right ventricular  pressure and volume overload.  4. Right atrial size was moderately dilated.  5. The mitral valve is normal in structure. No evidence of mitral valve  regurgitation.  6. The aortic valve is tricuspid. Aortic valve regurgitation is not  visualized. No aortic stenosis is present.  7. The inferior vena cava is dilated in size with <50% respiratory  variability, suggesting right atrial pressure of 15 mmHg.  4/19 CT venogram>> IMPRESSION: 1. IVC is patent. No evidence for IVC thrombus. 2. Re-demonstration of bilateral pulmonary emboli. 3. Left common iliac venous stent appears to be patent. 4. **An incidental finding of potential clinical significance has been found. Poorly defined hyperdense or enhancing area along the lateral left hepatic lobe. Suspect this represents a perfusion abnormality but difficult to exclude an underlying lesion in this area. Recommend follow-up MRI of the liver, with and without contrast, as an outpatient when the patient has recovered from the acute event and can adequately hold his breath for a diagnostic study.** 5. Colonic diverticulosis.  4/19 limited TTE >> 1. Left ventricular diastolic parameters are consistent with Grade I  diastolic dysfunction (impaired  relaxation).  2. RV base diameter 4.6 cm.  When compared to prior, RV function has improved. . Right ventricular  systolic function is moderately reduced. The right ventricular size is  moderately enlarged. There is moderately elevated pulmonary artery  systolic pressure. The estimated right  ventricular  systolic pressure is 44.6 mmHg.  3. When compared to prior, RA thrombus is no longer present. . Right  atrial size was moderately dilated.  4. No evidence of mitral valve regurgitation.  5. Tricuspid valve regurgitation is moderate.  6. The inferior vena cava is normal in size with greater than 50%  respiratory variability, suggesting right atrial pressure of 3 mmHg.   Social History   Tobacco Use  Smoking Status Former Smoker  . Types: Cigarettes  . Quit date: 11/21/2016  . Years since quitting: 3.1  Smokeless Tobacco Never Used    There is no immunization history on file for this patient.    Assessment and Plan:  Pulmonary embolism ([place]) Plan: Continue Xarelto We will repeat echocardiogram in late July/2021 Established with our office in 6 to 8 weeks with appointment Dr. Valeta Harms Patient will need lifelong anticoagulation At next office visit can discuss whether or not patient would benefit from referral to hematology   Follow Up Instructions:  Return in about 2 months (around 03/08/2020), or if symptoms worsen or fail to improve, for Follow up with Dr. Valeta Harms.   I discussed the assessment and treatment plan with the patient. The patient was provided an opportunity to ask questions and all were answered. The patient agreed with the plan and demonstrated an understanding of the instructions.   The patient was advised to call back or seek an in-person evaluation if the symptoms worsen or if the condition fails to improve as anticipated.  I provided 25 minutes of non-face-to-face time during this encounter.   Lauraine Rinne, NP

## 2020-01-07 NOTE — Progress Notes (Signed)
PCCM: Thanks for seeing him.  [facility] 01/07/2020 5:36 PM

## 2020-01-07 NOTE — Assessment & Plan Note (Signed)
Plan: Continue Xarelto We will repeat echocardiogram in late July/2021 Established with our office in 6 to 8 weeks with appointment Dr. Valeta Harms Patient will need lifelong anticoagulation At next office visit can discuss whether or not patient would benefit from referral to hematology

## 2020-01-07 NOTE — Patient Instructions (Addendum)
You were seen today by Lauraine Rinne, NP  for:   1. Other acute pulmonary embolism without acute cor pulmonale (HCC)  - ECHOCARDIOGRAM COMPLETE; Future  Continue Xarelto  We will plan on repeating an echocardiogram in late July/2021  We recommend today:  Orders Placed This Encounter  Procedures  . ECHOCARDIOGRAM COMPLETE    Standing Status:   Future    Standing Expiration Date:   04/08/2020    Scheduling Instructions:     Completed in 1 month    Order Specific Question:   Where should this test be performed    Answer:   [facility])    Order Specific Question:   Does the patient weigh less than or greater than 250 lbs?    Answer:   Patient weighs less than 250 lbs    Order Specific Question:   Perflutren DEFINITY (image enhancing agent) should be administered unless hypersensitivity or allergy exist    Answer:   Administer Perflutren    Order Specific Question:   Reason for exam-Echo    Answer:   Pulmonary Embolus  415.19 / I26.99   Orders Placed This Encounter  Procedures  . ECHOCARDIOGRAM COMPLETE   No orders of the defined types were placed in this encounter.   Follow Up:    Return in about 2 months (around 03/08/2020), or if symptoms worsen or fail to improve, for Follow up with Dr. Valeta Harms.   Please do your part to reduce the spread of COVID-19:      Reduce your risk of any infection  and COVID19 by using the similar precautions used for avoiding the common cold or flu:  [name] Wash your hands often with soap and warm water for at least 20 seconds.  If soap and water are not readily available, use an alcohol-based hand sanitizer with at least 60% alcohol.  . If coughing or sneezing, cover your mouth and nose by coughing or sneezing into the elbow areas of your shirt or coat, into a tissue or into your sleeve (not your hands). Langley Gauss A MASK when in public  . Avoid shaking hands with others and consider head nods or verbal greetings only. . Avoid  touching your eyes, nose, or mouth with unwashed hands.  . Avoid close contact with people who are sick. . Avoid places or events with large numbers of people in one location, like concerts or sporting events. . If you have some symptoms but not all symptoms, continue to monitor at home and seek medical attention if your symptoms worsen. . If you are having a medical emergency, call 911.   [place] / e-Visit: [email or web address]         [facility]: [facility]: [phone]                   [facility]: [phone]     It is flu season:   >>> Best ways to protect herself from the flu: Receive the yearly flu vaccine, practice good hand hygiene washing with soap and also using hand sanitizer when available, eat a nutritious meals, get adequate rest, hydrate appropriately   Please contact the office if your symptoms worsen or you have concerns that you are not improving.   Thank you for choosing [facility] for your healthcare, and for allowing [place] to partner with you on your healthcare journey. I am thankful  to be able to provide care to you today.   Wyn Quaker FNP-C

## 2020-01-11 MED FILL — AMLODIPINE BESYLATE 10 MG T: 10 | 30 days supply | Qty: 30 | Fill #1

## 2020-01-14 ENCOUNTER — Other Ambulatory Visit: Payer: Self-pay

## 2020-01-14 ENCOUNTER — Ambulatory Visit (HOSPITAL_COMMUNITY)
Admission: RE | Admit: 2020-01-14 | Discharge: 2020-01-14 | Disposition: A | Discharge status: Home / Self Care | Payer: Medicaid Other | Source: Ambulatory Visit | Attending: Surgery | Admitting: Surgery

## 2020-01-14 ENCOUNTER — Ambulatory Visit (INDEPENDENT_AMBULATORY_CARE_PROVIDER_SITE_OTHER): Payer: Self-pay | Admitting: Surgery

## 2020-01-14 ENCOUNTER — Encounter: Payer: Self-pay | Admitting: Surgery

## 2020-01-14 ENCOUNTER — Ambulatory Visit (INDEPENDENT_AMBULATORY_CARE_PROVIDER_SITE_OTHER)
Admission: RE | Admit: 2020-01-14 | Discharge: 2020-01-14 | Disposition: A | Discharge status: Home / Self Care | Payer: Medicaid Other | Source: Ambulatory Visit | Attending: Surgery | Admitting: Surgery

## 2020-01-14 VITALS — BP 150/93 | HR 83 | Temp 98.0°F | Resp 18 | Ht 69.0 in | Wt 210.0 lb

## 2020-01-14 DIAGNOSIS — I2699 Other pulmonary embolism without acute cor pulmonale: Secondary | ICD-10-CM | POA: Diagnosis not present

## 2020-01-14 DIAGNOSIS — I871 Compression of vein: Secondary | ICD-10-CM

## 2020-01-14 NOTE — Progress Notes (Signed)
[facility] [place]  Patient name: Mitchell Cooper MRN: [id number] DOB: [birth year] Sex: male   REASON FOR VISIT:    Follow up  HISOTRY OF PRESENT ILLNESS:    Mitchell Cooper is a 59 y.o. male who initially I met in 2017 when he had a pulmonary embolism as well as a left iliofemoral DVT.  He underwent pharmacological and mechanical thrombolysis and subsequent stenting of a may Thurner syndrome in the left common iliac vein.  I have not seen him since 2018.  He had stopped his anticoagulation due to financial issues.    He was recently in the hospital in May 2021 for shortness of breath.  He was found to have multiple large central and peripheral pulmonary emboli with right heart strain.  He was given 100 mg of TPA when a echocardiogram revealed clot in transit.  He suffered a brief cardiac arrest requiring chest compressions.  ROSC was obtained.  He was started on heparin and transition to Xarelto.  The patient has a history of hypertension which is medically managed.  He has stage IIIa chronic renal insufficiency.  Most recent creatinine was 1.27.  He is a former smoker.   PAST MEDICAL HISTORY:   Past Medical History:  Diagnosis Date  . DVT (deep venous thrombosis) ([place])   . Hypertension 1997   has never taken medications  . Tobacco dependence      FAMILY HISTORY:   Family History  Problem Relation Age of Onset  . CAD Mother 31    SOCIAL HISTORY:   Social History   Tobacco Use  . Smoking status: Former Smoker    Types: Cigarettes    Quit date: 11/21/2016    Years since quitting: 3.1  . Smokeless tobacco: Never Used  Substance Use Topics  . Alcohol use: Yes    Alcohol/week: 6.0 standard drinks    Types: 6 Cans of beer per week     ALLERGIES:   No Known Allergies   CURRENT MEDICATIONS:   Current Outpatient Medications  Medication Sig Dispense Refill  . amLODipine (NORVASC) 10 MG tablet Take 1  tablet (10 mg total) by mouth daily. 30 tablet 4  . metoprolol succinate (TOPROL XL) 25 MG 24 hr tablet Take 0.5 tablets (12.5 mg total) by mouth daily. 15 tablet 4  . rivaroxaban (XARELTO) 20 MG TABS tablet Take 1 tablet (20 mg total) by mouth daily with supper. 30 tablet 1   No current facility-administered medications for this visit.    REVIEW OF SYSTEMS:   [X]  denotes positive finding, [ ]  denotes negative finding Cardiac  Comments:  Chest pain or chest pressure:    Shortness of breath upon exertion:    Short of breath when lying flat:    Irregular heart rhythm:        Vascular    Pain in calf, thigh, or hip brought on by ambulation:    Pain in feet at night that wakes you up from your sleep:     Blood clot in your veins: x   Leg swelling:  x       Pulmonary    Oxygen at home:    Productive cough:     Wheezing:         Neurologic    Sudden weakness in arms or legs:     Sudden numbness in arms or legs:     Sudden onset of difficulty speaking or slurred speech:    Temporary loss  of vision in one eye:     Problems with dizziness:         Gastrointestinal    Blood in stool:     Vomited blood:         Genitourinary    Burning when urinating:     Blood in urine:        Psychiatric    Major depression:         Hematologic    Bleeding problems:    Problems with blood clotting too easily:        Skin    Rashes or ulcers:        Constitutional    Fever or chills:      PHYSICAL EXAM:   Vitals:   01/14/20 0850  BP: (!) 150/93  Pulse: 83  Resp: 18  Temp: 98 F (36.7 C)  TempSrc: Temporal  SpO2: 97%  Weight: 210 lb (95.3 kg)  Height: 5' 9"  (1.753 m)    GENERAL: The patient is a well-nourished male, in no acute distress. The vital signs are documented above. CARDIAC: There is a regular rate and rhythm.  VASCULAR: Mild left leg edema PULMONARY: Non-labored respirations MUSCULOSKELETAL: There are no major deformities or cyanosis. NEUROLOGIC: No focal  weakness or paresthesias are detected. SKIN: There are no ulcers or rashes noted. PSYCHIATRIC: The patient has a normal affect.  STUDIES:   I have reviewed his vascular lab studies from today which shows partial resolution of thrombus in the mid and distal femoral vein and popliteal vein on the left.  His iliac stent remains widely patent.  MEDICAL ISSUES:   Recurrent DVT and PE: Stent in the left common iliac vein remains widely patent.  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  I discussed with him that he will require lifelong anticoagulation.  Should he have difficulty obtaining this, he will contact me for possible financial assistance.  Have him scheduled for follow-up of his left iliac vein stent in 1 year.    Mitchell Alf, MD, FACS [facility] [phone] Pager [phone]

## 2020-01-24 MED FILL — XARELTO 20 MG TABLET: 20 | 30 days supply | Qty: 30 | Fill #1

## 2020-02-07 ENCOUNTER — Ambulatory Visit: Payer: Self-pay | Admitting: Internal Medicine

## 2020-02-11 MED FILL — AMLODIPINE BESYLATE 10 MG T: 10 | 30 days supply | Qty: 30 | Fill #2

## 2020-02-12 ENCOUNTER — Ambulatory Visit (HOSPITAL_COMMUNITY): Payer: Medicaid Other | Attending: Cardiology

## 2020-02-12 ENCOUNTER — Other Ambulatory Visit: Payer: Self-pay

## 2020-02-12 DIAGNOSIS — I2699 Other pulmonary embolism without acute cor pulmonale: Secondary | ICD-10-CM | POA: Diagnosis not present

## 2020-02-12 LAB — ECHOCARDIOGRAM COMPLETE
Area-P 1/2: 3.72 cm2
S' Lateral: 2.2 cm

## 2020-02-13 NOTE — Progress Notes (Signed)
Spoke with pt and notified of results per Brian. Pt verbalized understanding and denied any questions. °

## 2020-02-27 MED FILL — XARELTO 20 MG TABLET: 20 | 30 days supply | Qty: 30 | Fill #0

## 2020-03-18 NOTE — Progress Notes (Signed)
@Patient  ID: Mitchell Cooper, male    DOB: 11-01-1960, 59 y.o.   MRN: [id number]  Chief Complaint  Patient presents with  . Follow-up    pt is congestion.pt has clear mucus    Referring provider: Ladell Pier, MD  HPI:  59 year old male former smoker consulted with our practice in April/2021 for acute PE  PMH: Chronic kidney disease, history of PE, May Thurner syndrome, acute PE, RVEF, thrombocytopenia Smoking history: Former smoker Maintenance: None Patient of Dr. Valeta Harms   03/19/2020  - Visit   59 year old male former smoker followed in our office for history of acute PE in April/2021.  Patient is followed by Dr. Valeta Harms. Patient was seen in June/2021 by vascular-Dr. Trula Slade.  Assessment and plan from that office visit is listed below:  Recurrent DVT and PE: Stent in the left common iliac vein remains widely patent.  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  I discussed with him that he will require lifelong anticoagulation.  Should he have difficulty obtaining this, he will contact me for possible financial assistance.  Have him scheduled for follow-up of his left iliac vein stent in 1 year.  Patient reports no acute changes since last being seen.  He continues to not smoke.  He remains adherent to Xarelto.  He occasionally has some lower extremity swelling when sitting or driving for long periods of time.  He plans to have follow-up with vein and vascular in June/2022.  Questionaires / Pulmonary Flowsheets:   ACT:  No flowsheet data found.  MMRC: No flowsheet data found.  Epworth:  No flowsheet data found.  Tests:  FENO:  No results found for: NITRICOXIDE  PFT: No flowsheet data found.  WALK:  No flowsheet data found.  Imaging: No results found.  Lab Results:  CBC    Component Value Date/Time   WBC 7.6 12/10/2019 1348   WBC 9.8 11/06/2019 0231   RBC 5.55 12/10/2019 1348   RBC 5.02 11/06/2019 0231   HGB 14.9 12/10/2019 1348   HCT 44.5 12/10/2019 1348    PLT 209 12/10/2019 1348   MCV 80 12/10/2019 1348   MCH 26.8 12/10/2019 1348   MCH 27.5 11/06/2019 0231   MCHC 33.5 12/10/2019 1348   MCHC 33.8 11/06/2019 0231   RDW 15.9 (H) 12/10/2019 1348   LYMPHSABS 2.0 11/04/2019 1426   MONOABS 1.4 (H) 11/04/2019 1426   EOSABS 0.1 11/04/2019 1426   BASOSABS 0.1 11/04/2019 1426    BMET    Component Value Date/Time   NA 139 12/10/2019 1348   K 4.1 12/10/2019 1348   CL 104 12/10/2019 1348   CO2 21 12/10/2019 1348   GLUCOSE 99 12/10/2019 1348   GLUCOSE 115 (H) 11/06/2019 0231   BUN 17 12/10/2019 1348   CREATININE 1.07 12/10/2019 1348   CALCIUM 9.3 12/10/2019 1348   GFRNONAA 77 12/10/2019 1348   GFRAA 89 12/10/2019 1348    BNP    Component Value Date/Time   BNP 637.0 (H) 11/03/2019 1722    ProBNP No results found for: PROBNP  Specialty Problems    None      No Known Allergies  Immunization History  Administered Date(s) Administered  . PFIZER SARS-COV-2 Vaccination 10/01/2019, 10/15/2019    Past Medical History:  Diagnosis Date  . DVT (deep venous thrombosis) ([place])   . Hypertension 1997   has never taken medications  . Tobacco dependence     Tobacco History: Social History   Tobacco Use  Smoking Status  Former Smoker  . Types: Cigarettes  . Quit date: 11/21/2016  . Years since quitting: 3.3  Smokeless Tobacco Never Used   Counseling given: Not Answered   Continue to not smoke  Outpatient Encounter Medications as of 03/19/2020  Medication Sig  . amLODipine (NORVASC) 10 MG tablet Take 1 tablet (10 mg total) by mouth daily.  . metoprolol succinate (TOPROL XL) 25 MG 24 hr tablet Take 0.5 tablets (12.5 mg total) by mouth daily.  . rivaroxaban (XARELTO) 20 MG TABS tablet Take 1 tablet (20 mg total) by mouth daily with supper.   No facility-administered encounter medications on file as of 03/19/2020.     Review of Systems  Review of Systems  Constitutional: Positive for fatigue. Negative for activity change,  chills, fever and unexpected weight change.  HENT: Positive for congestion and postnasal drip. Negative for rhinorrhea, sinus pressure, sinus pain and sore throat.   Eyes: Negative.   Respiratory: Positive for cough. Negative for shortness of breath and wheezing.   Cardiovascular: Negative for chest pain and palpitations.  Gastrointestinal: Negative for constipation, diarrhea, nausea and vomiting.  Endocrine: Negative.   Genitourinary: Negative.   Musculoskeletal: Negative.   Skin: Negative.   Neurological: Negative for dizziness and headaches.  Psychiatric/Behavioral: Negative.  Negative for dysphoric mood. The patient is not nervous/anxious.   All other systems reviewed and are negative.    Physical Exam  BP 128/80 (BP Location: Left Arm, Cuff Size: Normal)   Pulse 96   Temp 98 F (36.7 C) (Oral)   Ht 5\' 9"  (1.753 m)   Wt 217 lb (98.4 kg)   SpO2 96%   BMI 32.05 kg/m   Wt Readings from Last 5 Encounters:  03/19/20 217 lb (98.4 kg)  01/14/20 210 lb (95.3 kg)  12/07/19 215 lb 9.6 oz (97.8 kg)  11/26/19 211 lb (95.7 kg)  11/04/19 206 lb (93.4 kg)    BMI Readings from Last 5 Encounters:  03/19/20 32.05 kg/m  01/14/20 31.01 kg/m  12/07/19 31.84 kg/m  11/26/19 31.16 kg/m  11/04/19 30.42 kg/m     Physical Exam Vitals and nursing note reviewed.  Constitutional:      General: He is not in acute distress.    Appearance: Normal appearance. He is obese.  HENT:     Head: Normocephalic and atraumatic.     Right Ear: Hearing, tympanic membrane, ear canal and external ear normal.     Left Ear: Hearing, tympanic membrane, ear canal and external ear normal.     Nose: Nose normal. No mucosal edema or rhinorrhea.     Right Turbinates: Not enlarged.     Left Turbinates: Not enlarged.     Mouth/Throat:     Mouth: Mucous membranes are dry.     Pharynx: Oropharynx is clear. No oropharyngeal exudate.  Eyes:     Pupils: Pupils are equal, round, and reactive to light.    Cardiovascular:     Rate and Rhythm: Normal rate and regular rhythm.     Pulses: Normal pulses.     Heart sounds: Normal heart sounds. No murmur heard.   Pulmonary:     Effort: Pulmonary effort is normal.     Breath sounds: Normal breath sounds. No decreased breath sounds, wheezing or rales.  Musculoskeletal:     Cervical back: Normal range of motion.     Right lower leg: No edema.     Left lower leg: No edema.  Lymphadenopathy:     Cervical: No cervical adenopathy.  Skin:    General: Skin is warm and dry.     Capillary Refill: Capillary refill takes less than 2 seconds.     Findings: No erythema or rash.  Neurological:     General: No focal deficit present.     Mental Status: He is alert and oriented to person, place, and time.     Motor: No weakness.     Coordination: Coordination normal.     Gait: Gait is intact. Gait normal.  Psychiatric:        Mood and Affect: Mood normal.        Behavior: Behavior normal. Behavior is cooperative.        Thought Content: Thought content normal.        Judgment: Judgment normal.       Assessment & Plan:   History of pulmonary embolus (PE) Recommending lifelong anticoagulation  Plan: Continue Xarelto 68-month follow-up with our office   VTE (venous thromboembolism) Plan: Lifelong anticoagulation Continue Xarelto Keep follow-up with vein and vascular in June/2022 73-month follow-up with our office    Return in about 6 months (around 09/16/2020), or if symptoms worsen or fail to improve, for Follow up with Dr. Valeta Harms.   Lauraine Rinne, NP 03/19/2020   This appointment required 25 minutes of patient care (this includes precharting, chart review, review of results, face-to-face care, etc.).

## 2020-03-19 ENCOUNTER — Ambulatory Visit (INDEPENDENT_AMBULATORY_CARE_PROVIDER_SITE_OTHER): Payer: Self-pay | Admitting: Pulmonary Disease

## 2020-03-19 ENCOUNTER — Ambulatory Visit: Payer: Self-pay | Admitting: Pulmonary Disease

## 2020-03-19 ENCOUNTER — Other Ambulatory Visit: Payer: Self-pay

## 2020-03-19 ENCOUNTER — Encounter: Payer: Self-pay | Admitting: Pulmonary Disease

## 2020-03-19 VITALS — BP 128/80 | HR 96 | Temp 98.0°F | Ht 69.0 in | Wt 217.0 lb

## 2020-03-19 DIAGNOSIS — I8290 Acute embolism and thrombosis of unspecified vein: Secondary | ICD-10-CM

## 2020-03-19 DIAGNOSIS — Z86711 Personal history of pulmonary embolism: Secondary | ICD-10-CM

## 2020-03-19 NOTE — Assessment & Plan Note (Signed)
Plan: Lifelong anticoagulation Continue Xarelto Keep follow-up with vein and vascular in June/2022 5-month follow-up with our office

## 2020-03-19 NOTE — Patient Instructions (Addendum)
You were seen today by Lauraine Rinne, NP  for:   1. History of pulmonary embolus (PE) 2. VTE (venous thromboembolism)  Continue Xarelto  Keep follow-up with vascular in June/2022  Notify our office if you have any acute worsening symptoms or bleeding episodes  We recommend the seasonal flu vaccine, please obtain in fall/2021  Follow Up:    Return in about 6 months (around 09/16/2020), or if symptoms worsen or fail to improve, for Follow up with Dr. Valeta Harms.   Please do your part to reduce the spread of COVID-19:      Reduce your risk of any infection  and COVID19 by using the similar precautions used for avoiding the common cold or flu:  [name] Wash your hands often with soap and warm water for at least 20 seconds.  If soap and water are not readily available, use an alcohol-based hand sanitizer with at least 60% alcohol.  . If coughing or sneezing, cover your mouth and nose by coughing or sneezing into the elbow areas of your shirt or coat, into a tissue or into your sleeve (not your hands). Langley Gauss A MASK when in public  . Avoid shaking hands with others and consider head nods or verbal greetings only. . Avoid touching your eyes, nose, or mouth with unwashed hands.  . Avoid close contact with people who are sick. . Avoid places or events with large numbers of people in one location, like concerts or sporting events. . If you have some symptoms but not all symptoms, continue to monitor at home and seek medical attention if your symptoms worsen. . If you are having a medical emergency, call 911.   [place] / e-Visit: [email or web address]         [facility]: [facility]: [phone]                   [facility]: [phone]     It is flu season:   >>> Best ways to protect herself from the flu: Receive the yearly flu vaccine,  practice good hand hygiene washing with soap and also using hand sanitizer when available, eat a nutritious meals, get adequate rest, hydrate appropriately   Please contact the office if your symptoms worsen or you have concerns that you are not improving.   Thank you for choosing [facility] for your healthcare, and for allowing [place] to partner with you on your healthcare journey. I am thankful to be able to provide care to you today.   Wyn Quaker FNP-C

## 2020-03-19 NOTE — Assessment & Plan Note (Signed)
Recommending lifelong anticoagulation  Plan: Continue Xarelto 53-month follow-up with our office

## 2020-03-31 ENCOUNTER — Ambulatory Visit (HOSPITAL_COMMUNITY)
Admission: RE | Admit: 2020-03-31 | Discharge: 2020-03-31 | Disposition: A | Discharge status: Home / Self Care | Payer: Medicaid Other | Source: Ambulatory Visit | Attending: Internal Medicine | Admitting: Internal Medicine

## 2020-03-31 ENCOUNTER — Other Ambulatory Visit: Payer: Self-pay

## 2020-03-31 ENCOUNTER — Encounter (HOSPITAL_COMMUNITY): Payer: Self-pay | Admitting: Internal Medicine

## 2020-03-31 VITALS — BP 133/93 | HR 86 | Temp 98.3°F | Ht 69.0 in | Wt 219.4 lb

## 2020-03-31 DIAGNOSIS — Z86711 Personal history of pulmonary embolism: Secondary | ICD-10-CM | POA: Insufficient documentation

## 2020-03-31 DIAGNOSIS — I110 Hypertensive heart disease with heart failure: Secondary | ICD-10-CM | POA: Insufficient documentation

## 2020-03-31 DIAGNOSIS — Z79899 Other long term (current) drug therapy: Secondary | ICD-10-CM | POA: Diagnosis not present

## 2020-03-31 DIAGNOSIS — I50810 Right heart failure, unspecified: Secondary | ICD-10-CM

## 2020-03-31 DIAGNOSIS — Z7901 Long term (current) use of anticoagulants: Secondary | ICD-10-CM | POA: Diagnosis not present

## 2020-03-31 DIAGNOSIS — Z86718 Personal history of other venous thrombosis and embolism: Secondary | ICD-10-CM | POA: Diagnosis not present

## 2020-03-31 DIAGNOSIS — I5089 Other heart failure: Secondary | ICD-10-CM | POA: Diagnosis not present

## 2020-03-31 DIAGNOSIS — Z87891 Personal history of nicotine dependence: Secondary | ICD-10-CM | POA: Diagnosis not present

## 2020-03-31 DIAGNOSIS — Z8616 Personal history of COVID-19: Secondary | ICD-10-CM | POA: Insufficient documentation

## 2020-03-31 DIAGNOSIS — I2699 Other pulmonary embolism without acute cor pulmonale: Secondary | ICD-10-CM

## 2020-03-31 DIAGNOSIS — I10 Essential (primary) hypertension: Secondary | ICD-10-CM

## 2020-03-31 MED FILL — AMLODIPINE BESYLATE 10 MG T: 10 | 30 days supply | Qty: 30 | Fill #3

## 2020-03-31 NOTE — Progress Notes (Signed)
Advanced Heart Failure Clinic Note   Referring Physician: PCP: Ladell Pier, MD PCP-Cardiologist: Dr. Haroldine Laws   Reason for Visit: [facility] F/u for PE w/ RV Strain   HPI: Mr. Shiffler is a 59 year old obese male with a past medical history of DVT/PE, hypertension, stage 3 CKD, Covid-19 infection in 05/20/2019 and tobacco abuse, recently admitted for recurrent pulmonary embolism.   He had his first PE approximately 3 years ago in 2017 and he had stopped taking Eliquis approximately 9 months ago because he could not afford it. Received his second Covid-19 vaccination several days before noting progressive shortness of breath. Presented to ER 4/17. On arrival was tachycardic in the 130s, tachypneic and oxygen saturations as low as 91% so he was placed on 3 L nasal cannula. No hypotension. BNP 637, high-sensitivity troponin 91, creatinine 1.8. CTA was obtained showing multiple large central and peripheral PEs with filling defects bilaterally and RV strain. CCM consulted and required intubation.   Echo showed LVEF 65% with compressed LV. RV markedly dilated with severe RV dysfunction and large mobile clot extending from IVC into RA bobbing across TV. RVSP ~27mmHG  Patient received T-PA. About 2.5 hours into 3 hour T-PA infusion, patient developed PEA arrest and was resuscitated in 30 seconds. He was considered for ECMO but not felt indicated.  He improved quickly and w/ improved oxygenation and successfully extubated. Echo repeated and showed resolution of clot and RV strain improved, RV systolic function moderately reduced. He was transitioned off IV heparin to Xarelto.  Today he returns for HF follow up.Overall feeling fine. Mild SOB with steps. Denies/PND/Orthopnea. Appetite ok. No fever or chills. No bleeding issues.  Ran out of amlodipine on Saturday. Plans to pick up today. No longer smoking.   01/2020 EF 60-65% RV normal.    Past Medical History:  Diagnosis Date  . DVT  (deep venous thrombosis) ([place])   . Hypertension 1997   has never taken medications  . Tobacco dependence     Current Outpatient Medications  Medication Sig Dispense Refill  . amLODipine (NORVASC) 10 MG tablet Take 1 tablet (10 mg total) by mouth daily. 30 tablet 4  . rivaroxaban (XARELTO) 20 MG TABS tablet Take 1 tablet (20 mg total) by mouth daily with supper. 30 tablet 1   No current facility-administered medications for this encounter.    No Known Allergies    Social History   Socioeconomic History  . Marital status: Married    Spouse name: Not on file  . Number of children: Not on file  . Years of education: Not on file  . Highest education level: Not on file  Occupational History  . Occupation: Architect  Tobacco Use  . Smoking status: Former Smoker    Types: Cigarettes    Quit date: 11/21/2016    Years since quitting: 3.3  . Smokeless tobacco: Never Used  Vaping Use  . Vaping Use: Never used  Substance and Sexual Activity  . Alcohol use: Yes    Alcohol/week: 6.0 standard drinks    Types: 6 Cans of beer per week  . Drug use: No  . Sexual activity: Not on file  Other Topics Concern  . Not on file  Social History Narrative  . Not on file   Social Determinants of Health   Financial Resource Strain:   . Difficulty of Paying Living Expenses: Not on file  Food Insecurity:   . Worried About Charity fundraiser in the Last Year: Not on file  .  Ran Out of Food in the Last Year: Not on file  Transportation Needs: No Transportation Needs  . Lack of Transportation (Medical): No  . Lack of Transportation (Non-Medical): No  Physical Activity: Inactive  . Days of Exercise per Week: 0 days  . Minutes of Exercise per Session: 0 min  Stress:   . Feeling of Stress : Not on file  Social Connections:   . Frequency of Communication with Friends and Family: Not on file  . Frequency of Social Gatherings with Friends and Family: Not on file  . Attends Religious Services:  Not on file  . Active Member of Clubs or Organizations: Not on file  . Attends Archivist Meetings: Not on file  . Marital Status: Not on file  Intimate Partner Violence:   . Fear of Current or Ex-Partner: Not on file  . Emotionally Abused: Not on file  . Physically Abused: Not on file  . Sexually Abused: Not on file      Family History  Problem Relation Age of Onset  . CAD Mother 42    Vitals:   03/31/20 1214  BP: (!) 133/93  Pulse: 86  Temp: 98.3 F (36.8 C)  TempSrc: Oral  SpO2: 98%  Weight: 99.5 kg (219 lb 6.4 oz)  Height: 5\' 9"  (1.753 m)     PHYSICAL EXAM: General:  Well appearing. No resp difficulty HEENT: normal Neck: supple. no JVD. Carotids 2+ bilat; no bruits. No lymphadenopathy or thryomegaly appreciated. Cor: PMI nondisplaced. Regular rate & rhythm. No rubs, gallops or murmurs. Lungs: clear Abdomen: soft, nontender, nondistended. No hepatosplenomegaly. No bruits or masses. Good bowel sounds. Extremities: no cyanosis, clubbing, rash, edema Neuro: alert & orientedx3, cranial nerves grossly intact. moves all 4 extremities w/o difficulty. Affect pleasant  ASSESSMENT & PLAN:  1. Submassive PE with severe RV strain - previous unprovoked clot in 2017. Had been off A/c x 9 months due to cost - recent COVID illness (11/20)  - Recent admit 4/21 for recurrent PE/ acute hypoxic respiratory failure w/ subsequent RV strain and PEA arrest.   - CT showed submassive bilateral PE with RV strain - initial echo LVEF 60-65% with severe RV dilation and dysfunction + clot in transit - s/p 100mg  systemic T-PA 4/18 - repeat echo showed resolution of clot in transit. RV function improved, severe>>moderate  - back on NOAC, Xarelto 15 mg bid x 21 days>>20 mg daily. He reports full compliance - he will need lifelong a/c given recurrent, unprovoked PE   2. RV Failure - per above, in the setting of PE, moderately reduced by TTE. LVEF normal.  - ECHO 01/2020 Ef 60-65%  with normal RV.   - Check VQ scan in 2 months    3. HTN - continue amlodipine 10 mg daily    Darrick Grinder, NP 03/31/20   Agree with above.   Doing well. NYHA I. Echo in 7/21 shows normal LV and RV function. RV appears to have recovered completely. Trivial TR. No RV strain or evidence of PAH. On xarelto. No bleeding.   General:  Well appearing. No resp difficulty HEENT: normal Neck: supple. no JVD. Carotids 2+ bilat; no bruits. No lymphadenopathy or thryomegaly appreciated. Cor: PMI nondisplaced. Regular rate & rhythm. No rubs, gallops or murmurs. Lungs: clear Abdomen: soft, nontender, nondistended. No hepatosplenomegaly. No bruits or masses. Good bowel sounds. Extremities: no cyanosis, clubbing, rash, edema Neuro: alert & orientedx3, cranial nerves grossly intact. moves all 4 extremities w/o difficulty. Affect pleasant  Doing  well. Echo reviewed personally and RV looks good with no residual RV strain or PAH. Wil continue Xarelto. Will plan VQ in 3 months to assess for residual clot. BP a bit high this am but didn't take amlodipine this am. Encouraged med compliance.   Glori Bickers, MD  3:53 PM

## 2020-03-31 NOTE — Patient Instructions (Signed)
Follow up with our clinic in 3 months. We will contact you to schedule an appointment.  Your physician recommends that you have a VD scan in 2 months. We will check with insurance and contact you   If you have any questions or concerns before your next appointment please send [place] a message through [place] or call our office at [phone].    TO LEAVE A MESSAGE FOR THE NURSE SELECT OPTION 2, PLEASE LEAVE A MESSAGE INCLUDING: . YOUR NAME . DATE OF BIRTH . CALL BACK NUMBER . REASON FOR CALL**this is important as we prioritize the call backs  [place] AS LONG AS YOU CALL BEFORE 4:00 PM  At the [facility], you and your health needs are our priority. As part of our continuing mission to provide you with exceptional heart care, we have created designated Provider Care Teams. These Care Teams include your primary Cardiologist (physician) and Advanced Practice Providers (APPs- Physician Assistants and Nurse Practitioners) who all work together to provide you with the care you need, when you need it.   You may see any of the following providers on your designated Care Team at your next follow up: [name] Dr Glori Bickers . Dr Loralie Champagne . Darrick Grinder, NP . Lyda Jester, PA . Audry Riles, PharmD   Please be sure to bring in all your medications bottles to every appointment.

## 2020-04-01 ENCOUNTER — Ambulatory Visit: Payer: Self-pay | Admitting: Internal Medicine

## 2020-04-01 ENCOUNTER — Ambulatory Visit: Payer: Self-pay | Admitting: Primary Care

## 2020-04-01 MED FILL — XARELTO 20 MG TABLET: 20 | 30 days supply | Qty: 30 | Fill #1

## 2020-04-03 ENCOUNTER — Other Ambulatory Visit (HOSPITAL_COMMUNITY): Payer: Self-pay | Admitting: Cardiology

## 2020-04-03 DIAGNOSIS — R0602 Shortness of breath: Secondary | ICD-10-CM

## 2020-04-08 ENCOUNTER — Ambulatory Visit: Payer: MEDICAID | Attending: Internal Medicine | Admitting: Internal Medicine

## 2020-04-08 ENCOUNTER — Other Ambulatory Visit: Payer: Self-pay | Admitting: Internal Medicine

## 2020-04-08 DIAGNOSIS — I2699 Other pulmonary embolism without acute cor pulmonale: Secondary | ICD-10-CM

## 2020-04-08 DIAGNOSIS — I10 Essential (primary) hypertension: Secondary | ICD-10-CM

## 2020-04-08 DIAGNOSIS — Z23 Encounter for immunization: Secondary | ICD-10-CM

## 2020-04-08 DIAGNOSIS — Z1211 Encounter for screening for malignant neoplasm of colon: Secondary | ICD-10-CM

## 2020-04-08 DIAGNOSIS — Z87891 Personal history of nicotine dependence: Secondary | ICD-10-CM | POA: Insufficient documentation

## 2020-04-08 DIAGNOSIS — M170 Bilateral primary osteoarthritis of knee: Secondary | ICD-10-CM

## 2020-04-08 MED ORDER — AMLODIPINE BESYLATE 10 MG PO TABS: 10.0000 mg | ORAL_TABLET | Freq: Every day | ORAL | 6 refills | Status: DC

## 2020-04-08 MED ORDER — RIVAROXABAN 20 MG PO TABS: 20.0000 mg | ORAL_TABLET | Freq: Every day | ORAL | 6 refills | Status: DC

## 2020-04-08 NOTE — Progress Notes (Signed)
Virtual Visit via Telephone Note Due to current restrictions/limitations of in-office visits due to the COVID-19 pandemic, this scheduled clinical appointment was converted to a telehealth visit  I connected with Mitchell Cooper on 04/08/20 at 11:29 a.m by telephone and verified that I am speaking with the correct person using two identifiers. I am in my office.  The patient is at home.  Only the patient, my CMA Sallyanne Havers and myself participated in this encounter.  I discussed the limitations, risks, security and privacy concerns of performing an evaluation and management service by telephone and the availability of in person appointments. I also discussed with the patient that there may be a patient responsible charge related to this service. The patient expressed understanding and agreed to proceed.   History of Present Illness: Patient with history of HTN, tob dep,  OA knees, COVID-19 infection 05/2019, DVT/PE 2017 (and massive PE 10/2019 req thrombolytics, PEA arrest).  Last seen 11/2019.  Purpose of today's visit is chronic disease management.  PE/right ventricular failure: Since last visit with me, patient has followed up with the cardiologist.  Saw Dr. Haroldine Laws earlier this month.  His repeat echo showed that his right ventricular systolic function has normalized and the right ventricle size has normalized.  EF was 60 to 65%.  The cardiologist pulmonologist and vascular surgeon recommended lifelong anticoagulation. -He has not returned to work.  He tells me that the cardiologist has put him out on disability.  He has applied for disability.  He is waiting to see whether he has been approved for Medicaid. -Reports compliance with Xarelto.  Denies any bruising or bleeding on the medication.  Denies any chest pains.  Some shortness of breath only when walking up inclines. -The other reason he has not return to work is that he gets intermittent swelling in the knees with prolonged standing.  He has  known arthritis in both knees and has had problems in the past with recurrent large joint effusions.  HTN: Reports compliance with taking amlodipine.  Blood pressure on the first of this month on visit with the pulmonologist was at goal.  However blood pressure was elevated on most recent visit with the cardiologist.  Patient states he had not taken his amlodipine that day before going to the appointment.  He limits salt in the foods.  Tobacco dependence: He stopped smoking 2-1/2 months ago.  Health maintenance: Due for flu and Tdap vaccines.  Also due for colon cancer screening.  Outpatient Encounter Medications as of 04/08/2020  Medication Sig  . amLODipine (NORVASC) 10 MG tablet Take 1 tablet (10 mg total) by mouth daily.  . rivaroxaban (XARELTO) 20 MG TABS tablet Take 1 tablet (20 mg total) by mouth daily with supper.   No facility-administered encounter medications on file as of 04/08/2020.      Observations/Objective: Results for orders placed or performed in visit on 02/12/20  ECHOCARDIOGRAM COMPLETE  Result Value Ref Range   Area-P 1/2 3.72 cm2   S' Lateral 2.20 cm   Lab Results  Component Value Date   WBC 7.6 12/10/2019   HGB 14.9 12/10/2019   HCT 44.5 12/10/2019   MCV 80 12/10/2019   PLT 209 12/10/2019     Chemistry      Component Value Date/Time   NA 139 12/10/2019 1348   K 4.1 12/10/2019 1348   CL 104 12/10/2019 1348   CO2 21 12/10/2019 1348   BUN 17 12/10/2019 1348   CREATININE 1.07 12/10/2019 1348  Component Value Date/Time   CALCIUM 9.3 12/10/2019 1348   ALKPHOS 61 11/04/2019 1426   AST 46 (H) 11/04/2019 1426   ALT 31 11/04/2019 1426   BILITOT 1.4 (H) 11/04/2019 1426       Assessment and Plan: 1. Recurrent pulmonary emboli ([place]) Plan is to continue Xarelto lifelong.  Stressed the importance of compliance with taking the medication. -He certainly is clinically improved.  Heart function normal on echo and pulmonary artery pressure has also  normalized. - rivaroxaban (XARELTO) 20 MG TABS tablet; Take 1 tablet (20 mg total) by mouth daily with supper.  Dispense: 30 tablet; Refill: 6  2. Essential hypertension Controlled when he takes the medication.  Continue amlodipine and low-salt diet - amLODipine (NORVASC) 10 MG tablet; Take 1 tablet (10 mg total) by mouth daily.  Dispense: 30 tablet; Refill: 6  3. Primary osteoarthritis of both knees Advised patient that we can refer him to orthopedics.  He anticipates getting Medicaid within the next few weeks.  We can refer him at that time.  He will call to let us know once approved.  4. Screening for colon cancer Discussed colon cancer screening methods.  Since he does not have insurance at this time.  I told him we can do the fit test.  He tells me he plans to schedule appointment to get his flu shot.  When he comes in for that, I have told him to request the kit for the colon cancer screening. - Fecal occult blood, imunochemical([facility])  5. Need for influenza vaccination Patient declines to have me schedule the appointment for him to come see our clinical pharmacist to get his flu shot.  He states that he will call back and schedule himself within the next 2 weeks.  36.  Former smoker Commended him on quitting.  Encouraged him to remain tobacco free.  Less than 5 minutes spent on counseling. Follow Up Instructions: 4 months   I discussed the assessment and treatment plan with the patient. The patient was provided an opportunity to ask questions and all were answered. The patient agreed with the plan and demonstrated an understanding of the instructions.   The patient was advised to call back or seek an in-person evaluation if the symptoms worsen or if the condition fails to improve as anticipated.  I provided 13 minutes of non-face-to-face time during this encounter.   Karle Plumber, MD

## 2020-04-14 ENCOUNTER — Other Ambulatory Visit (HOSPITAL_COMMUNITY): Payer: Self-pay | Admitting: *Deleted

## 2020-04-14 DIAGNOSIS — R079 Chest pain, unspecified: Secondary | ICD-10-CM

## 2020-05-07 MED FILL — XARELTO 20 MG TABLET: 20 | 30 days supply | Qty: 30 | Fill #0

## 2020-05-09 DIAGNOSIS — M25561 Pain in right knee: Secondary | ICD-10-CM | POA: Diagnosis not present

## 2020-05-14 MED FILL — AMLODIPINE BESYLATE 10 MG T: 10 | 30 days supply | Qty: 30 | Fill #4

## 2020-05-30 ENCOUNTER — Ambulatory Visit (HOSPITAL_COMMUNITY)
Admission: RE | Admit: 2020-05-30 | Discharge: 2020-05-30 | Disposition: A | Discharge status: Home / Self Care | Payer: Medicaid Other | Source: Ambulatory Visit | Attending: Cardiology | Admitting: Cardiology

## 2020-05-30 ENCOUNTER — Encounter (HOSPITAL_COMMUNITY)
Admission: RE | Admit: 2020-05-30 | Discharge: 2020-05-30 | Disposition: A | Discharge status: Home / Self Care | Payer: Medicaid Other | Source: Ambulatory Visit | Attending: Internal Medicine | Admitting: Internal Medicine

## 2020-05-30 ENCOUNTER — Other Ambulatory Visit: Payer: Self-pay

## 2020-05-30 DIAGNOSIS — R079 Chest pain, unspecified: Secondary | ICD-10-CM

## 2020-05-30 DIAGNOSIS — R0602 Shortness of breath: Secondary | ICD-10-CM | POA: Diagnosis not present

## 2020-05-30 DIAGNOSIS — Z86711 Personal history of pulmonary embolism: Secondary | ICD-10-CM | POA: Diagnosis not present

## 2020-05-30 MED ORDER — TECHNETIUM TO 99M ALBUMIN AGGREGATED
4.2000 | Freq: Once | INTRAVENOUS | Status: AC | PRN
  Administered 2020-05-30: 4.2 via INTRAVENOUS

## 2020-06-10 MED FILL — XARELTO 20 MG TABLET: 20 | 30 days supply | Qty: 30 | Fill #1

## 2020-06-27 MED FILL — AMLODIPINE BESYLATE 10 MG T: 10 | 30 days supply | Qty: 30 | Fill #0

## 2020-07-02 DIAGNOSIS — Z0289 Encounter for other administrative examinations: Secondary | ICD-10-CM

## 2020-07-09 ENCOUNTER — Encounter: Payer: Self-pay | Admitting: Internal Medicine

## 2020-07-09 ENCOUNTER — Other Ambulatory Visit: Payer: Self-pay

## 2020-07-09 ENCOUNTER — Ambulatory Visit: Payer: Medicaid Other | Attending: Internal Medicine | Admitting: Internal Medicine

## 2020-07-09 VITALS — BP 129/95 | HR 89 | Temp 98.3°F | Resp 16 | Wt 219.0 lb

## 2020-07-09 DIAGNOSIS — R221 Localized swelling, mass and lump, neck: Secondary | ICD-10-CM

## 2020-07-09 NOTE — Assessment & Plan Note (Signed)
Patient has a large left-sided left mass.  Unclear etiology.  It has been ongoing for several months.  The mass is mobile and soft.  Needs to be evaluated for malignancy although the clinical findings are not necessarily consistent.  I have consulted radiology.  He needs a CT of the neck with contrast.  We will also get some laboratory work including CBC, LDH.

## 2020-07-09 NOTE — Progress Notes (Signed)
Has concerns about knot on the side of his neck. Denies pain Noticed this after having Covid in 2020  He has noted an enlarging mass on left side of neck for the past ~ one year. He states that he noted these sxs after having covid in 11/20. He thinks the mass is aggravated because he tries to "cough up" mucous.   He feels well, he eats well, no trouble with sleeping. No cough, no trouble swallowing. No fever, chills, weight loss, night sweats.    Past Medical History:  Diagnosis Date  . DVT (deep venous thrombosis) ([place])   . Hypertension 1997   has never taken medications  . Tobacco dependence     Social History   Socioeconomic History  . Marital status: Married    Spouse name: Not on file  . Number of children: Not on file  . Years of education: Not on file  . Highest education level: Not on file  Occupational History  . Occupation: Architect  Tobacco Use  . Smoking status: Former Smoker    Types: Cigarettes    Quit date: 11/21/2016    Years since quitting: 3.6  . Smokeless tobacco: Never Used  Vaping Use  . Vaping Use: Never used  Substance and Sexual Activity  . Alcohol use: Yes    Alcohol/week: 6.0 standard drinks    Types: 6 Cans of beer per week  . Drug use: No  . Sexual activity: Not on file  Other Topics Concern  . Not on file  Social History Narrative  . Not on file   Social Determinants of Health   Financial Resource Strain: Not on file  Food Insecurity: Not on file  Transportation Needs: No Transportation Needs  . Lack of Transportation (Medical): No  . Lack of Transportation (Non-Medical): No  Physical Activity: Inactive  . Days of Exercise per Week: 0 days  . Minutes of Exercise per Session: 0 min  Stress: Not on file  Social Connections: Not on file  Intimate Partner Violence: Not on file    Past Surgical History:  Procedure Laterality Date  . KNEE SURGERY  approx. 1984   reconstructive surgery of left knee; arthroscopic procedure about  1997  . PERIPHERAL VASCULAR CATHETERIZATION Left 06/16/2016   Procedure: Lower Extremity Venography- Venus Thrombolysis;  Surgeon: Serafina Mitchell, MD;  Location: [facility];  Service: Cardiovascular;  Laterality: Left;    Family History  Problem Relation Age of Onset  . CAD Mother 58    No Known Allergies  Current Outpatient Medications on File Prior to Visit  Medication Sig Dispense Refill  . amLODipine (NORVASC) 10 MG tablet Take 1 tablet (10 mg total) by mouth daily. 30 tablet 6  . rivaroxaban (XARELTO) 20 MG TABS tablet Take 1 tablet (20 mg total) by mouth daily with supper. 30 tablet 6   No current facility-administered medications on file prior to visit.     patient denies chest pain, shortness of breath, orthopnea. Denies lower extremity edema, abdominal pain, change in appetite, change in bowel movements. Patient denies rashes, musculoskeletal complaints. No other specific complaints in a complete review of systems.   BP (!) 129/95   Pulse 89   Temp 98.3 F (36.8 C) (Oral)   Resp 16   Wt 219 lb (99.3 kg)   SpO2 98%   BMI 32.34 kg/m   well-developed well-nourished male in no acute distress. HEENT exam atraumatic, normocephalic, neck supple without jugular venous distention. There is a large  Left neck mass measuring 6x8cm. The mass is mobile and soft.  Chest clear to auscultation cardiac exam S1-S2 are regular. Abdominal exam overweight with bowel sounds, soft and nontender. Extremities no edema. Neurologic exam is alert with a normal gait.  Mass of left side of neck Patient has a large left-sided left mass.  Unclear etiology.  It has been ongoing for several months.  The mass is mobile and soft.  Needs to be evaluated for malignancy although the clinical findings are not necessarily consistent.  I have consulted radiology.  He needs a CT of the neck with contrast.  We will also get some laboratory work including CBC, LDH.

## 2020-07-10 MED FILL — XARELTO 20 MG TABLET: 20 | 30 days supply | Qty: 30 | Fill #2

## 2020-07-16 ENCOUNTER — Telehealth: Payer: Self-pay | Admitting: Internal Medicine

## 2020-07-16 NOTE — Telephone Encounter (Signed)
Copied from [place] [id number]. Topic: General - Other >> Jul 16, 2020 10:18 AM Yvette Rack wrote: Reason for CRM: Cedtoria with [place] called regarding authorization for patient. Cedtoria stated there is no documentation of the authorization so she will need the authorization number and expiration date. Cb# [id number]

## 2020-07-17 NOTE — Telephone Encounter (Signed)
Filled out prior auth form for pt CT and faxed form to [facility]   [id number] CT neck tissue w/ contrast ICD- R22.1 mass of left side of neck    Will wait for confirmation

## 2020-07-23 ENCOUNTER — Ambulatory Visit
Admission: RE | Admit: 2020-07-23 | Discharge: 2020-07-23 | Disposition: A | Discharge status: Home / Self Care | Payer: Medicaid Other | Source: Ambulatory Visit | Attending: Internal Medicine | Admitting: Internal Medicine

## 2020-07-23 DIAGNOSIS — R221 Localized swelling, mass and lump, neck: Secondary | ICD-10-CM

## 2020-07-23 MED ORDER — IOPAMIDOL (ISOVUE-300) INJECTION 61%
75.0000 mL | Freq: Once | INTRAVENOUS | Status: AC | PRN
  Administered 2020-07-23: 75 mL via INTRAVENOUS

## 2020-07-30 ENCOUNTER — Other Ambulatory Visit: Payer: Self-pay

## 2020-07-30 ENCOUNTER — Telehealth: Payer: Self-pay

## 2020-07-30 DIAGNOSIS — R221 Localized swelling, mass and lump, neck: Secondary | ICD-10-CM

## 2020-07-30 MED FILL — AMLODIPINE BESYLATE 10 MG T: 10 | 30 days supply | Qty: 30 | Fill #1

## 2020-07-30 NOTE — Telephone Encounter (Signed)
Contacted pt to go over CT results pt didn't answer lvm

## 2020-07-31 DIAGNOSIS — C099 Malignant neoplasm of tonsil, unspecified: Secondary | ICD-10-CM | POA: Diagnosis not present

## 2020-08-08 ENCOUNTER — Other Ambulatory Visit: Payer: Self-pay

## 2020-08-08 ENCOUNTER — Ambulatory Visit: Payer: Medicaid Other | Attending: Internal Medicine | Admitting: Internal Medicine

## 2020-08-08 DIAGNOSIS — Z1211 Encounter for screening for malignant neoplasm of colon: Secondary | ICD-10-CM

## 2020-08-08 DIAGNOSIS — J358 Other chronic diseases of tonsils and adenoids: Secondary | ICD-10-CM

## 2020-08-08 DIAGNOSIS — Z23 Encounter for immunization: Secondary | ICD-10-CM

## 2020-08-08 DIAGNOSIS — I10 Essential (primary) hypertension: Secondary | ICD-10-CM | POA: Diagnosis not present

## 2020-08-08 DIAGNOSIS — I2699 Other pulmonary embolism without acute cor pulmonale: Secondary | ICD-10-CM

## 2020-08-08 NOTE — Progress Notes (Signed)
Virtual Visit via Telephone Note  I connected with Mitchell Cooper on 08/08/20 at 9:56 a.m by telephone and verified that I am speaking with the correct person using two identifiers.  Location: Patient: home Provider: office  The patient, my CMA Ms. Sallyanne Havers and myself participated in this visit. I discussed the limitations, risks, security and privacy concerns of performing an evaluation and management service by telephone and the availability of in person appointments. I also discussed with the patient that there may be a patient responsible charge related to this service. The patient expressed understanding and agreed to proceed.   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  Lifelong anticoagulation recommended by cardiology, pulmonary and vascular).    Last evaluation 03/2020.  Purpose of today's visit is chronic disease management.  Visit was changed from in person to telephone visit by the practice due to inclement weather.  Since last visit with me, he had seen Dr. Leanne Chang last month with complaints of swelling on the left side of the neck.   CT scan revealed:  2.4 x 4.7 cm mass in the left oropharynx which appeared to be arising from the soft palate and extending inferiorly.  Findings compatible with carcinoma.  Large necrotic lymph node mass in the left neck compatible with metastatic disease.  Additional small ill-defined lymph nodes the left could be due to tumor as well.  Poor dentition. He has seen ENT specialist Dr. Redmond Baseman.  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 Pt states results not back yet.  PET scan ordered. Denies problems swallowing or SOB when laying down at nights -quit smoking again 3-4 mths ago.  HTN: reports BP has been stable but no device to check. Admits he does use use a little salt in foods. No CP/SOB. Some swelling in legs if he  stands or sits for long periods.  Hx of recurrent DVT/PE:  Compliant with Xarelto.  No bruising or bleeding.  HM:  Due for COVID booster. States he is afraid to get flu shot. Due for colon cancer screen.  No family hx of colonoscopy. Now has insurance Outpatient Encounter Medications as of 08/08/2020  Medication Sig   amLODipine (NORVASC) 10 MG tablet Take 1 tablet (10 mg total) by mouth daily.   rivaroxaban (XARELTO) 20 MG TABS tablet Take 1 tablet (20 mg total) by mouth daily with supper.   No facility-administered encounter medications on file as of 08/08/2020.      Observations/Objective: No direct observation done as this was a telephone encounter.  Assessment and Plan: 1. Essential hypertension Continue amlodipine. Patient to come in next week Wednesday to see clinical pharmacist for blood pressure check.  2. Recurrent pulmonary emboli ([facility]) Continue Xarelto. Continue to monitor for any bruising or bleeding.  3. Screening for colon cancer Discussed colon cancer screening methods. Now that he has insurance patient prefers to have colonoscopy done. - Ambulatory referral to Gastroenterology  4. Need for influenza vaccination Advised and encouraged him to get the flu shot and his COVID booster. When he comes next week to see the clinical pharmacist he will get his flu shot.  5. 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 Followed by Dr. Redmond Baseman. He will get the results of his pathology next week. Encouraged him to remain free of tobacco.   Follow Up Instructions: 4 months.   I discussed the assessment and treatment plan with  the patient. The patient was provided an opportunity to ask questions and all were answered. The patient agreed with the plan and demonstrated an understanding of the instructions.   The patient was advised to call back or seek an in-person evaluation if the symptoms worsen or if the condition fails to improve as anticipated.  I provided 11 minutes of  non-face-to-face time during this encounter.   Karle Plumber, MD

## 2020-08-11 NOTE — Progress Notes (Signed)
   S:    PCP: Dr. Wynetta Emery PMH: HTN, CKD stage 3, hx PE/DVT  Patient arrives in good spirits.  Presents to the clinic for hypertension evaluation, counseling, and management. Patient was referred and last seen by Primary Care Provider on 08/08/20 via telemedicine. No changes to medications.  Today, patient reports compliance with medications however, sometimes he misses AM dose of amlodipine and takes in PM instead.  He denies headache, dizziness, and blurred vision. He does not check his BP at home. He denies any changes to leg swelling. He admits to dietary indiscretion with salty foods, but is trying to make improvements. He has a plan to join a gym.  Medication adherence appears fair.  Current BP Medications include:  amlodipine 10 mg daily  Dietary habits include: veggies, rice, some fried foods Exercise habits include: none  Fhx: mother with CAD Tobacco use: former smoker (quit 2018, however quit smoking again quit smoking again 3-4 mths ago in 2021)  O:  Vitals:   08/13/20 1423  BP: (!) 151/102   Home BP readings: none  Last 3 Office BP readings: BP Readings from Last 3 Encounters:  08/13/20 (!) 151/102  07/09/20 (!) 129/95  03/31/20 (!) 133/93    BMET    Component Value Date/Time   NA 139 12/10/2019 1348   K 4.1 12/10/2019 1348   CL 104 12/10/2019 1348   CO2 21 12/10/2019 1348   GLUCOSE 99 12/10/2019 1348   GLUCOSE 115 (H) 11/06/2019 0231   BUN 17 12/10/2019 1348   CREATININE 1.07 12/10/2019 1348   CALCIUM 9.3 12/10/2019 1348   GFRNONAA 77 12/10/2019 1348   GFRAA 89 12/10/2019 1348    Renal function: CrCl cannot be calculated (Patient's most recent lab result is older than the maximum 21 days allowed.).  Clinical ASCVD: No  The ASCVD Risk score Mikey Bussing DC Jr., et al., 2013) failed to calculate for the following reasons:   Cannot find a previous HDL lab   Cannot find a previous total cholesterol lab  A/P: Hypertension longstanding currently uncontrolled on  current medications. BP Goal = < 130/80 mmHg. Pt will return on Friday 1/28 for labs. Encouraged patient to aim for a diet full of vegetables, fruit and lean meats (chicken, [place], fish) and to limit salt intake by eating fresh or frozen vegetables (instead of canned), rinse canned vegetables prior to cooking and do not add any additional salt to meals. Encouraged patient to exercise 20-30 minutes daily with the goal of 150 minutes per week. Patient verbalized understanding. - Initiate losartan 25 mg daily pending lab results  - Continue amlodipine 10 mg daily -F/u labs ordered - BMET (future) -Counseled on lifestyle modifications for blood pressure control including reduced dietary sodium, increased exercise, adequate sleep.  Health Maintenance - Due for vaccination against influenza. Consent given. Counseling provided. No contraindications exists.  -Flu vaccine administered without incident.   Results reviewed and written information provided. Total time in face-to-face counseling 30 minutes.   F/U Clinic Visit in 4 weeks.  Patient seen with:  Haynes Dage, PharmD Candidate  [employer] 2024    Lorel Monaco, PharmD, BCPS PGY2 Ambulatory Care Resident [place]

## 2020-08-13 ENCOUNTER — Ambulatory Visit: Payer: Medicaid Other | Attending: Internal Medicine | Admitting: Pharmacist

## 2020-08-13 ENCOUNTER — Other Ambulatory Visit: Payer: Self-pay

## 2020-08-13 VITALS — BP 151/102

## 2020-08-13 DIAGNOSIS — N1831 Chronic kidney disease, stage 3a: Secondary | ICD-10-CM

## 2020-08-13 DIAGNOSIS — I129 Hypertensive chronic kidney disease with stage 1 through stage 4 chronic kidney disease, or unspecified chronic kidney disease: Secondary | ICD-10-CM | POA: Diagnosis not present

## 2020-08-13 DIAGNOSIS — I10 Essential (primary) hypertension: Secondary | ICD-10-CM | POA: Diagnosis not present

## 2020-08-13 DIAGNOSIS — Z23 Encounter for immunization: Secondary | ICD-10-CM | POA: Diagnosis not present

## 2020-08-13 MED FILL — XARELTO 20 MG TABLET: 20 | 30 days supply | Qty: 30 | Fill #3

## 2020-08-15 ENCOUNTER — Other Ambulatory Visit (HOSPITAL_COMMUNITY): Payer: Self-pay | Admitting: Otolaryngology

## 2020-08-15 DIAGNOSIS — C7989 Secondary malignant neoplasm of other specified sites: Secondary | ICD-10-CM

## 2020-08-15 DIAGNOSIS — C099 Malignant neoplasm of tonsil, unspecified: Secondary | ICD-10-CM

## 2020-08-27 ENCOUNTER — Other Ambulatory Visit: Payer: Self-pay

## 2020-08-27 ENCOUNTER — Encounter (HOSPITAL_COMMUNITY)
Admission: RE | Admit: 2020-08-27 | Discharge: 2020-08-27 | Disposition: A | Discharge status: Home / Self Care | Payer: Medicaid Other | Source: Ambulatory Visit | Attending: Otolaryngology | Admitting: Otolaryngology

## 2020-08-27 DIAGNOSIS — C099 Malignant neoplasm of tonsil, unspecified: Secondary | ICD-10-CM | POA: Insufficient documentation

## 2020-08-27 DIAGNOSIS — C7989 Secondary malignant neoplasm of other specified sites: Secondary | ICD-10-CM | POA: Insufficient documentation

## 2020-08-27 LAB — GLUCOSE, CAPILLARY: Glucose-Capillary: 107 mg/dL — ABNORMAL HIGH (ref 70–99)

## 2020-08-27 MED ORDER — FLUDEOXYGLUCOSE F - 18 (FDG) INJECTION
10.0000 | Freq: Once | INTRAVENOUS | Status: AC | PRN
  Administered 2020-08-27: 10.95 via INTRAVENOUS

## 2020-09-01 ENCOUNTER — Telehealth: Payer: Self-pay | Admitting: Hematology and Oncology

## 2020-09-01 NOTE — Telephone Encounter (Signed)
Received a new pt referral from Dr. Redmond Baseman for tonsil cancer. Pt has been cld and scheduled to see Dr. Chryl Heck on 2/16 at 2:20pm. Pt aware to arrive 20 minutes early.

## 2020-09-03 ENCOUNTER — Inpatient Hospital Stay: Payer: Medicaid Other

## 2020-09-03 ENCOUNTER — Inpatient Hospital Stay: Payer: Medicaid Other | Attending: Hematology and Oncology | Admitting: Hematology and Oncology

## 2020-09-03 ENCOUNTER — Encounter: Payer: Self-pay | Admitting: Hematology and Oncology

## 2020-09-03 ENCOUNTER — Other Ambulatory Visit: Payer: Self-pay

## 2020-09-03 VITALS — BP 153/93 | HR 83 | Temp 97.3°F | Wt 217.0 lb

## 2020-09-03 DIAGNOSIS — Z86718 Personal history of other venous thrombosis and embolism: Secondary | ICD-10-CM | POA: Diagnosis not present

## 2020-09-03 DIAGNOSIS — I10 Essential (primary) hypertension: Secondary | ICD-10-CM | POA: Diagnosis not present

## 2020-09-03 DIAGNOSIS — Z86711 Personal history of pulmonary embolism: Secondary | ICD-10-CM | POA: Diagnosis not present

## 2020-09-03 DIAGNOSIS — Z7901 Long term (current) use of anticoagulants: Secondary | ICD-10-CM | POA: Diagnosis not present

## 2020-09-03 DIAGNOSIS — C099 Malignant neoplasm of tonsil, unspecified: Secondary | ICD-10-CM | POA: Diagnosis not present

## 2020-09-03 DIAGNOSIS — Z8249 Family history of ischemic heart disease and other diseases of the circulatory system: Secondary | ICD-10-CM | POA: Diagnosis not present

## 2020-09-03 DIAGNOSIS — Z87891 Personal history of nicotine dependence: Secondary | ICD-10-CM | POA: Insufficient documentation

## 2020-09-03 DIAGNOSIS — I8290 Acute embolism and thrombosis of unspecified vein: Secondary | ICD-10-CM

## 2020-09-03 DIAGNOSIS — Z79899 Other long term (current) drug therapy: Secondary | ICD-10-CM | POA: Insufficient documentation

## 2020-09-03 DIAGNOSIS — Z5111 Encounter for antineoplastic chemotherapy: Secondary | ICD-10-CM

## 2020-09-03 DIAGNOSIS — Z5181 Encounter for therapeutic drug level monitoring: Secondary | ICD-10-CM

## 2020-09-03 DIAGNOSIS — Z713 Dietary counseling and surveillance: Secondary | ICD-10-CM

## 2020-09-03 LAB — CBC WITH DIFFERENTIAL/PLATELET
Abs Immature Granulocytes: 0.03 10*3/uL (ref 0.00–0.07)
Basophils Absolute: 0.1 10*3/uL (ref 0.0–0.1)
Basophils Relative: 1 %
Eosinophils Absolute: 0.3 10*3/uL (ref 0.0–0.5)
Eosinophils Relative: 5 %
HCT: 44.9 % (ref 39.0–52.0)
Hemoglobin: 14.8 g/dL (ref 13.0–17.0)
Immature Granulocytes: 1 %
Lymphocytes Relative: 19 %
Lymphs Abs: 1.2 10*3/uL (ref 0.7–4.0)
MCH: 26.7 pg (ref 26.0–34.0)
MCHC: 33 g/dL (ref 30.0–36.0)
MCV: 81 fL (ref 80.0–100.0)
Monocytes Absolute: 0.8 10*3/uL (ref 0.1–1.0)
Monocytes Relative: 12 %
Neutro Abs: 4 10*3/uL (ref 1.7–7.7)
Neutrophils Relative %: 62 %
Platelets: 251 10*3/uL (ref 150–400)
RBC: 5.54 MIL/uL (ref 4.22–5.81)
RDW: 15.2 % (ref 11.5–15.5)
WBC: 6.3 10*3/uL (ref 4.0–10.5)
nRBC: 0 % (ref 0.0–0.2)

## 2020-09-03 LAB — CMP (CANCER CENTER ONLY)
ALT: 12 U/L (ref 0–44)
AST: 12 U/L — ABNORMAL LOW (ref 15–41)
Albumin: 3.6 g/dL (ref 3.5–5.0)
Alkaline Phosphatase: 72 U/L (ref 38–126)
Anion gap: 9 (ref 5–15)
BUN: 14 mg/dL (ref 6–20)
CO2: 25 mmol/L (ref 22–32)
Calcium: 9.3 mg/dL (ref 8.9–10.3)
Chloride: 106 mmol/L (ref 98–111)
Creatinine: 1.17 mg/dL (ref 0.61–1.24)
GFR, Estimated: >60 mL/min
Glucose, Bld: 97 mg/dL (ref 70–99)
Potassium: 4.2 mmol/L (ref 3.5–5.1)
Sodium: 140 mmol/L (ref 135–145)
Total Bilirubin: 0.4 mg/dL (ref 0.3–1.2)
Total Protein: 7.9 g/dL (ref 6.5–8.1)

## 2020-09-03 MED ORDER — LIDOCAINE-PRILOCAINE 2.5-2.5 % EX CREA: TOPICAL_CREAM | CUTANEOUS | 3 refills | Status: DC

## 2020-09-03 MED ORDER — ONDANSETRON HCL 8 MG PO TABS: 8.0000 mg | ORAL_TABLET | Freq: Two times a day (BID) | ORAL | 1 refills | Status: DC | PRN

## 2020-09-03 MED ORDER — PROCHLORPERAZINE MALEATE 10 MG PO TABS: 10.0000 mg | ORAL_TABLET | Freq: Four times a day (QID) | ORAL | 1 refills | Status: DC | PRN

## 2020-09-03 MED ORDER — LORAZEPAM 0.5 MG PO TABS: 0.5000 mg | ORAL_TABLET | Freq: Four times a day (QID) | ORAL | 0 refills | Status: DC | PRN

## 2020-09-03 MED ORDER — DEXAMETHASONE 4 MG PO TABS: 8.0000 mg | ORAL_TABLET | Freq: Every day | ORAL | 1 refills | Status: DC

## 2020-09-03 NOTE — Progress Notes (Signed)
Oncology Nurse Navigator Documentation  Met with patient during initial consult with Mr. Mitchell Cooper.  He was accompanied by his daughter and wife.  I Introduced myself as Psychologist, counselling, explained my role as a member of the Care Team. . Assisted with post-consult appt scheduling. . They verbalized understanding of information provided. I provided them with my card and encouraged them to call with questions/concerns moving forward.   Harlow Asa, RN, BSN, OCN Head & Neck Oncology Nurse [place] at [place] [phone]

## 2020-09-03 NOTE — Progress Notes (Signed)
[facility] NOTE  Patient Care Team: Ladell Pier, MD as PCP - General (Internal Medicine)  CHIEF COMPLAINTS/PURPOSE OF CONSULTATION:  New diagnosis of left tonsillar cancer.  ASSESSMENT & PLAN:  No problem-specific Assessment & Plan notes found for this encounter.  No orders of the defined types were placed in this encounter.  1. SCC left tonsil, T2 N3 M0/ Stage III, P16 positive We discussed about chemoradiation as the preferred option in this setting vs resection. He already met with Dr Redmond Baseman who suggested proceed with chemoradiation according to the patient. We will proceed with weekly cisplatin along with radiation given his good PS. Discussed about the schedule, adverse effects including but not limited to fatigue, nausea, vomiting, mucositis diarrhea, increased risk of infections, ototoxicity, nephrotoxicity, neuropathy.  He understands that some of the side effects can be permanent and fatal due to chemotherapy.  2.  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  I do not believe there is a need for hypercoagulable work-up at this time since this would not change management.  3.  Nutrition counseling, discussed that mucositis is a common side effect with concurrent chemoradiation and can lead to rapid weight loss given difficulty swallowing.  Advised to consider prophylactic G-tube placement to facilitate nutrition during the treatment.  Nutrition referral will be placed.  4.  Port placement for chemotherapy administration, he appreciates this since he does have vasovagal episodes with blood draws. Radiology to discuss the exact risks with this procedure.   His last PE was sub massive back in April 2021. May have to bridge with lovenox for port placement since last PE was less than a yr ago.  HISTORY OF PRESENTING  ILLNESS:  Mitchell Cooper 60 y.o. male is here because of new diagnosis of tonsil cancer  Mitchell Cooper is a 60 y.o. male who presents as a consultation at the request of Dr.Bates with new diagnosis of poorly differentiated carcinoma noted on tonsilar biopsy. He initially presented with a chief complaint of neck mass going on for more than one year, care delayed partly by the pandemic. His neck mass continued to progress in size. He otherwise denied any dysphagia, speech issues, otalgia or weight loss He had two episodes of PE in 2018 and 2021 and is on indefinite anticoagulation with xarelto. According to patient there was no clear etiology for these PE  Except for long periods of immobilization for work. He otherwise has HTN, well controlled on norvasc He is not a heavy smoker, smoked a pack in a week or so, quit smoking in 2018, resumed in 2021. He was a Architect worked by occupation.  Imaging and work up done so far.  07/23/2020  IMPRESSION: 1. 2.4 x 4.7 cm mass in the left oropharynx which appears to be arising from the soft palate extending inferiorly. Findings compatible with carcinoma. 2. Large necrotic lymph node mass in the left neck compatible with metastatic disease. Additional small ill-defined lymph nodes the left could be due to tumor as well. 3. Poor dentition.  08/27/2020  Final Pathologic Diagnosis    A.  LEFT TONSIL, BIOPSY:              Poorly differentiated malignant neoplasm.              See comment.   Electronically signed by Lowella Bandy, MD on  08/13/2020 at 12:10 PM  Comment    This biopsy show the presence of a poorly differentiated malignant neoplasm located at the subepithelial stroma.  Immunostains with appropriate controls show that tumor cells are positive for vimentin and p16, and negative for pancytokeratin (AE1/3), low molecular weight cytokeratin, CK5/6, pan melanoma markers, SOX10, S100, desmin, CD34, CD30  and p40.  CD45, CD3 and CD20 are  positive in some background lymphocytes and the tumor cells appear to be negative.  Ki67 is positive in about 40% of the tumor cells.  The findings are consistent with a poorly differentiated malignant neoplasm of undetermined classification.  This tumor shows negative reaction for multiple keratin markers, but a very poorly differentiated carcinoma still can not be entirely ruled.  Because of the negative reaction for multiple markers of melanoma, a malignant melanoma is less likely.  Dr. Sherran Needs also reviewed this case, and believes that the findings in this biopsy are not diagnostic of lymphoma.  A malignant mesenchymal neoplasm is possible, but other types of malignant neoplasm can not be ruled out.  Since this is a relatively small biopsy, additional sampling of the lesion may probably provide additional findings to help further classification of this tumor.  Clinical correlation is recommended.    Dr. Tommas Olp reviewed this case and agrees with this diagnosis.    08/27/2020  IMPRESSION: 1. Left tonsillar mass is hypermetabolic and consistent with neoplasm. 2. Bulky necrotic left metastatic nodal disease. 3. No findings for metastatic disease involving the chest, abdomen/pelvis or bony structures.   REVIEW OF SYSTEMS:   Constitutional: Denies fevers, chills or abnormal night sweats Eyes: Denies blurriness of vision, double vision or watery eyes Ears, nose, mouth, throat, and face: Denies mucositis or sore throat Respiratory: Denies cough, dyspnea or wheezes Cardiovascular: Denies palpitation, chest discomfort or lower extremity swelling Gastrointestinal:  Denies nausea, heartburn or change in bowel habits Skin: Denies abnormal skin rashes Lymphatics: Denies new lymphadenopathy or easy bruising Neurological:Denies numbness, tingling or new weaknesses Behavioral/Psych: Mood is stable, no new changes  All other systems were reviewed with the patient and are negative.  MEDICAL HISTORY:   Past Medical History:  Diagnosis Date  . DVT (deep venous thrombosis) ([place])   . Hypertension 1997   has never taken medications  . Tobacco dependence     SURGICAL HISTORY: Past Surgical History:  Procedure Laterality Date  . KNEE SURGERY  approx. 1984   reconstructive surgery of left knee; arthroscopic procedure about 1997  . PERIPHERAL VASCULAR CATHETERIZATION Left 06/16/2016   Procedure: Lower Extremity Venography- Venus Thrombolysis;  Surgeon: Serafina Mitchell, MD;  Location: [facility];  Service: Cardiovascular;  Laterality: Left;    SOCIAL HISTORY: Social History   Socioeconomic History  . Marital status: Married    Spouse name: Not on file  . Number of children: Not on file  . Years of education: Not on file  . Highest education level: Not on file  Occupational History  . Occupation: Architect  Tobacco Use  . Smoking status: Former Smoker    Types: Cigarettes    Quit date: 11/21/2016    Years since quitting: 3.7  . Smokeless tobacco: Never Used  Vaping Use  . Vaping Use: Never used  Substance and Sexual Activity  . Alcohol use: Yes    Alcohol/week: 6.0 standard drinks    Types: 6 Cans of beer per week  . Drug use: No  . Sexual activity: Not on file  Other Topics Concern  . Not  on file  Social History Narrative  . Not on file   Social Determinants of Health   Financial Resource Strain: Not on file  Food Insecurity: Not on file  Transportation Needs: No Transportation Needs  . Lack of Transportation (Medical): No  . Lack of Transportation (Non-Medical): No  Physical Activity: Inactive  . Days of Exercise per Week: 0 days  . Minutes of Exercise per Session: 0 min  Stress: Not on file  Social Connections: Not on file  Intimate Partner Violence: Not on file    FAMILY HISTORY: Family History  Problem Relation Age of Onset  . CAD Mother 66    ALLERGIES:  has No Known Allergies.  MEDICATIONS:  Current Outpatient Medications  Medication  Sig Dispense Refill  . amLODipine (NORVASC) 10 MG tablet Take 1 tablet (10 mg total) by mouth daily. 30 tablet 6  . rivaroxaban (XARELTO) 20 MG TABS tablet Take 1 tablet (20 mg total) by mouth daily with supper. 30 tablet 6   No current facility-administered medications for this visit.     PHYSICAL EXAMINATION: ECOG PERFORMANCE STATUS: 0 - Asymptomatic  There were no vitals filed for this visit. There were no vitals filed for this visit.  GENERAL:alert, no distress and comfortable SKIN: skin color, texture, turgor are normal, no rashes or significant lesions EYES: normal, conjunctiva are pink and non-injected, sclera clear OROPHARYNX:no exudate, no erythema and lips, buccal mucosa, and tongue normal  NECK: supple, thyroid normal size, non-tender, without nodularity LYMPH:  no palpable lymphadenopathy in the cervical, axillary or inguinal LUNGS: clear to auscultation and percussion with normal breathing effort HEART: regular rate & rhythm and no murmurs and no lower extremity edema ABDOMEN:abdomen soft, non-tender and normal bowel sounds Musculoskeletal:no cyanosis of digits and no clubbing  PSYCH: alert & oriented x 3 with fluent speech NEURO: no focal motor/sensory deficits  LABORATORY DATA:  I have reviewed the data as listed Lab Results  Component Value Date   WBC 7.6 12/10/2019   HGB 14.9 12/10/2019   HCT 44.5 12/10/2019   MCV 80 12/10/2019   PLT 209 12/10/2019     Chemistry      Component Value Date/Time   NA 139 12/10/2019 1348   K 4.1 12/10/2019 1348   CL 104 12/10/2019 1348   CO2 21 12/10/2019 1348   BUN 17 12/10/2019 1348   CREATININE 1.07 12/10/2019 1348      Component Value Date/Time   CALCIUM 9.3 12/10/2019 1348   ALKPHOS 61 11/04/2019 1426   AST 46 (H) 11/04/2019 1426   ALT 31 11/04/2019 1426   BILITOT 1.4 (H) 11/04/2019 1426       RADIOGRAPHIC STUDIES: I have personally reviewed the radiological images as listed and agreed with the findings in  the report. NM PET Image Initial (PI) Skull Base To Thigh  Result Date: 08/27/2020 CLINICAL DATA:  Initial treatment strategy for left tonsillar carcinoma. EXAM: NUCLEAR MEDICINE PET SKULL BASE TO THIGH TECHNIQUE: 10.95 mCi F-18 FDG was injected intravenously. Full-ring PET imaging was performed from the skull base to thigh after the radiotracer. CT data was obtained and used for attenuation correction and anatomic localization. Fasting blood glucose: 107 mg/dl COMPARISON:  Neck CT 07/23/2020 FINDINGS: Mediastinal blood pool activity: SUV max 2.77 Liver activity: SUV max NA NECK: Large left tonsillar mass appears to involve the uvula. SUV max is 10.37. Extensive necrotic left neck adenopathy is also hypermetabolic with SUV max of 2.37. Smaller posterior cervical lymph nodes are also mildly hypermetabolic  with SUV max of 3.33. Incidental CT findings: none CHEST: No hypermetabolic mediastinal or hilar nodes. No suspicious pulmonary nodules on the CT scan. Incidental CT findings: none ABDOMEN/PELVIS: No abnormal hypermetabolic activity within the liver, pancreas, adrenal glands, or spleen. No hypermetabolic lymph nodes in the abdomen or pelvis. Incidental CT findings: Low-attenuation adrenal gland lesions consistent with benign adenomas. Scattered atherosclerotic calcifications. A left common iliac vein stent is noted. SKELETON: No focal hypermetabolic activity to suggest skeletal metastasis. Incidental CT findings: none IMPRESSION: 1. Left tonsillar mass is hypermetabolic and consistent with neoplasm. 2. Bulky necrotic left metastatic nodal disease. 3. No findings for metastatic disease involving the chest, abdomen/pelvis or bony structures. Electronically Signed   By: Marijo Sanes M.D.   On: 08/27/2020 16:28   I independently reviewed PET imaging from outside facility  All questions were answered. The patient knows to call the clinic with any problems, questions or concerns. I spent over 60 minutes in the  care of this patient including H and P, review of records, counseling and coordination of care.     Benay Pike, MD 09/03/2020 2:47 PM

## 2020-09-03 NOTE — Progress Notes (Signed)
START ON PATHWAY REGIMEN - Head and Neck     A cycle is every 7 days:     Cisplatin   **Always confirm dose/schedule in your pharmacy ordering system**  Patient Characteristics: Oropharynx, HPV Positive, Preoperative or Nonsurgical Candidate (Clinical Staging), cT0-4, cN1-3 or cT3-4, cN0 Disease Classification: Oropharynx HPV Status: Positive (+) Therapeutic Status: Preoperative or Nonsurgical Candidate (Clinical Staging) AJCC T Category: cT2 AJCC 8 Stage Grouping: III AJCC N Category: cN3 AJCC M Category: cM0 Intent of Therapy: Curative Intent, Discussed with Patient 

## 2020-09-05 ENCOUNTER — Telehealth: Payer: Self-pay

## 2020-09-05 ENCOUNTER — Other Ambulatory Visit: Payer: Self-pay

## 2020-09-05 ENCOUNTER — Telehealth: Payer: Self-pay | Admitting: Hematology and Oncology

## 2020-09-05 DIAGNOSIS — I8290 Acute embolism and thrombosis of unspecified vein: Secondary | ICD-10-CM

## 2020-09-05 MED ORDER — ENOXAPARIN SODIUM 100 MG/ML ~~LOC~~ SOLN: 100.0000 mg | Freq: Two times a day (BID) | SUBCUTANEOUS | 0 refills | Status: DC

## 2020-09-05 MED ORDER — ENOXAPARIN SODIUM 100 MG/ML ~~LOC~~ SOLN
100.0000 mg | Freq: Two times a day (BID) | SUBCUTANEOUS | 0 refills | Status: DC
Start: 2020-09-09 — End: 2020-10-16

## 2020-09-05 NOTE — Telephone Encounter (Signed)
Per Dr. Chryl Heck, I called the patient to inform him that he will need to stop his Xarelto on 2/22. He is aware that he will begin his Lovenox injections on the morning and evening of 2/22 and then finish one last dose the morning of 2/23. Patient was advised to resume his Xarelto on 2/24. I went over patient education information about bridging from Xarelto to Lovenox and went over the instructions on how to administer the Lovenox. The patient verbalized understanding. I advised the patient to contact us with any questions or concerns regarding this.

## 2020-09-05 NOTE — Telephone Encounter (Signed)
-----   Message from Ernst Spell, RN sent at 09/05/2020  3:39 PM EST ----- Regarding: RE: PEG/PAC placement Myriam Jacobson- Will you call him and let him know that he doesn't need to use the lovenox and only needs to hold his Xarelto on 2/23 (& 2/24 morning).   Let me know if you can't and I am happy to do that.  Anderson Malta  ----- Message ----- From: Benay Pike, MD Sent: 09/05/2020   2:40 PM EST To: Ernst Spell, RN, Burna Mortimer, CMA Subject: RE: PEG/PAC placement                          If its just one day, no bridging needed.  Thanks, ----- Message ----- From: Ernst Spell, RN Sent: 09/05/2020   1:47 PM EST To: Benay Pike, MD, Burna Mortimer, CMA Subject: PEG/PAC placement                              Dr. Chryl Heck & Myriam Jacobson-  I just spoke with IR to get Mr. Louque's PEG/PAC scheduled. He's scheduled for 2/24 at 8:00 am.  IR told me that he would only need to hold his Xarelto for one day per their policy. Do you still want to bridge him with Lovenox?  Thanks, [employer]

## 2020-09-05 NOTE — Telephone Encounter (Signed)
Scheduled appointments per 2/16 los. Called patient, no answer. Left message with appointments dates and times.  

## 2020-09-05 NOTE — Telephone Encounter (Signed)
Called pharmacy to cancel Lovenox.  Patient is aware that he does not need to bridge with Lovenox now and verbalized understanding to [place] on 2/23 and 2/24.

## 2020-09-08 ENCOUNTER — Encounter: Payer: Self-pay | Admitting: Hematology and Oncology

## 2020-09-08 ENCOUNTER — Telehealth: Payer: Self-pay | Admitting: *Deleted

## 2020-09-08 NOTE — Progress Notes (Addendum)
°Radiation Oncology         [phone] °________________________________ ° °Initial Outpatient Consultation ° °Name: Mitchell Cooper MRN: [id number]  °Date: 09/09/2020  DOB: 12/29/1960 ° °CC:Johnson, Deborah B, MD  Bates, Dwight, MD  ° °REFERRING PHYSICIAN: Bates, Dwight, MD ° °DIAGNOSIS: C09.0 ° °  ICD-10-CM   °1. 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 Carcinoma of tonsillar fossa (HCC)  C09.0   ° °Cancer Staging °Squamous cell carcinoma of left tonsil (HCC) °Staging form: Pharynx - HPV-Mediated Oropharynx, AJCC 8th Edition °- Clinical stage from 09/09/2020: Stage III (cT3, cN3, cM0, p16+) - Signed by , , MD on 09/12/2020 °Stage prefix: Initial diagnosis ° ° °CHIEF COMPLAINT: Here to discuss management of left tonsillar cancer ° °HISTORY OF PRESENT ILLNESS::Mitchell Cooper is a 59 y.o. male who presented with one year history of enlarging left neck mass.  He had a delay in care partly due to lack of insurance.  He did not develop any dysphagia, speech issues, ear pain, or weight loss. ° °Pertinent imaging thus far includes soft tissue neck CT scan performed on 07/23/2020 revealing a 2.4 x 4.7 cm mass in the left oropharynx which appeared to be arising from the soft palate extending inferiorly. Findings were compatible with carcinoma. There was also noted to be a large necrotic lymph node mass in the left neck that was compatible with metastatic disease. Additionally small ill-defined lymph nodes on the left could be due to tumor as well.  I have personally reviewed his imaging. ° °Biopsy of the left tonsil on 07/31/2020 revealed poorly differentiated malignant neoplasm of undetermined classification.  p16(+) °  °Biopsies revealed:  °07/31/2020 °A.  LEFT TONSIL, BIOPSY: °             Poorly differentiated malignant neoplasm. °             See comment. °Comment   °--This biopsy show the presence of a poorly differentiated malignant neoplasm located at the subepithelial stroma.   Immunostains with appropriate controls show that tumor cells are positive for vimentin and p16, and negative for pancytokeratin (AE1/3), low molecular weight cytokeratin, CK5/6, pan melanoma markers, SOX10, S100, desmin, CD34, CD30  and p40.  CD45, CD3 and CD20 are positive in some background lymphocytes and the tumor cells appear to be negative.  Ki67 is positive in about 40% of the tumor cells. °--The findings are consistent with a poorly differentiated malignant neoplasm of undetermined classification.  This tumor shows negative reaction for multiple keratin markers, but a very poorly differentiated carcinoma still can not be entirely ruled.  Because of the negative reaction for multiple markers of melanoma, a malignant melanoma is less likely.  Dr. Hsi also reviewed this case, and believes that the findings in this biopsy are not diagnostic of lymphoma.  A malignant mesenchymal neoplasm is possible, but other types of malignant neoplasm can not be ruled out. °--Since this is a relatively small biopsy, additional sampling of the lesion may probably provide additional findings to help further classification of this tumor.  Clinical correlation is recommended ° ° °PET Scan, also personally reviewed by me °08/27/2020 °IMPRESSION: °1. Left tonsillar mass is hypermetabolic and consistent with °neoplasm. °2. Bulky necrotic left metastatic nodal disease. °3. No findings for metastatic disease involving the chest, °abdomen/pelvis or bony structures. ° °(to my eye, he also has contralateral but much smaller adenopathy, right neck) °  ° °Nutrition Status Yes No Comments  °Weight changes? [] [x]   °  Swallowing concerns? [] [x]   °PEG? [x] [] Getting PEG (and PAC) placed on 09/11/2020  ° °Referrals Yes No Comments  °Social Work? [x] []   °Dentistry? [x] [] Scheduled to meet with Dr. Madison Owsley on 09/11/2020  °Swallowing therapy? [x] []   °Nutrition? [x] []   °Med/Onc? [x] [] Met with Dr. Praveena Iruku on 09/03/2020   ° °Safety Issues Yes No Comments  °Prior radiation? [] [x]   °Pacemaker/ICD? [] [x]   °Possible current pregnancy? [] [x] N/A  °Is the patient on methotrexate? [] [x]   ° °Tobacco/Marijuana/Snuff/ETOH use: Reports occasional beer consumption. Quit smoking 2018, but has had about 4 cigarettes in the past 2 weeks. No longer using smokeless tobacco and denies any recreational drug use °  °Past/Anticipated interventions by medical oncology, if any:  °Under care of Dr. Praveena Iruku °09/03/2020 °1. SCC left tonsil, T2 N3 M0/ Stage III, P16 positive °--We discussed about chemoradiation as the preferred option in this setting vs resection. °--He already met with Dr Bates who suggested proceed with chemoradiation according to the patient. °--We will proceed with weekly cisplatin along with radiation given his good PS. °--Discussed about the schedule, adverse effects including but not limited to fatigue, nausea, vomiting, mucositis diarrhea, increased risk of infections, ototoxicity, nephrotoxicity, neuropathy.  He understands that some of the side effects can be permanent and fatal due to chemotherapy. °2.  History of PE without any clear etiology in 2018 and 2020 °--Despite the cause, given 2 instances of PE, I think it is reasonable to consider indefinite anticoagulation at this time.   °--I do not believe there is a need for hypercoagulable work-up at this time since this would not change management. °3.  Nutrition counseling, discussed that mucositis is a common side effect with concurrent chemoradiation and can lead to rapid weight loss given difficulty swallowing.  °--Advised to consider prophylactic G-tube placement to facilitate nutrition during the treatment.  Nutrition referral will be placed. °4.  Port placement for chemotherapy administration, he appreciates this since he does have vasovagal episodes with blood draws. °--Radiology to discuss the exact risks with this procedure.   °--His last PE was sub massive  back in April 2021. °--May have to bridge with lovenox for port placement since last PE was less than a yr ago. ° °Current Complaints / other details:  Patient has received both Pfizer vaccines and annual flu shot ° °  °  °PREVIOUS RADIATION THERAPY: No ° °PAST MEDICAL HISTORY:  has a past medical history of DVT (deep venous thrombosis) (HCC), Hypertension (1997), and Tobacco dependence.   ° °PAST SURGICAL HISTORY: °Past Surgical History:  °Procedure Laterality Date  °• IR GASTROSTOMY TUBE MOD SED  09/11/2020  °• IR IMAGING GUIDED PORT INSERTION  09/11/2020  °• KNEE SURGERY  approx. 1984  ° reconstructive surgery of left knee; arthroscopic procedure about 1997  °• PERIPHERAL VASCULAR CATHETERIZATION Left 06/16/2016  ° Procedure: Lower Extremity Venography- Venus Thrombolysis;  Surgeon: Vance W Brabham, MD;  Location: [facility];  Service: Cardiovascular;  Laterality: Left;  ° ° °FAMILY HISTORY: family history includes CAD (age of onset: 69) in his mother. ° °SOCIAL HISTORY:  reports that he has been smoking cigarettes. He has never used smokeless tobacco. He reports current alcohol use of about 6.0 standard drinks of alcohol per week. He reports that he does not use drugs. ° °ALLERGIES: Patient has no known allergies. ° °MEDICATIONS:  °Current Outpatient Medications  °Medication Sig Dispense Refill  °•   oxyCODONE-acetaminophen (PERCOCET/ROXICET) 5-325 MG tablet Take 1 tablet by mouth every 6 (six) hours as needed for moderate pain or severe pain. 16 tablet 0  °• amLODipine (NORVASC) 10 MG tablet Take 1 tablet (10 mg total) by mouth daily. 30 tablet 6  °• dexamethasone (DECADRON) 4 MG tablet Take 2 tablets (8 mg total) by mouth daily. Take daily x 3 days starting the day after cisplatin chemotherapy. Take with food. 30 tablet 1  °• enoxaparin (LOVENOX) 100 MG/ML injection Inject 1 mL (100 mg total) into the skin 2 (two) times daily for 2 days. 3 mL 0  °• lidocaine-prilocaine (EMLA) cream Apply to affected area  once 30 g 3  °• LORazepam (ATIVAN) 0.5 MG tablet Take 1 tablet (0.5 mg total) by mouth every 6 (six) hours as needed (Nausea or vomiting). 30 tablet 0  °• ondansetron (ZOFRAN) 8 MG tablet Take 1 tablet (8 mg total) by mouth 2 (two) times daily as needed. Start on the third day after cisplatin chemotherapy. 30 tablet 1  °• prochlorperazine (COMPAZINE) 10 MG tablet Take 1 tablet (10 mg total) by mouth every 6 (six) hours as needed (Nausea or vomiting). 30 tablet 1  °• rivaroxaban (XARELTO) 20 MG TABS tablet Take 1 tablet (20 mg total) by mouth daily with supper. 30 tablet 6  ° °No current facility-administered medications for this encounter.  ° ° °REVIEW OF SYSTEMS:  Notable for that above. °  °PHYSICAL EXAM:  weight is 220 lb 12.8 oz (100.2 kg). His temperature is 99.3 °F (37.4 °C). His blood pressure is 137/84 and his pulse is 99. His respiration is 20 and oxygen saturation is 99%.   °General: Alert and oriented, in no acute distress °HEENT: Head is normocephalic. Extraocular movements are intact. Oropharynx is notable for bulky left tonsillar tumor, exophytic, involving soft palate, reaching midline. °Neck: Neck is notable for bulky mass at level 2/ 3 of left neck, at least 6 cm in size.  No skin invasion.  No palpable adenopathy elsewhere. °Heart: Regular in rate and rhythm with no murmurs, rubs, or gallops. °Chest: Clear to auscultation bilaterally, with no rhonchi, wheezes, or rales. °Abdomen: Soft, nontender, nondistended, with no rigidity or guarding. °Extremities: Mild ankle edema °Lymphatics: see Neck Exam °Skin: No concerning lesions. °Musculoskeletal: symmetric strength and muscle tone throughout. °Neurologic: Cranial nerves II through XII are grossly intact. No obvious focalities. Speech is fluent. Coordination is intact. °Psychiatric: Judgment and insight are intact. Affect is appropriate. ° ° °ECOG = 0 ° °0 - Asymptomatic (Fully active, able to carry on all predisease activities without  restriction) ° °1 - Symptomatic but completely ambulatory (Restricted in physically strenuous activity but ambulatory and able to carry out work of a light or sedentary nature. For example, light housework, office work) ° °2 - Symptomatic, <50% in bed during the day (Ambulatory and capable of all self care but unable to carry out any work activities. Up and about more than 50% of waking hours) ° °3 - Symptomatic, >50% in bed, but not bedbound (Capable of only limited self-care, confined to bed or chair 50% or more of waking hours) ° °4 - Bedbound (Completely disabled. Cannot carry on any self-care. Totally confined to bed or chair) ° °5 - Death ° ° Oken MM, Creech RH, Tormey DC, et al. (1982). "Toxicity and response criteria of the Eastern Cooperative Oncology Group". Am. J. Clin. Oncol. 5 (6): 649-55 ° ° °LABORATORY DATA:  °Lab Results  °Component Value Date  ° WBC 5.6   09/11/2020   HGB 14.8 09/11/2020   HCT 44.5 09/11/2020   MCV 81.1 09/11/2020   PLT 227 09/11/2020   CMP     Component Value Date/Time   NA 140 09/03/2020 1534   NA 139 12/10/2019 1348   K 4.2 09/03/2020 1534   CL 106 09/03/2020 1534   CO2 25 09/03/2020 1534   GLUCOSE 97 09/03/2020 1534   BUN 14 09/03/2020 1534   BUN 17 12/10/2019 1348   CREATININE 1.17 09/03/2020 1534   CALCIUM 9.3 09/03/2020 1534   PROT 7.9 09/03/2020 1534   ALBUMIN 3.6 09/03/2020 1534   AST 12 (L) 09/03/2020 1534   ALT 12 09/03/2020 1534   ALKPHOS 72 09/03/2020 1534   BILITOT 0.4 09/03/2020 1534   GFRNONAA >60 09/03/2020 1534   GFRAA 89 12/10/2019 1348      No results found for: TSH   RADIOGRAPHY: IR Gastrostomy Tube  Result Date: 09/11/2020 INDICATION: 60 year old with squamous cell carcinoma of the left tonsil. Request for a gastrostomy tube prior to chemotherapy and radiation treatment. EXAM: PERCUTANEOUS GASTROSTOMY TUBE WITH FLUOROSCOPIC GUIDANCE Physician: Stephan Minister. Anselm Pancoast, MD MEDICATIONS: Ancef 2 g; Antibiotics were administered within 1 hour of  the procedure. Glucagon 1 mg IV ANESTHESIA/SEDATION: Versed 1.0 mg IV; Fentanyl 100 mcg IV Moderate Sedation Time:  20 minutes The patient was continuously monitored during the procedure by the interventional radiology nurse under my direct supervision. FLUOROSCOPY TIME:  Fluoroscopy Time: 2 minutes, 42 seconds, 69 mGy COMPLICATIONS: None immediate. PROCEDURE: The procedure was explained to the patient. The risks and benefits of the procedure were discussed and the patient's questions were addressed. Informed consent was obtained from the patient. The patient was placed on the interventional table. Fluoroscopy demonstrated oral contrast in the transverse colon. An nasogastric tube was placed with fluoroscopic guidance. The anterior abdomen was prepped and draped in sterile fashion. Maximal barrier sterile technique was utilized including caps, mask, sterile gowns, sterile gloves, sterile drape, hand hygiene and skin antiseptic. Stomach was inflated with air through the orogastric tube. The skin and subcutaneous tissues were anesthetized with 1% lidocaine. Small incision was made. A needle with a T-fastener was advanced into the stomach using fluoroscopy. T fastener was deployed without complication. Total of 3 T-fasteners were deployed in a triangular configuration for the gastropexy. Small incision was made between the T-fasteners. Needle was directed through this incision into the stomach using fluoroscopy. Wire was advanced into the stomach. The tract was dilated to accommodate a 22 French peel-away sheath. A 20 French Entuit gastrostomy tube was advanced over the wire and through the peel-away sheath. Peel-away sheath was removed. The retention balloon was inflated with 10 mL of saline containing a small amount of contrast. Contrast was injected through the lumen to confirm placement in the stomach. Gastrostomy tube was flushed with saline. FINDINGS: Gastropexy was performed with 3 T-fasteners situated in a  triangular configuration. 28 French gastrostomy tube was placed between the T-fasteners. Contrast injection confirmed placement in the stomach. IMPRESSION: 1. Successful placement of a percutaneous gastrostomy tube using fluoroscopy. Patient has a 76 [place] balloon retention gastrostomy tube. 2. Patient will return to Interventional Radiology in 10-14 days for removal of the gastropexy T-fasteners. Electronically Signed   By: Markus Daft M.D.   On: 09/11/2020 14:57   NM PET Image Initial (PI) Skull Base To Thigh  Result Date: 08/27/2020 CLINICAL DATA:  Initial treatment strategy for left tonsillar carcinoma. EXAM: NUCLEAR MEDICINE PET SKULL BASE TO THIGH TECHNIQUE: 10.95 mCi F-18  FDG was injected intravenously. Full-ring PET imaging was performed from the skull base to thigh after the radiotracer. CT data was obtained and used for attenuation correction and anatomic localization. Fasting blood glucose: 107 mg/dl COMPARISON:  Neck CT 07/23/2020 FINDINGS: Mediastinal blood pool activity: SUV max 2.77 Liver activity: SUV max NA NECK: Large left tonsillar mass appears to involve the uvula. SUV max is 10.37. Extensive necrotic left neck adenopathy is also hypermetabolic with SUV max of 8.18. Smaller posterior cervical lymph nodes are also mildly hypermetabolic with SUV max of 5.63. Incidental CT findings: none CHEST: No hypermetabolic mediastinal or hilar nodes. No suspicious pulmonary nodules on the CT scan. Incidental CT findings: none ABDOMEN/PELVIS: No abnormal hypermetabolic activity within the liver, pancreas, adrenal glands, or spleen. No hypermetabolic lymph nodes in the abdomen or pelvis. Incidental CT findings: Low-attenuation adrenal gland lesions consistent with benign adenomas. Scattered atherosclerotic calcifications. A left common iliac vein stent is noted. SKELETON: No focal hypermetabolic activity to suggest skeletal metastasis. Incidental CT findings: none IMPRESSION: 1. Left tonsillar mass is  hypermetabolic and consistent with neoplasm. 2. Bulky necrotic left metastatic nodal disease. 3. No findings for metastatic disease involving the chest, abdomen/pelvis or bony structures. Electronically Signed   By: Marijo Sanes M.D.   On: 08/27/2020 16:28   IR IMAGING GUIDED PORT INSERTION  Result Date: 09/11/2020 INDICATION: 60 year old with squamous cell carcinoma of the left tonsil. Port-A-Cath needed for chemotherapy. EXAM: FLUOROSCOPIC AND ULTRASOUND GUIDED PLACEMENT OF A SUBCUTANEOUS PORT COMPARISON:  None. MEDICATIONS: Moderate sedation ANESTHESIA/SEDATION: Versed 3.0 mg IV; Fentanyl 100 mcg IV; Moderate Sedation Time:  24 minutes The patient was continuously monitored during the procedure by the interventional radiology nurse under my direct supervision. FLUOROSCOPY TIME:  30 seconds, 5 mGy COMPLICATIONS: None immediate. PROCEDURE: The procedure, risks, benefits, and alternatives were explained to the patient. Questions regarding the procedure were encouraged and answered. The patient understands and consents to the procedure. Patient was placed supine on the interventional table. Ultrasound confirmed a patent right internal jugular vein. Ultrasound image was saved for documentation. The right chest and neck were cleaned with a skin antiseptic and a sterile drape was placed. Maximal barrier sterile technique was utilized including caps, mask, sterile gowns, sterile gloves, sterile drape, hand hygiene and skin antiseptic. The right neck was anesthetized with 1% lidocaine. Small incision was made in the right neck with a blade. Micropuncture set was placed in the right internal jugular vein with ultrasound guidance. The micropuncture wire was used for measurement purposes. The right chest was anesthetized with 1% lidocaine with epinephrine. #15 blade was used to make an incision and a subcutaneous port pocket was formed. Victor was assembled. Subcutaneous tunnel was formed with a stiff  tunneling device. The port catheter was brought through the subcutaneous tunnel. The port was placed in the subcutaneous pocket. The micropuncture set was exchanged for a peel-away sheath. The catheter was placed through the peel-away sheath and the tip was positioned at the superior cavoatrial junction. Catheter placement was confirmed with fluoroscopy. The port was accessed and flushed with heparinized saline. The port pocket was closed using two layers of absorbable sutures and Dermabond. The vein skin site was closed using a single layer of absorbable suture and Dermabond. Sterile dressings were applied. Patient tolerated the procedure well without an immediate complication. Ultrasound and fluoroscopic images were taken and saved for this procedure. IMPRESSION: Placement of a subcutaneous port device. Catheter tip at the superior cavoatrial junction. Electronically Signed  By: Adam  Henn M.D.   On: 09/11/2020 14:49  ° °   °IMPRESSION/PLAN: ° °This is a delightful patient with oropharyngeal head and neck cancer. I recommend radiotherapy for this patient. ° °I have reviewed his records and based on the history, pathology, and imaging, as well as the p16 positivity, this is likely squamous cell carcinoma.  While he could undergo repeat biopsy, this will not necessarily prove this, as all of the cells may be so poorly differentiated that it would be difficult to discern that this is squamous cell histology.  The pathology did rule out lymphoma which would otherwise be second on my differential diagnosis.  His disease is too advanced to be resectable.  I think that it is reasonable for him to proceed with concurrent chemoradiation.  He will also be discussed at tumor board next week. ° °We discussed the potential risks, benefits, and side effects of radiotherapy. We talked in detail about acute and late effects. We discussed that some of the most bothersome acute effects may be mucositis, dysgeusia, salivary  changes, skin irritation, hair loss, dehydration, weight loss and fatigue. We talked about late effects which include but are not necessarily limited to dysphagia, hypothyroidism, nerve injury, vascular injury, spinal cord injury, xerostomia, trismus, neck edema, and potential injury to any of the tissues in the head and neck region. No guarantees of treatment were given. A consent form was signed and placed in the patient's medical record. The patient is enthusiastic about proceeding with treatment. I look forward to participating in the patient's care.   ° °Simulation (treatment planning) will take place after he is cleared by dentistry ° °We also discussed that the treatment of head and neck cancer is a multidisciplinary process to maximize treatment outcomes and quality of life. For this reason the following referrals have been or will be made: ° ° Medical oncology to discuss chemotherapy  ° ° Dentistry for dental evaluation, possible extractions in the radiation fields, and /or advice on reducing risk of cavities, osteoradionecrosis, or other oral issues. ° ° Nutritionist for nutrition support during and after treatment. ° ° Speech language pathology for swallowing and/or speech therapy. ° ° Social work for social support.  ° ° Physical therapy due to risk of lymphedema in neck and deconditioning. ° ° Baseline labs including TSH. ° °Jennifer, our patient navigator, will also arrange Port-A-Cath and PEG tube placement.. ° °I asked the patient today about tobacco use. The patient uses tobacco.  I advised the patient to quit. Services were offered by me today including outpatient counseling and pharmacotherapy. I assessed for the willingness to attempt to quit and provided encouragement and demonstrated willingness to make referrals and/or prescriptions to help the patient attempt to quit. The patient has follow-up with the oncologic team to touch base on their tobacco use and /or cessation efforts.  Over 3  minutes were spent on this issue.  He plans to quit today without any pharmacotherapy. ° °We discussed measures to reduce the risk of infection during the COVID-19 pandemic.  °He is due for his booster shot.  I strongly recommend he receive this today.  He would like to do so.  We will arrange this at the cancer center. ° °On date of service, in total, I spent 60 minutes on this encounter. Patient was seen in person. ° °__________________________________________ ° ° ° , MD ° °This document serves as a record of services personally performed by  , MD. It was created on his   behalf by Maleeha Khan, a trained medical scribe. The creation of this record is based on the scribe's personal observations and the provider's statements to them. This document has been checked and approved by the attending provider. ° °

## 2020-09-08 NOTE — H&P (View-Only) (Signed)
Radiation Oncology         [phone] ________________________________  Initial Outpatient Consultation  Name: Mitchell Cooper MRN: [id number]  Date: 09/09/2020  DOB: [birth year]  [id number], Dalbert Batman, MD  Melida Quitter, MD   REFERRING PHYSICIAN: Melida Quitter, MD  DIAGNOSIS: C09.0    ICD-10-CM   1. 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 Carcinoma of tonsillar fossa ([place])  C09.0    Cancer Staging Squamous cell carcinoma of left tonsil (HCC) Staging form: Pharynx - HPV-Mediated Oropharynx, AJCC 8th Edition - Clinical stage from 09/09/2020: Stage III (cT3, cN3, cM0, p16+) - Signed by Eppie Gibson, MD on 09/12/2020 Stage prefix: Initial diagnosis   CHIEF COMPLAINT: Here to discuss management of left tonsillar cancer  HISTORY OF PRESENT ILLNESS::Mitchell Cooper is a 60 y.o. male who presented with one year history of enlarging left neck mass.  He had a delay in care partly due to lack of insurance.  He did not develop any dysphagia, speech issues, ear pain, or weight loss.  Pertinent imaging thus far includes soft tissue neck CT scan performed on 07/23/2020 revealing a 2.4 x 4.7 cm mass in the left oropharynx which appeared to be arising from the soft palate extending inferiorly. Findings were compatible with carcinoma. There was also noted to be a large necrotic lymph node mass in the left neck that was compatible with metastatic disease. Additionally small ill-defined lymph nodes on the left could be due to tumor as well.  I have personally reviewed his imaging.  Biopsy of the left tonsil on 07/31/2020 revealed poorly differentiated malignant neoplasm of undetermined classification.  p16(+)   Biopsies revealed:  07/31/2020 A.  LEFT TONSIL, BIOPSY:              Poorly differentiated malignant neoplasm.              See comment. Comment   --This biopsy show the presence of a poorly differentiated malignant neoplasm located at the subepithelial stroma.   Immunostains with appropriate controls show that tumor cells are positive for vimentin and p16, and negative for pancytokeratin (AE1/3), low molecular weight cytokeratin, CK5/6, pan melanoma markers, SOX10, S100, desmin, CD34, CD30  and p40.  CD45, CD3 and CD20 are positive in some background lymphocytes and the tumor cells appear to be negative.  Ki67 is positive in about 40% of the tumor cells. --The findings are consistent with a poorly differentiated malignant neoplasm of undetermined classification.  This tumor shows negative reaction for multiple keratin markers, but a very poorly differentiated carcinoma still can not be entirely ruled.  Because of the negative reaction for multiple markers of melanoma, a malignant melanoma is less likely.  Dr. Sherran Needs also reviewed this case, and believes that the findings in this biopsy are not diagnostic of lymphoma.  A malignant mesenchymal neoplasm is possible, but other types of malignant neoplasm can not be ruled out. --Since this is a relatively small biopsy, additional sampling of the lesion may probably provide additional findings to help further classification of this tumor.  Clinical correlation is recommended   PET Scan, also personally reviewed by me 08/27/2020 IMPRESSION: 1. Left tonsillar mass is hypermetabolic and consistent with neoplasm. 2. Bulky necrotic left metastatic nodal disease. 3. No findings for metastatic disease involving the chest, abdomen/pelvis or bony structures.  (to my eye, he also has contralateral but much smaller adenopathy, right neck)   Nutrition Status Yes No Comments  Weight changes? $RemoveBeforeD'[]'[id number]$'[x]'$   Swallowing concerns? []  [x]    PEG? [x]  []  Getting PEG (and PAC) placed on 09/11/2020   Referrals Yes No Comments  Social Work? [x]  []    Dentistry? [x]  []  Scheduled to meet with Dr. Sandi Mariscal on 09/11/2020  Swallowing therapy? [x]  []    Nutrition? [x]  []    Med/Onc? [x]  []  Met with Dr. Arletha Pili Iruku on 09/03/2020    Safety Issues Yes No Comments  Prior radiation? []  [x]    Pacemaker/ICD? []  [x]    Possible current pregnancy? []  [x]  N/A  Is the patient on methotrexate? []  [x]     Tobacco/Marijuana/Snuff/ETOH use: Reports occasional beer consumption. Quit smoking 2018, but has had about 4 cigarettes in the past 2 weeks. No longer using smokeless tobacco and denies any recreational drug use   Past/Anticipated interventions by medical oncology, if any:  Under care of Dr. Arletha Pili Iruku 09/03/2020 1. SCC left tonsil, T2 N3 M0/ Stage III, P16 positive --We discussed about chemoradiation as the preferred option in this setting vs resection. --He already met with Dr Redmond Baseman who suggested proceed with chemoradiation according to the patient. --We will proceed with weekly cisplatin along with radiation given his good PS. --Discussed about the schedule, adverse effects including but not limited to fatigue, nausea, vomiting, mucositis diarrhea, increased risk of infections, ototoxicity, nephrotoxicity, neuropathy.  He understands that some of the side effects can be permanent and fatal due to chemotherapy. 2.  History of PE without any clear etiology in 2018 and 2020 --Despite the cause, given 2 instances of PE, I think it is reasonable to consider indefinite anticoagulation at this time.   --I do not believe there is a need for hypercoagulable work-up at this time since this would not change management. 3.  Nutrition counseling, discussed that mucositis is a common side effect with concurrent chemoradiation and can lead to rapid weight loss given difficulty swallowing.  --Advised to consider prophylactic G-tube placement to facilitate nutrition during the treatment.  Nutrition referral will be placed. 4.  Port placement for chemotherapy administration, he appreciates this since he does have vasovagal episodes with blood draws. --Radiology to discuss the exact risks with this procedure.   --His last PE was sub massive  back in April 2021. --May have to bridge with lovenox for port placement since last PE was less than a yr ago.  Current Complaints / other details:  Patient has received both Pfizer vaccines and annual flu shot      PREVIOUS RADIATION THERAPY: No  PAST MEDICAL HISTORY:  has a past medical history of DVT (deep venous thrombosis) ([place]), Hypertension (1997), and Tobacco dependence.    PAST SURGICAL HISTORY: Past Surgical History:  Procedure Laterality Date  . IR GASTROSTOMY TUBE MOD SED  09/11/2020  . IR IMAGING GUIDED PORT INSERTION  09/11/2020  . KNEE SURGERY  approx. 1984   reconstructive surgery of left knee; arthroscopic procedure about 1997  . PERIPHERAL VASCULAR CATHETERIZATION Left 06/16/2016   Procedure: Lower Extremity Venography- Venus Thrombolysis;  Surgeon: Serafina Mitchell, MD;  Location: [facility];  Service: Cardiovascular;  Laterality: Left;    FAMILY HISTORY: family history includes CAD (age of onset: 15) in his mother.  SOCIAL HISTORY:  reports that he has been smoking cigarettes. He has never used smokeless tobacco. He reports current alcohol use of about 6.0 standard drinks of alcohol per week. He reports that he does not use drugs.  ALLERGIES: Patient has no known allergies.  MEDICATIONS:  Current Outpatient Medications  Medication Sig Dispense Refill  .  oxyCODONE-acetaminophen (PERCOCET/ROXICET) 5-325 MG tablet Take 1 tablet by mouth every 6 (six) hours as needed for moderate pain or severe pain. 16 tablet 0  . amLODipine (NORVASC) 10 MG tablet Take 1 tablet (10 mg total) by mouth daily. 30 tablet 6  . dexamethasone (DECADRON) 4 MG tablet Take 2 tablets (8 mg total) by mouth daily. Take daily x 3 days starting the day after cisplatin chemotherapy. Take with food. 30 tablet 1  . enoxaparin (LOVENOX) 100 MG/ML injection Inject 1 mL (100 mg total) into the skin 2 (two) times daily for 2 days. 3 mL 0  . lidocaine-prilocaine (EMLA) cream Apply to affected area  once 30 g 3  . LORazepam (ATIVAN) 0.5 MG tablet Take 1 tablet (0.5 mg total) by mouth every 6 (six) hours as needed (Nausea or vomiting). 30 tablet 0  . ondansetron (ZOFRAN) 8 MG tablet Take 1 tablet (8 mg total) by mouth 2 (two) times daily as needed. Start on the third day after cisplatin chemotherapy. 30 tablet 1  . prochlorperazine (COMPAZINE) 10 MG tablet Take 1 tablet (10 mg total) by mouth every 6 (six) hours as needed (Nausea or vomiting). 30 tablet 1  . rivaroxaban (XARELTO) 20 MG TABS tablet Take 1 tablet (20 mg total) by mouth daily with supper. 30 tablet 6   No current facility-administered medications for this encounter.    REVIEW OF SYSTEMS:  Notable for that above.   PHYSICAL EXAM:  weight is 220 lb 12.8 oz (100.2 kg). His temperature is 99.3 F (37.4 C). His blood pressure is 137/84 and his pulse is 99. His respiration is 20 and oxygen saturation is 99%.   General: Alert and oriented, in no acute distress HEENT: Head is normocephalic. Extraocular movements are intact. Oropharynx is notable for bulky left tonsillar tumor, exophytic, involving soft palate, reaching midline. Neck: Neck is notable for bulky mass at level 2/ 3 of left neck, at least 6 cm in size.  No skin invasion.  No palpable adenopathy elsewhere. Heart: Regular in rate and rhythm with no murmurs, rubs, or gallops. Chest: Clear to auscultation bilaterally, with no rhonchi, wheezes, or rales. Abdomen: Soft, nontender, nondistended, with no rigidity or guarding. Extremities: Mild ankle edema Lymphatics: see Neck Exam Skin: No concerning lesions. Musculoskeletal: symmetric strength and muscle tone throughout. Neurologic: Cranial nerves II through XII are grossly intact. No obvious focalities. Speech is fluent. Coordination is intact. Psychiatric: Judgment and insight are intact. Affect is appropriate.   ECOG = 0  0 - Asymptomatic (Fully active, able to carry on all predisease activities without  restriction)  1 - Symptomatic but completely ambulatory (Restricted in physically strenuous activity but ambulatory and able to carry out work of a light or sedentary nature. For example, light housework, office work)  2 - Symptomatic, <50% in bed during the day (Ambulatory and capable of all self care but unable to carry out any work activities. Up and about more than 50% of waking hours)  3 - Symptomatic, >50% in bed, but not bedbound (Capable of only limited self-care, confined to bed or chair 50% or more of waking hours)  4 - Bedbound (Completely disabled. Cannot carry on any self-care. Totally confined to bed or chair)  5 - Death   Eustace Pen MM, Creech RH, Tormey DC, et al. [phone]). "Toxicity and response criteria of the [facility] Group". [place] Oncol. 5 (6): 649-55   LABORATORY DATA:  Lab Results  Component Value Date   WBC 5.6  09/11/2020   HGB 14.8 09/11/2020   HCT 44.5 09/11/2020   MCV 81.1 09/11/2020   PLT 227 09/11/2020   CMP     Component Value Date/Time   NA 140 09/03/2020 1534   NA 139 12/10/2019 1348   K 4.2 09/03/2020 1534   CL 106 09/03/2020 1534   CO2 25 09/03/2020 1534   GLUCOSE 97 09/03/2020 1534   BUN 14 09/03/2020 1534   BUN 17 12/10/2019 1348   CREATININE 1.17 09/03/2020 1534   CALCIUM 9.3 09/03/2020 1534   PROT 7.9 09/03/2020 1534   ALBUMIN 3.6 09/03/2020 1534   AST 12 (L) 09/03/2020 1534   ALT 12 09/03/2020 1534   ALKPHOS 72 09/03/2020 1534   BILITOT 0.4 09/03/2020 1534   GFRNONAA >60 09/03/2020 1534   GFRAA 89 12/10/2019 1348      No results found for: TSH   RADIOGRAPHY: IR Gastrostomy Tube  Result Date: 09/11/2020 INDICATION: 60 year old with squamous cell carcinoma of the left tonsil. Request for a gastrostomy tube prior to chemotherapy and radiation treatment. EXAM: PERCUTANEOUS GASTROSTOMY TUBE WITH FLUOROSCOPIC GUIDANCE Physician: Stephan Minister. Anselm Pancoast, MD MEDICATIONS: Ancef 2 g; Antibiotics were administered within 1 hour of  the procedure. Glucagon 1 mg IV ANESTHESIA/SEDATION: Versed 1.0 mg IV; Fentanyl 100 mcg IV Moderate Sedation Time:  20 minutes The patient was continuously monitored during the procedure by the interventional radiology nurse under my direct supervision. FLUOROSCOPY TIME:  Fluoroscopy Time: 2 minutes, 42 seconds, 69 mGy COMPLICATIONS: None immediate. PROCEDURE: The procedure was explained to the patient. The risks and benefits of the procedure were discussed and the patient's questions were addressed. Informed consent was obtained from the patient. The patient was placed on the interventional table. Fluoroscopy demonstrated oral contrast in the transverse colon. An nasogastric tube was placed with fluoroscopic guidance. The anterior abdomen was prepped and draped in sterile fashion. Maximal barrier sterile technique was utilized including caps, mask, sterile gowns, sterile gloves, sterile drape, hand hygiene and skin antiseptic. Stomach was inflated with air through the orogastric tube. The skin and subcutaneous tissues were anesthetized with 1% lidocaine. Small incision was made. A needle with a T-fastener was advanced into the stomach using fluoroscopy. T fastener was deployed without complication. Total of 3 T-fasteners were deployed in a triangular configuration for the gastropexy. Small incision was made between the T-fasteners. Needle was directed through this incision into the stomach using fluoroscopy. Wire was advanced into the stomach. The tract was dilated to accommodate a 22 French peel-away sheath. A 20 French Entuit gastrostomy tube was advanced over the wire and through the peel-away sheath. Peel-away sheath was removed. The retention balloon was inflated with 10 mL of saline containing a small amount of contrast. Contrast was injected through the lumen to confirm placement in the stomach. Gastrostomy tube was flushed with saline. FINDINGS: Gastropexy was performed with 3 T-fasteners situated in a  triangular configuration. 2 French gastrostomy tube was placed between the T-fasteners. Contrast injection confirmed placement in the stomach. IMPRESSION: 1. Successful placement of a percutaneous gastrostomy tube using fluoroscopy. Patient has a 96 [place] balloon retention gastrostomy tube. 2. Patient will return to Interventional Radiology in 10-14 days for removal of the gastropexy T-fasteners. Electronically Signed   By: Markus Daft M.D.   On: 09/11/2020 14:57   NM PET Image Initial (PI) Skull Base To Thigh  Result Date: 08/27/2020 CLINICAL DATA:  Initial treatment strategy for left tonsillar carcinoma. EXAM: NUCLEAR MEDICINE PET SKULL BASE TO THIGH TECHNIQUE: 10.95 mCi F-18  FDG was injected intravenously. Full-ring PET imaging was performed from the skull base to thigh after the radiotracer. CT data was obtained and used for attenuation correction and anatomic localization. Fasting blood glucose: 107 mg/dl COMPARISON:  Neck CT 07/23/2020 FINDINGS: Mediastinal blood pool activity: SUV max 2.77 Liver activity: SUV max NA NECK: Large left tonsillar mass appears to involve the uvula. SUV max is 10.37. Extensive necrotic left neck adenopathy is also hypermetabolic with SUV max of 3.26. Smaller posterior cervical lymph nodes are also mildly hypermetabolic with SUV max of 7.12. Incidental CT findings: none CHEST: No hypermetabolic mediastinal or hilar nodes. No suspicious pulmonary nodules on the CT scan. Incidental CT findings: none ABDOMEN/PELVIS: No abnormal hypermetabolic activity within the liver, pancreas, adrenal glands, or spleen. No hypermetabolic lymph nodes in the abdomen or pelvis. Incidental CT findings: Low-attenuation adrenal gland lesions consistent with benign adenomas. Scattered atherosclerotic calcifications. A left common iliac vein stent is noted. SKELETON: No focal hypermetabolic activity to suggest skeletal metastasis. Incidental CT findings: none IMPRESSION: 1. Left tonsillar mass is  hypermetabolic and consistent with neoplasm. 2. Bulky necrotic left metastatic nodal disease. 3. No findings for metastatic disease involving the chest, abdomen/pelvis or bony structures. Electronically Signed   By: Marijo Sanes M.D.   On: 08/27/2020 16:28   IR IMAGING GUIDED PORT INSERTION  Result Date: 09/11/2020 INDICATION: 60 year old with squamous cell carcinoma of the left tonsil. Port-A-Cath needed for chemotherapy. EXAM: FLUOROSCOPIC AND ULTRASOUND GUIDED PLACEMENT OF A SUBCUTANEOUS PORT COMPARISON:  None. MEDICATIONS: Moderate sedation ANESTHESIA/SEDATION: Versed 3.0 mg IV; Fentanyl 100 mcg IV; Moderate Sedation Time:  24 minutes The patient was continuously monitored during the procedure by the interventional radiology nurse under my direct supervision. FLUOROSCOPY TIME:  30 seconds, 5 mGy COMPLICATIONS: None immediate. PROCEDURE: The procedure, risks, benefits, and alternatives were explained to the patient. Questions regarding the procedure were encouraged and answered. The patient understands and consents to the procedure. Patient was placed supine on the interventional table. Ultrasound confirmed a patent right internal jugular vein. Ultrasound image was saved for documentation. The right chest and neck were cleaned with a skin antiseptic and a sterile drape was placed. Maximal barrier sterile technique was utilized including caps, mask, sterile gowns, sterile gloves, sterile drape, hand hygiene and skin antiseptic. The right neck was anesthetized with 1% lidocaine. Small incision was made in the right neck with a blade. Micropuncture set was placed in the right internal jugular vein with ultrasound guidance. The micropuncture wire was used for measurement purposes. The right chest was anesthetized with 1% lidocaine with epinephrine. #15 blade was used to make an incision and a subcutaneous port pocket was formed. [place] was assembled. Subcutaneous tunnel was formed with a stiff  tunneling device. The port catheter was brought through the subcutaneous tunnel. The port was placed in the subcutaneous pocket. The micropuncture set was exchanged for a peel-away sheath. The catheter was placed through the peel-away sheath and the tip was positioned at the superior cavoatrial junction. Catheter placement was confirmed with fluoroscopy. The port was accessed and flushed with heparinized saline. The port pocket was closed using two layers of absorbable sutures and Dermabond. The vein skin site was closed using a single layer of absorbable suture and Dermabond. Sterile dressings were applied. Patient tolerated the procedure well without an immediate complication. Ultrasound and fluoroscopic images were taken and saved for this procedure. IMPRESSION: Placement of a subcutaneous port device. Catheter tip at the superior cavoatrial junction. Electronically Signed  By: Markus Daft M.D.   On: 09/11/2020 14:49      IMPRESSION/PLAN:  This is a delightful patient with oropharyngeal head and neck cancer. I recommend radiotherapy for this patient.  I have reviewed his records and based on the history, pathology, and imaging, as well as the p16 positivity, this is likely squamous cell carcinoma.  While he could undergo repeat biopsy, this will not necessarily prove this, as all of the cells may be so poorly differentiated that it would be difficult to discern that this is squamous cell histology.  The pathology did rule out lymphoma which would otherwise be second on my differential diagnosis.  His disease is too advanced to be resectable.  I think that it is reasonable for him to proceed with concurrent chemoradiation.  He will also be discussed at tumor board next week.  We discussed the potential risks, benefits, and side effects of radiotherapy. We talked in detail about acute and late effects. We discussed that some of the most bothersome acute effects may be mucositis, dysgeusia, salivary  changes, skin irritation, hair loss, dehydration, weight loss and fatigue. We talked about late effects which include but are not necessarily limited to dysphagia, hypothyroidism, nerve injury, vascular injury, spinal cord injury, xerostomia, trismus, neck edema, and potential injury to any of the tissues in the head and neck region. No guarantees of treatment were given. A consent form was signed and placed in the patient's medical record. The patient is enthusiastic about proceeding with treatment. I look forward to participating in the patient's care.    Simulation (treatment planning) will take place after he is cleared by dentistry  We also discussed that the treatment of head and neck cancer is a multidisciplinary process to maximize treatment outcomes and quality of life. For this reason the following referrals have been or will be made:   Medical oncology to discuss chemotherapy    Dentistry for dental evaluation, possible extractions in the radiation fields, and /or advice on reducing risk of cavities, osteoradionecrosis, or other oral issues.   Nutritionist for nutrition support during and after treatment.   Speech language pathology for swallowing and/or speech therapy.   Social work for social support.    Physical therapy due to risk of lymphedema in neck and deconditioning.   Baseline labs including TSH.  Anderson Malta, our patient navigator, will also arrange Port-A-Cath and PEG tube placement..  I asked the patient today about tobacco use. The patient uses tobacco.  I advised the patient to quit. Services were offered by me today including outpatient counseling and pharmacotherapy. I assessed for the willingness to attempt to quit and provided encouragement and demonstrated willingness to make referrals and/or prescriptions to help the patient attempt to quit. The patient has follow-up with the oncologic team to touch base on their tobacco use and /or cessation efforts.  Over 3  minutes were spent on this issue.  He plans to quit today without any pharmacotherapy.  We discussed measures to reduce the risk of infection during the COVID-19 pandemic.  He is due for his booster shot.  I strongly recommend he receive this today.  He would like to do so.  We will arrange this at the cancer center.  On date of service, in total, I spent 60 minutes on this encounter. Patient was seen in person.  __________________________________________   Eppie Gibson, MD  This document serves as a record of services personally performed by Eppie Gibson, MD. It was created on his  behalf by Clerance Lav, a trained medical scribe. The creation of this record is based on the scribe's personal observations and the provider's statements to them. This document has been checked and approved by the attending provider.

## 2020-09-08 NOTE — Progress Notes (Signed)
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 His neck mass continued to progress in size. He otherwise denied any dysphagia, speech issues, otalgia or weight loss  PET Scan 08/27/2020 IMPRESSION: 1. Left tonsillar mass is hypermetabolic and consistent with neoplasm. 2. Bulky necrotic left metastatic nodal disease. 3. No findings for metastatic disease involving the chest, abdomen/pelvis or bony structures.  Biopsies revealed:  07/31/2020 A.  LEFT TONSIL, BIOPSY:              Poorly differentiated malignant neoplasm.              See comment. Comment   --This biopsy show the presence of a poorly differentiated malignant neoplasm located at the subepithelial stroma.  Immunostains with appropriate controls show that tumor cells are positive for vimentin and p16, and negative for pancytokeratin (AE1/3), low molecular weight cytokeratin, CK5/6, pan melanoma markers, SOX10, S100, desmin, CD34, CD30  and p40.  CD45, CD3 and CD20 are positive in some background lymphocytes and the tumor cells appear to be negative.  Ki67 is positive in about 40% of the tumor cells. --The findings are consistent with a poorly differentiated malignant neoplasm of undetermined classification.  This tumor shows negative reaction for multiple keratin markers, but a very poorly differentiated carcinoma still can not be entirely ruled.  Because of the negative reaction for multiple markers of melanoma, a malignant melanoma is less likely.  Dr. Sherran Needs also reviewed this case, and believes that the findings in this biopsy are not diagnostic of lymphoma.  A malignant mesenchymal neoplasm is possible, but other types of malignant neoplasm can not be ruled out. --Since this is a relatively small  biopsy, additional sampling of the lesion may probably provide additional findings to help further classification of this tumor.  Clinical correlation is recommended  Nutrition Status Yes No Comments  Weight changes? []  [x]    Swallowing concerns? []  [x]    PEG? [x]  []  Getting PEG (and PAC) placed on 09/11/2020   Referrals Yes No Comments  Social Work? [x]  []    Dentistry? [x]  []  Scheduled to meet with Dr. Sandi Mariscal on 09/11/2020  Swallowing therapy? [x]  []    Nutrition? [x]  []    Med/Onc? [x]  []  Met with Dr. Arletha Pili Iruku on 09/03/2020   Safety Issues Yes No Comments  Prior radiation? []  [x]    Pacemaker/ICD? []  [x]    Possible current pregnancy? []  [x]  N/A  Is the patient on methotrexate? []  [x]     Tobacco/Marijuana/Snuff/ETOH use: Reports occasional beer consumption. Quit smoking 2018, but has had about 4 cigarettes in the past 2 weeks. No longer using smokeless tobacco and denies any recreational drug use  Past/Anticipated interventions by otolaryngology, if any:  07/31/2020 Dr. Melida Quitter Biopsy of left tonsil  Past/Anticipated interventions by medical oncology, if any:  Under care of Dr. Arletha Pili Iruku 09/03/2020 1. SCC left tonsil, T2 N3 M0/ Stage III, P16 positive --We discussed about chemoradiation as the preferred option in this setting vs resection. --He already met with Dr Redmond Baseman who suggested proceed with chemoradiation according to the patient. --We will proceed with weekly cisplatin along with radiation given his good PS. --Discussed about the schedule, adverse effects including but not limited to fatigue, nausea, vomiting, mucositis diarrhea, increased risk of infections, ototoxicity, nephrotoxicity, neuropathy.  He understands  that some of the side effects can be permanent and fatal due to chemotherapy. 2.  History of PE without any clear etiology in 2018 and 2020 --Despite the cause, given 2 instances of PE, I think it is reasonable to consider indefinite  anticoagulation at this time.   --I do not believe there is a need for hypercoagulable work-up at this time since this would not change management. 3.  Nutrition counseling, discussed that mucositis is a common side effect with concurrent chemoradiation and can lead to rapid weight loss given difficulty swallowing.  --Advised to consider prophylactic G-tube placement to facilitate nutrition during the treatment.  Nutrition referral will be placed. 4.  Port placement for chemotherapy administration, he appreciates this since he does have vasovagal episodes with blood draws. --Radiology to discuss the exact risks with this procedure.   --His last PE was sub massive back in April 2021. --May have to bridge with lovenox for port placement since last PE was less than a yr ago.  Current Complaints / other details:  Patient has received both Pfizer vaccines and annual flu shot

## 2020-09-08 NOTE — Telephone Encounter (Signed)
Received MyChart message from pt's daughter requesting a call back. She had several questions regarding her dad's schedule and treatment plan. Reviewed all upcoming appts, including port and peg placement, radiation oncology appts. Reviewed medications that have been sent in prior to the start of chemo. Reviewed fluid and nutritional needs her dad will have. Discussed what would be covered in patient education tomorrow. Laurene Footman states that her mother is going through treatment for  Cervical cancer @ [facility].  Laurene Footman is a Education officer, museum, as is her husband and has 3 children of her own. Advised that we can help with transportation needs once pt starts his radiation/chemo treatments.  Also advised that pt's dental appt that is scheduled on Thursday will be re-scheduled as he will still be in IR for port and PEG placement.  45 minutes spent with daughter. She voiced understanding and much gratitude to explaining all that is and will be going on with her dad. Encouraged her to call with any questions or concerns.  Anderson Malta Malmfelt made aware of needs for Nutrition and SW consults and transportation needs (she takes care of these on the intial consult day with Dr. Isidore Moos).

## 2020-09-09 ENCOUNTER — Other Ambulatory Visit: Payer: Self-pay

## 2020-09-09 ENCOUNTER — Ambulatory Visit
Admission: RE | Admit: 2020-09-09 | Discharge: 2020-09-09 | Disposition: A | Discharge status: Home / Self Care | Payer: Medicaid Other | Source: Ambulatory Visit | Attending: Radiation Oncology | Admitting: Radiation Oncology

## 2020-09-09 ENCOUNTER — Encounter: Payer: Self-pay | Admitting: Radiation Oncology

## 2020-09-09 ENCOUNTER — Inpatient Hospital Stay: Payer: Medicaid Other

## 2020-09-09 ENCOUNTER — Ambulatory Visit
Admission: RE | Admit: 2020-09-09 | Discharge: 2020-09-09 | Disposition: A | Discharge status: Home / Self Care | Payer: Medicaid Other | Source: Ambulatory Visit | Attending: Hematology and Oncology | Admitting: Hematology and Oncology

## 2020-09-09 ENCOUNTER — Other Ambulatory Visit (HOSPITAL_COMMUNITY)
Admission: RE | Admit: 2020-09-09 | Discharge: 2020-09-09 | Disposition: A | Discharge status: Home / Self Care | Payer: Medicaid Other | Source: Ambulatory Visit | Attending: Hematology and Oncology | Admitting: Hematology and Oncology

## 2020-09-09 VITALS — BP 137/84 | HR 99 | Temp 99.3°F | Resp 20 | Wt 220.8 lb

## 2020-09-09 DIAGNOSIS — R5382 Chronic fatigue, unspecified: Secondary | ICD-10-CM

## 2020-09-09 DIAGNOSIS — Z20822 Contact with and (suspected) exposure to covid-19: Secondary | ICD-10-CM | POA: Insufficient documentation

## 2020-09-09 DIAGNOSIS — F17210 Nicotine dependence, cigarettes, uncomplicated: Secondary | ICD-10-CM | POA: Diagnosis not present

## 2020-09-09 DIAGNOSIS — R5381 Other malaise: Secondary | ICD-10-CM

## 2020-09-09 DIAGNOSIS — Z7901 Long term (current) use of anticoagulants: Secondary | ICD-10-CM | POA: Insufficient documentation

## 2020-09-09 DIAGNOSIS — C099 Malignant neoplasm of tonsil, unspecified: Secondary | ICD-10-CM

## 2020-09-09 DIAGNOSIS — Z01812 Encounter for preprocedural laboratory examination: Secondary | ICD-10-CM | POA: Insufficient documentation

## 2020-09-09 DIAGNOSIS — Z23 Encounter for immunization: Secondary | ICD-10-CM

## 2020-09-09 DIAGNOSIS — Z86711 Personal history of pulmonary embolism: Secondary | ICD-10-CM | POA: Diagnosis not present

## 2020-09-09 DIAGNOSIS — Z79899 Other long term (current) drug therapy: Secondary | ICD-10-CM | POA: Diagnosis not present

## 2020-09-09 DIAGNOSIS — Z86718 Personal history of other venous thrombosis and embolism: Secondary | ICD-10-CM | POA: Insufficient documentation

## 2020-09-09 DIAGNOSIS — C090 Malignant neoplasm of tonsillar fossa: Secondary | ICD-10-CM

## 2020-09-09 DIAGNOSIS — I10 Essential (primary) hypertension: Secondary | ICD-10-CM | POA: Diagnosis not present

## 2020-09-09 DIAGNOSIS — C779 Secondary and unspecified malignant neoplasm of lymph node, unspecified: Secondary | ICD-10-CM | POA: Diagnosis not present

## 2020-09-09 LAB — SARS CORONAVIRUS 2 (TAT 6-24 HRS): SARS Coronavirus 2: NEGATIVE

## 2020-09-09 NOTE — Progress Notes (Signed)
   Covid-19 Vaccination Clinic  Name:  Mitchell Cooper    MRN: [id number] DOB: [birth year]  09/09/2020  Mitchell Cooper was observed post Covid-19 immunization for 15 minutes without incident. He was provided with Vaccine Information Sheet and instruction to access the V-Safe system.   Mitchell Cooper was instructed to call 911 with any severe reactions post vaccine: [name] Difficulty breathing  . Swelling of face and throat  . A fast heartbeat  . A bad rash all over body  . Dizziness and weakness   Immunizations Administered    Name Date Dose VIS Date Route   PFIZER Comrnaty(Gray TOP) Covid-19 Vaccine 09/09/2020 11:50 AM 0.3 mL 06/26/2020 Intramuscular   Manufacturer: [employer], [employer]   Lot: [id number]   NDC: [phone]

## 2020-09-09 NOTE — Progress Notes (Signed)
Oncology Nurse Navigator Documentation  Met with patient during initial consult with Dr. Isidore Moos.   . Further introduced myself as his/their Navigator, explained my role as a member of the Care Team. . Provided New Patient Information packet: o Contact information for physician, this navigator, other members of the Care Team o Advance Directive information ([place] blue pamphlet with LCSW insert); provided [facility] AD booklet at his request,  o Fall Prevention Patient [place] sheet o Symptom Management Clinic information o [facility] campus map with highlight of [place] o SLP Information sheet . Provided and discussed educational handouts for PEG and PAC. [name] Assisted with post-consult appt scheduling.  I also met with Mr. Maye to provide PEG/port education prior to 2/24 placement.  Provided port educational handout, showed example, provided guidance for post-surgical dsg removal, site care.  . Using  PEG teaching device   and Teach Back, provided education for PEG use and care, including: hand hygiene, gravity bolus administration of daily water flushes and nutritional supplement, fluids and medications; care of tube insertion site including daily dressing change and cleaning; S&S of infection.   . Mr. Sgroi correctly verbalized procedures for and provided correct return demonstration of gravity administration of water, dressing change and site care.  . I provided written instructions for PEG flushing/dressing change in support of verbal instruction.   . I provided/described contents of Start of Care Bolus Feeding Kit (3 60 cc syringes, 2 boxes 4x4 drainage sponges, 1 package mesh briefs, 1 roll paper tape, 1 case Osmolite 1.5).  He voiced understanding he is to start using Osmolite per guidance of Nutrition. [name] He understands I will be available for ongoing PEG support. Provided barium sulfate prep which I obtained from [facility] IR, reviewed instructions  which included guidance for today's COVID screening at [address].   Harlow Asa RN, BSN, OCN Head & Neck Oncology Nurse [place] at [facility] Phone # [phone]  Fax # [phone]    Harlow Asa, RN, BSN, OCN Head & Neck Oncology Nurse [place] at [place] [phone]

## 2020-09-10 ENCOUNTER — Other Ambulatory Visit: Payer: Self-pay | Admitting: Radiology

## 2020-09-11 ENCOUNTER — Ambulatory Visit (HOSPITAL_COMMUNITY)
Admission: RE | Admit: 2020-09-11 | Discharge: 2020-09-11 | Disposition: A | Discharge status: Home / Self Care | Payer: Medicaid Other | Source: Ambulatory Visit | Attending: Hematology and Oncology | Admitting: Hematology and Oncology

## 2020-09-11 ENCOUNTER — Other Ambulatory Visit: Payer: Self-pay

## 2020-09-11 ENCOUNTER — Encounter (HOSPITAL_COMMUNITY): Payer: Self-pay

## 2020-09-11 ENCOUNTER — Other Ambulatory Visit (HOSPITAL_COMMUNITY): Payer: Medicaid Other | Admitting: Dentistry

## 2020-09-11 ENCOUNTER — Other Ambulatory Visit (HOSPITAL_COMMUNITY): Payer: Self-pay | Admitting: Diagnostic Radiology

## 2020-09-11 ENCOUNTER — Other Ambulatory Visit (HOSPITAL_COMMUNITY): Payer: Self-pay | Admitting: Student

## 2020-09-11 DIAGNOSIS — F17210 Nicotine dependence, cigarettes, uncomplicated: Secondary | ICD-10-CM | POA: Diagnosis not present

## 2020-09-11 DIAGNOSIS — Z79899 Other long term (current) drug therapy: Secondary | ICD-10-CM | POA: Diagnosis not present

## 2020-09-11 DIAGNOSIS — Z5111 Encounter for antineoplastic chemotherapy: Secondary | ICD-10-CM

## 2020-09-11 DIAGNOSIS — C099 Malignant neoplasm of tonsil, unspecified: Secondary | ICD-10-CM

## 2020-09-11 DIAGNOSIS — Z452 Encounter for adjustment and management of vascular access device: Secondary | ICD-10-CM | POA: Diagnosis not present

## 2020-09-11 DIAGNOSIS — Z713 Dietary counseling and surveillance: Secondary | ICD-10-CM

## 2020-09-11 DIAGNOSIS — Z7901 Long term (current) use of anticoagulants: Secondary | ICD-10-CM | POA: Diagnosis not present

## 2020-09-11 DIAGNOSIS — I8290 Acute embolism and thrombosis of unspecified vein: Secondary | ICD-10-CM

## 2020-09-11 DIAGNOSIS — Z431 Encounter for attention to gastrostomy: Secondary | ICD-10-CM | POA: Diagnosis not present

## 2020-09-11 HISTORY — PX: IR GASTROSTOMY TUBE MOD SED: IMG625

## 2020-09-11 HISTORY — PX: IR IMAGING GUIDED PORT INSERTION: IMG5740

## 2020-09-11 LAB — CBC WITH DIFFERENTIAL/PLATELET
Abs Immature Granulocytes: 0.01 10*3/uL (ref 0.00–0.07)
Basophils Absolute: 0.1 10*3/uL (ref 0.0–0.1)
Basophils Relative: 1 %
Eosinophils Absolute: 0.2 10*3/uL (ref 0.0–0.5)
Eosinophils Relative: 3 %
HCT: 44.5 % (ref 39.0–52.0)
Hemoglobin: 14.8 g/dL (ref 13.0–17.0)
Immature Granulocytes: 0 %
Lymphocytes Relative: 28 %
Lymphs Abs: 1.6 10*3/uL (ref 0.7–4.0)
MCH: 27 pg (ref 26.0–34.0)
MCHC: 33.3 g/dL (ref 30.0–36.0)
MCV: 81.1 fL (ref 80.0–100.0)
Monocytes Absolute: 0.8 10*3/uL (ref 0.1–1.0)
Monocytes Relative: 14 %
Neutro Abs: 3 10*3/uL (ref 1.7–7.7)
Neutrophils Relative %: 54 %
Platelets: 227 10*3/uL (ref 150–400)
RBC: 5.49 MIL/uL (ref 4.22–5.81)
RDW: 15.2 % (ref 11.5–15.5)
WBC: 5.6 10*3/uL (ref 4.0–10.5)
nRBC: 0 % (ref 0.0–0.2)

## 2020-09-11 LAB — PROTIME-INR
INR: 1.1 (ref 0.8–1.2)
Prothrombin Time: 13.8 seconds (ref 11.4–15.2)

## 2020-09-11 MED ORDER — LIDOCAINE VISCOUS HCL 2 % MT SOLN
OROMUCOSAL | Status: AC
  Filled 2020-09-11: qty 15

## 2020-09-11 MED ORDER — LIDOCAINE-EPINEPHRINE 1 %-1:100000 IJ SOLN
INTRAMUSCULAR | Status: AC
  Filled 2020-09-11: qty 1

## 2020-09-11 MED ORDER — HYDROCODONE-ACETAMINOPHEN 5-325 MG PO TABS: 1.0000 | ORAL_TABLET | ORAL | Status: DC | PRN

## 2020-09-11 MED ORDER — HEPARIN SOD (PORK) LOCK FLUSH 100 UNIT/ML IV SOLN
INTRAVENOUS | Status: AC | PRN
  Administered 2020-09-11: 500 [IU] via INTRAVENOUS

## 2020-09-11 MED ORDER — LIDOCAINE HCL (PF) 1 % IJ SOLN
INTRAMUSCULAR | Status: AC | PRN
  Administered 2020-09-11: 10 mL
  Administered 2020-09-11 (×3): 5 mL

## 2020-09-11 MED ORDER — SODIUM CHLORIDE 0.9 % IV SOLN: INTRAVENOUS | Status: DC

## 2020-09-11 MED ORDER — OXYCODONE-ACETAMINOPHEN 5-325 MG PO TABS: 1.0000 | ORAL_TABLET | Freq: Four times a day (QID) | ORAL | 0 refills | Status: DC | PRN

## 2020-09-11 MED ORDER — MIDAZOLAM HCL 2 MG/2ML IJ SOLN
INTRAMUSCULAR | Status: AC | PRN
  Administered 2020-09-11 (×4): 1 mg via INTRAVENOUS

## 2020-09-11 MED ORDER — FENTANYL CITRATE (PF) 100 MCG/2ML IJ SOLN
INTRAMUSCULAR | Status: AC
  Filled 2020-09-11: qty 2

## 2020-09-11 MED ORDER — HEPARIN SOD (PORK) LOCK FLUSH 100 UNIT/ML IV SOLN
INTRAVENOUS | Status: AC
  Filled 2020-09-11: qty 5

## 2020-09-11 MED ORDER — GLUCAGON HCL RDNA (DIAGNOSTIC) 1 MG IJ SOLR
INTRAMUSCULAR | Status: AC
  Filled 2020-09-11: qty 1

## 2020-09-11 MED ORDER — CEFAZOLIN SODIUM-DEXTROSE 2-4 GM/100ML-% IV SOLN
INTRAVENOUS | Status: AC
  Filled 2020-09-11: qty 100

## 2020-09-11 MED ORDER — GLUCAGON HCL (RDNA) 1 MG IJ SOLR
INTRAMUSCULAR | Status: AC | PRN
  Administered 2020-09-11: 1 mg via INTRAVENOUS

## 2020-09-11 MED ORDER — MIDAZOLAM HCL 2 MG/2ML IJ SOLN
INTRAMUSCULAR | Status: AC
  Filled 2020-09-11: qty 4

## 2020-09-11 MED ORDER — IOHEXOL 300 MG/ML  SOLN
50.0000 mL | Freq: Once | INTRAMUSCULAR | Status: AC | PRN
  Administered 2020-09-11: 15 mL

## 2020-09-11 MED ORDER — LIDOCAINE HCL 1 % IJ SOLN
INTRAMUSCULAR | Status: AC
  Filled 2020-09-11: qty 20

## 2020-09-11 MED ORDER — CEFAZOLIN SODIUM-DEXTROSE 2-4 GM/100ML-% IV SOLN
2.0000 g | INTRAVENOUS | Status: AC
  Administered 2020-09-11: 2 g via INTRAVENOUS

## 2020-09-11 MED ORDER — FENTANYL CITRATE (PF) 100 MCG/2ML IJ SOLN
INTRAMUSCULAR | Status: AC | PRN
  Administered 2020-09-11 (×4): 50 ug via INTRAVENOUS

## 2020-09-11 NOTE — Progress Notes (Signed)
Oncology Nurse Navigator Documentation  I spoke with Mr. Basey daughter Clemens Catholic regarding recent changes to his schedule and to help clarify any questions she may have regarding her father's schedule/ treatment. I answered her questions, provided her with my contact number and work email. She asked that I enroll her Dad in our transportation program and I have sent an email to transportation services notifying them of Mr. Voeltz and his possible needs for transportation including Monday 2/28. She voiced her appreciation for the phone call and knows to contact me for any future needs.   Harlow Asa RN, BSN, OCN Head & Neck Oncology Nurse [place] at [facility] Phone # [phone]  Fax # [phone]

## 2020-09-11 NOTE — Sedation Documentation (Signed)
End port placement  

## 2020-09-11 NOTE — Procedures (Signed)
Interventional Radiology Procedure:   Indications: Tonsil cancer  Procedure: Port placement and gastrostomy tube placement  Findings: Right jugular port, tip at SVC/RA junction.  20 Fr balloon retention tube in stomach with 3 T-fasteners  Complications: None     EBL: Minimal, less than 10 ml  Plan: Discharge in two hours.  Keep port site and incisions dry for at least 24 hours.  Clears tonight and resume regular diet tomorrow.    Jimia Gentles R. Anselm Pancoast, MD  Pager: [phone]

## 2020-09-11 NOTE — Discharge Instructions (Signed)
For questions /concerns may call Interventional Radiology at [phone]  You may remove your dressing and shower tomorrow afternoon  DO NOT use EMLA cream for 2 weeks after port placement as the cream will remove surgical glue on your incision.     Implanted Port Insertion, Care After This sheet gives you information about how to care for yourself after your procedure. Your health care provider may also give you more specific instructions. If you have problems or questions, contact your health care provider. What can I expect after the procedure? After the procedure, it is common to have:  Discomfort at the port insertion site.  Bruising on the skin over the port. This should improve over 3-4 days. Follow these instructions at home: [facility] care  After your port is placed, you will get a manufacturer's information card. The card has information about your port. Keep this card with you at all times.  Take care of the port as told by your health care provider. Ask your health care provider if you or a family member can get training for taking care of the port at home. A home health care nurse may also take care of the port.  Make sure to remember what type of port you have. Incision care  Follow instructions from your health care provider about how to take care of your port insertion site. Make sure you: ? Wash your hands with soap and water before and after you change your bandage (dressing). If soap and water are not available, use hand sanitizer. ? Change your dressing as told by your health care provider. ? Leave stitches (sutures), skin glue, or adhesive strips in place. These skin closures may need to stay in place for 2 weeks or longer. If adhesive strip edges start to loosen and curl up, you may trim the loose edges. Do not remove adhesive strips completely unless your health care provider tells you to do that.  Check your port insertion site every day for signs of infection. Check  for: ? Redness, swelling, or pain. ? Fluid or blood. ? Warmth. ? Pus or a bad smell.      Activity  Return to your normal activities as told by your health care provider. Ask your health care provider what activities are safe for you.  Do not lift anything that is heavier than 10 lb (4.5 kg), or the limit that you are told, until your health care provider says that it is safe. General instructions  Take over-the-counter and prescription medicines only as told by your health care provider.  Do not take baths, swim, or use a hot tub until your health care provider approves. Ask your health care provider if you may take showers. You may only be allowed to take sponge baths.  Do not drive for 24 hours if you were given a sedative during your procedure.  Wear a medical alert bracelet in case of an emergency. This will tell any health care providers that you have a port.  Keep all follow-up visits as told by your health care provider. This is important. Contact a health care provider if:  You cannot flush your port with saline as directed, or you cannot draw blood from the port.  You have a fever or chills.  You have redness, swelling, or pain around your port insertion site.  You have fluid or blood coming from your port insertion site.  Your port insertion site feels warm to the touch.  You have pus or  a bad smell coming from the port insertion site. Get help right away if:  You have chest pain or shortness of breath.  You have bleeding from your port that you cannot control. Summary  Take care of the port as told by your health care provider. Keep the manufacturer's information card with you at all times.  Change your dressing as told by your health care provider.  Contact a health care provider if you have a fever or chills or if you have redness, swelling, or pain around your port insertion site.  Keep all follow-up visits as told by your health care provider. This  information is not intended to replace advice given to you by your health care provider. Make sure you discuss any questions you have with your health care provider.    Moderate Conscious Sedation, Adult, Care After This sheet gives you information about how to care for yourself after your procedure. Your health care provider may also give you more specific instructions. If you have problems or questions, contact your health care provider. What can I expect after the procedure? After the procedure, it is common to have:  Sleepiness for several hours.  Impaired judgment for several hours.  Difficulty with balance.  Vomiting if you eat too soon. Follow these instructions at home: For the time period you were told by your health care provider:  Rest.  Do not participate in activities where you could fall or become injured.  Do not drive or use machinery.  Do not drink alcohol.  Do not take sleeping pills or medicines that cause drowsiness.  Do not make important decisions or sign legal documents.  Do not take care of children on your own.      Eating and drinking  Follow the diet recommended by your health care provider.  Drink enough fluid to keep your urine pale yellow.  If you vomit: ? Drink water, juice, or soup when you can drink without vomiting. ? Make sure you have little or no nausea before eating solid foods.   General instructions  Take over-the-counter and prescription medicines only as told by your health care provider.  Have a responsible adult stay with you for the time you are told. It is important to have someone help care for you until you are awake and alert.  Do not smoke.  Keep all follow-up visits as told by your health care provider. This is important. Contact a health care provider if:  You are still sleepy or having trouble with balance after 24 hours.  You feel light-headed.  You keep feeling nauseous or you keep vomiting.  You  develop a rash.  You have a fever.  You have redness or swelling around the IV site. Get help right away if:  You have trouble breathing.  You have new-onset confusion at home. Summary  After the procedure, it is common to feel sleepy, have impaired judgment, or feel nauseous if you eat too soon.  Rest after you get home. Know the things you should not do after the procedure.  Follow the diet recommended by your health care provider and drink enough fluid to keep your urine pale yellow.  Get help right away if you have trouble breathing or new-onset confusion at home. This information is not intended to replace advice given to you by your health care provider. Make sure you discuss any questions you have with your health care provider.   Gastrostomy Tube Replacement, Care After This sheet gives  you information about how to care for yourself after your procedure. Your health care provider may also give you more specific instructions. If you have problems or questions, contact your health care provider. What can I expect after the procedure? After the procedure, it is common to have:  Mild pain in your abdomen.  A small amount of blood-colored fluid leaking from the site of your gastrostomy tube (G-tube) replacement. Follow these instructions at home:  Return to your normal activities as told by your health care provider.  You may return to your normal feedings.  Wash your hands for at least 20 seconds before and after caring for your G-tube.  Check the skin around your tube insertion site for: ? Redness. ? Irritation. ? Swelling. ? Drainage. ? Extra tissue growth.  Care for your G-tube as you did before, or as told by your health care provider.  Keep all follow-up visits. This is important.   Contact a health care provider if:  You have a fever or chills.  You see any of these signs on the skin near your insertion  site: ? Redness. ? Irritation. ? Swelling. ? Drainage. ? Extra tissue growth.  You continue to have pain in the abdomen or leaking around your G-tube. Get help right away if:  You develop bleeding or a lot of discharge around the tube.  You have severe pain in the abdomen.  Your new tube is not working properly.  You are unable to get feedings into the tube.  Your tube comes out for any reason. Summary  After the procedure, it is common to have mild pain in your abdomen and a small amount of blood-colored fluid.  Return to your normal activities and feedings as told by your health care provider.  Care for your gastrostomy tube, or G-tube, as you did before, or as told by your health care provider. This information is not intended to replace advice given to you by your health care provider. Make sure you discuss any questions you have with your health care provider. Document Revised: 11/22/2019 Document Reviewed: 11/22/2019 Elsevier Patient Education  [place]. Gastrostomy Tube Home Guide, Adult A gastrostomy tube, or G-tube, is a tube that is inserted through the abdomen into the stomach. The tube is used to give feedings and medicines when a person cannot eat and drink enough on his or her own or take medicines by mouth. How to care for the insertion site Supplies needed:  Saline solution or clean, warm water and soap. Saline solution is made of salt and water.  Cotton swab or gauze.  Pre-cut gauze bandage (dressing) and tape, if needed. Instructions Follow these steps daily to clean the insertion site: 1. Wash your hands with soap and water for at least 20 seconds. 2. Remove the dressing (if there is one) that is between the person's skin and the tube. 3. Check the area where the tube enters the skin. Check daily for problems such as: ? Redness, rash, or irritation. ? Swelling. ? Pus-like drainage. ? Extra skin growth. 4. Moisten the cotton swab or gauze  with the saline solution or with a soap-and-water mixture. Gently clean around the insertion site. Remove any drainage or crusted material. ? When the G-tube is first put in, a normal saline solution or water can be used to clean the skin. ? After the skin around the tube has healed, mild soap and water may be used. 5. Apply a dressing (if there should be one)  between the person's skin and the tube.   How to flush a G-tube Flush the G-tube regularly to keep it from clogging. Flush it before and after feedings and as often as told by the health care provider. Supplies needed:  Purified or germ-free (sterile) water, warmed.  Container with lid for boiling water, if needed.  60 cc G-tube syringe. Instructions Before you begin, decide whether to use sterile water or purified drinking water.  Use only sterile water if: ? The person has a weak disease-fighting (immune) system. ? The person has trouble fighting off infections (is immunocompromised). ? You are unsure about the amount of chemical contaminants in purified or drinking water.  Use purified drinking water in all other cases. To purify drinking water by boiling: ? Boil water for at least 1 minute. Keep lid over water while it boils. ? Let water cool to room temperature before using. Follow these steps to flush the G-tube: 1. Wash your hands with soap and water for at least 20 seconds. 2. Bring out (draw up) 30 mL of warm water in a syringe. 3. Connect the syringe to the tube. 4. Slowly and gently push the water into the tube. General tips  If the tube comes out: ? Cover the opening with a clean dressing and tape. ? Get help right away.  If there is skin or scar tissue growing where the tube enters the skin: ? Keep the area clean and dry. ? Secure the tube with tape so that the tube does not move around too much.  If the tube gets clogged: ? Slowly push warm water into the tube with a large syringe. ? Do not force the fluid  into the tube or push an object into the tube. ? Get help right away if you cannot unclog the tube. Follow these instructions at home: Feedings  Give feedings at room temperature.  If feedings are continuous: ? Do not put more than 4 hours' worth of feedings in the feeding bag. ? Stop the feedings when you need to give medicine or flush the tube. Be sure to restart the feedings. ? Make sure the person's head is above his or her stomach (upright position). This will prevent choking and discomfort.  Make sure the person is in the right position during and after feedings. ? During feedings, have the person in the upright position. ? After a non-continuous feeding (bolus feeding), have the person stay in the upright position for 1 hour.  Cover and place unused feedings in the refrigerator.  Replace feeding bags and syringes as told. Good hygiene  Make sure the person takes good care of his or her mouth and teeth (oral hygiene), such as by brushing his or her teeth.  Keep the area where the tube enters the skin clean and dry. General instructions  Use syringes made only for G-tubes.  Do not pull or put tension on the tube.  Before you remove the tube cap or disconnect a syringe, close the tube by using a clamp (clamping) or bending (kinking) the tube.  Measure the length of the G-tube every day from the insertion site to the end of the tube.  If the person's G-tube has a balloon, check the fluid in the balloon every week. Check the manufacturer's specifications to find the amount of fluid that should be in the balloon.  Remove excess air from the G-tube as told. This is called venting.  Do not push feedings, medicines, or flushes fast. Contact  a health care provider if:  The person with the tube has constipation or a fever.  A large amount of fluid or mucus-like liquid is leaking from the tube.  Skin or scar tissue appears to be growing where the tube enters the skin.  The  length of tube from the insertion site to the G-tube gets longer. Get help right away if:  The person with the tube has any of these problems: ? Severe pain, tenderness, or bloating in the abdomen. ? Nausea or vomiting. ? Trouble breathing or shortness of breath.  Any of these problems happen in the area where the tube enters the skin: ? Redness, irritation, swelling, or soreness. ? Pus-like discharge. ? A bad smell.  The tube is clogged and cannot be flushed.  The tube comes out. The tube will need to put back in within 4 hours. Summary  A gastrostomy tube, or G-tube, is a tube that is inserted through the abdomen into the stomach. The tube is used to give feedings and medicines when a person cannot eat and drink enough on his or her own or cannot take medicine by mouth.  Check and clean the insertion site daily as told by the person's health care provider.  Flush the G-tube regularly to keep it from clogging. Flush it before and after feedings and as often as told.  Keep the area where the tube enters the skin clean and dry. This information is not intended to replace advice given to you by your health care provider. Make sure you discuss any questions you have with your health care provider. Document Revised: 11/19/2019 Document Reviewed: 11/22/2019 Elsevier Patient Education  [place].

## 2020-09-11 NOTE — Progress Notes (Signed)
Ice chips given and tolerated.  Aware of all post IR instructions and ice chips till 3p (4 hrs post Gtube).  Patient states some tenderness.   Refused all pain medication. "want to wait till I get home and rest in my bed" Updated [facility] and provided IR instructions to her as well.  Aware of clear liquids today and diet resume tomorrow.

## 2020-09-11 NOTE — H&P (Signed)
Chief Complaint: Patient was seen in consultation today for [facility] of left tonsil/Port-a-cath and gastrostomy tube placement.  Referring Physician(s): Benay Pike (oncology)  Supervising Physician: Markus Daft  Patient Status: Mitchell Cooper - Out-pt  History of Present Illness: Mitchell Cooper is a 60 y.o. 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 He was unfortunately diagnosed with SCC of left tonsil in 08/2020. His cancer is managed by Dr. Chryl Heck. He has tentative plans for concurrent chemoradiation therapy as management. Due to radiation of tonsil and possible rapid weight loss from this, there is also need for nutritional support during treatment.  IR requested by Dr. Chryl Heck for possible image-guided Port-a-cath placement, along with image-guided percutaneous gastrostomy tube placement. Patient awake and alert sitting in bed with no complaints at this time. Denies fever, chills, chest pain, dyspnea, abdominal pain, or headache.   Past Medical History:  Diagnosis Date  . DVT (deep venous thrombosis) ([place])   . Hypertension 1997   has never taken medications  . Tobacco dependence     Past Surgical History:  Procedure Laterality Date  . KNEE SURGERY  approx. 1984   reconstructive surgery of left knee; arthroscopic procedure about 1997  . PERIPHERAL VASCULAR CATHETERIZATION Left 06/16/2016   Procedure: Lower Extremity Venography- Venus Thrombolysis;  Surgeon: Serafina Mitchell, MD;  Location: [facility];  Service: Cardiovascular;  Laterality: Left;    Allergies: Patient has no known allergies.  Medications: Prior to Admission medications   Medication Sig Start Date End Date Taking? Authorizing Provider  amLODipine (NORVASC) 10 MG tablet Take 1 tablet (10 mg total) by mouth daily. 04/08/20   Ladell Pier, MD  dexamethasone (DECADRON) 4 MG tablet Take 2 tablets (8 mg total) by mouth daily. Take daily  x 3 days starting the day after cisplatin chemotherapy. Take with food. 09/03/20   Benay Pike, MD  enoxaparin (LOVENOX) 100 MG/ML injection Inject 1 mL (100 mg total) into the skin 2 (two) times daily for 2 days. 09/09/20 09/11/20  Benay Pike, MD  lidocaine-prilocaine (EMLA) cream Apply to affected area once 09/03/20   Benay Pike, MD  LORazepam (ATIVAN) 0.5 MG tablet Take 1 tablet (0.5 mg total) by mouth every 6 (six) hours as needed (Nausea or vomiting). 09/03/20   Benay Pike, MD  ondansetron (ZOFRAN) 8 MG tablet Take 1 tablet (8 mg total) by mouth 2 (two) times daily as needed. Start on the third day after cisplatin chemotherapy. 09/03/20   Benay Pike, MD  prochlorperazine (COMPAZINE) 10 MG tablet Take 1 tablet (10 mg total) by mouth every 6 (six) hours as needed (Nausea or vomiting). 09/03/20   Benay Pike, MD  rivaroxaban (XARELTO) 20 MG TABS tablet Take 1 tablet (20 mg total) by mouth daily with supper. 04/08/20   Ladell Pier, MD     Family History  Problem Relation Age of Onset  . CAD Mother 36    Social History   Socioeconomic History  . Marital status: Married    Spouse name: Not on file  . Number of children: Not on file  . Years of education: Not on file  . Highest education level: Not on file  Occupational History  . Occupation: Architect  Tobacco Use  . Smoking status: Current Some Day Smoker    Types: Cigarettes    Last attempt to quit: 11/21/2016    Years since quitting: 3.8  . Smokeless tobacco: Never Used  Vaping Use  . Vaping Use: Never used  Substance and Sexual Activity  . Alcohol use: Yes    Alcohol/week: 6.0 standard drinks    Types: 6 Cans of beer per week  . Drug use: No  . Sexual activity: Not Currently  Other Topics Concern  . Not on file  Social History Narrative  . Not on file   Social Determinants of Health   Financial Resource Strain: Not on file  Food Insecurity: Not on file  Transportation Needs: No  Transportation Needs  . Lack of Transportation (Medical): No  . Lack of Transportation (Non-Medical): No  Physical Activity: Inactive  . Days of Exercise per Week: 0 days  . Minutes of Exercise per Session: 0 min  Stress: Not on file  Social Connections: Not on file     Review of Systems: A 12 point ROS discussed and pertinent positives are indicated in the HPI above.  All other systems are negative.  Review of Systems  Constitutional: Negative for chills and fever.  Respiratory: Negative for shortness of breath and wheezing.   Cardiovascular: Negative for chest pain and palpitations.  Gastrointestinal: Negative for abdominal pain.  Neurological: Negative for headaches.  Psychiatric/Behavioral: Negative for behavioral problems and confusion.    Vital Signs: BP (!) 147/99   Pulse (!) 105   Temp 98.5 F (36.9 C) (Oral)   Resp 20   SpO2 97%   Physical Exam Vitals and nursing note reviewed.  Constitutional:      General: He is not in acute distress.    Appearance: Normal appearance.  Cardiovascular:     Rate and Rhythm: Normal rate and regular rhythm.     Heart sounds: Normal heart sounds. No murmur heard.   Pulmonary:     Effort: Pulmonary effort is normal. No respiratory distress.     Breath sounds: Normal breath sounds. No wheezing.  Abdominal:     Palpations: Abdomen is soft.     Tenderness: There is no abdominal tenderness.  Skin:    General: Skin is warm and dry.  Neurological:     Mental Status: He is alert and oriented to person, place, and time.  Psychiatric:        Mood and Affect: Mood normal.        Behavior: Behavior normal.      MD Evaluation Airway: WNL Heart: WNL Abdomen: WNL Chest/ Lungs: WNL ASA  Classification: 3 Mallampati/Airway Score: Two   Imaging: NM PET Image Initial (PI) Skull Base To Thigh  Result Date: 08/27/2020 CLINICAL DATA:  Initial treatment strategy for left tonsillar carcinoma. EXAM: NUCLEAR MEDICINE PET SKULL BASE TO  THIGH TECHNIQUE: 10.95 mCi F-18 FDG was injected intravenously. Full-ring PET imaging was performed from the skull base to thigh after the radiotracer. CT data was obtained and used for attenuation correction and anatomic localization. Fasting blood glucose: 107 mg/dl COMPARISON:  Neck CT 07/23/2020 FINDINGS: Mediastinal blood pool activity: SUV max 2.77 Liver activity: SUV max NA NECK: Large left tonsillar mass appears to involve the uvula. SUV max is 10.37. Extensive necrotic left neck adenopathy is also hypermetabolic with SUV max of 0.93. Smaller posterior cervical lymph nodes are also mildly hypermetabolic with SUV max of 2.67. Incidental CT findings: none CHEST: No hypermetabolic mediastinal or hilar nodes. No suspicious pulmonary nodules on the CT scan. Incidental CT findings: none ABDOMEN/PELVIS: No abnormal hypermetabolic activity within the liver, pancreas, adrenal glands, or spleen. No hypermetabolic lymph nodes in the abdomen or pelvis. Incidental CT findings:  Low-attenuation adrenal gland lesions consistent with benign adenomas. Scattered atherosclerotic calcifications. A left common iliac vein stent is noted. SKELETON: No focal hypermetabolic activity to suggest skeletal metastasis. Incidental CT findings: none IMPRESSION: 1. Left tonsillar mass is hypermetabolic and consistent with neoplasm. 2. Bulky necrotic left metastatic nodal disease. 3. No findings for metastatic disease involving the chest, abdomen/pelvis or bony structures. Electronically Signed   By: Marijo Sanes M.D.   On: 08/27/2020 16:28    Labs:  CBC: Recent Labs    11/06/19 0231 12/10/19 1348 09/03/20 1534 09/11/20 0800  WBC 9.8 7.6 6.3 5.6  HGB 13.8 14.9 14.8 14.8  HCT 40.8 44.5 44.9 44.5  PLT 141* 209 251 227    COAGS: Recent Labs    11/04/19 1414 11/05/19 0629  INR 2.0* 1.3*  APTT 63*  --     BMP: Recent Labs    11/04/19 1426 11/05/19 0629 11/06/19 0231 12/10/19 1348 09/03/20 1534  NA 137 139 139  139 140  K 4.2 4.0 3.7 4.1 4.2  CL 107 108 108 104 106  CO2 18* 20* 23 21 25   GLUCOSE 136* 103* 115* 99 97  BUN 23* 17 14 17 14   CALCIUM 7.9* 8.4* 8.5* 9.3 9.3  CREATININE 1.93* 1.34* 1.27* 1.07 1.17  GFRNONAA 38* 58* >60 77 >60  GFRAA 44* >60 >60 89  --     LIVER FUNCTION TESTS: Recent Labs    11/04/19 1426 09/03/20 1534  BILITOT 1.4* 0.4  AST 46* 12*  ALT 31 12  ALKPHOS 61 72  PROT 6.3* 7.9  ALBUMIN 2.8* 3.6     Assessment and Plan:  SCC of left tonsil with tentative plans for concurrent chemoradiation therapy as management. Plan for image-guided Port-a-cath placement, and image-guided percutaneous gastrostomy tube placement today in IR. Patient is NPO. Afebrile and WBCs WNL. Xarelto held per IR protocol. INR pending.  Risks and benefits of image-guided Port-a-catheter placement were discussed with the patient including, but not limited to bleeding, infection, pneumothorax, or fibrin sheath development and need for additional procedures. All of the patient's questions were answered, patient is agreeable to proceed. Consent signed and in chart.  Risks and benefits discussed with the patient including, but not limited to the need for a barium enema during the procedure, bleeding, infection, peritonitis, or damage to adjacent structures. All of the patient's questions were answered, patient is agreeable to proceed. Consent signed and in chart.   Thank you for this interesting consult.  I greatly enjoyed meeting PROSPER PAFF and look forward to participating in their care.  A copy of this report was sent to the requesting provider on this date.  Electronically Signed: Earley Abide, PA-C 09/11/2020, 8:20 AM   I spent a total of 30 Minutes in face to face in clinical consultation, greater than 50% of which was counseling/coordinating care for [facility] of left tonsil/Port-a-cath and gastrostomy tube placement.

## 2020-09-12 ENCOUNTER — Encounter: Payer: Self-pay | Admitting: General Practice

## 2020-09-12 ENCOUNTER — Encounter: Payer: Self-pay | Admitting: Radiation Oncology

## 2020-09-12 DIAGNOSIS — C090 Malignant neoplasm of tonsillar fossa: Secondary | ICD-10-CM | POA: Insufficient documentation

## 2020-09-12 NOTE — Progress Notes (Signed)
[facility] CSW Progress Notes  Call to patient per referral for new diagnosis of head and neck cancer, no answer, left VM w my contact information and encouragement to call back.  Edwyna Shell, LCSW Clinical Social Worker Phone:  [phone]

## 2020-09-12 NOTE — Progress Notes (Signed)
Dental Form with Estimates of Radiation Dose      Diagnosis:    ICD-10-CM   1. Squamous cell carcinoma of left tonsil (HCC)  C09.9     Prognosis: curative  Anticipated # of fractions: 35    Daily?: yes  # of weeks of radiotherapy: 7  Chemotherapy?: yes  Anticipated xerostomia:  Mild permanent   Pre-simulation needs:  SPDs? And evaluate teeth pre-RT  Simulation: ASAP   Other Notes: anticipate 50Gy will get close to or overlap posterior roots on left, only.  Please contact Eppie Gibson, MD, with patient's disposition after evaluation and/or dental treatment.

## 2020-09-15 ENCOUNTER — Encounter (HOSPITAL_COMMUNITY): Payer: Self-pay

## 2020-09-15 ENCOUNTER — Telehealth: Payer: Self-pay | Admitting: Internal Medicine

## 2020-09-15 ENCOUNTER — Other Ambulatory Visit (HOSPITAL_COMMUNITY): Payer: Medicaid Other | Admitting: Dentistry

## 2020-09-15 ENCOUNTER — Other Ambulatory Visit: Payer: Self-pay | Admitting: Hematology and Oncology

## 2020-09-15 MED FILL — XARELTO 20 MG TABLET: 20 | 30 days supply | Qty: 30 | Fill #4

## 2020-09-15 MED FILL — AMLODIPINE BESYLATE 10 MG T: 10 | 30 days supply | Qty: 30 | Fill #2

## 2020-09-15 NOTE — Telephone Encounter (Signed)
   JAYCE KAINZ DOB: July 29, 1960 MRN: [id number]   RIDER WAIVER AND RELEASE OF LIABILITY  For purposes of improving physical access to our facilities, [facility] is pleased to partner with third parties to provide [facility] patients or other authorized individuals the option of convenient, on-demand ground transportation services (the [employer]") through use of the technology service that enables users to request on-demand ground transportation from independent third-party providers.  By opting to use and accept these [employer], I, the undersigned, hereby agree on behalf of myself, and on behalf of any minor child using the [employer] for whom I am the parent or legal guardian, as follows:  1. Government social research officer provided to me are provided by independent third-party transportation providers who are not [employer] or employees and who are unaffiliated with [employer]. 2. [facility] is neither a transportation carrier nor a common or public carrier. 3. [facility] has no control over the quality or safety of the transportation that occurs as a result of the [employer]. 4. [facility] cannot guarantee that any third-party transportation provider will complete any arranged transportation service. 5. [facility] makes no representation, warranty, or guarantee regarding the reliability, timeliness, quality, safety, suitability, or availability of any of the Transport Services or that they will be error free. 6. I fully understand that traveling by vehicle involves risks and dangers of serious bodily injury, including permanent disability, paralysis, and death. I agree, on behalf of myself and on behalf of any minor child using the Transport Services for whom I am the parent or legal guardian, that the entire risk arising out of my use of the [employer] remains solely with me, to the maximum extent permitted under applicable law. 7. The [employer] are provided "as is" and "as available." [facility] disclaims all representations and warranties, express, implied or statutory, not expressly set out in these terms, including the implied warranties of merchantability and fitness for a particular purpose. 8. I hereby waive and release [facility], its agents, employees, officers, directors, representatives, insurers, attorneys, assigns, successors, subsidiaries, and affiliates from any and all past, present, or future claims, demands, liabilities, actions, causes of action, or suits of any kind directly or indirectly arising from acceptance and use of the [employer]. 9. I further waive and release [facility] and its affiliates from all present and future liability and responsibility for any injury or death to persons or damages to property caused by or related to the use of the [employer]. 10. I have read this Waiver and Release of Liability, and I understand the terms used in it and their legal significance. This Waiver is freely and voluntarily given with the understanding that my right (as well as the right of any minor child for whom I am the parent or legal guardian using the [employer]) to legal recourse against  in connection with the [employer] is knowingly surrendered in return for use of these services.   I attest that I read the consent document to Arlice Colt, gave Mr. Vernon the opportunity to ask questions and answered the questions asked (if any). I affirm that Arlice Colt then provided consent for he's participation in this program.     Legrand Pitts

## 2020-09-16 ENCOUNTER — Telehealth: Payer: Medicaid Other | Admitting: Hematology and Oncology

## 2020-09-16 ENCOUNTER — Other Ambulatory Visit: Payer: Medicaid Other

## 2020-09-16 ENCOUNTER — Ambulatory Visit (INDEPENDENT_AMBULATORY_CARE_PROVIDER_SITE_OTHER): Payer: Medicaid Other | Admitting: Dentistry

## 2020-09-16 ENCOUNTER — Other Ambulatory Visit: Payer: Self-pay

## 2020-09-16 ENCOUNTER — Ambulatory Visit: Payer: Medicaid Other | Admitting: Hematology and Oncology

## 2020-09-16 VITALS — BP 135/89 | HR 93 | Temp 98.6°F

## 2020-09-16 DIAGNOSIS — K0530 Chronic periodontitis, unspecified: Secondary | ICD-10-CM

## 2020-09-16 DIAGNOSIS — K029 Dental caries, unspecified: Secondary | ICD-10-CM

## 2020-09-16 DIAGNOSIS — M270 Developmental disorders of jaws: Secondary | ICD-10-CM

## 2020-09-16 DIAGNOSIS — C099 Malignant neoplasm of tonsil, unspecified: Secondary | ICD-10-CM

## 2020-09-16 DIAGNOSIS — K08109 Complete loss of teeth, unspecified cause, unspecified class: Secondary | ICD-10-CM

## 2020-09-16 DIAGNOSIS — K030 Excessive attrition of teeth: Secondary | ICD-10-CM

## 2020-09-16 DIAGNOSIS — K032 Erosion of teeth: Secondary | ICD-10-CM

## 2020-09-16 DIAGNOSIS — Z7901 Long term (current) use of anticoagulants: Secondary | ICD-10-CM

## 2020-09-16 DIAGNOSIS — K06020 Generalized gingival recession, unspecified: Secondary | ICD-10-CM

## 2020-09-16 DIAGNOSIS — K083 Retained dental root: Secondary | ICD-10-CM

## 2020-09-16 DIAGNOSIS — K036 Deposits [accretions] on teeth: Secondary | ICD-10-CM

## 2020-09-16 DIAGNOSIS — K045 Chronic apical periodontitis: Secondary | ICD-10-CM

## 2020-09-16 DIAGNOSIS — Z01818 Encounter for other preprocedural examination: Secondary | ICD-10-CM | POA: Diagnosis not present

## 2020-09-16 DIAGNOSIS — K0889 Other specified disorders of teeth and supporting structures: Secondary | ICD-10-CM

## 2020-09-16 DIAGNOSIS — M264 Malocclusion, unspecified: Secondary | ICD-10-CM

## 2020-09-16 NOTE — Progress Notes (Addendum)
Department of Dental Medicine    OUTPATIENT CONSULTATION  Service Date:  09/16/2020  Patient Name:  Mitchell Cooper Date of Birth:   [birth year] Medical Record Number: [id number]  Referring Provider:             Eppie Gibson, MD    PLAN/RECOMMENDATIONS   <>   There are no current signs of acute dental infection including abscess, edema or erythema, or suspicious lesion requiring biopsy.  The patient does have multiple grossly decayed teeth with chronic apical infection and periodontal disease.  There are posterior teeth that will be in the field of radiation that are chronically infected and/or have a poor periodontal prognosis. <>    Recommend extractions of indicated teeth prior to starting radiation therapy to decrease the risk of perioperative and postoperative infection and/or complications such as ORNJ. <>    Plan was discussed with medical team in tumor board on 3/2, and will coordinate treatment as needed.  <> Recommend the patient return to our clinic for a follow-up visit after completion of radiation and then establish care at a dental office of their choice for comprehensive care including replacement of missing teeth, cleanings/periodontal therapy and exams. <> Discussed in detail all treatment options with the patient and they are agreeable to the plan.   Thank you for consulting with [facility] and for the opportunity to participate in this patient's treatment.  Should you have any questions or concerns, please contact the [facility] at [phone].   09/16/20     CONSULT NOTE   COVID 19 SCREENING: The patient denies symptoms concerning for COVID-19 infection including fever, chills, cough, or newly developed shortness of breath.   HISTORY OF PRESENT ILLNESS: Mitchell Cooper is a very pleasant 60 y.o. 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  He presents today for a medically necessary dental consultation as part of their Pre-Radiation Therapy work-up.  DENTAL HISTORY: > The patient reports that he has not seen a dentist in a very long time.  He reports brushing his teeth twice daily with [employer] toothpaste for sensitive teeth but admits to not flossing.  He currently deny any dental/orofacial pain or sensitivity. > Patient is able to manage oral secretions.  Patient denies dysphagia, odynophagia, dysphonia, SOB and neck pain.  Patient denies fever, rigors and malaise.  CHIEF COMPLAINT: Here for a pre-head and neck radiation dental evaluation.   Patient Active Problem List   Diagnosis Date Noted  . Carcinoma of tonsillar fossa ([place]) 09/12/2020  . Squamous cell carcinoma of left tonsil ([place]) 09/03/2020  . Tonsillar mass 08/08/2020  . Mass of left side of neck 07/09/2020  . Former smoker 04/08/2020  . Primary osteoarthritis of both knees 04/08/2020  . VTE (venous thromboembolism) 03/19/2020  . RVF (right ventricular failure) ([place]) 12/07/2019  . Recurrent pulmonary emboli ([place]) 11/03/2019  . 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 History of pulmonary embolus (PE) 06/14/2016  . Essential hypertension 06/14/2016  . CKD (chronic kidney disease) stage 3, GFR 30-59 ml/min ([facility]) 06/14/2016  . Hyperglycemia 06/14/2016   Past Medical History:  Diagnosis Date  . DVT (deep venous thrombosis) ([place])   . Hypertension 1997   has never taken medications  . Tobacco dependence    Past Surgical History:  Procedure Laterality Date  . IR GASTROSTOMY TUBE MOD SED  09/11/2020  . IR IMAGING  GUIDED PORT INSERTION  09/11/2020  . KNEE SURGERY  approx. 1984   reconstructive surgery of left knee; arthroscopic procedure about 1997  . PERIPHERAL VASCULAR CATHETERIZATION Left 06/16/2016   Procedure: Lower Extremity Venography- Venus Thrombolysis;  Surgeon: Serafina Mitchell, MD;  Location: [facility];  Service: Cardiovascular;  Laterality: Left;   No Known Allergies Current Outpatient Medications  Medication Sig Dispense Refill  . oxyCODONE-acetaminophen (PERCOCET/ROXICET) 5-325 MG tablet Take 1 tablet by mouth every 6 (six) hours as needed for moderate pain or severe pain. 16 tablet 0  . amLODipine (NORVASC) 10 MG tablet Take 1 tablet (10 mg total) by mouth daily. 30 tablet 6  . dexamethasone (DECADRON) 4 MG tablet Take 2 tablets (8 mg total) by mouth daily. Take daily x 3 days starting the day after cisplatin chemotherapy. Take with food. 30 tablet 1  . enoxaparin (LOVENOX) 100 MG/ML injection Inject 1 mL (100 mg total) into the skin 2 (two) times daily for 2 days. 3 mL 0  . lidocaine-prilocaine (EMLA) cream Apply to affected area once 30 g 3  . LORazepam (ATIVAN) 0.5 MG tablet Take 1 tablet (0.5 mg total) by mouth every 6 (six) hours as needed (Nausea or vomiting). 30 tablet 0  . ondansetron (ZOFRAN) 8 MG tablet Take 1 tablet (8 mg total) by mouth 2 (two) times daily as needed. Start on the third day after cisplatin chemotherapy. 30 tablet 1  . prochlorperazine (COMPAZINE) 10 MG tablet Take 1 tablet (10 mg total) by mouth every 6 (six) hours as needed (Nausea or vomiting). 30 tablet 1  . rivaroxaban (XARELTO) 20 MG TABS tablet Take 1 tablet (20 mg total) by mouth daily with supper. 30 tablet 6   No current facility-administered medications for this visit.    LABS: Lab Results  Component Value Date   WBC 5.6 09/11/2020   HGB 14.8 09/11/2020   HCT 44.5 09/11/2020   MCV 81.1 09/11/2020   PLT 227 09/11/2020      Component Value Date/Time   NA 140 09/03/2020 1534   NA 139 12/10/2019 1348   K 4.2 09/03/2020 1534   CL 106 09/03/2020 1534   CO2 25 09/03/2020 1534   GLUCOSE 97 09/03/2020 1534   BUN 14 09/03/2020 1534   BUN 17 12/10/2019 1348   CREATININE 1.17 09/03/2020 1534   CALCIUM 9.3 09/03/2020 1534   GFRNONAA >60 09/03/2020 1534   GFRAA 89 12/10/2019 1348   Lab  Results  Component Value Date   INR 1.1 09/11/2020   INR 1.3 (H) 11/05/2019   INR 2.0 (H) 11/04/2019   No results found for: PTT  Social History   Socioeconomic History  . Marital status: Married    Spouse name: Not on file  . Number of children: Not on file  . Years of education: Not on file  . Highest education level: Not on file  Occupational History  . Occupation: Architect  Tobacco Use  . Smoking status: Current Some Day Smoker    Types: Cigarettes    Last attempt to quit: 11/21/2016    Years since quitting: 3.8  . Smokeless tobacco: Never Used  Vaping Use  . Vaping Use: Never used  Substance and Sexual Activity  . Alcohol use: Yes    Alcohol/week: 6.0 standard drinks    Types: 6 Cans of beer per week  . Drug use: No  . Sexual activity: Not Currently  Other Topics Concern  . Not on file  Social History  Narrative  . Not on file   Social Determinants of Health   Financial Resource Strain: Not on file  Food Insecurity: Not on file  Transportation Needs: No Transportation Needs  . Lack of Transportation (Medical): No  . Lack of Transportation (Non-Medical): No  Physical Activity: Inactive  . Days of Exercise per Week: 0 days  . Minutes of Exercise per Session: 0 min  Stress: Not on file  Social Connections: Not on file  Intimate Partner Violence: Not on file   Family History  Problem Relation Age of Onset  . CAD Mother 79     REVIEW OF SYSTEMS: Reviewed with the patient as per HPI. PSYCH: Patient denies having dental phobia.  VITAL SIGNS: BP 135/89 (BP Location: Right Arm)   Pulse 93   Temp 98.6 F (37 C)    PHYSICAL EXAM: > General:  Well-developed, comfortable and in no apparent distress. > Neurological:  Alert and oriented to person, place and  time. > Extraoral:  Facial symmetry present without any edema or erythema.  (+) Mass palpable on left side of neck.  TMJ asymptomatic without clicks or crepitations. > Maximum Interincisal Opening:  50 mm > Intraoral:  Soft tissues appear well-perfused and mucous membranes moist.  FOM and vestibules soft and not raised. Oral cavity without mass or lesion. No signs of infection, parulis, sinus tract, edema or erythema evident upon exam. (+) Bilateral mandibular tori   DENTAL EXAM: All clinical findings charted.  > Dentition:  Overall fair remaining dentition.  Missing teeth, caries, retained root tips. Incisal attrition and lingual erosion through enamel on teeth #7, #8 and #10.  #8 and #10 fractured incisal edges. #32 is partially erupted with only 50% of crown visible clinically. -The patient is maintaining poor oral hygiene. > Periodontal: Inflamed, erythematous gingival tissue. Generalized calculus accumulation, heavy on mandibular anterior teeth facial/lingual surfaces.  Gingival recession. #31 has Class III mobility. > Caries: Clinical caries charted. > Retained Root Tips: #2, #14, #30 and #31 > Occlusion: Unable to assess molar occlusion. Edge to edge incisal relationship.  Non-functional tooth #15 and #19. Supra-erupted teeth #1, #3, #15, #16 and #19.  #17, #31 and #32 mesially drifted.   RADIOGRAPHIC EXAM: PAN and Full Mouth Series exposed and interpreted.  >> Condyles seated bilaterally in fossas.  No evidence of abnormal pathology.  All visualized osseous structures appear WNL.  >> Generalized mild horizontal bone loss with areas of localized moderate consistent with mild periodontitis. #17 and #32 vertical bone loss on the mesial with widening PDL. Significant radiographic calculus evident on interproximal surfaces of teeth. >> Missing teeth, caries, existing restorations. Retained roots #2, #14 and #31 with periapical radiolucencies. #30 grossly decayed with no visible PDL and periapical radiolucency.   ASSESSMENT: 1. SCC of the Left Tonsil 2. Pre-Radiation Dental Consultation 3. Long-term use of anticoagulants (on Xarelto) 4. Missing teeth 5. Caries 6. Retained dental  root 7. Chronic apical periodontitis 8. Excessive attrition 9. Erosion 10. Chronic periodontitis 11. Accretions on teeth 12. Gingival recession 13. Loose teeth 14. 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 Postoperative bleeding risk   PROCEDURES:  1. Trismus appliance made using patient's baseline MIO with tongue depressors (27 sticks).  Leta Speller, DAII demonstrated use of appliance.  Verbal and written postop instructions were given to the patient. 2. The common and significant side effects of radiation therapy to the head and neck were explained and discussed with the patient.  The discussion included side effects of trismus (limited opening), dysgeusia (  loss of taste), xerostomia (dry mouth), radiation caries and osteoradionecrosis of the jaw.  I also discussed the importance of maintaining optimal oral hygiene and oral health before, during and after radiation to decrease the risk of developing radiation cavities and the need for any surgery such as extractions after therapy.     PLAN AND RECOMMENDATIONS: <>  I discussed the risks, benefits, and complications of various scenarios with the patient in relationship to their medical and dental conditions, which included localized or systemic infection before, during and/or after radiation and osteoradionecrosis that could occur if dental/oral concerns are not addressed.  I explained that if any chronic or acute dental/oral infection(s) are addressed prior to radiation and subsequently not maintained following treatment and recovery, the risk of complications are the remain throughout the rest of their lifetime.  I further discussed all significant findings of the dental evaluation with the patient including multiple broken down teeth with chronic infection, retained root tips, other cavities and heavy tartar (calculus) accumulation which has lead to periodontal disease or infection of the gums, and the recommended treatment including extractions of  infected teeth/root tips and potentially more after discussing with patient's oncology team in order to optimize them for radiation therapy from a dental standpoint.  The patient verbalized understanding of all findings, discussion, and recommendations. <>  We then discussed various treatment options to include no treatment, multiple extractions with alveoloplasty, pre-prosthetic surgery as indicated, periodontal therapy, dental restorations, root canal therapy, crown and bridge therapy, implant therapy, and replacement of missing teeth as indicated.  The patient verbalized understanding of all options, and currently wishes to proceed with multiple extractions of all indicated teeth with alveoloplasty as needed.  Will need to schedule the patient to have surgery in the operating room under GA due to his cardiac history and use of anticoagulant therapy.  Will also need to consult with patient's primary care doctor and cardiologist about Lovenox bridging and other recommendation or contraindications. <>  Plan to discuss all findings and recommendations with medical team and coordinate future care as needed.  Plan discussed in 3/2 tumor board meeting.  At this time, we will plan to move forward with extractions prior to starting radiation.   <> The patient tolerated today's visit well.  All questions and concerns were addressed and answered, and the patient departed in stable condition.   I spent in excess of 120 minutes during the conduct of this consultation and >50% of this time involved direct face-to-face encounter for counseling and/or coordination of the patient's care.  [facility] Benson Norway, D.M.D.    09/17/2020

## 2020-09-16 NOTE — Patient Instructions (Signed)
[facility] Department of Dental Medicine Dr. Vayda Dungee B. Irene Collings Phone: [phone] Fax: [phone]   It was a pleasure seeing you today!  Please refer to the information below regarding your dental visit with us, and call us should you have any questions or concerns that may come up after you leave.   Thank you for giving us the opportunity to provide care for you.  If there is anything we can do for you, please let us know.     RADIATION THERAPY AND INFORMATION REGARDING YOUR TEETH   [x]Xerostomia (Dry Mouth) Your salivary glands may be in the field of radiation.  Radiation may include all or only part of your salivary glands.  This will cause your saliva to dry up, and you will have a dry mouth.  The dry mouth will be for the rest of your life unless your radiation oncologist tells you otherwise.  Your saliva has many functions:  It wets your tongue for speaking.  It coats your teeth and the inside of your mouth for easier movement.  It helps with chewing and swallowing food.  It helps clean away harmful acid and toxic products made by the germs in your mouth, therefore it helps prevent cavities.  It kills some germs in your mouth and helps to prevent gum disease.  It helps to carry flavor to your taste buds.  Once you have lost your saliva you will be at higher risk for tooth decay and gum disease.    What can be done to help improve your mouth when there's not enough saliva: . Your dentist may give a prescription for Salagen.  It will not bring back all of your saliva but may bring back some of it.  Also, your saliva may be thick and ropy or white and foamy. It will not feel like it use to feel. . You will need to swish with water every time your mouth feels dry.  YOU CANNOT suck on any cough drops, mints, lemon drops, candy, vitamin C or any other products.  You cannot use anything other than water to make your mouth feel less dry.  If you want to drink anything else,  you have to drink it all at once and brush afterwards.  Be sure to discuss the details of your diet habits with your dentist or hygienist.  [x]Radiation caries: . This is decay (cavities) that happens very quickly once your mouth is very dry due to radiation therapy.  Normally, cavities take six months to two years to become a problem.  When you have dry mouth, cavities may take as little as eight weeks to cause you a problem.   . Dental check-ups every two months are necessary as long as you have a dry mouth. Radiation caries typically, but not always, start at your gum line where it is hard to see the cavity.  It is therefore also hard to fill these cavities adequately.  This high rate of cavities happens because your mouth no longer has saliva and therefore the acid made by the germs starts the decay process.  Whenever you eat anything the germs in your mouth change the food into acid.  The acid then burns a small hole in your tooth.  This small hole is the beginning of a cavity.  If this is not treated then it will grow bigger and become a cavity.  The way to avoid this hole getting bigger is to use fluoride every evening as prescribed by your   dentist following your radiation.   NOTE:  You have to make sure that your teeth are very clean before you use the fluoride.  This fluoride in turn will strengthen your teeth and prepare them for another day of fighting acid.  If you develop radiation caries many times, the damage is so large that you will have to have all your teeth removed.  This could be a big problem if some of these teeth are in the field of radiation.  Further details of why this could be a big problem will follow.  (See Osteoradionecrosis).  [x]Dysgeusia (Loss of Taste) This happens to varying degrees once you've had radiation therapy to your jaw region.  Many times taste is not completely lost, but becomes limited.  The loss of taste is mostly due to radiation affecting your taste buds.   However, if you have no saliva in your mouth to carry the flavor to your taste buds, it would be difficult for your taste buds to taste anything.  That is why using water or a prescription for Salagen prior to meals and during meal times may help with some of the taste.  Keep in mind that taste generally returns very slowly over the course of several months or several years after radiation therapy.  Don't give up hope.  [x]Trismus (Limited Jaw Opening) According to your Radiation Oncologist, your TMJ or jaw joints are going to be partially or fully in the field of radiation.  This means that over time the muscles that help you open and close your mouth may get stiff.  This will potentially result in your not being able to open your mouth wide enough or as wide as you can open it now.    Let me give you an example of how slowly this happens and how unaware people are of it:   A gentlemen that had radiation therapy two years ago came back to me complaining that bananas are just too large for him to be able to fit them in between his teeth.  He was not able to open wide enough to bite into a banana.  This happens slowly and over a period of time.  What we do to try and prevent this:   . Your dentist will probably give you a stack of sticks called a trismus exercise device.  This stack will help remind your muscles and your jaw joints to open up to the same distance every day.  Use these sticks every morning when you wake up, or according to the instructions given by your dentist.    . You must use these sticks for at least one to two years after radiation therapy.  The reason for that is because it happens so slowly and keeps going on for about two years after radiation therapy.  Your hospital dentist will help you monitor your mouth opening and make sure that it's not getting smaller after radiation.  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 This is a condition where your jaw bone after radiation therapy becomes  very dry.  It has very little blood supply to keep it alive.  If you develop a cavity that turns into an abscess or an infection, then the jaw bone does not have enough blood supply to help fight the infection.  At this point it is very likely that the infection could cause the death of your jaw bone.  When you have dead bone it has to be removed.  Therefore, you might end up having   to have surgery to remove part of your jaw bone, the part of the jaw bone that has been affected.    . Healing is also a problem if you are to have surgery (like a tooth extraction) in the areas where the bone has had radiation therapy.  If you have surgery, you need more blood supply to heal which is not available.  When blood supply and oxygen are not available, there is a chance for the bone to die. . Occasionally, ORN happens on its own with no obvious reason, but this is quite rare.  We believe that patients who continue to smoke and/or drink alcohol have a higher chance of having this problem. . Once your jaw bone has had radiation therapy, if there are any remaining teeth in that area, it is not recommended to have them pulled unless your dentist or oral surgeon is aware of your history of radiation and believes it is safe.  . The risks for ORN either from infection or spontaneously occurring (with no reason) are life long. 

## 2020-09-17 ENCOUNTER — Encounter: Payer: Self-pay | Admitting: *Deleted

## 2020-09-17 ENCOUNTER — Ambulatory Visit: Payer: Medicaid Other

## 2020-09-17 NOTE — Progress Notes (Signed)
[place] Work  Initial Assessment   BRASON BERTHELOT is a 60 y.o. year old male and new head and neck patient.  CSW contacted by phone. Clinical Social Work was referred by radiation oncology for assessment of psychosocial needs.   SDOH (Social Determinants of Health) assessments performed: Yes SDOH Interventions   Flowsheet Row Most Recent Value  SDOH Interventions   Food Insecurity Interventions Intervention Not Indicated  Financial Strain Interventions Other (Comment)  [Working with [employer] and has appied for SSDi]  Stress Interventions Intervention Not Indicated  Social Connections Interventions Intervention Not Indicated  Transportation Interventions [facility]      Distress Screen completed: Yes ONCBCN DISTRESS SCREENING 09/03/2020  Distress experienced in past week (1-10) 2  Emotional problem type Adjusting to illness      Family/Social Information:  . Housing Arrangement: patient lives with wife . Family members/support persons in your life? Family, Friends/Colleagues and Community.  5 adult children (daughter very involved with treatment) and multiple friends and family. . Transportation concerns: no  . Employment: Disabled. Income source: Short-Term Disability, Long-Term Disability, has applied for Social Security Disability through the [employer].  . Financial concerns: Yes, due to illness and/or loss of work during treatment o Type of concern: Medical bills . Food access concerns: no . Religious or spiritual practice: yes . Medication Concerns: no  . Services Currently in place:  Patient working with [facility].  Applied for SSDi in April 2021  Coping/ Adjustment to diagnosis: . Patient understands treatment plan and what happens next? yes . Concerns about diagnosis and/or treatment: How I will care for other members of my family . Patient reported stressors: Adjusting to my illness . Hopes and priorities:  completing treatment and caring for my family  . Patient enjoys being outside and time with family/ friends . Current coping skills/ strengths: Ability for insight, Active sense of humor, Capable of independent living, Communication skills, Religious Affiliation and Supportive family/friends    SUMMARY: Current SDOH Barriers:  . Financial constraints related to SSD  Clinical Social Work Clinical Goal(s):  . patient will work with SW to address concerns related to treatment needs or concerns.  Interventions: . Discussed common feeling and emotions when being diagnosed with cancer, and the importance of support during treatment . Informed patient of the support team roles and support services at [facility] . Provided CSW contact information and encouraged patient to call with any questions or concerns    Follow Up Plan: Patient will contact CSW with any support or resource needs Patient verbalizes understanding of plan: Yes   Johnnye Lana, MSW, LCSW, OSW-C Clinical Social Worker [place] [phone]       Amedeo Kinsman , LCSW

## 2020-09-18 ENCOUNTER — Ambulatory Visit: Payer: Medicaid Other | Admitting: Hematology and Oncology

## 2020-09-18 ENCOUNTER — Ambulatory Visit: Payer: Medicaid Other

## 2020-09-18 ENCOUNTER — Other Ambulatory Visit: Payer: Medicaid Other

## 2020-09-18 ENCOUNTER — Encounter: Payer: Medicaid Other | Admitting: Nutrition

## 2020-09-23 ENCOUNTER — Ambulatory Visit: Payer: Medicaid Other | Admitting: Hematology and Oncology

## 2020-09-23 ENCOUNTER — Telehealth (HOSPITAL_COMMUNITY): Payer: Self-pay | Admitting: Dentistry

## 2020-09-23 ENCOUNTER — Other Ambulatory Visit: Payer: Medicaid Other

## 2020-09-23 DIAGNOSIS — C099 Malignant neoplasm of tonsil, unspecified: Secondary | ICD-10-CM | POA: Diagnosis not present

## 2020-09-23 NOTE — Telephone Encounter (Signed)
09/23/20   1630  Called and spoke with patient regarding his dental surgery scheduled for this Thursday (3/10) in the operating room.  Instructed patient to not take his Xarelto tomorrow or Thursday until after his dental surgery.  Explained to patient that this was verified by both of his doctors, Drs. Bensimohn and Johnson, to prevent excessive bleeding following his extractions. They also agreed no bridging therapy was necessary while holding Xarelto.  He verbalized understanding.   [place] Benson Norway, DMD

## 2020-09-24 ENCOUNTER — Encounter: Payer: Self-pay | Admitting: Hematology and Oncology

## 2020-09-24 ENCOUNTER — Ambulatory Visit: Payer: Medicaid Other

## 2020-09-24 ENCOUNTER — Other Ambulatory Visit (HOSPITAL_COMMUNITY)
Admission: RE | Admit: 2020-09-24 | Discharge: 2020-09-24 | Disposition: A | Discharge status: Home / Self Care | Payer: Medicaid Other | Source: Ambulatory Visit | Attending: Dentistry | Admitting: Dentistry

## 2020-09-24 DIAGNOSIS — Z01812 Encounter for preprocedural laboratory examination: Secondary | ICD-10-CM | POA: Diagnosis not present

## 2020-09-24 DIAGNOSIS — Z20822 Contact with and (suspected) exposure to covid-19: Secondary | ICD-10-CM | POA: Insufficient documentation

## 2020-09-24 LAB — SARS CORONAVIRUS 2 (TAT 6-24 HRS): SARS Coronavirus 2: NEGATIVE

## 2020-09-24 NOTE — Progress Notes (Signed)
Called pt to introduce myself as his Arboriculturist.  Pt's ins should pay for his treatment at 100% so copay assistance shouldn't be needed.  I offered the [employer], went over what it covers and gave him the income requirement.  He's going to discuss it with his wife and bring proof of income on 09/25/20.  If approved I will give him an expense sheet and my card for any questions or concerns he may have in the future.

## 2020-09-24 NOTE — Progress Notes (Signed)
PCP - Dr Karle Plumber Cardiologist - [facility]  Chest x-ray - 05/30/20 (2V) EKG - 11/26/19 Stress Test - n/a ECHO - 02/12/20 Cardiac Cath - n/a  Blood Thinner Instructions:  Last dose of Xarelto was on 09/23/20  STOP now taking any Aspirin (unless otherwise instructed by your surgeon), Aleve, Naproxen, Ibuprofen, Motrin, Advil, Goody's, BC's, all herbal medications, fish oil, and all vitamins.   Coronavirus Screening Covid test is scheduled on 09/24/20 Do you have any of the following symptoms:  Cough yes/no: No Fever (>100.45F)  yes/no: No Runny nose yes/no: No Sore throat yes/no: No Difficulty breathing/shortness of breath  yes/no: No  Have you traveled in the last 14 days and where? yes/no: No  Patient verbalized understanding of instructions that were given via phone.

## 2020-09-25 ENCOUNTER — Other Ambulatory Visit (HOSPITAL_COMMUNITY): Payer: Self-pay | Admitting: Dentistry

## 2020-09-25 ENCOUNTER — Ambulatory Visit (HOSPITAL_COMMUNITY)
Admission: RE | Admit: 2020-09-25 | Discharge: 2020-09-25 | Disposition: A | Discharge status: Home / Self Care | Payer: Medicaid Other | Attending: Dentistry | Admitting: Dentistry

## 2020-09-25 ENCOUNTER — Other Ambulatory Visit: Payer: Self-pay | Admitting: Hematology and Oncology

## 2020-09-25 ENCOUNTER — Ambulatory Visit (HOSPITAL_COMMUNITY): Payer: Medicaid Other | Admitting: Certified Registered Nurse Anesthetist

## 2020-09-25 ENCOUNTER — Other Ambulatory Visit: Payer: Medicaid Other

## 2020-09-25 ENCOUNTER — Encounter (HOSPITAL_COMMUNITY)
Admission: RE | Disposition: A | Discharge status: Home / Self Care | Payer: Self-pay | Source: Home / Self Care | Attending: Dentistry

## 2020-09-25 ENCOUNTER — Ambulatory Visit: Payer: Medicaid Other | Admitting: Hematology and Oncology

## 2020-09-25 ENCOUNTER — Ambulatory Visit: Payer: Medicaid Other

## 2020-09-25 ENCOUNTER — Other Ambulatory Visit: Payer: Self-pay

## 2020-09-25 ENCOUNTER — Inpatient Hospital Stay (HOSPITAL_COMMUNITY)
Admission: RE | Admit: 2020-09-25 | Discharge: 2020-09-25 | Disposition: A | Discharge status: Home / Self Care | Payer: Medicaid Other | Source: Ambulatory Visit | Attending: Diagnostic Radiology | Admitting: Diagnostic Radiology

## 2020-09-25 ENCOUNTER — Encounter: Payer: Self-pay | Admitting: Hematology and Oncology

## 2020-09-25 ENCOUNTER — Encounter (HOSPITAL_COMMUNITY): Payer: Self-pay | Admitting: Dentistry

## 2020-09-25 ENCOUNTER — Inpatient Hospital Stay: Payer: Medicaid Other | Admitting: Hematology and Oncology

## 2020-09-25 DIAGNOSIS — K029 Dental caries, unspecified: Secondary | ICD-10-CM

## 2020-09-25 DIAGNOSIS — F17210 Nicotine dependence, cigarettes, uncomplicated: Secondary | ICD-10-CM | POA: Insufficient documentation

## 2020-09-25 DIAGNOSIS — N183 Chronic kidney disease, stage 3 unspecified: Secondary | ICD-10-CM | POA: Diagnosis not present

## 2020-09-25 DIAGNOSIS — C090 Malignant neoplasm of tonsillar fossa: Secondary | ICD-10-CM | POA: Diagnosis not present

## 2020-09-25 DIAGNOSIS — Z86711 Personal history of pulmonary embolism: Secondary | ICD-10-CM | POA: Diagnosis not present

## 2020-09-25 DIAGNOSIS — I129 Hypertensive chronic kidney disease with stage 1 through stage 4 chronic kidney disease, or unspecified chronic kidney disease: Secondary | ICD-10-CM | POA: Diagnosis not present

## 2020-09-25 DIAGNOSIS — K056 Periodontal disease, unspecified: Secondary | ICD-10-CM | POA: Diagnosis not present

## 2020-09-25 DIAGNOSIS — Z7901 Long term (current) use of anticoagulants: Secondary | ICD-10-CM | POA: Insufficient documentation

## 2020-09-25 DIAGNOSIS — K0530 Chronic periodontitis, unspecified: Secondary | ICD-10-CM

## 2020-09-25 DIAGNOSIS — Z86718 Personal history of other venous thrombosis and embolism: Secondary | ICD-10-CM | POA: Insufficient documentation

## 2020-09-25 DIAGNOSIS — Z79899 Other long term (current) drug therapy: Secondary | ICD-10-CM | POA: Insufficient documentation

## 2020-09-25 HISTORY — DX: Malignant (primary) neoplasm, unspecified: C80.1

## 2020-09-25 HISTORY — PX: MULTIPLE EXTRACTIONS WITH ALVEOLOPLASTY: SHX5342

## 2020-09-25 SURGERY — MULTIPLE EXTRACTION WITH ALVEOLOPLASTY
Anesthesia: General | Site: Mouth

## 2020-09-25 MED ORDER — LIDOCAINE 2% (20 MG/ML) 5 ML SYRINGE
INTRAMUSCULAR | Status: DC | PRN
  Administered 2020-09-25: 60 mg via INTRAVENOUS

## 2020-09-25 MED ORDER — DEXMEDETOMIDINE (PRECEDEX) IN NS 20 MCG/5ML (4 MCG/ML) IV SYRINGE
PREFILLED_SYRINGE | INTRAVENOUS | Status: DC | PRN
  Administered 2020-09-25: 20 ug via INTRAVENOUS

## 2020-09-25 MED ORDER — DEXAMETHASONE SODIUM PHOSPHATE 10 MG/ML IJ SOLN
INTRAMUSCULAR | Status: DC | PRN
  Administered 2020-09-25: 10 mg via INTRAVENOUS

## 2020-09-25 MED ORDER — FENTANYL CITRATE (PF) 100 MCG/2ML IJ SOLN: 25.0000 ug | INTRAMUSCULAR | Status: DC | PRN

## 2020-09-25 MED ORDER — ROCURONIUM BROMIDE 10 MG/ML (PF) SYRINGE
PREFILLED_SYRINGE | INTRAVENOUS | Status: AC
  Filled 2020-09-25: qty 10

## 2020-09-25 MED ORDER — OXYCODONE HCL 5 MG PO TABS: 5.0000 mg | ORAL_TABLET | Freq: Once | ORAL | Status: DC | PRN

## 2020-09-25 MED ORDER — DEXAMETHASONE SODIUM PHOSPHATE 10 MG/ML IJ SOLN
INTRAMUSCULAR | Status: AC
  Filled 2020-09-25: qty 1

## 2020-09-25 MED ORDER — PROMETHAZINE HCL 25 MG/ML IJ SOLN: 6.2500 mg | INTRAMUSCULAR | Status: DC | PRN

## 2020-09-25 MED ORDER — MIDAZOLAM HCL 2 MG/2ML IJ SOLN
INTRAMUSCULAR | Status: AC
  Filled 2020-09-25: qty 2

## 2020-09-25 MED ORDER — CHLORHEXIDINE GLUCONATE 0.12 % MT SOLN
OROMUCOSAL | Status: AC
  Administered 2020-09-25: 15 mL via OROMUCOSAL
  Filled 2020-09-25: qty 15

## 2020-09-25 MED ORDER — ONDANSETRON HCL 4 MG/2ML IJ SOLN
INTRAMUSCULAR | Status: DC | PRN
  Administered 2020-09-25: 4 mg via INTRAVENOUS

## 2020-09-25 MED ORDER — THROMBIN 5000 UNITS EX KIT
PACK | CUTANEOUS | Status: AC
  Filled 2020-09-25: qty 1

## 2020-09-25 MED ORDER — BUPIVACAINE-EPINEPHRINE (PF) 0.5% -1:200000 IJ SOLN
INTRAMUSCULAR | Status: AC
  Filled 2020-09-25: qty 1.8

## 2020-09-25 MED ORDER — ROCURONIUM BROMIDE 10 MG/ML (PF) SYRINGE
PREFILLED_SYRINGE | INTRAVENOUS | Status: DC | PRN
  Administered 2020-09-25: 60 mg via INTRAVENOUS

## 2020-09-25 MED ORDER — 0.9 % SODIUM CHLORIDE (POUR BTL) OPTIME
TOPICAL | Status: DC | PRN
  Administered 2020-09-25: 1000 mL

## 2020-09-25 MED ORDER — SUGAMMADEX SODIUM 200 MG/2ML IV SOLN
INTRAVENOUS | Status: DC | PRN
  Administered 2020-09-25: 200 mg via INTRAVENOUS

## 2020-09-25 MED ORDER — OXYMETAZOLINE HCL 0.05 % NA SOLN
NASAL | Status: AC
  Filled 2020-09-25: qty 30

## 2020-09-25 MED ORDER — PROPOFOL 10 MG/ML IV BOLUS
INTRAVENOUS | Status: DC | PRN
  Administered 2020-09-25: 150 mg via INTRAVENOUS

## 2020-09-25 MED ORDER — AMINOCAPROIC ACID SOLUTION 5% (50 MG/ML)
10.0000 mL | ORAL | Status: DC
  Filled 2020-09-25: qty 100

## 2020-09-25 MED ORDER — KETOROLAC TROMETHAMINE 30 MG/ML IJ SOLN
INTRAMUSCULAR | Status: DC | PRN
  Administered 2020-09-25: 30 mg via INTRAVENOUS

## 2020-09-25 MED ORDER — FENTANYL CITRATE (PF) 100 MCG/2ML IJ SOLN
INTRAMUSCULAR | Status: DC | PRN
  Administered 2020-09-25 (×2): 50 ug via INTRAVENOUS
  Administered 2020-09-25: 100 ug via INTRAVENOUS
  Administered 2020-09-25: 50 ug via INTRAVENOUS

## 2020-09-25 MED ORDER — ORAL CARE MOUTH RINSE: 15.0000 mL | Freq: Once | OROMUCOSAL | Status: AC

## 2020-09-25 MED ORDER — CEFAZOLIN SODIUM-DEXTROSE 2-4 GM/100ML-% IV SOLN
INTRAVENOUS | Status: AC
  Filled 2020-09-25: qty 100

## 2020-09-25 MED ORDER — ONDANSETRON HCL 4 MG/2ML IJ SOLN
INTRAMUSCULAR | Status: AC
  Filled 2020-09-25: qty 2

## 2020-09-25 MED ORDER — LIDOCAINE 2% (20 MG/ML) 5 ML SYRINGE
INTRAMUSCULAR | Status: AC
  Filled 2020-09-25: qty 5

## 2020-09-25 MED ORDER — OXYCODONE HCL 5 MG/5ML PO SOLN: 5.0000 mg | Freq: Once | ORAL | Status: DC | PRN

## 2020-09-25 MED ORDER — FENTANYL CITRATE (PF) 250 MCG/5ML IJ SOLN
INTRAMUSCULAR | Status: AC
  Filled 2020-09-25: qty 5

## 2020-09-25 MED ORDER — BUPIVACAINE-EPINEPHRINE (PF) 0.5% -1:200000 IJ SOLN
INTRAMUSCULAR | Status: AC
  Filled 2020-09-25: qty 3.6

## 2020-09-25 MED ORDER — HEMOSTATIC AGENTS (NO CHARGE) OPTIME
TOPICAL | Status: DC | PRN
  Administered 2020-09-25: 1 via TOPICAL

## 2020-09-25 MED ORDER — LIDOCAINE-EPINEPHRINE 2 %-1:100000 IJ SOLN
INTRAMUSCULAR | Status: AC
  Filled 2020-09-25: qty 10.2

## 2020-09-25 MED ORDER — HYDROCODONE-ACETAMINOPHEN 5-325 MG PO TABS
1.0000 | ORAL_TABLET | Freq: Four times a day (QID) | ORAL | 0 refills | Status: AC | PRN
Start: 2020-09-25 — End: 2020-09-28

## 2020-09-25 MED ORDER — LACTATED RINGERS IV SOLN: INTRAVENOUS | Status: DC

## 2020-09-25 MED ORDER — CEFAZOLIN SODIUM-DEXTROSE 2-4 GM/100ML-% IV SOLN
2.0000 g | INTRAVENOUS | Status: AC
  Administered 2020-09-25: 2 g via INTRAVENOUS

## 2020-09-25 MED ORDER — CHLORHEXIDINE GLUCONATE 0.12 % MT SOLN: 15.0000 mL | Freq: Once | OROMUCOSAL | Status: AC

## 2020-09-25 MED ORDER — LIDOCAINE-EPINEPHRINE 2 %-1:100000 IJ SOLN
INTRAMUSCULAR | Status: DC | PRN
  Administered 2020-09-25: 5.1 mL

## 2020-09-25 MED ORDER — AMISULPRIDE (ANTIEMETIC) 5 MG/2ML IV SOLN: 10.0000 mg | Freq: Once | INTRAVENOUS | Status: DC | PRN

## 2020-09-25 MED ORDER — MIDAZOLAM HCL 2 MG/2ML IJ SOLN
INTRAMUSCULAR | Status: DC | PRN
  Administered 2020-09-25: 2 mg via INTRAVENOUS

## 2020-09-25 SURGICAL SUPPLY — 32 items
ALCOHOL 70% 16 OZ (MISCELLANEOUS) ×2 IMPLANT
BLADE SURG 15 STRL LF DISP TIS (BLADE) ×1 IMPLANT
BLADE SURG 15 STRL SS (BLADE) ×2
COVER SURGICAL LIGHT HANDLE (MISCELLANEOUS) ×2 IMPLANT
COVER WAND RF STERILE (DRAPES) ×2 IMPLANT
GAUZE 4X4 16PLY RFD (DISPOSABLE) ×2 IMPLANT
GAUZE PACKING FOLDED 2  STR (GAUZE/BANDAGES/DRESSINGS) ×2
GAUZE PACKING FOLDED 2 STR (GAUZE/BANDAGES/DRESSINGS) ×1 IMPLANT
GLOVE BIO SURGEON STRL SZ 6.5 (GLOVE) ×2 IMPLANT
GLOVE SURG SS PI 6.0 STRL IVOR (GLOVE) ×2 IMPLANT
GOWN STRL REUS W/ TWL LRG LVL3 (GOWN DISPOSABLE) ×2 IMPLANT
GOWN STRL REUS W/TWL LRG LVL3 (GOWN DISPOSABLE) ×4
KIT BASIN OR (CUSTOM PROCEDURE TRAY) ×2 IMPLANT
KIT TURNOVER KIT B (KITS) ×2 IMPLANT
MANIFOLD NEPTUNE II (INSTRUMENTS) ×2 IMPLANT
NS IRRIG 1000ML POUR BTL (IV SOLUTION) ×2 IMPLANT
PACK EENT II TURBAN DRAPE (CUSTOM PROCEDURE TRAY) ×2 IMPLANT
PAD ARMBOARD 7.5X6 YLW CONV (MISCELLANEOUS) ×2 IMPLANT
SPONGE SURGIFOAM ABS GEL 100 (HEMOSTASIS) IMPLANT
SPONGE SURGIFOAM ABS GEL 12-7 (HEMOSTASIS) IMPLANT
SPONGE SURGIFOAM ABS GEL SZ50 (HEMOSTASIS) ×2 IMPLANT
SUCTION FRAZIER HANDLE 10FR (MISCELLANEOUS) ×2
SUCTION TUBE FRAZIER 10FR DISP (MISCELLANEOUS) ×1 IMPLANT
SUT CHROMIC 3 0 PS 2 (SUTURE) ×4 IMPLANT
SUT CHROMIC 4 0 P 3 18 (SUTURE) IMPLANT
SYR 50ML SLIP (SYRINGE) ×2 IMPLANT
SYR BULB IRRIG 60ML STRL (SYRINGE) ×2 IMPLANT
TOWEL GREEN STERILE FF (TOWEL DISPOSABLE) ×2 IMPLANT
TUBE CONNECTING 12X1/4 (SUCTIONS) ×2 IMPLANT
WATER STERILE IRR 1000ML POUR (IV SOLUTION) ×2 IMPLANT
WATER TABLETS ICX (MISCELLANEOUS) ×2 IMPLANT
YANKAUER SUCT BULB TIP NO VENT (SUCTIONS) ×2 IMPLANT

## 2020-09-25 NOTE — Progress Notes (Signed)
After discussion with pathology at [facility], it was thought that he needed additional pathology to confirm the histology since the original sample suggested a poorly differentiated carcinoma Additional biopsies obtained yesterday We will await path and proceed with planned CRT once path confirmed SCC.  Mitchell Cooper

## 2020-09-25 NOTE — Interval H&P Note (Signed)
History and Physical Interval Note:  09/25/2020 2:12 PM  [name]  has presented today for surgery, with the diagnosis of DENTAL CARIES.  The various methods of treatment have been discussed with the patient and family. After consideration of risks, benefits and other options for treatment, the patient has consented to  Procedure(s): MULTIPLE EXTRACTION WITH ALVEOLOPLASTY (N/A) as a surgical intervention.  The patient's history has been reviewed, patient examined, no change in status, stable for surgery.  I have reviewed the patient's chart and labs.  Questions were answered to the patient's satisfaction.  Patient confirms stopping Xarelto 2 days prior to today's procedure.   Mitchell Cooper

## 2020-09-25 NOTE — Transfer of Care (Signed)
Immediate Anesthesia Transfer of Care Note  Patient: Mitchell Cooper  Procedure(s) Performed: MULTIPLE EXTRACTIONS WITH ALVEOLOPLASTY (N/A Mouth)  Patient Location: PACU  Anesthesia Type:General  Level of Consciousness: awake, alert  and oriented  Airway & Oxygen Therapy: Patient Spontanous Breathing and Patient connected to face mask oxygen  Post-op Assessment: Report given to RN and Post -op Vital signs reviewed and stable  Post vital signs: Reviewed and stable  Last Vitals:  Vitals Value Taken Time  BP 127/98 09/25/20 1617  Temp    Pulse 66 09/25/20 1617  Resp 9 09/25/20 1617  SpO2 96 % 09/25/20 1617  Vitals shown include unvalidated device data.  Last Pain:  Vitals:   09/25/20 1308  TempSrc:   PainSc: 0-No pain         Complications: No complications documented.

## 2020-09-25 NOTE — Procedures (Signed)
Interventional Radiology Procedure Note  T-tacks were removed in IR holding.  No issues.  G-tube site looks good.     Signed,  Criselda Peaches, MD

## 2020-09-25 NOTE — Progress Notes (Deleted)
[facility] NOTE  Patient Care Team: Ladell Pier, MD as PCP - General (Internal Medicine)  CHIEF COMPLAINTS/PURPOSE OF CONSULTATION:  New diagnosis of left tonsillar cancer.  ASSESSMENT & PLAN:  No problem-specific Assessment & Plan notes found for this encounter.  No orders of the defined types were placed in this encounter.  1. SCC left tonsil, T2 N3 M0/ Stage III, P16 positive Initially we discussed about chemoradiation, recently when we discussed him in the tumor board, pathologist mentioned that this is poorly differentiated P16 positive SCC and they cant confirm if its SCC  2.  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  I do not believe there is a need for hypercoagulable work-up at this time since this would not change management.    HISTORY OF PRESENTING ILLNESS:  Mitchell Cooper 61 y.o. male is here because of new diagnosis of tonsil cancer  Mitchell Cooper is a 60 y.o. male who presents as a consultation at the request of Dr.Bates with new diagnosis of poorly differentiated carcinoma noted on tonsilar biopsy. He initially presented with a chief complaint of neck mass going on for more than one year, care delayed partly by the pandemic. His neck mass continued to progress in size. He otherwise denied any dysphagia, speech issues, otalgia or weight loss He had two episodes of PE in 2018 and 2021 and is on indefinite anticoagulation with xarelto. According to patient there was no clear etiology for these PE  Except for long periods of immobilization for work. He otherwise has HTN, well controlled on norvasc He is not a heavy smoker, smoked a pack in a week or so, quit smoking in 2018, resumed in 2021. He was a Architect worked by occupation.  Imaging and work up done so far.  07/23/2020  IMPRESSION: 1. 2.4  x 4.7 cm mass in the left oropharynx which appears to be arising from the soft palate extending inferiorly. Findings compatible with carcinoma. 2. Large necrotic lymph node mass in the left neck compatible with metastatic disease. Additional small ill-defined lymph nodes the left could be due to tumor as well. 3. Poor dentition.  08/27/2020  Final Pathologic Diagnosis    A.  LEFT TONSIL, BIOPSY:              Poorly differentiated malignant neoplasm.              See comment.   Electronically signed by Lowella Bandy, MD on 08/13/2020 at 12:10 PM  Comment    This biopsy show the presence of a poorly differentiated malignant neoplasm located at the subepithelial stroma.  Immunostains with appropriate controls show that tumor cells are positive for vimentin and p16, and negative for pancytokeratin (AE1/3), low molecular weight cytokeratin, CK5/6, pan melanoma markers, SOX10, S100, desmin, CD34, CD30  and p40.  CD45, CD3 and CD20 are positive in some background lymphocytes and the tumor cells appear to be negative.  Ki67 is positive in about 40% of the tumor cells.  The findings are consistent with a poorly differentiated malignant neoplasm of undetermined classification.  This tumor shows negative reaction for multiple keratin markers, but a very poorly differentiated carcinoma still can not be entirely ruled.  Because of the negative reaction for multiple markers of melanoma, a malignant melanoma is less likely.  Dr. Sherran Needs also reviewed this case, and  believes that the findings in this biopsy are not diagnostic of lymphoma.  A malignant mesenchymal neoplasm is possible, but other types of malignant neoplasm can not be ruled out.  Since this is a relatively small biopsy, additional sampling of the lesion may probably provide additional findings to help further classification of this tumor.  Clinical correlation is recommended.    Dr. Tommas Olp reviewed this case and agrees with this diagnosis.     08/27/2020  IMPRESSION: 1. Left tonsillar mass is hypermetabolic and consistent with neoplasm. 2. Bulky necrotic left metastatic nodal disease. 3. No findings for metastatic disease involving the chest, abdomen/pelvis or bony structures.  Interval history  REVIEW OF SYSTEMS:   Constitutional: Denies fevers, chills or abnormal night sweats Eyes: Denies blurriness of vision, double vision or watery eyes Ears, nose, mouth, throat, and face: Denies mucositis or sore throat Respiratory: Denies cough, dyspnea or wheezes Cardiovascular: Denies palpitation, chest discomfort or lower extremity swelling Gastrointestinal:  Denies nausea, heartburn or change in bowel habits Skin: Denies abnormal skin rashes Lymphatics: Denies new lymphadenopathy or easy bruising Neurological:Denies numbness, tingling or new weaknesses Behavioral/Psych: Mood is stable, no new changes  All other systems were reviewed with the patient and are negative.  MEDICAL HISTORY:  Past Medical History:  Diagnosis Date  . DVT (deep venous thrombosis) ([place])   . Hypertension 1997   has never taken medications  . Tobacco dependence     SURGICAL HISTORY: Past Surgical History:  Procedure Laterality Date  . IR GASTROSTOMY TUBE MOD SED  09/11/2020  . IR IMAGING GUIDED PORT INSERTION  09/11/2020  . KNEE SURGERY  approx. 1984   reconstructive surgery of left knee; arthroscopic procedure about 1997  . PERIPHERAL VASCULAR CATHETERIZATION Left 06/16/2016   Procedure: Lower Extremity Venography- Venus Thrombolysis;  Surgeon: Serafina Mitchell, MD;  Location: [facility];  Service: Cardiovascular;  Laterality: Left;    SOCIAL HISTORY: Social History   Socioeconomic History  . Marital status: Married    Spouse name: Not on file  . Number of children: Not on file  . Years of education: Not on file  . Highest education level: Not on file  Occupational History  . Occupation: Architect  Tobacco Use  . Smoking  status: Current Some Day Smoker    Types: Cigarettes    Last attempt to quit: 11/21/2016    Years since quitting: 3.8  . Smokeless tobacco: Never Used  Vaping Use  . Vaping Use: Never used  Substance and Sexual Activity  . Alcohol use: Yes    Alcohol/week: 6.0 standard drinks    Types: 6 Cans of beer per week  . Drug use: No  . Sexual activity: Not Currently  Other Topics Concern  . Not on file  Social History Narrative  . Not on file   Social Determinants of Health   Financial Resource Strain: Low Risk   . Difficulty of Paying Living Expenses: Not very hard  Food Insecurity: No Food Insecurity  . Worried About Charity fundraiser in the Last Year: Never true  . Ran Out of Food in the Last Year: Never true  Transportation Needs: No Transportation Needs  . Lack of Transportation (Medical): No  . Lack of Transportation (Non-Medical): No  Physical Activity: Inactive  . Days of Exercise per Week: 0 days  . Minutes of Exercise per Session: 0 min  Stress: No Stress Concern Present  . Feeling of Stress : Only a little  Social Connections: Socially Integrated  .  Frequency of Communication with Friends and Family: More than three times a week  . Frequency of Social Gatherings with Friends and Family: More than three times a week  . Attends Religious Services: More than 4 times per year  . Active Member of Clubs or Organizations: Yes  . Attends Archivist Meetings: More than 4 times per year  . Marital Status: Married  Human resources officer Violence: Not on file    FAMILY HISTORY: Family History  Problem Relation Age of Onset  . CAD Mother 70    ALLERGIES:  has No Known Allergies.  MEDICATIONS:  Current Outpatient Medications  Medication Sig Dispense Refill  . amLODipine (NORVASC) 10 MG tablet Take 1 tablet (10 mg total) by mouth daily. 30 tablet 6  . dexamethasone (DECADRON) 4 MG tablet Take 2 tablets (8 mg total) by mouth daily. Take daily x 3 days starting the day  after cisplatin chemotherapy. Take with food. (Patient not taking: Reported on 09/19/2020) 30 tablet 1  . enoxaparin (LOVENOX) 100 MG/ML injection Inject 1 mL (100 mg total) into the skin 2 (two) times daily for 2 days. (Patient not taking: Reported on 09/19/2020) 3 mL 0  . lidocaine-prilocaine (EMLA) cream Apply to affected area once (Patient not taking: Reported on 09/19/2020) 30 g 3  . LORazepam (ATIVAN) 0.5 MG tablet Take 1 tablet (0.5 mg total) by mouth every 6 (six) hours as needed (Nausea or vomiting). (Patient not taking: Reported on 09/19/2020) 30 tablet 0  . ondansetron (ZOFRAN) 8 MG tablet Take 1 tablet (8 mg total) by mouth 2 (two) times daily as needed. Start on the third day after cisplatin chemotherapy. (Patient not taking: Reported on 09/19/2020) 30 tablet 1  . prochlorperazine (COMPAZINE) 10 MG tablet Take 1 tablet (10 mg total) by mouth every 6 (six) hours as needed (Nausea or vomiting). (Patient not taking: Reported on 09/19/2020) 30 tablet 1  . rivaroxaban (XARELTO) 20 MG TABS tablet Take 1 tablet (20 mg total) by mouth daily with supper. 30 tablet 6   No current facility-administered medications for this visit.     PHYSICAL EXAMINATION: ECOG PERFORMANCE STATUS: 0 - Asymptomatic  There were no vitals filed for this visit. There were no vitals filed for this visit.  GENERAL:alert, no distress and comfortable SKIN: skin color, texture, turgor are normal, no rashes or significant lesions EYES: normal, conjunctiva are pink and non-injected, sclera clear OROPHARYNX:no exudate, no erythema and lips, buccal mucosa, and tongue normal  NECK: supple, thyroid normal size, non-tender, without nodularity LYMPH:  no palpable lymphadenopathy in the cervical, axillary or inguinal LUNGS: clear to auscultation and percussion with normal breathing effort HEART: regular rate & rhythm and no murmurs and no lower extremity edema ABDOMEN:abdomen soft, non-tender and normal bowel sounds Musculoskeletal:no  cyanosis of digits and no clubbing  PSYCH: alert & oriented x 3 with fluent speech NEURO: no focal motor/sensory deficits  LABORATORY DATA:  I have reviewed the data as listed Lab Results  Component Value Date   WBC 5.6 09/11/2020   HGB 14.8 09/11/2020   HCT 44.5 09/11/2020   MCV 81.1 09/11/2020   PLT 227 09/11/2020     Chemistry      Component Value Date/Time   NA 140 09/03/2020 1534   NA 139 12/10/2019 1348   K 4.2 09/03/2020 1534   CL 106 09/03/2020 1534   CO2 25 09/03/2020 1534   BUN 14 09/03/2020 1534   BUN 17 12/10/2019 1348   CREATININE 1.17 09/03/2020 1534  Component Value Date/Time   CALCIUM 9.3 09/03/2020 1534   ALKPHOS 72 09/03/2020 1534   AST 12 (L) 09/03/2020 1534   ALT 12 09/03/2020 1534   BILITOT 0.4 09/03/2020 1534       RADIOGRAPHIC STUDIES: I have personally reviewed the radiological images as listed and agreed with the findings in the report. IR Gastrostomy Tube  Result Date: 09/11/2020 INDICATION: 60 year old with squamous cell carcinoma of the left tonsil. Request for a gastrostomy tube prior to chemotherapy and radiation treatment. EXAM: PERCUTANEOUS GASTROSTOMY TUBE WITH FLUOROSCOPIC GUIDANCE Physician: Stephan Minister. Anselm Pancoast, MD MEDICATIONS: Ancef 2 g; Antibiotics were administered within 1 hour of the procedure. Glucagon 1 mg IV ANESTHESIA/SEDATION: Versed 1.0 mg IV; Fentanyl 100 mcg IV Moderate Sedation Time:  20 minutes The patient was continuously monitored during the procedure by the interventional radiology nurse under my direct supervision. FLUOROSCOPY TIME:  Fluoroscopy Time: 2 minutes, 42 seconds, 69 mGy COMPLICATIONS: None immediate. PROCEDURE: The procedure was explained to the patient. The risks and benefits of the procedure were discussed and the patient's questions were addressed. Informed consent was obtained from the patient. The patient was placed on the interventional table. Fluoroscopy demonstrated oral contrast in the transverse colon. An  nasogastric tube was placed with fluoroscopic guidance. The anterior abdomen was prepped and draped in sterile fashion. Maximal barrier sterile technique was utilized including caps, mask, sterile gowns, sterile gloves, sterile drape, hand hygiene and skin antiseptic. Stomach was inflated with air through the orogastric tube. The skin and subcutaneous tissues were anesthetized with 1% lidocaine. Small incision was made. A needle with a T-fastener was advanced into the stomach using fluoroscopy. T fastener was deployed without complication. Total of 3 T-fasteners were deployed in a triangular configuration for the gastropexy. Small incision was made between the T-fasteners. Needle was directed through this incision into the stomach using fluoroscopy. Wire was advanced into the stomach. The tract was dilated to accommodate a 22 French peel-away sheath. A 20 French Entuit gastrostomy tube was advanced over the wire and through the peel-away sheath. Peel-away sheath was removed. The retention balloon was inflated with 10 mL of saline containing a small amount of contrast. Contrast was injected through the lumen to confirm placement in the stomach. Gastrostomy tube was flushed with saline. FINDINGS: Gastropexy was performed with 3 T-fasteners situated in a triangular configuration. 5 French gastrostomy tube was placed between the T-fasteners. Contrast injection confirmed placement in the stomach. IMPRESSION: 1. Successful placement of a percutaneous gastrostomy tube using fluoroscopy. Patient has a 13 [place] balloon retention gastrostomy tube. 2. Patient will return to Interventional Radiology in 10-14 days for removal of the gastropexy T-fasteners. Electronically Signed   By: Markus Daft M.D.   On: 09/11/2020 14:57   NM PET Image Initial (PI) Skull Base To Thigh  Addendum Date: 09/17/2020   ADDENDUM REPORT: 09/17/2020 09:14 ADDENDUM: Imaging was reviewed at otolaryngology tumor Board today. PET-CT of 08/27/2020  demonstrates a 7 mm lymph node in the right neck, posterior to the sternocleidomastoid muscle which shows mild increased uptake on PET imaging. Axial CT image 33. Fused PET image 200. This is suspicious for contralateral tumor spread. Electronically Signed   By: Franchot Gallo M.D.   On: 09/17/2020 09:14   Result Date: 09/17/2020 CLINICAL DATA:  Initial treatment strategy for left tonsillar carcinoma. EXAM: NUCLEAR MEDICINE PET SKULL BASE TO THIGH TECHNIQUE: 10.95 mCi F-18 FDG was injected intravenously. Full-ring PET imaging was performed from the skull base to thigh after the radiotracer. CT  data was obtained and used for attenuation correction and anatomic localization. Fasting blood glucose: 107 mg/dl COMPARISON:  Neck CT 07/23/2020 FINDINGS: Mediastinal blood pool activity: SUV max 2.77 Liver activity: SUV max NA NECK: Large left tonsillar mass appears to involve the uvula. SUV max is 10.37. Extensive necrotic left neck adenopathy is also hypermetabolic with SUV max of 4.40. Smaller posterior cervical lymph nodes are also mildly hypermetabolic with SUV max of 1.02. Incidental CT findings: none CHEST: No hypermetabolic mediastinal or hilar nodes. No suspicious pulmonary nodules on the CT scan. Incidental CT findings: none ABDOMEN/PELVIS: No abnormal hypermetabolic activity within the liver, pancreas, adrenal glands, or spleen. No hypermetabolic lymph nodes in the abdomen or pelvis. Incidental CT findings: Low-attenuation adrenal gland lesions consistent with benign adenomas. Scattered atherosclerotic calcifications. A left common iliac vein stent is noted. SKELETON: No focal hypermetabolic activity to suggest skeletal metastasis. Incidental CT findings: none IMPRESSION: 1. Left tonsillar mass is hypermetabolic and consistent with neoplasm. 2. Bulky necrotic left metastatic nodal disease. 3. No findings for metastatic disease involving the chest, abdomen/pelvis or bony structures. Electronically Signed: By: Marijo Sanes M.D. On: 08/27/2020 16:28   IR IMAGING GUIDED PORT INSERTION  Result Date: 09/11/2020 INDICATION: 60 year old with squamous cell carcinoma of the left tonsil. Port-A-Cath needed for chemotherapy. EXAM: FLUOROSCOPIC AND ULTRASOUND GUIDED PLACEMENT OF A SUBCUTANEOUS PORT COMPARISON:  None. MEDICATIONS: Moderate sedation ANESTHESIA/SEDATION: Versed 3.0 mg IV; Fentanyl 100 mcg IV; Moderate Sedation Time:  24 minutes The patient was continuously monitored during the procedure by the interventional radiology nurse under my direct supervision. FLUOROSCOPY TIME:  30 seconds, 5 mGy COMPLICATIONS: None immediate. PROCEDURE: The procedure, risks, benefits, and alternatives were explained to the patient. Questions regarding the procedure were encouraged and answered. The patient understands and consents to the procedure. Patient was placed supine on the interventional table. Ultrasound confirmed a patent right internal jugular vein. Ultrasound image was saved for documentation. The right chest and neck were cleaned with a skin antiseptic and a sterile drape was placed. Maximal barrier sterile technique was utilized including caps, mask, sterile gowns, sterile gloves, sterile drape, hand hygiene and skin antiseptic. The right neck was anesthetized with 1% lidocaine. Small incision was made in the right neck with a blade. Micropuncture set was placed in the right internal jugular vein with ultrasound guidance. The micropuncture wire was used for measurement purposes. The right chest was anesthetized with 1% lidocaine with epinephrine. #15 blade was used to make an incision and a subcutaneous port pocket was formed. [place] was assembled. Subcutaneous tunnel was formed with a stiff tunneling device. The port catheter was brought through the subcutaneous tunnel. The port was placed in the subcutaneous pocket. The micropuncture set was exchanged for a peel-away sheath. The catheter was placed through the  peel-away sheath and the tip was positioned at the superior cavoatrial junction. Catheter placement was confirmed with fluoroscopy. The port was accessed and flushed with heparinized saline. The port pocket was closed using two layers of absorbable sutures and Dermabond. The vein skin site was closed using a single layer of absorbable suture and Dermabond. Sterile dressings were applied. Patient tolerated the procedure well without an immediate complication. Ultrasound and fluoroscopic images were taken and saved for this procedure. IMPRESSION: Placement of a subcutaneous port device. Catheter tip at the superior cavoatrial junction. Electronically Signed   By: Markus Daft M.D.   On: 09/11/2020 14:49   I independently reviewed PET imaging from outside facility  All questions  were answered. The patient knows to call the clinic with any problems, questions or concerns. I spent over 60 minutes in the care of this patient including H and P, review of records, counseling and coordination of care.     Benay Pike, MD 09/25/2020 8:48 AM

## 2020-09-25 NOTE — Discharge Instructions (Signed)
[facility] Dr. Madison B. Owsley, DMD Phone: [phone] Fax: [phone]    MOUTH CARE AFTER SURGERY   FACTS:  Ice used in ice bag helps keep the swelling down, and can help lessen the pain.  It is easier to treat pain BEFORE it happens.  Spitting disturbs the clot and may cause bleeding to start again, or to get worse.  Smoking delays healing and can cause complications.  Sharing prescriptions can be dangerous.  Do not take medications not recently prescribed for you.  Antibiotics may stop birth control pills from working.  Use other means of birth control while on antibiotics.  Warm salt water rinses after the first 24 hours will help lessen the swelling:  Use 1/2 teaspoonful of table salt per oz.of water.  DO NOT:  Do not spit.    Do not drink through a straw.  Strongly advised not to smoke, dip snuff or chew tobacco at least for 3 days.  Do not eat sharp or crunchy foods.  Avoid the area of surgery when chewing.  Do not stop your antibiotics before your instructions say to do so.  Do not eat hot foods until bleeding has stopped.  If you need to, let your food cool down to room temperature.  EXPECT:  Some swelling, especially first 2-3 days.  Soreness or discomfort in varying degrees.  Follow your dentist's instructions about how to handle pain before it starts.  Pinkish saliva or light blood in saliva, or on your pillow in the morning.  This can last around 24 hours.  Bruising inside or outside the mouth.  This may not show up until 2-3 days after surgery.  Don't worry, it will go away in time.  Pieces of "bone" may work themselves loose.  It's OK.  If they bother you, let us know.  WHAT TO DO IMMEDIATELY AFTER SURGERY:  Bite on gauze with steady pressure for 30-45 minutes at a time.  Switch out the gauze after 30-45 minutes for clean gauze, and continue this for 1-2 hours or until bleeding subsides.  Do not chew on the  gauze.  Do not lie down flat.  Raise your head support especially for the first 24 hours.  Apply ice to your face on the side of the surgery.  You may apply it 20 minutes on and a few minutes off.  Ice for 8-12 hours.  You may use ice up to 24 hours.  Before the numbness wears off, take a pain pill as instructed.  Prescription pain medication is not always required.  SWELLING:  Expect swelling for the first couple of days.  It should get better after that.  If swelling increases 3 days or so after surgery, let us know as soon as possible.  FEVER:  Take Tylenol every 4 hours if needed to lower your temperature, especially if it is at 100F or higher.  Drink lots of fluids.  If the fever does not go away, let us know.  BREATHING TROUBLE:  Any unusual difficulty breathing means you have to have someone bring you to the emergency room ASAP.  BLEEDING:  Light oozing is expected for 24 hours or so.  Prop head up with pillows.  Do not spit.  Do not confuse bright red fresh flowing blood with lots of saliva colored with a little bit of blood.  If you notice some bleeding, place gauze or a tea bag where it is bleeding and apply CONSTANT pressure by biting   down for 1 hour.  Avoid talking during this time.  Do not remove the gauze or tea bag during this hour to "check" the bleeding.  If you notice bright RED bleeding FLOWING out of particular area, and filling the floor of your mouth, put a wad of gauze on that area, bite down firmly and constantly.  Call us immediately.  If we're closed, have someone bring you to the emergency room.  ORAL HYGIENE:  Brush your teeth as usual after meals and before bedtime.  Use a soft toothbrush around the area of surgery.  DO NOT AVOID BRUSHING.  Otherwise bacteria(germs) will grow and may delay healing or encourage infection.  Since you cannot spit, just gently rinse and let the water flow out of your mouth.  DO NOT SWISH  HARD.  EATING:  Cool liquids are a good point to start.  Increase to soft foods as tolerated.  PRESCRIPTIONS:  Follow the directions for your prescriptions exactly as written.  If your doctor gave you a narcotic pain medication, do not drive, operate machinery or drink alcohol when on that medication.  QUESTIONS:  Call our office during office hours [phone] or call the Emergency Room at [phone]. 

## 2020-09-25 NOTE — Anesthesia Procedure Notes (Signed)
Procedure Name: Intubation Date/Time: 09/25/2020 2:37 PM Performed by: Babs Bertin, CRNA Pre-anesthesia Checklist: Patient identified, Emergency Drugs available, Suction available and Patient being monitored Patient Re-evaluated:Patient Re-evaluated prior to induction Oxygen Delivery Method: Circle System Utilized Preoxygenation: Pre-oxygenation with 100% oxygen Induction Type: IV induction Ventilation: Mask ventilation without difficulty and Oral airway inserted - appropriate to patient size Laryngoscope Size: Glidescope Grade View: Grade III Nasal Tubes: Nasal Rae and Nasal prep performed Tube size: 7.5 mm Number of attempts: 1 Placement Confirmation: ETT inserted through vocal cords under direct vision,  positive ETCO2 and breath sounds checked- equal and bilateral Tube secured with: Tape Dental Injury: Teeth and Oropharynx as per pre-operative assessment

## 2020-09-25 NOTE — Progress Notes (Signed)
Pt is approved for the $1000 [employer] grant.  

## 2020-09-25 NOTE — Anesthesia Preprocedure Evaluation (Addendum)
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 Left ventricular ejection fraction, by estimation, is 60 to 65%. The  left ventricle has normal function. The left ventricle has no regional  wall motion abnormalities. Left ventricular diastolic parameters were  normal.  2. Right ventricular systolic function is normal. The right ventricular  size is normal. There is normal pulmonary artery systolic pressure.  3. The mitral valve is normal in structure. No evidence of mitral valve  regurgitation. No evidence of mitral stenosis.  4. The aortic valve is tricuspid. Aortic valve regurgitation is not  visualized. Mild aortic valve sclerosis is present, with no evidence of  aortic valve stenosis.  5. The inferior vena cava is normal in size with greater than 50%  respiratory variability, suggesting right atrial pressure of 3 mmHg.    Neuro/Psych negative neurological ROS  negative psych ROS   GI/Hepatic negative GI ROS, Neg liver ROS,   Endo/Other  negative endocrine ROS  Renal/GU CRFRenal disease  negative genitourinary   Musculoskeletal  (+) Arthritis , Osteoarthritis,    Abdominal Normal abdominal exam  (+)   Peds negative pediatric ROS (+)  Hematology negative hematology ROS (+)   Anesthesia Other  Findings H/o tonsillar cancer  Reproductive/Obstetrics negative OB ROS                            Anesthesia Physical Anesthesia Plan  ASA: III  Anesthesia Plan: General   Post-op Pain Management:    Induction: Intravenous  PONV Risk Score and Plan: 1 and Midazolam, Treatment may vary due to age or medical condition and Ondansetron  Airway Management Planned: Video Laryngoscope Planned and Nasal ETT  Additional Equipment: None  Intra-op Plan:   Post-operative Plan: Extubation in OR  Informed Consent: I have reviewed the patients History and Physical, chart, labs and discussed the procedure including the risks, benefits and alternatives for the proposed anesthesia with the patient or authorized representative who has indicated his/her understanding and acceptance.       Plan Discussed with: Anesthesiologist and CRNA  Anesthesia Plan Comments:        Anesthesia Quick Evaluation

## 2020-09-25 NOTE — Op Note (Signed)
Department of Dental Medicine    OPERATIVE REPORT  DATE OF SURGERY:  09/25/2020  PATIENT NAME:   Mitchell Cooper DATE OF BIRTH:   11-03-1960 MEDICAL RECORD NUMBER: [id number]  SURGEON:  Ariz Terrones B. Benson [place], D.M.D.  ASSISTANT:  Molli Posey, DAII  PREOPERATIVE DIAGNOSES:  Dental caries, periodontal disease Patient Active Problem List   Diagnosis Date Noted  . Carcinoma of tonsillar fossa ([place]) 09/12/2020  . 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 Tonsillar mass 08/08/2020  . Mass of left side of neck 07/09/2020  . Former smoker 04/08/2020  . Primary osteoarthritis of both knees 04/08/2020  . VTE (venous thromboembolism) 03/19/2020  . RVF (right ventricular failure) ([place]) 12/07/2019  . Recurrent pulmonary emboli ([place]) 11/03/2019  . 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 History of pulmonary embolus (PE) 06/14/2016  . Essential hypertension 06/14/2016  . CKD (chronic kidney disease) stage 3, GFR 30-59 ml/min ([facility]) 06/14/2016  . Hyperglycemia 06/14/2016    POSTOPERATIVE DIAGNOSES: Same  PROCEDURES PERFORMED: 1. Extractions of teeth numbers 2, 14, 15, 16, 17, 19, 30 and 31. 2. 2 Quadrants of alveoloplasty (UL and LL)  ANESTHESIA: General anesthesia via nasal endotracheal tube.  MEDICATIONS: 1. Ancef 2 g IV prior to invasive dental procedures. 2. Local anesthesia with a total utilization of 3 cartridges of 34 mg of lidocaine with 0.018 mg of epinephrine/ea.  SPECIMENS: 8 teeth that were extracted and discarded.  DRAINS/CULTURES: None  COMPLICATIONS: None  ESTIMATED BLOOD LOSS: 5 mL  INTRAVENOUS FLUIDS: 10 mL of Lactated ringers solution.  INDICATION: The patient was recently diagnosed with SCC of the left tonsil.  A medically necessary dental consult was then requested to evaluate the patient for any dental/orofacial infection and their overall oral health.  The patient was examined and subsequently treatment planned for multiple extractions and  alveoloplasty as needed to eliminate active infection and/or teeth in the field of high radiation.  This treatment plan was made to decrease the perioperative and postoperative risks and complications associated with dental/orofacial infection from affecting the patient's systemic health.  OPERATIVE FINDINGS:  The patient was examined in operating room number 9.  The indicated teeth were identified and verified for extraction. The patient was noted be affected by severe dental decay, chronic periodontitis and chronic apical periodontitis.  DESCRIPTION OF PROCEDURE: The patient was identified in the holding area and brought to the main operating room number 9 by the anesthesia team. The patient was then placed in the supine position on the operating table.  General anesthesia was then induced per the anesthesia team. The patient was then prepped and draped in the usual sterile fashion for dental medicine procedures.  A timeout was performed. The patient was identified and procedures were verified. A throat pack was placed at this time. The oral cavity was then thoroughly examined with the findings noted above. The patient was then ready for the dental medicine procedure as follows:  ROUTINE EXTRACTIONS: Local anesthesia was administered sequentially with a total utilization of 3 cartridges each containing 34 mg of lidocaine with 0.018 mg of epinephrine. Location of anesthesia included upper right and left quadrants: infiltration of indicated teeth and palatal, lower right and left quadrants: infiltration of indicated teeth, lingual infiltration and lower left long buccal block and mental block.  The maxillary left and right quadrants were first approached. The teeth were then subluxated with a series of straight elevators. Tooth numbers 2, 14, 15 and 16 were then removed with a 150 forceps and ronguers  without complications. Alveoloplasty was then performed in the upper left quadrant utilizing a ronguers and  bone file. The surgical sites were then irrigated with copious amounts of sterile saline. The tissues were approximated and trimmed appropriately. Surgi Foam was placed in each extraction site. The surgical sites were closed using 3-0 chromic gut sutures as follows: #14, #15 and #16 extraction sites 2 figure-8 style sutures and 1 simple interrupted.  The mandibular left and right quadrants were then approached. The teeth were subluxated with a series of straight elevators. Tooth numbers 17, 30 and 31 were then removed utilizing a 151 forceps and ronguers. Alveoloplasty was then performed on the lower left quadrant utilizing a rongeurs and bone file to help achieve primary closure. The tissues were approximated and trimmed appropriately. The surgical sites were then irrigated with copious amounts of sterile saline. Surgi Foam was placed in each extraction site. The surgical sites were closed using 3-0 chromic gut sutures as follows: #31 extraction site 2 simple interrupted.  SURGICAL EXTRACTIONS: Routine forceps extraction was attempted on tooth number 19 as described above.  Tooth number 19 broke off to the gingival tissue margin. A FTMP flap was then carefully reflected with the periosteal elevator. A surgical handpiece with copious sterile irrigation was used to remove interseptal bone and buccal bone. Tooth number 19 was extracted using molt 9 and east/west elevators without complications.  Alveoloplasty was then performed utilizing a rongeurs and bone file to help achieve primary closure. The tissues were approximated and trimmed appropriately. The surgical sites were then irrigated with copious amounts of sterile saline. Surgi Foam was placed in the extraction site. The surgical sites were closed using 3-0 chromic gut sutures as follows: Figure-8 style suture.  END OF PROCEDURE: Thorough oral irrigation with sterile saline was performed. The patient was examined for complications, and seeing none, the  dental medicine procedure was deemed to be complete. The throat pack was removed at this time. An oral airway was then placed at the request of the anesthesia team. A series of 4 x 4 gauze were placed in the mouth to aid hemostasis as needed. The patient was then handed over to the anesthesia team for final disposition. After an appropriate amount of time, the patient was extubated and taken to the postanesthsia care unit in stable condition. All counts were correct for the dental medicine procedure.   Lynden Benson Norway, D.M.D.

## 2020-09-25 NOTE — Anesthesia Postprocedure Evaluation (Signed)
Anesthesia Post Note  Patient: Mitchell Cooper  Procedure(s) Performed: MULTIPLE EXTRACTIONS WITH ALVEOLOPLASTY (N/A Mouth)     Patient location during evaluation: PACU Anesthesia Type: General Level of consciousness: sedated Pain management: pain level controlled Vital Signs Assessment: post-procedure vital signs reviewed and stable Respiratory status: spontaneous breathing and respiratory function stable Cardiovascular status: stable Postop Assessment: no apparent nausea or vomiting Anesthetic complications: no   No complications documented.  Last Vitals:  Vitals:   09/25/20 1214  BP: (!) 145/71  Pulse: 80  Resp: 18  Temp: 36.5 C  SpO2: 98%    Last Pain:  Vitals:   09/25/20 1308  TempSrc:   PainSc: 0-No pain                 Merlinda Frederick

## 2020-09-26 ENCOUNTER — Other Ambulatory Visit (HOSPITAL_COMMUNITY): Payer: Medicaid Other

## 2020-09-26 ENCOUNTER — Encounter (HOSPITAL_COMMUNITY): Payer: Self-pay | Admitting: Dentistry

## 2020-09-26 ENCOUNTER — Other Ambulatory Visit (HOSPITAL_COMMUNITY): Payer: Self-pay | Admitting: Dentistry

## 2020-09-26 DIAGNOSIS — K029 Dental caries, unspecified: Secondary | ICD-10-CM

## 2020-09-26 DIAGNOSIS — K0530 Chronic periodontitis, unspecified: Secondary | ICD-10-CM

## 2020-09-26 MED ORDER — HYDROCODONE-ACETAMINOPHEN 5-325 MG PO TABS
1.0000 | ORAL_TABLET | Freq: Four times a day (QID) | ORAL | 0 refills | Status: AC | PRN
Start: 2020-09-26 — End: 2020-09-29

## 2020-09-29 ENCOUNTER — Other Ambulatory Visit: Payer: Self-pay

## 2020-09-29 ENCOUNTER — Other Ambulatory Visit: Payer: Self-pay | Admitting: Hematology and Oncology

## 2020-09-29 ENCOUNTER — Ambulatory Visit
Admission: RE | Admit: 2020-09-29 | Discharge: 2020-09-29 | Disposition: A | Discharge status: Home / Self Care | Payer: Medicaid Other | Source: Ambulatory Visit | Attending: Radiation Oncology | Admitting: Radiation Oncology

## 2020-09-29 VITALS — BP 145/90 | HR 106 | Temp 98.2°F | Resp 17

## 2020-09-29 DIAGNOSIS — Z510 Encounter for antineoplastic radiation therapy: Secondary | ICD-10-CM | POA: Insufficient documentation

## 2020-09-29 DIAGNOSIS — C099 Malignant neoplasm of tonsil, unspecified: Secondary | ICD-10-CM

## 2020-09-29 DIAGNOSIS — C090 Malignant neoplasm of tonsillar fossa: Secondary | ICD-10-CM | POA: Insufficient documentation

## 2020-09-29 MED ORDER — SODIUM CHLORIDE 0.9% FLUSH
10.0000 mL | Freq: Once | INTRAVENOUS | Status: AC
  Administered 2020-09-29: 10 mL via INTRAVENOUS

## 2020-09-29 MED ORDER — DEXAMETHASONE 4 MG PO TABS: 8.0000 mg | ORAL_TABLET | Freq: Every day | ORAL | 1 refills | Status: DC

## 2020-09-29 MED ORDER — LIDOCAINE-PRILOCAINE 2.5-2.5 % EX CREA: TOPICAL_CREAM | CUTANEOUS | 3 refills | Status: DC

## 2020-09-29 MED ORDER — PROCHLORPERAZINE MALEATE 10 MG PO TABS: 10.0000 mg | ORAL_TABLET | Freq: Four times a day (QID) | ORAL | 1 refills | Status: DC | PRN

## 2020-09-29 MED ORDER — LORAZEPAM 0.5 MG PO TABS: 0.5000 mg | ORAL_TABLET | Freq: Four times a day (QID) | ORAL | 0 refills | Status: DC | PRN

## 2020-09-29 MED ORDER — ONDANSETRON HCL 8 MG PO TABS: 8.0000 mg | ORAL_TABLET | Freq: Two times a day (BID) | ORAL | 1 refills | Status: DC | PRN

## 2020-09-29 NOTE — Progress Notes (Signed)
Has armband been applied?  Yes.    Does patient have an allergy to IV contrast dye?: No.   Has patient ever received premedication for IV contrast dye?: No.   Does patient take metformin?: No.  Date of lab work: September 03, 2020 BUN: 14 CR: 1.17  IV site: antecubital right, condition patent and no redness  Has IV site been added to flowsheet?  Yes.    BP (!) 145/90 (BP Location: Right Arm, Patient Position: Sitting)   Pulse (!) 106   Temp 98.2 F (36.8 C) (Oral)   Resp 17   SpO2 99%

## 2020-09-29 NOTE — Progress Notes (Signed)
Oncology Nurse Navigator Documentation  To provide support, encouragement and care continuity, met with Mr. Mitchell Cooper during his CT SIM.  He tolerated procedure without difficulty, denied questions/concerns.   I toured him to [facility] treatment area, explained procedures for lobby registration, arrival to Radiation Waiting, and arrival to treatment area.  He voiced understanding. He received a calendar with his appointments today. I wrote down his appointments scheduled for Thursday 3/17. He is aware that he will see Dr. Chryl Heck first then come downstairs to see SLP and PT in head and neck M[facility].  I encouraged him to call me for any questions or concerns.  Harlow Asa RN, BSN, OCN Head & Neck Oncology Nurse [place] [facility] Phone # [phone]  Fax # [phone]

## 2020-09-29 NOTE — Progress Notes (Signed)
ON PATHWAY REGIMEN - Head and Neck  No Change  Continue With Treatment as Ordered.  Original Decision Date/Time: 09/03/2020 17:05     A cycle is every 7 days:     Cisplatin   **Always confirm dose/schedule in your pharmacy ordering system**  Patient Characteristics: Oropharynx, HPV Positive, Preoperative or Nonsurgical Candidate (Clinical Staging), cT0-4, cN1-3 or cT3-4, cN0 Disease Classification: Oropharynx HPV Status: Positive (+) Therapeutic Status: Preoperative or Nonsurgical Candidate (Clinical Staging) AJCC T Category: cT2 AJCC 8 Stage Grouping: III AJCC N Category: cN3 AJCC M Category: cM0 Intent of Therapy: Curative Intent, Discussed with Patient

## 2020-10-01 ENCOUNTER — Ambulatory Visit: Payer: Medicaid Other

## 2020-10-01 ENCOUNTER — Ambulatory Visit: Payer: Medicaid Other | Admitting: Hematology and Oncology

## 2020-10-01 ENCOUNTER — Other Ambulatory Visit: Payer: Medicaid Other

## 2020-10-02 ENCOUNTER — Ambulatory Visit: Payer: Medicaid Other | Attending: Radiation Oncology | Admitting: Physical Therapy

## 2020-10-02 ENCOUNTER — Ambulatory Visit: Payer: Medicaid Other

## 2020-10-02 ENCOUNTER — Other Ambulatory Visit: Payer: Self-pay

## 2020-10-02 ENCOUNTER — Inpatient Hospital Stay: Payer: Medicaid Other | Attending: Hematology and Oncology | Admitting: Hematology and Oncology

## 2020-10-02 ENCOUNTER — Other Ambulatory Visit: Payer: Medicaid Other

## 2020-10-02 ENCOUNTER — Encounter: Payer: Self-pay | Admitting: Hematology and Oncology

## 2020-10-02 VITALS — BP 124/76 | HR 94 | Temp 97.8°F | Resp 17 | Ht 69.0 in | Wt 215.9 lb

## 2020-10-02 DIAGNOSIS — F17210 Nicotine dependence, cigarettes, uncomplicated: Secondary | ICD-10-CM | POA: Insufficient documentation

## 2020-10-02 DIAGNOSIS — C099 Malignant neoplasm of tonsil, unspecified: Secondary | ICD-10-CM | POA: Diagnosis not present

## 2020-10-02 DIAGNOSIS — Z7901 Long term (current) use of anticoagulants: Secondary | ICD-10-CM | POA: Diagnosis not present

## 2020-10-02 DIAGNOSIS — Z79899 Other long term (current) drug therapy: Secondary | ICD-10-CM | POA: Diagnosis not present

## 2020-10-02 DIAGNOSIS — Z86711 Personal history of pulmonary embolism: Secondary | ICD-10-CM | POA: Diagnosis not present

## 2020-10-02 DIAGNOSIS — I2699 Other pulmonary embolism without acute cor pulmonale: Secondary | ICD-10-CM | POA: Diagnosis not present

## 2020-10-02 DIAGNOSIS — Z8249 Family history of ischemic heart disease and other diseases of the circulatory system: Secondary | ICD-10-CM | POA: Insufficient documentation

## 2020-10-02 DIAGNOSIS — Z86718 Personal history of other venous thrombosis and embolism: Secondary | ICD-10-CM | POA: Diagnosis not present

## 2020-10-02 DIAGNOSIS — I10 Essential (primary) hypertension: Secondary | ICD-10-CM | POA: Insufficient documentation

## 2020-10-02 DIAGNOSIS — Z5111 Encounter for antineoplastic chemotherapy: Secondary | ICD-10-CM | POA: Insufficient documentation

## 2020-10-02 DIAGNOSIS — Z7952 Long term (current) use of systemic steroids: Secondary | ICD-10-CM | POA: Insufficient documentation

## 2020-10-02 DIAGNOSIS — C090 Malignant neoplasm of tonsillar fossa: Secondary | ICD-10-CM

## 2020-10-02 DIAGNOSIS — R1310 Dysphagia, unspecified: Secondary | ICD-10-CM | POA: Insufficient documentation

## 2020-10-02 MED ORDER — LIDOCAINE-PRILOCAINE 2.5-2.5 % EX CREA: TOPICAL_CREAM | CUTANEOUS | 3 refills | Status: DC

## 2020-10-02 MED ORDER — ONDANSETRON HCL 8 MG PO TABS: 8.0000 mg | ORAL_TABLET | Freq: Two times a day (BID) | ORAL | 1 refills | Status: DC | PRN

## 2020-10-02 MED ORDER — PROCHLORPERAZINE MALEATE 10 MG PO TABS: 10.0000 mg | ORAL_TABLET | Freq: Four times a day (QID) | ORAL | 1 refills | Status: DC | PRN

## 2020-10-02 NOTE — Progress Notes (Signed)
[facility] NOTE  Patient Care Team: Ladell Pier, MD as PCP - General (Internal Medicine)  CHIEF COMPLAINTS/PURPOSE OF CONSULTATION:  New diagnosis of left tonsillar cancer.  ASSESSMENT & PLAN:  No problem-specific Assessment & Plan notes found for this encounter.  No orders of the defined types were placed in this encounter.  1. SCC left tonsil, T2 N3 M0/ Stage III, P16 positive Repeat biopsy suggested poorly differentiated SCC. Given the above information and very locally advanced disease, we decided to proceed with concurrent chemoradiation with weekly cisplatin. Discussed adverse effects with cisplatin in previous visit No baseline hearing issues. He understands that some of the side effects with chemo can be permanent. His teeth extraction sites appear to be healing well, so we will proceed with cisplatin as planned on 3.24.2022 Labs, office visit and infusion scheduled per treatment plan.  2.  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 No indication for hypercoagulable work up No new concerns with chest pain, SOB.  3. Dental extraction, healing well, no concerns.    HISTORY OF PRESENTING ILLNESS:  Mitchell Cooper 60 y.o. male is here because of new diagnosis of tonsil cancer  Mitchell Cooper is a 60 y.o. male who presents as a consultation at the request of Dr.Bates with new diagnosis of poorly differentiated carcinoma noted on tonsilar biopsy. He initially presented with a chief complaint of neck mass going on for more than one year, care delayed partly by the pandemic. His neck mass continued to progress in size. He otherwise denied any dysphagia, speech issues, otalgia or weight loss He had two episodes of PE in 2018 and 2021 and is on indefinite anticoagulation with xarelto. According to patient there was  no clear etiology for these PE  Except for long periods of immobilization for work. He otherwise has HTN, well controlled on norvasc He is not a heavy smoker, smoked a pack in a week or so, quit smoking in 2018, resumed in 2021. He was a Architect worked by occupation.  Imaging and work up done so far.  07/23/2020  IMPRESSION: 1. 2.4 x 4.7 cm mass in the left oropharynx which appears to be arising from the soft palate extending inferiorly. Findings compatible with carcinoma. 2. Large necrotic lymph node mass in the left neck compatible with metastatic disease. Additional small ill-defined lymph nodes the left could be due to tumor as well. 3. Poor dentition.  08/27/2020  Final Pathologic Diagnosis    A.  LEFT TONSIL, BIOPSY:              Poorly differentiated malignant neoplasm.              See comment.   Electronically signed by Lowella Bandy, MD on 08/13/2020 at 12:10 PM  Comment    This biopsy show the presence of a poorly differentiated malignant neoplasm located at the subepithelial stroma.  Immunostains with appropriate controls show that tumor cells are positive for vimentin and p16, and negative for pancytokeratin (AE1/3), low molecular weight cytokeratin, CK5/6, pan melanoma markers, SOX10, S100, desmin, CD34, CD30  and p40.  CD45, CD3 and CD20 are positive in some background lymphocytes and the tumor cells appear to be negative.  Ki67 is positive in about 40% of the tumor cells.  The findings are consistent with a poorly differentiated malignant neoplasm of undetermined classification.  This tumor shows negative reaction for multiple keratin markers, but a very poorly differentiated carcinoma still can not be entirely ruled.  Because of the negative reaction for multiple markers of melanoma, a malignant melanoma is less likely.  Dr. Sherran Needs also reviewed this case, and believes that the findings in this biopsy are not diagnostic of lymphoma.  A malignant mesenchymal neoplasm is  possible, but other types of malignant neoplasm can not be ruled out.  Since this is a relatively small biopsy, additional sampling of the lesion may probably provide additional findings to help further classification of this tumor.  Clinical correlation is recommended.    Dr. Tommas Olp reviewed this case and agrees with this diagnosis.    08/27/2020  IMPRESSION: 1. Left tonsillar mass is hypermetabolic and consistent with neoplasm. 2. Bulky necrotic left metastatic nodal disease. 3. No findings for metastatic disease involving the chest, abdomen/pelvis or bony structures.  Interval history Since last visit, he had another biopsy done. Biopsy showed likely poorly differentiated SCC P 16 positive. He denies any discomfort with dysphagia or odynophagia. He denies any otalgia. He is able to eat all soft foods.  He is able to maintain his weight.  He denies any changes in breathing, bowel habits or urinary habits.  Dental extractions have been to be healing well according to the patient. Rest of the pertinent 10 point ROS reviewed and negative.  MEDICAL HISTORY:  Past Medical History:  Diagnosis Date  . Cancer ([place])    Left Tonsillar Cancer   . DVT (deep venous thrombosis) ([place])   . Hypertension 1997   has never taken medications  . Tobacco dependence     SURGICAL HISTORY: Past Surgical History:  Procedure Laterality Date  . IR GASTROSTOMY TUBE MOD SED  09/11/2020  . IR IMAGING GUIDED PORT INSERTION  09/11/2020  . KNEE SURGERY  approx. 1984   reconstructive surgery of left knee; arthroscopic procedure about 1997  . MULTIPLE EXTRACTIONS WITH ALVEOLOPLASTY N/A 09/25/2020   Procedure: MULTIPLE EXTRACTIONS WITH ALVEOLOPLASTY;  Surgeon: Charlaine Dalton, DMD;  Location: [facility];  Service: Dentistry;  Laterality: N/A;  . PERIPHERAL VASCULAR CATHETERIZATION Left 06/16/2016   Procedure: Lower Extremity Venography- Venus Thrombolysis;  Surgeon: Serafina Mitchell, MD;  Location: [facility];  Service: Cardiovascular;  Laterality: Left;    SOCIAL HISTORY: Social History   Socioeconomic History  . Marital status: Married    Spouse name: Not on file  . Number of children: Not on file  . Years of education: Not on file  . Highest education level: Not on file  Occupational History  . Occupation: Architect  Tobacco Use  . Smoking status: Current Some Day Smoker    Types: Cigarettes    Last attempt to quit: 11/21/2016    Years since quitting: 3.8  . Smokeless tobacco: Never Used  Vaping Use  . Vaping Use: Never used  Substance and Sexual Activity  . Alcohol use: Yes    Alcohol/week: 6.0 standard drinks    Types: 6 Cans of beer per week  . Drug use: No  . Sexual activity: Not Currently  Other Topics Concern  . Not on file  Social History Narrative  . Not on file   Social Determinants of Health   Financial Resource Strain: Low Risk   . Difficulty of Paying Living Expenses: Not very hard  Food Insecurity: No Food Insecurity  . Worried About Charity fundraiser in the Last Year: Never true  . Ran  Out of Food in the Last Year: Never true  Transportation Needs: No Transportation Needs  . Lack of Transportation (Medical): No  . Lack of Transportation (Non-Medical): No  Physical Activity: Inactive  . Days of Exercise per Week: 0 days  . Minutes of Exercise per Session: 0 min  Stress: No Stress Concern Present  . Feeling of Stress : Only a little  Social Connections: Socially Integrated  . Frequency of Communication with Friends and Family: More than three times a week  . Frequency of Social Gatherings with Friends and Family: More than three times a week  . Attends Religious Services: More than 4 times per year  . Active Member of Clubs or Organizations: Yes  . Attends Archivist Meetings: More than 4 times per year  . Marital Status: Married  Human resources officer Violence: Not on file    FAMILY HISTORY: Family History  Problem Relation Age of  Onset  . CAD Mother 63    ALLERGIES:  has No Known Allergies.  MEDICATIONS:  Current Outpatient Medications  Medication Sig Dispense Refill  . amLODipine (NORVASC) 10 MG tablet Take 1 tablet (10 mg total) by mouth daily. 30 tablet 6  . dexamethasone (DECADRON) 4 MG tablet Take 2 tablets (8 mg total) by mouth daily. Take daily x 3 days starting the day after cisplatin chemotherapy. Take with food. 30 tablet 1  . enoxaparin (LOVENOX) 100 MG/ML injection Inject 1 mL (100 mg total) into the skin 2 (two) times daily for 2 days. (Patient not taking: Reported on 09/19/2020) 3 mL 0  . lidocaine-prilocaine (EMLA) cream Apply to affected area once 30 g 3  . LORazepam (ATIVAN) 0.5 MG tablet Take 1 tablet (0.5 mg total) by mouth every 6 (six) hours as needed (Nausea or vomiting). 30 tablet 0  . ondansetron (ZOFRAN) 8 MG tablet Take 1 tablet (8 mg total) by mouth 2 (two) times daily as needed. Start on the third day after cisplatin chemotherapy. 30 tablet 1  . prochlorperazine (COMPAZINE) 10 MG tablet Take 1 tablet (10 mg total) by mouth every 6 (six) hours as needed (Nausea or vomiting). 30 tablet 1  . rivaroxaban (XARELTO) 20 MG TABS tablet Take 1 tablet (20 mg total) by mouth daily with supper. 30 tablet 6   No current facility-administered medications for this visit.     PHYSICAL EXAMINATION: ECOG PERFORMANCE STATUS: 0 - Asymptomatic  There were no vitals filed for this visit. There were no vitals filed for this visit.  Physical Exam Constitutional:      Appearance: Normal appearance.  HENT:     Head: Normocephalic and atraumatic.     Mouth/Throat:     Mouth: Mucous membranes are moist.     Pharynx: No oropharyngeal exudate or posterior oropharyngeal erythema.  Cardiovascular:     Rate and Rhythm: Normal rate and regular rhythm.     Pulses: Normal pulses.     Heart sounds: Normal heart sounds.  Pulmonary:     Effort: Pulmonary effort is normal.     Breath sounds: Normal breath sounds.   Abdominal:     General: Abdomen is flat.     Palpations: Abdomen is soft.  Musculoskeletal:        General: No swelling or tenderness.     Cervical back: No rigidity or tenderness.  Lymphadenopathy:     Cervical: Cervical adenopathy (Large left-sided cervical lymphadenopathy palpated) present.  Neurological:     General: No focal deficit present.  Mental Status: He is alert.  Psychiatric:        Mood and Affect: Mood normal.      LABORATORY DATA:  I have reviewed the data as listed Lab Results  Component Value Date   WBC 5.6 09/11/2020   HGB 14.8 09/11/2020   HCT 44.5 09/11/2020   MCV 81.1 09/11/2020   PLT 227 09/11/2020     Chemistry      Component Value Date/Time   NA 140 09/03/2020 1534   NA 139 12/10/2019 1348   K 4.2 09/03/2020 1534   CL 106 09/03/2020 1534   CO2 25 09/03/2020 1534   BUN 14 09/03/2020 1534   BUN 17 12/10/2019 1348   CREATININE 1.17 09/03/2020 1534      Component Value Date/Time   CALCIUM 9.3 09/03/2020 1534   ALKPHOS 72 09/03/2020 1534   AST 12 (L) 09/03/2020 1534   ALT 12 09/03/2020 1534   BILITOT 0.4 09/03/2020 1534       RADIOGRAPHIC STUDIES: I have personally reviewed the radiological images as listed and agreed with the findings in the report. IR Gastrostomy Tube  Result Date: 09/11/2020 INDICATION: 60 year old with squamous cell carcinoma of the left tonsil. Request for a gastrostomy tube prior to chemotherapy and radiation treatment. EXAM: PERCUTANEOUS GASTROSTOMY TUBE WITH FLUOROSCOPIC GUIDANCE Physician: Stephan Minister. Anselm Pancoast, MD MEDICATIONS: Ancef 2 g; Antibiotics were administered within 1 hour of the procedure. Glucagon 1 mg IV ANESTHESIA/SEDATION: Versed 1.0 mg IV; Fentanyl 100 mcg IV Moderate Sedation Time:  20 minutes The patient was continuously monitored during the procedure by the interventional radiology nurse under my direct supervision. FLUOROSCOPY TIME:  Fluoroscopy Time: 2 minutes, 42 seconds, 69 mGy COMPLICATIONS: None  immediate. PROCEDURE: The procedure was explained to the patient. The risks and benefits of the procedure were discussed and the patient's questions were addressed. Informed consent was obtained from the patient. The patient was placed on the interventional table. Fluoroscopy demonstrated oral contrast in the transverse colon. An nasogastric tube was placed with fluoroscopic guidance. The anterior abdomen was prepped and draped in sterile fashion. Maximal barrier sterile technique was utilized including caps, mask, sterile gowns, sterile gloves, sterile drape, hand hygiene and skin antiseptic. Stomach was inflated with air through the orogastric tube. The skin and subcutaneous tissues were anesthetized with 1% lidocaine. Small incision was made. A needle with a T-fastener was advanced into the stomach using fluoroscopy. T fastener was deployed without complication. Total of 3 T-fasteners were deployed in a triangular configuration for the gastropexy. Small incision was made between the T-fasteners. Needle was directed through this incision into the stomach using fluoroscopy. Wire was advanced into the stomach. The tract was dilated to accommodate a 22 French peel-away sheath. A 20 French Entuit gastrostomy tube was advanced over the wire and through the peel-away sheath. Peel-away sheath was removed. The retention balloon was inflated with 10 mL of saline containing a small amount of contrast. Contrast was injected through the lumen to confirm placement in the stomach. Gastrostomy tube was flushed with saline. FINDINGS: Gastropexy was performed with 3 T-fasteners situated in a triangular configuration. 18 French gastrostomy tube was placed between the T-fasteners. Contrast injection confirmed placement in the stomach. IMPRESSION: 1. Successful placement of a percutaneous gastrostomy tube using fluoroscopy. Patient has a 53 [place] balloon retention gastrostomy tube. 2. Patient will return to Interventional Radiology  in 10-14 days for removal of the gastropexy T-fasteners. Electronically Signed   By: Markus Daft M.D.   On: 09/11/2020 14:57  IR IMAGING GUIDED PORT INSERTION  Result Date: 09/11/2020 INDICATION: 60 year old with squamous cell carcinoma of the left tonsil. Port-A-Cath needed for chemotherapy. EXAM: FLUOROSCOPIC AND ULTRASOUND GUIDED PLACEMENT OF A SUBCUTANEOUS PORT COMPARISON:  None. MEDICATIONS: Moderate sedation ANESTHESIA/SEDATION: Versed 3.0 mg IV; Fentanyl 100 mcg IV; Moderate Sedation Time:  24 minutes The patient was continuously monitored during the procedure by the interventional radiology nurse under my direct supervision. FLUOROSCOPY TIME:  30 seconds, 5 mGy COMPLICATIONS: None immediate. PROCEDURE: The procedure, risks, benefits, and alternatives were explained to the patient. Questions regarding the procedure were encouraged and answered. The patient understands and consents to the procedure. Patient was placed supine on the interventional table. Ultrasound confirmed a patent right internal jugular vein. Ultrasound image was saved for documentation. The right chest and neck were cleaned with a skin antiseptic and a sterile drape was placed. Maximal barrier sterile technique was utilized including caps, mask, sterile gowns, sterile gloves, sterile drape, hand hygiene and skin antiseptic. The right neck was anesthetized with 1% lidocaine. Small incision was made in the right neck with a blade. Micropuncture set was placed in the right internal jugular vein with ultrasound guidance. The micropuncture wire was used for measurement purposes. The right chest was anesthetized with 1% lidocaine with epinephrine. #15 blade was used to make an incision and a subcutaneous port pocket was formed. [place] was assembled. Subcutaneous tunnel was formed with a stiff tunneling device. The port catheter was brought through the subcutaneous tunnel. The port was placed in the subcutaneous pocket. The  micropuncture set was exchanged for a peel-away sheath. The catheter was placed through the peel-away sheath and the tip was positioned at the superior cavoatrial junction. Catheter placement was confirmed with fluoroscopy. The port was accessed and flushed with heparinized saline. The port pocket was closed using two layers of absorbable sutures and Dermabond. The vein skin site was closed using a single layer of absorbable suture and Dermabond. Sterile dressings were applied. Patient tolerated the procedure well without an immediate complication. Ultrasound and fluoroscopic images were taken and saved for this procedure. IMPRESSION: Placement of a subcutaneous port device. Catheter tip at the superior cavoatrial junction. Electronically Signed   By: Markus Daft M.D.   On: 09/11/2020 14:49   I independently reviewed PET imaging from outside facility  09/23/2020   Final Pathologic Diagnosis     "TONSIL", BIOPSY: Carcinoma, favor poorly differentiated squamous cell carcinoma. See comment.    Comment    Immunohistochemical stains are performed to characterize the tumor. The tumor cells are positive for EMA (patchy) and p63 (focal); negative for SMA and ERG. These findings, along with the morphologic features and prior immunohistochemical studies (case [id number]), support the diagnosis of carcinoma. A poorly differentiated, p16 positive squamous cell carcinoma is favored. The control(s) worked appropriately.     All questions were answered. The patient knows to call the clinic with any problems, questions or concerns. I spent over 30 minutes in the care of this patient including H and P, review of records, counseling and coordination of care.     Benay Pike, MD 10/02/2020 8:45 AM

## 2020-10-06 ENCOUNTER — Other Ambulatory Visit: Payer: Self-pay

## 2020-10-06 ENCOUNTER — Ambulatory Visit (INDEPENDENT_AMBULATORY_CARE_PROVIDER_SITE_OTHER): Payer: Medicaid Other | Admitting: Dentistry

## 2020-10-06 VITALS — BP 139/86 | HR 101 | Temp 98.7°F

## 2020-10-06 DIAGNOSIS — K08199 Complete loss of teeth due to other specified cause, unspecified class: Secondary | ICD-10-CM

## 2020-10-06 MED ORDER — CHLORHEXIDINE GLUCONATE 0.12 % MT SOLN: OROMUCOSAL | 0 refills | Status: AC

## 2020-10-06 NOTE — Progress Notes (Addendum)
Department of Dental Medicine     POSTOPERATIVE VISIT   Service Date:   10/06/2020  Patient Name:  Mitchell Cooper Date of Birth:   07-24-1960 Medical Record Number: [id number]   PLAN & RECOMMENDATIONS   > The patient is healing well and consistent with dental procedures performed. >> The patient is cleared to start radiation at this time. >>> Plan to follow-up with patient following the completion of radiation therapy and as needed.  >>  Recommend the patient establish care at a dental office of their choice for routine dental care including replacement of missing teeth, cleanings/periodontal therapy and periodic exams. >>  Discussed in detail all treatment options with the patient and they are agreeable to the plan.   10/06/2020     POSTOPERATIVE NOTE   COVID 19 SCREENING: The patient denies symptoms concerning for COVID-19 infection including fever, chills, cough, or newly developed shortness of breath.   HISTORY OF PRESENT ILLNESS: > Mitchell Cooper presents today for a postoperative visit s/p extractions of teeth numbers 2, 14, 15, 16, 17, 19, 30 and 31 in the operating room on 3/10.   >> Medical and dental history reviewed with the patient.  No changes reported.   CHIEF COMPLAINT:  Patient with no complaints.   Patient Active Problem List   Diagnosis Date Noted  . Dental caries   . Chronic periodontitis   . Carcinoma of tonsillar fossa ([place]) 09/12/2020  . Squamous cell carcinoma of left tonsil ([place]) 09/03/2020  . Tonsillar mass 08/08/2020  . Mass of left side of neck 07/09/2020  . Former smoker 04/08/2020  . Primary osteoarthritis of both knees 04/08/2020  . VTE (venous thromboembolism) 03/19/2020  . RVF (right ventricular failure) ([place]) 12/07/2019  . Recurrent pulmonary emboli ([place]) 11/03/2019  . 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 History of pulmonary embolus (PE) 06/14/2016  . Essential hypertension 06/14/2016  . CKD (chronic kidney disease) stage  3, GFR 30-59 ml/min ([facility]) 06/14/2016  . Hyperglycemia 06/14/2016   Past Medical History:  Diagnosis Date  . Cancer ([place])    Left Tonsillar Cancer   . DVT (deep venous thrombosis) ([place])   . Hypertension 1997   has never taken medications  . Tobacco dependence    Past Surgical History:  Procedure Laterality Date  . IR GASTROSTOMY TUBE MOD SED  09/11/2020  . IR IMAGING GUIDED PORT INSERTION  09/11/2020  . KNEE SURGERY  approx. 1984   reconstructive surgery of left knee; arthroscopic procedure about 1997  . MULTIPLE EXTRACTIONS WITH ALVEOLOPLASTY N/A 09/25/2020   Procedure: MULTIPLE EXTRACTIONS WITH ALVEOLOPLASTY;  Surgeon: Charlaine Dalton, DMD;  Location: [facility];  Service: Dentistry;  Laterality: N/A;  . PERIPHERAL VASCULAR CATHETERIZATION Left 06/16/2016   Procedure: Lower Extremity Venography- Venus Thrombolysis;  Surgeon: Serafina Mitchell, MD;  Location: [facility];  Service: Cardiovascular;  Laterality: Left;   Current Outpatient Medications  Medication Sig Dispense Refill  . amLODipine (NORVASC) 10 MG tablet Take 1 tablet (10 mg total) by mouth daily. 30 tablet 6  . dexamethasone (DECADRON) 4 MG tablet Take 2 tablets (8 mg total) by mouth daily. Take daily x 3 days starting the day after cisplatin chemotherapy. Take with food. (Patient not taking: Reported on 10/02/2020) 30 tablet 1  . enoxaparin (LOVENOX) 100 MG/ML injection Inject 1 mL (100 mg total) into the skin 2 (two) times daily for 2 days. 3 mL 0  . lidocaine-prilocaine (EMLA) cream Apply to affected area once 30 g 3  .  ondansetron (ZOFRAN) 8 MG tablet Take 1 tablet (8 mg total) by mouth 2 (two) times daily as needed. Start on the third day after cisplatin chemotherapy. 30 tablet 1  . prochlorperazine (COMPAZINE) 10 MG tablet Take 1 tablet (10 mg total) by mouth every 6 (six) hours as needed (Nausea or vomiting). 30 tablet 1  . rivaroxaban (XARELTO) 20 MG TABS tablet Take 1 tablet (20 mg total) by mouth daily with supper.  30 tablet 6   No current facility-administered medications for this visit.   No Known Allergies  LABS: Lab Results  Component Value Date   WBC 5.6 09/11/2020   HGB 14.8 09/11/2020   HCT 44.5 09/11/2020   MCV 81.1 09/11/2020   PLT 227 09/11/2020   BMET    Component Value Date/Time   NA 140 09/03/2020 1534   NA 139 12/10/2019 1348   K 4.2 09/03/2020 1534   CL 106 09/03/2020 1534   CO2 25 09/03/2020 1534   GLUCOSE 97 09/03/2020 1534   BUN 14 09/03/2020 1534   BUN 17 12/10/2019 1348   CREATININE 1.17 09/03/2020 1534   CALCIUM 9.3 09/03/2020 1534   GFRNONAA >60 09/03/2020 1534   GFRAA 89 12/10/2019 1348    Lab Results  Component Value Date   INR 1.1 09/11/2020   INR 1.3 (H) 11/05/2019   INR 2.0 (H) 11/04/2019   No results found for: PTT   VITALS: BP 139/86 (BP Location: Right Arm)   Pulse (!) 101   Temp 98.7 F (37.1 C) (Oral)    EXAM: >> Extraction sites appear to be healing WNL.  No signs of wound dehiscence or infection evident upon examination.  No sutures remain in-tact.   ASSESSMENT:  Postoperative course is consistent with dental procedures performed.  1. Loss of teeth due to Extraction   PROCEDURES: 1. The patient was given a chlorhexidine gluconate rinse for 30 seconds.   PLAN: > Patient is to return to our clinic for another follow-up visit after completion of radiation therapy or follow-up as needed until that time. >> Patient to return to find an outside dental provider of their choice for routine dental care including replacement of missing teeth, cleanings/periodontal therapy and exams.  Recommend that they discuss plans to return to the dentist for non-urgent treatment with their medical team to ensure they are medically optimized and there are no contraindications. >>> Patient instructed to call should any questions or concerns arise.  > Rx for Chlorhexidine gluconate written today and sent to patient's pharmacy to use until he starts  radiation.  Patient instructed to stop mouth rinse prior to beginning radiotherapy.  <> The patient verbalized understanding of discussion and plan.  All questions and concerns were addressed.   <> The patient tolerated today's visit well and departed in stable condition.   Raymer Benson [place], DMD

## 2020-10-07 DIAGNOSIS — Z510 Encounter for antineoplastic radiation therapy: Secondary | ICD-10-CM | POA: Diagnosis not present

## 2020-10-07 DIAGNOSIS — C090 Malignant neoplasm of tonsillar fossa: Secondary | ICD-10-CM | POA: Diagnosis not present

## 2020-10-08 ENCOUNTER — Ambulatory Visit: Payer: Medicaid Other | Admitting: Hematology and Oncology

## 2020-10-08 ENCOUNTER — Other Ambulatory Visit: Payer: Self-pay

## 2020-10-08 ENCOUNTER — Ambulatory Visit: Payer: Medicaid Other

## 2020-10-08 ENCOUNTER — Ambulatory Visit
Admission: RE | Admit: 2020-10-08 | Discharge: 2020-10-08 | Disposition: A | Discharge status: Home / Self Care | Payer: Medicaid Other | Source: Ambulatory Visit | Attending: Radiation Oncology | Admitting: Radiation Oncology

## 2020-10-08 ENCOUNTER — Other Ambulatory Visit: Payer: Medicaid Other

## 2020-10-08 DIAGNOSIS — C090 Malignant neoplasm of tonsillar fossa: Secondary | ICD-10-CM | POA: Diagnosis not present

## 2020-10-08 DIAGNOSIS — Z510 Encounter for antineoplastic radiation therapy: Secondary | ICD-10-CM | POA: Diagnosis not present

## 2020-10-08 NOTE — Progress Notes (Signed)
Oncology Nurse Navigator Documentation  To provide support, encouragement and care continuity, met with Mitchell Cooper for his initial RT.    I reviewed the 2-step treatment process, answered questions.   Mitchell Cooper completed treatment without difficulty, denied questions/concerns.  I reviewed the registration/arrival procedure for subsequent treatments.  We discussed his missed appointments with [name] SLP & [name] PT on Thursday 10/02/20. I explained that [name] and [name] are here about every other Thursday and that will coincide with his chemotherapy days starting tomorrow, so he will need to be scheduled at the providers outpatient clinics. He was agreeable to that plan and will be expecting a call from their offices with an appointment time.   I encouraged him to call me with questions/concerns as tmts proceed.   Harlow Asa RN, BSN, OCN Head & Neck Oncology Nurse [place] at [facility] Phone # [phone]  Fax # [phone]

## 2020-10-09 ENCOUNTER — Ambulatory Visit
Admission: RE | Admit: 2020-10-09 | Discharge: 2020-10-09 | Disposition: A | Discharge status: Home / Self Care | Payer: Medicaid Other | Source: Ambulatory Visit | Attending: Radiation Oncology | Admitting: Radiation Oncology

## 2020-10-09 ENCOUNTER — Inpatient Hospital Stay (HOSPITAL_BASED_OUTPATIENT_CLINIC_OR_DEPARTMENT_OTHER): Payer: Medicaid Other | Admitting: Hematology and Oncology

## 2020-10-09 ENCOUNTER — Inpatient Hospital Stay: Payer: Medicaid Other

## 2020-10-09 ENCOUNTER — Encounter: Payer: Self-pay | Admitting: Hematology and Oncology

## 2020-10-09 ENCOUNTER — Inpatient Hospital Stay: Payer: Medicaid Other | Admitting: Nutrition

## 2020-10-09 DIAGNOSIS — Z86711 Personal history of pulmonary embolism: Secondary | ICD-10-CM | POA: Diagnosis not present

## 2020-10-09 DIAGNOSIS — R5382 Chronic fatigue, unspecified: Secondary | ICD-10-CM

## 2020-10-09 DIAGNOSIS — C099 Malignant neoplasm of tonsil, unspecified: Secondary | ICD-10-CM

## 2020-10-09 DIAGNOSIS — Z5111 Encounter for antineoplastic chemotherapy: Secondary | ICD-10-CM | POA: Diagnosis not present

## 2020-10-09 DIAGNOSIS — R5381 Other malaise: Secondary | ICD-10-CM

## 2020-10-09 DIAGNOSIS — Z510 Encounter for antineoplastic radiation therapy: Secondary | ICD-10-CM | POA: Diagnosis not present

## 2020-10-09 DIAGNOSIS — C090 Malignant neoplasm of tonsillar fossa: Secondary | ICD-10-CM | POA: Diagnosis not present

## 2020-10-09 LAB — CBC WITH DIFFERENTIAL/PLATELET
Abs Immature Granulocytes: 0.01 10*3/uL (ref 0.00–0.07)
Basophils Absolute: 0.1 10*3/uL (ref 0.0–0.1)
Basophils Relative: 1 %
Eosinophils Absolute: 0.3 10*3/uL (ref 0.0–0.5)
Eosinophils Relative: 4 %
HCT: 41.8 % (ref 39.0–52.0)
Hemoglobin: 13.9 g/dL (ref 13.0–17.0)
Immature Granulocytes: 0 %
Lymphocytes Relative: 17 %
Lymphs Abs: 1.3 10*3/uL (ref 0.7–4.0)
MCH: 26.4 pg (ref 26.0–34.0)
MCHC: 33.3 g/dL (ref 30.0–36.0)
MCV: 79.3 fL — ABNORMAL LOW (ref 80.0–100.0)
Monocytes Absolute: 0.9 10*3/uL (ref 0.1–1.0)
Monocytes Relative: 12 %
Neutro Abs: 5.2 10*3/uL (ref 1.7–7.7)
Neutrophils Relative %: 66 %
Platelets: 245 10*3/uL (ref 150–400)
RBC: 5.27 MIL/uL (ref 4.22–5.81)
RDW: 14.5 % (ref 11.5–15.5)
WBC: 7.7 10*3/uL (ref 4.0–10.5)
nRBC: 0 % (ref 0.0–0.2)

## 2020-10-09 LAB — BASIC METABOLIC PANEL
Anion gap: 10 (ref 5–15)
BUN: 12 mg/dL (ref 6–20)
CO2: 26 mmol/L (ref 22–32)
Calcium: 9.1 mg/dL (ref 8.9–10.3)
Chloride: 104 mmol/L (ref 98–111)
Creatinine, Ser: 1.18 mg/dL (ref 0.61–1.24)
GFR, Estimated: >60 mL/min (ref >60)
Glucose, Bld: 98 mg/dL (ref 70–99)
Potassium: 4.4 mmol/L (ref 3.5–5.1)
Sodium: 140 mmol/L (ref 135–145)

## 2020-10-09 LAB — MAGNESIUM: Magnesium: 2.1 mg/dL (ref 1.7–2.4)

## 2020-10-09 LAB — TSH: TSH: 0.823 u[IU]/mL (ref 0.320–4.118)

## 2020-10-09 MED ORDER — POTASSIUM CHLORIDE IN NACL 20-0.9 MEQ/L-% IV SOLN
Freq: Once | INTRAVENOUS | Status: AC
  Filled 2020-10-09: qty 1000

## 2020-10-09 MED ORDER — MAGNESIUM SULFATE 2 GM/50ML IV SOLN
2.0000 g | Freq: Once | INTRAVENOUS | Status: AC
  Administered 2020-10-09: 2 g via INTRAVENOUS

## 2020-10-09 MED ORDER — SODIUM CHLORIDE 0.9 % IV SOLN
150.0000 mg | Freq: Once | INTRAVENOUS | Status: AC
  Administered 2020-10-09: 150 mg via INTRAVENOUS
  Filled 2020-10-09: qty 150

## 2020-10-09 MED ORDER — MAGNESIUM SULFATE 2 GM/50ML IV SOLN
INTRAVENOUS | Status: AC
  Filled 2020-10-09: qty 50

## 2020-10-09 MED ORDER — PALONOSETRON HCL INJECTION 0.25 MG/5ML
INTRAVENOUS | Status: AC
  Filled 2020-10-09: qty 5

## 2020-10-09 MED ORDER — SODIUM CHLORIDE 0.9 % IV SOLN
Freq: Once | INTRAVENOUS | Status: AC
  Filled 2020-10-09: qty 250

## 2020-10-09 MED ORDER — HEPARIN SOD (PORK) LOCK FLUSH 100 UNIT/ML IV SOLN
500.0000 [IU] | Freq: Once | INTRAVENOUS | Status: AC | PRN
  Administered 2020-10-09: 500 [IU]
  Filled 2020-10-09: qty 5

## 2020-10-09 MED ORDER — PALONOSETRON HCL INJECTION 0.25 MG/5ML
0.2500 mg | Freq: Once | INTRAVENOUS | Status: AC
  Administered 2020-10-09: 0.25 mg via INTRAVENOUS

## 2020-10-09 MED ORDER — SODIUM CHLORIDE 0.9 % IV SOLN
40.0000 mg/m2 | Freq: Once | INTRAVENOUS | Status: AC
  Administered 2020-10-09: 88 mg via INTRAVENOUS
  Filled 2020-10-09: qty 88

## 2020-10-09 MED ORDER — SODIUM CHLORIDE 0.9 % IV SOLN
10.0000 mg | Freq: Once | INTRAVENOUS | Status: AC
  Administered 2020-10-09: 10 mg via INTRAVENOUS
  Filled 2020-10-09: qty 10

## 2020-10-09 MED ORDER — SODIUM CHLORIDE 0.9 % IV SOLN: Freq: Once | INTRAVENOUS | Status: DC

## 2020-10-09 MED ORDER — SODIUM CHLORIDE 0.9% FLUSH
10.0000 mL | INTRAVENOUS | Status: DC | PRN
  Administered 2020-10-09: 10 mL
  Filled 2020-10-09: qty 10

## 2020-10-09 NOTE — Progress Notes (Signed)
60 year old male diagnosed with left tonsil cancer, p16 positive.  Patient is followed by Dr. Isidore Moos and Dr.Iruku. Status post G-tube placement on February 24. Patient to receive weekly cisplatin and daily radiation therapy.  Past medical history includes PE, tobacco, hypertension, chronic kidney disease stage III, hyperglycemia, occasional alcohol.  Medications include Decadron, Ativan, Zofran, Compazine.  Labs were reviewed.  Height: 5 feet 9 inches. Weight: 211.4 pounds March 24. Usual body weight: 220 pounds. BMI: 31.88. ECOG: 0.  Estimated nutrition needs: 2400-2600 cal, 120-140 g protein, 2.5 L fluid.  Patient status post dental extractions.  He reports he is starting to eat softer foods in addition to liquids. He drinks 1-2 cartons of boost daily.  He prefers chocolate. He currently denies nutrition impact symptoms. He has had a 4% weight loss from his usual body weight of 220 which is occurred over 1 month. Patient has questions related to flushing his feeding tube and using his Eugenio Hoes valve.  Nutrition diagnosis:  Food and nutrition related knowledge deficit related to tonsil cancer and associated treatments as evidenced by no prior need for nutrition related information.  Intervention: Educated patient to continue strategies for increasing calories and protein in small frequent meals and snacks. Continue oral nutrition supplements, 1-2 cartons daily. Continue feeding tube care daily.  Assisted patient with demonstrating water flush and using the [employer] valve.  Patient was able to successfully demonstrate proper tube feeding flushes.  Will start tube feeding once oral intake decreases. Provided fact sheets.  Questions were answered.  Teach back method used.  Contact information has been given.  Monitoring, evaluation, goals: Patient will tolerate adequate calories and protein to minimize weight loss.  Next visit: Thursday, March 31 during infusion.  **Disclaimer: This  note was dictated with voice recognition software. Similar sounding words can inadvertently be transcribed and this note may contain transcription errors which may not have been corrected upon publication of note.**

## 2020-10-09 NOTE — Progress Notes (Signed)
[place] NOTE  Patient Care Team: Ladell Pier, MD as PCP - General (Internal Medicine)  CHIEF COMPLAINTS/PURPOSE OF CONSULTATION:  New diagnosis of left tonsillar cancer.  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 Given the above information and very locally advanced disease, we decided to proceed with concurrent chemoradiation with weekly cisplatin. Discussed adverse effects with cisplatin in previous visit No baseline hearing issues. He understands that some of the side effects with chemo can be permanent. Today is C1D1 of chemotherapy. CBC satisfactory. If BMP and Mag are within parameters, we will proceed with chemotherapy today.  History of pulmonary embolus (PE) He had 2 episodes of PE, hence will continue indefinite anticoagulation at this time. No indication for hypercoagulable work up. He understands the risk of bleeding with blood thinners on board.  No orders of the defined types were placed in this encounter.  HISTORY OF PRESENTING ILLNESS:  Mitchell Cooper 60 y.o. male is here because of new diagnosis of tonsil cancer  Mitchell Cooper is a 60 y.o. male who presents as a consultation at the request of Dr.Bates with new diagnosis of poorly differentiated carcinoma noted on tonsilar biopsy. He initially presented with a chief complaint of neck mass going on for more than one year, care delayed partly by the pandemic. His neck mass continued to progress in size. He otherwise denied any dysphagia, speech issues, otalgia or weight loss He had two episodes of PE in 2018 and 2021 and is on indefinite anticoagulation with xarelto. According to patient there was no clear etiology for these PE  Except for long periods of immobilization for work. He otherwise has HTN, well controlled on norvasc He is not a heavy smoker,  smoked a pack in a week or so, quit smoking in 2018, resumed in 2021. He was a Architect worked by occupation.  Imaging and work up done so far.  07/23/2020  IMPRESSION: 1. 2.4 x 4.7 cm mass in the left oropharynx which appears to be arising from the soft palate extending inferiorly. Findings compatible with carcinoma. 2. Large necrotic lymph node mass in the left neck compatible with metastatic disease. Additional small ill-defined lymph nodes the left could be due to tumor as well. 3. Poor dentition.  08/27/2020  Final Pathologic Diagnosis    A.  LEFT TONSIL, BIOPSY:              Poorly differentiated malignant neoplasm.              See comment.   Electronically signed by Lowella Bandy, MD on 08/13/2020 at 12:10 PM  Comment    This biopsy show the presence of a poorly differentiated malignant neoplasm located at the subepithelial stroma.  Immunostains with appropriate controls show that tumor cells are positive for vimentin and p16, and negative for pancytokeratin (AE1/3), low molecular weight cytokeratin, CK5/6, pan melanoma markers, SOX10, S100, desmin, CD34, CD30  and p40.  CD45, CD3 and CD20 are positive in some background lymphocytes and the tumor cells appear to be negative.  Ki67 is positive in about 40% of the tumor cells.  The findings are consistent with a poorly differentiated malignant neoplasm of undetermined classification.  This tumor shows negative reaction for multiple keratin markers, but a very poorly differentiated carcinoma still can not be entirely ruled.  Because of the negative reaction for multiple markers of melanoma,  a malignant melanoma is less likely.  Dr. Sherran Needs also reviewed this case, and believes that the findings in this biopsy are not diagnostic of lymphoma.  A malignant mesenchymal neoplasm is possible, but other types of malignant neoplasm can not be ruled out.  Since this is a relatively small biopsy, additional sampling of the lesion may probably  provide additional findings to help further classification of this tumor.  Clinical correlation is recommended.    Dr. Tommas Olp reviewed this case and agrees with this diagnosis.    08/27/2020  IMPRESSION: 1. Left tonsillar mass is hypermetabolic and consistent with neoplasm. 2. Bulky necrotic left metastatic nodal disease. 3. No findings for metastatic disease involving the chest, abdomen/pelvis or bony structures.  Interval history  He is here for consideration of chemotherapy. He is doing well, absolutely denies any pain. He is able to swallow food He now has G tube for nourishment. He denies any changes in breathing, bowel habits or urinary habits No new neurological complaints. Rest of the pertinent 10 point ROS reviewed and negative.  MEDICAL HISTORY:  Past Medical History:  Diagnosis Date  . Cancer ([place])    Left Tonsillar Cancer   . DVT (deep venous thrombosis) ([place])   . Hypertension 1997   has never taken medications  . Tobacco dependence     SURGICAL HISTORY: Past Surgical History:  Procedure Laterality Date  . IR GASTROSTOMY TUBE MOD SED  09/11/2020  . IR IMAGING GUIDED PORT INSERTION  09/11/2020  . KNEE SURGERY  approx. 1984   reconstructive surgery of left knee; arthroscopic procedure about 1997  . MULTIPLE EXTRACTIONS WITH ALVEOLOPLASTY N/A 09/25/2020   Procedure: MULTIPLE EXTRACTIONS WITH ALVEOLOPLASTY;  Surgeon: Charlaine Dalton, DMD;  Location: [place];  Service: Dentistry;  Laterality: N/A;  . PERIPHERAL VASCULAR CATHETERIZATION Left 06/16/2016   Procedure: Lower Extremity Venography- Venus Thrombolysis;  Surgeon: Serafina Mitchell, MD;  Location: [place] [facility];  Service: Cardiovascular;  Laterality: Left;    SOCIAL HISTORY: Social History   Socioeconomic History  . Marital status: Married    Spouse name: Not on file  . Number of children: Not on file  . Years of education: Not on file  . Highest education level: Not on file  Occupational History   . Occupation: Architect  Tobacco Use  . Smoking status: Current Some Day Smoker    Types: Cigarettes    Last attempt to quit: 11/21/2016    Years since quitting: 3.8  . Smokeless tobacco: Never Used  Vaping Use  . Vaping Use: Never used  Substance and Sexual Activity  . Alcohol use: Yes    Alcohol/week: 6.0 standard drinks    Types: 6 Cans of beer per week  . Drug use: No  . Sexual activity: Not Currently  Other Topics Concern  . Not on file  Social History Narrative  . Not on file   Social Determinants of Health   Financial Resource Strain: Low Risk   . Difficulty of Paying Living Expenses: Not very hard  Food Insecurity: No Food Insecurity  . Worried About Charity fundraiser in the Last Year: Never true  . Ran Out of Food in the Last Year: Never true  Transportation Needs: No Transportation Needs  . Lack of Transportation (Medical): No  . Lack of Transportation (Non-Medical): No  Physical Activity: Inactive  . Days of Exercise per Week: 0 days  . Minutes of Exercise per Session: 0 min  Stress: No Stress Concern Present  .  Feeling of Stress : Only a little  Social Connections: Socially Integrated  . Frequency of Communication with Friends and Family: More than three times a week  . Frequency of Social Gatherings with Friends and Family: More than three times a week  . Attends Religious Services: More than 4 times per year  . Active Member of Clubs or Organizations: Yes  . Attends Archivist Meetings: More than 4 times per year  . Marital Status: Married  Human resources officer Violence: Not on file    FAMILY HISTORY: Family History  Problem Relation Age of Onset  . CAD Mother 26    ALLERGIES:  has No Known Allergies.  MEDICATIONS:  Current Outpatient Medications  Medication Sig Dispense Refill  . amLODipine (NORVASC) 10 MG tablet Take 1 tablet (10 mg total) by mouth daily. 30 tablet 6  . dexamethasone (DECADRON) 4 MG tablet Take 2 tablets (8 mg total)  by mouth daily. Take daily x 3 days starting the day after cisplatin chemotherapy. Take with food. (Patient not taking: Reported on 10/02/2020) 30 tablet 1  . enoxaparin (LOVENOX) 100 MG/ML injection Inject 1 mL (100 mg total) into the skin 2 (two) times daily for 2 days. 3 mL 0  . lidocaine-prilocaine (EMLA) cream Apply to affected area once 30 g 3  . ondansetron (ZOFRAN) 8 MG tablet Take 1 tablet (8 mg total) by mouth 2 (two) times daily as needed. Start on the third day after cisplatin chemotherapy. 30 tablet 1  . prochlorperazine (COMPAZINE) 10 MG tablet Take 1 tablet (10 mg total) by mouth every 6 (six) hours as needed (Nausea or vomiting). 30 tablet 1  . rivaroxaban (XARELTO) 20 MG TABS tablet Take 1 tablet (20 mg total) by mouth daily with supper. 30 tablet 6   No current facility-administered medications for this visit.     PHYSICAL EXAMINATION: ECOG PERFORMANCE STATUS: 0 - Asymptomatic  Vitals:   10/09/20 0823  BP: 128/81  Pulse: 91  Resp: 18  Temp: 97.6 F (36.4 C)  SpO2: 100%   Filed Weights   10/09/20 0823  Weight: 211 lb 6.4 oz (95.9 kg)    Physical Exam Constitutional:      Appearance: Normal appearance.  HENT:     Head: Normocephalic and atraumatic.     Mouth/Throat:     Mouth: Mucous membranes are moist.     Pharynx: No oropharyngeal exudate or posterior oropharyngeal erythema.  Cardiovascular:     Rate and Rhythm: Normal rate and regular rhythm.     Pulses: Normal pulses.     Heart sounds: Normal heart sounds.  Pulmonary:     Effort: Pulmonary effort is normal.     Breath sounds: Normal breath sounds.  Abdominal:     General: Abdomen is flat.     Palpations: Abdomen is soft.  Musculoskeletal:        General: No swelling or tenderness.     Cervical back: No rigidity or tenderness.  Lymphadenopathy:     Cervical: Cervical adenopathy (Large left-sided cervical lymphadenopathy palpated) present.  Neurological:     General: No focal deficit present.      Mental Status: He is alert.  Psychiatric:        Mood and Affect: Mood normal.      LABORATORY DATA:  I have reviewed the data as listed Lab Results  Component Value Date   WBC 7.7 10/09/2020   HGB 13.9 10/09/2020   HCT 41.8 10/09/2020   MCV 79.3 (L) 10/09/2020  PLT 245 10/09/2020     Chemistry      Component Value Date/Time   NA 140 10/09/2020 0801   NA 139 12/10/2019 1348   K 4.4 10/09/2020 0801   CL 104 10/09/2020 0801   CO2 26 10/09/2020 0801   BUN 12 10/09/2020 0801   BUN 17 12/10/2019 1348   CREATININE 1.18 10/09/2020 0801   CREATININE 1.17 09/03/2020 1534      Component Value Date/Time   CALCIUM 9.1 10/09/2020 0801   ALKPHOS 72 09/03/2020 1534   AST 12 (L) 09/03/2020 1534   ALT 12 09/03/2020 1534   BILITOT 0.4 09/03/2020 1534       RADIOGRAPHIC STUDIES: I have personally reviewed the radiological images as listed and agreed with the findings in the report. IR Gastrostomy Tube  Result Date: 09/11/2020 INDICATION: 60 year old with squamous cell carcinoma of the left tonsil. Request for a gastrostomy tube prior to chemotherapy and radiation treatment. EXAM: PERCUTANEOUS GASTROSTOMY TUBE WITH FLUOROSCOPIC GUIDANCE Physician: Stephan Minister. Anselm Pancoast, MD MEDICATIONS: Ancef 2 g; Antibiotics were administered within 1 hour of the procedure. Glucagon 1 mg IV ANESTHESIA/SEDATION: Versed 1.0 mg IV; Fentanyl 100 mcg IV Moderate Sedation Time:  20 minutes The patient was continuously monitored during the procedure by the interventional radiology nurse under my direct supervision. FLUOROSCOPY TIME:  Fluoroscopy Time: 2 minutes, 42 seconds, 69 mGy COMPLICATIONS: None immediate. PROCEDURE: The procedure was explained to the patient. The risks and benefits of the procedure were discussed and the patient's questions were addressed. Informed consent was obtained from the patient. The patient was placed on the interventional table. Fluoroscopy demonstrated oral contrast in the transverse colon.  An nasogastric tube was placed with fluoroscopic guidance. The anterior abdomen was prepped and draped in sterile fashion. Maximal barrier sterile technique was utilized including caps, mask, sterile gowns, sterile gloves, sterile drape, hand hygiene and skin antiseptic. Stomach was inflated with air through the orogastric tube. The skin and subcutaneous tissues were anesthetized with 1% lidocaine. Small incision was made. A needle with a T-fastener was advanced into the stomach using fluoroscopy. T fastener was deployed without complication. Total of 3 T-fasteners were deployed in a triangular configuration for the gastropexy. Small incision was made between the T-fasteners. Needle was directed through this incision into the stomach using fluoroscopy. Wire was advanced into the stomach. The tract was dilated to accommodate a 22 French peel-away sheath. A 20 French Entuit gastrostomy tube was advanced over the wire and through the peel-away sheath. Peel-away sheath was removed. The retention balloon was inflated with 10 mL of saline containing a small amount of contrast. Contrast was injected through the lumen to confirm placement in the stomach. Gastrostomy tube was flushed with saline. FINDINGS: Gastropexy was performed with 3 T-fasteners situated in a triangular configuration. 40 French gastrostomy tube was placed between the T-fasteners. Contrast injection confirmed placement in the stomach. IMPRESSION: 1. Successful placement of a percutaneous gastrostomy tube using fluoroscopy. Patient has a 29 [place] balloon retention gastrostomy tube. 2. Patient will return to Interventional Radiology in 10-14 days for removal of the gastropexy T-fasteners. Electronically Signed   By: Markus Daft M.D.   On: 09/11/2020 14:57   IR IMAGING GUIDED PORT INSERTION  Result Date: 09/11/2020 INDICATION: 60 year old with squamous cell carcinoma of the left tonsil. Port-A-Cath needed for chemotherapy. EXAM: FLUOROSCOPIC AND  ULTRASOUND GUIDED PLACEMENT OF A SUBCUTANEOUS PORT COMPARISON:  None. MEDICATIONS: Moderate sedation ANESTHESIA/SEDATION: Versed 3.0 mg IV; Fentanyl 100 mcg IV; Moderate Sedation Time:  24 minutes  The patient was continuously monitored during the procedure by the interventional radiology nurse under my direct supervision. FLUOROSCOPY TIME:  30 seconds, 5 mGy COMPLICATIONS: None immediate. PROCEDURE: The procedure, risks, benefits, and alternatives were explained to the patient. Questions regarding the procedure were encouraged and answered. The patient understands and consents to the procedure. Patient was placed supine on the interventional table. Ultrasound confirmed a patent right internal jugular vein. Ultrasound image was saved for documentation. The right chest and neck were cleaned with a skin antiseptic and a sterile drape was placed. Maximal barrier sterile technique was utilized including caps, mask, sterile gowns, sterile gloves, sterile drape, hand hygiene and skin antiseptic. The right neck was anesthetized with 1% lidocaine. Small incision was made in the right neck with a blade. Micropuncture set was placed in the right internal jugular vein with ultrasound guidance. The micropuncture wire was used for measurement purposes. The right chest was anesthetized with 1% lidocaine with epinephrine. #15 blade was used to make an incision and a subcutaneous port pocket was formed. [place] was assembled. Subcutaneous tunnel was formed with a stiff tunneling device. The port catheter was brought through the subcutaneous tunnel. The port was placed in the subcutaneous pocket. The micropuncture set was exchanged for a peel-away sheath. The catheter was placed through the peel-away sheath and the tip was positioned at the superior cavoatrial junction. Catheter placement was confirmed with fluoroscopy. The port was accessed and flushed with heparinized saline. The port pocket was closed using two layers  of absorbable sutures and Dermabond. The vein skin site was closed using a single layer of absorbable suture and Dermabond. Sterile dressings were applied. Patient tolerated the procedure well without an immediate complication. Ultrasound and fluoroscopic images were taken and saved for this procedure. IMPRESSION: Placement of a subcutaneous port device. Catheter tip at the superior cavoatrial junction. Electronically Signed   By: Markus Daft M.D.   On: 09/11/2020 14:49   I independently reviewed PET imaging from outside facility  09/23/2020   Final Pathologic Diagnosis     "TONSIL", BIOPSY: Carcinoma, favor poorly differentiated squamous cell carcinoma. See comment.    Comment    Immunohistochemical stains are performed to characterize the tumor. The tumor cells are positive for EMA (patchy) and p63 (focal); negative for SMA and ERG. These findings, along with the morphologic features and prior immunohistochemical studies (case [id number]), support the diagnosis of carcinoma. A poorly differentiated, p16 positive squamous cell carcinoma is favored. The control(s) worked appropriately.   CBC from today appears satisfactory.  All questions were answered. The patient knows to call the clinic with any problems, questions or concerns. I spent over 30 minutes in the care of this patient including H and P, review of records, counseling and coordination of care.     Benay Pike, MD 10/09/2020 8:40 AM

## 2020-10-09 NOTE — Assessment & Plan Note (Signed)
He had 2 episodes of PE, hence will continue indefinite anticoagulation at this time. No indication for hypercoagulable work up. He understands the risk of bleeding with blood thinners on board.

## 2020-10-09 NOTE — Assessment & Plan Note (Signed)
SCC left tonsil, T3 N3 M0/ Stage III, P16 positive Repeat biopsy suggested poorly differentiated SCC. Given the above information and very locally advanced disease, we decided to proceed with concurrent chemoradiation with weekly cisplatin. Discussed adverse effects with cisplatin in previous visit No baseline hearing issues. He understands that some of the side effects with chemo can be permanent. Today is C1D1 of chemotherapy. CBC satisfactory. If BMP and Mag are within parameters, we will proceed with chemotherapy today.

## 2020-10-09 NOTE — Patient Instructions (Addendum)
[facility] Discharge Instructions for Patients Receiving Chemotherapy  Today you received the following chemotherapy agents: Cisplatin  To help prevent nausea and vomiting after your treatment, we encourage you to take your nausea medication as directed.   If you develop nausea and vomiting that is not controlled by your nausea medication, call the clinic.   BELOW ARE SYMPTOMS THAT SHOULD BE REPORTED IMMEDIATELY:  *FEVER GREATER THAN 100.5 F  *CHILLS WITH OR WITHOUT FEVER  NAUSEA AND VOMITING THAT IS NOT CONTROLLED WITH YOUR NAUSEA MEDICATION  *UNUSUAL SHORTNESS OF BREATH  *UNUSUAL BRUISING OR BLEEDING  TENDERNESS IN MOUTH AND THROAT WITH OR WITHOUT PRESENCE OF ULCERS  *URINARY PROBLEMS  *BOWEL PROBLEMS  UNUSUAL RASH Items with * indicate a potential emergency and should be followed up as soon as possible.  Feel free to call the clinic should you have any questions or concerns. The clinic phone number is [phone].  Please show the [facility] at check-in to the Emergency Department and triage nurse.  Cisplatin injection What is this medicine? CISPLATIN (SIS pla tin) is a chemotherapy drug. It targets fast dividing cells, like cancer cells, and causes these cells to die. This medicine is used to treat many types of cancer like bladder, ovarian, and testicular cancers. This medicine may be used for other purposes; ask your health care provider or pharmacist if you have questions. COMMON BRAND NAME(S): Platinol, Platinol -AQ What should I tell my health care provider before I take this medicine? They need to know if you have any of these conditions:  eye disease, vision problems  hearing problems  kidney disease  low blood counts, like white cells, platelets, or red blood cells  tingling of the fingers or toes, or other nerve disorder  an unusual or allergic reaction to cisplatin, carboplatin, oxaliplatin, other medicines, foods, dyes, or  preservatives  pregnant or trying to get pregnant  breast-feeding How should I use this medicine? This drug is given as an infusion into a vein. It is administered in a hospital or clinic by a specially trained health care professional. Talk to your pediatrician regarding the use of this medicine in children. Special care may be needed. Overdosage: If you think you have taken too much of this medicine contact a poison control center or emergency room at once. NOTE: This medicine is only for you. Do not share this medicine with others. What if I miss a dose? It is important not to miss a dose. Call your doctor or health care professional if you are unable to keep an appointment. What may interact with this medicine? This medicine may interact with the following medications:  foscarnet  certain antibiotics like amikacin, gentamicin, neomycin, polymyxin B, streptomycin, tobramycin, vancomycin This list may not describe all possible interactions. Give your health care provider a list of all the medicines, herbs, non-prescription drugs, or dietary supplements you use. Also tell them if you smoke, drink alcohol, or use illegal drugs. Some items may interact with your medicine. What should I watch for while using this medicine? Your condition will be monitored carefully while you are receiving this medicine. You will need important blood work done while you are taking this medicine. This drug may make you feel generally unwell. This is not uncommon, as chemotherapy can affect healthy cells as well as cancer cells. Report any side effects. Continue your course of treatment even though you feel ill unless your doctor tells you to stop. This medicine may increase your  risk of getting an infection. Call your healthcare professional for advice if you get a fever, chills, or sore throat, or other symptoms of a cold or flu. Do not treat yourself. Try to avoid being around people who are sick. Avoid taking  medicines that contain aspirin, acetaminophen, ibuprofen, naproxen, or ketoprofen unless instructed by your healthcare professional. These medicines may hide a fever. This medicine may increase your risk to bruise or bleed. Call your doctor or health care professional if you notice any unusual bleeding. Be careful brushing and flossing your teeth or using a toothpick because you may get an infection or bleed more easily. If you have any dental work done, tell your dentist you are receiving this medicine. Do not become pregnant while taking this medicine or for 14 months after stopping it. Women should inform their healthcare professional if they wish to become pregnant or think they might be pregnant. Men should not father a child while taking this medicine and for 11 months after stopping it. There is potential for serious side effects to an unborn child. Talk to your healthcare professional for more information. Do not breast-feed an infant while taking this medicine. This medicine has caused ovarian failure in some women. This medicine may make it more difficult to get pregnant. Talk to your healthcare professional if you are concerned about your fertility. This medicine has caused decreased sperm counts in some men. This may make it more difficult to father a child. Talk to your healthcare professional if you are concerned about your fertility. Drink fluids as directed while you are taking this medicine. This will help protect your kidneys. Call your doctor or health care professional if you get diarrhea. Do not treat yourself. What side effects may I notice from receiving this medicine? Side effects that you should report to your doctor or health care professional as soon as possible:  allergic reactions like skin rash, itching or hives, swelling of the face, lips, or tongue  blurred vision  changes in vision  decreased hearing or ringing of the ears  nausea, vomiting  pain, redness, or  irritation at site where injected  pain, tingling, numbness in the hands or feet  signs and symptoms of bleeding such as bloody or black, tarry stools; red or dark brown urine; spitting up blood or brown material that looks like coffee grounds; red spots on the skin; unusual bruising or bleeding from the eyes, gums, or nose  signs and symptoms of infection like fever; chills; cough; sore throat; pain or trouble passing urine  signs and symptoms of kidney injury like trouble passing urine or change in the amount of urine  signs and symptoms of low red blood cells or anemia such as unusually weak or tired; feeling faint or lightheaded; falls; breathing problems Side effects that usually do not require medical attention (report to your doctor or health care professional if they continue or are bothersome):  loss of appetite  mouth sores  muscle cramps This list may not describe all possible side effects. Call your doctor for medical advice about side effects. You may report side effects to FDA at 1-800-FDA-1088. Where should I keep my medicine? This drug is given in a hospital or clinic and will not be stored at home. NOTE: This sheet is a summary. It may not cover all possible information. If you have questions about this medicine, talk to your doctor, pharmacist, or health care provider.  2021 Elsevier/Gold Standard (2018-06-30 15:59:17)

## 2020-10-10 ENCOUNTER — Ambulatory Visit
Admission: RE | Admit: 2020-10-10 | Discharge: 2020-10-10 | Disposition: A | Discharge status: Home / Self Care | Payer: Medicaid Other | Source: Ambulatory Visit | Attending: Radiation Oncology | Admitting: Radiation Oncology

## 2020-10-10 ENCOUNTER — Other Ambulatory Visit: Payer: Self-pay

## 2020-10-10 DIAGNOSIS — C090 Malignant neoplasm of tonsillar fossa: Secondary | ICD-10-CM | POA: Diagnosis not present

## 2020-10-10 DIAGNOSIS — Z510 Encounter for antineoplastic radiation therapy: Secondary | ICD-10-CM | POA: Diagnosis not present

## 2020-10-13 ENCOUNTER — Ambulatory Visit
Admission: RE | Admit: 2020-10-13 | Discharge: 2020-10-13 | Disposition: A | Discharge status: Home / Self Care | Payer: Medicaid Other | Source: Ambulatory Visit | Attending: Radiation Oncology | Admitting: Radiation Oncology

## 2020-10-13 ENCOUNTER — Other Ambulatory Visit: Payer: Self-pay

## 2020-10-13 ENCOUNTER — Other Ambulatory Visit: Payer: Self-pay | Admitting: Radiation Oncology

## 2020-10-13 DIAGNOSIS — C090 Malignant neoplasm of tonsillar fossa: Secondary | ICD-10-CM

## 2020-10-13 DIAGNOSIS — Z510 Encounter for antineoplastic radiation therapy: Secondary | ICD-10-CM | POA: Diagnosis not present

## 2020-10-13 MED ORDER — SONAFINE EX EMUL
1.0000 "application " | Freq: Two times a day (BID) | CUTANEOUS | Status: DC
  Administered 2020-10-13: 1 via TOPICAL

## 2020-10-13 MED ORDER — LIDOCAINE VISCOUS HCL 2 % MT SOLN: OROMUCOSAL | 4 refills | Status: DC

## 2020-10-13 NOTE — Progress Notes (Signed)

## 2020-10-14 ENCOUNTER — Ambulatory Visit
Admission: RE | Admit: 2020-10-14 | Discharge: 2020-10-14 | Disposition: A | Discharge status: Home / Self Care | Payer: Medicaid Other | Source: Ambulatory Visit | Attending: Radiation Oncology | Admitting: Radiation Oncology

## 2020-10-14 DIAGNOSIS — Z510 Encounter for antineoplastic radiation therapy: Secondary | ICD-10-CM | POA: Diagnosis not present

## 2020-10-14 DIAGNOSIS — C090 Malignant neoplasm of tonsillar fossa: Secondary | ICD-10-CM | POA: Diagnosis not present

## 2020-10-15 ENCOUNTER — Ambulatory Visit: Payer: Medicaid Other | Admitting: Hematology and Oncology

## 2020-10-15 ENCOUNTER — Other Ambulatory Visit: Payer: Self-pay

## 2020-10-15 ENCOUNTER — Ambulatory Visit: Payer: Medicaid Other

## 2020-10-15 ENCOUNTER — Other Ambulatory Visit: Payer: Medicaid Other

## 2020-10-15 ENCOUNTER — Ambulatory Visit
Admission: RE | Admit: 2020-10-15 | Discharge: 2020-10-15 | Disposition: A | Discharge status: Home / Self Care | Payer: Medicaid Other | Source: Ambulatory Visit | Attending: Radiation Oncology | Admitting: Radiation Oncology

## 2020-10-15 DIAGNOSIS — R1310 Dysphagia, unspecified: Secondary | ICD-10-CM

## 2020-10-15 DIAGNOSIS — Z510 Encounter for antineoplastic radiation therapy: Secondary | ICD-10-CM | POA: Diagnosis not present

## 2020-10-15 DIAGNOSIS — C090 Malignant neoplasm of tonsillar fossa: Secondary | ICD-10-CM | POA: Diagnosis not present

## 2020-10-15 NOTE — Therapy (Signed)
[facility] [address] [place], [place], [address] Phone: [phone]   Fax:  [phone]  Speech Language Pathology Evaluation  Patient Details  Name: Mitchell Cooper MRN: [id number] Date of Birth: May 25, 1961 Referring Provider (SLP): Eppie Gibson, MD   Encounter Date: 10/15/2020   End of Session - 10/15/20 1418    Visit Number 1    Number of Visits 7    Date for SLP Re-Evaluation 01/13/21    SLP Start Time 1150    SLP Stop Time  1232    SLP Time Calculation (min) 42 min    Activity Tolerance Patient tolerated treatment well           Past Medical History:  Diagnosis Date  . Cancer ([place])    Left Tonsillar Cancer   . DVT (deep venous thrombosis) ([facility])   . Hypertension 1997   has never taken medications  . Tobacco dependence     Past Surgical History:  Procedure Laterality Date  . IR GASTROSTOMY TUBE MOD SED  09/11/2020  . IR IMAGING GUIDED PORT INSERTION  09/11/2020  . KNEE SURGERY  approx. 1984   reconstructive surgery of left knee; arthroscopic procedure about 1997  . MULTIPLE EXTRACTIONS WITH ALVEOLOPLASTY N/A 09/25/2020   Procedure: MULTIPLE EXTRACTIONS WITH ALVEOLOPLASTY;  Surgeon: Charlaine Dalton, DMD;  Location: [facility];  Service: Dentistry;  Laterality: N/A;  . PERIPHERAL VASCULAR CATHETERIZATION Left 06/16/2016   Procedure: Lower Extremity Venography- Venus Thrombolysis;  Surgeon: Serafina Mitchell, MD;  Location: [facility];  Service: Cardiovascular;  Laterality: Left;    There were no vitals filed for this visit.   Subjective Assessment - 10/15/20 1200    Subjective Pt denies any s/sx difficulty swallowing at this time.    Patient is accompained by: Family member   Mitchell Cooper - dtr   Currently in Pain? Yes    Pain Score 1     Pain Location Throat    Pain Orientation Right    Pain Descriptors / Indicators Sore    Pain Onset Yesterday    Pain Frequency Constant               SLP Evaluation [facility] - 10/15/20 1203      SLP Visit Information   SLP Received On 10/15/20    Referring Provider (SLP) Eppie Gibson, MD    Onset Date Early 2022    Medical Diagnosis lt tonsillar fossa CA      Subjective   Patient/Family Stated Goal  HPI Maintaining swallowing that pt has currently   Mitchell Cooper is a 60 y.o. male who presented with one year history of enlarging left neck mass.Soft tissue neck CT scan performed on 07/23/2020 revealed mass in the left oropharynx which appeared to be arising from the soft palate extending inferiorly. Findings were compatible with carcinoma. There was also noted to be a large necrotic lymph node mass in the left neck that was compatible with metastatic disease. Additionally small ill-defined lymph nodes on the left could be due to tumor as well. Bil radiation to neck started 10-08-20, chemo started 10-09-20. Pt got PEG 09-11-20 and extractions 09-25-20.     Prior Functional Status   Cognitive/Linguistic Baseline Within functional limits           Pt currently tolerates regular diet and thin liquids. POs: Pt swallowed cereal bar and drank water without overt s/s aspiration. Thyroid elevation appeared adequate, and swallows appeared timely. Pt's swallow deemed WFL/WNL  at this time.   Because data states the risk for dysphagia during and after radiation treatment is high due to undergoing radiation tx, SLP taught pt about the possibility of reduced/limited ability for PO intake during rad tx. Among other modifications for days when pt cannot functionally swallow, SLP talked about doing decr'd reps/scope of HEP and also the importance of adding swallowing tasks back in when it becomes possible to do so.  SLP educated pt re: changes to swallowing musculature after rad tx, and why adherence to dysphagia HEP provided today and PO consumption was necessary to inhibit muscular disuse atrophy and to reduce muscle fibrosis following rad tx. Pt  demonstrated understanding of these things to SLP.    SLP then developed a HEP for pt and pt was instructed how to perform exercises involving lingual, vocal, and pharyngeal strengthening. SLP performed each exercise and pt return demonstrated each exercise. SLP ensured pt performance was correct prior to moving on to next exercise. Pt was instructed to complete this program 2-3 times a day, 6-7 days/week until 6 months after his last rad tx, then x2 a week after that.                  SLP Education - 10/15/20 1415    Education Details late effects of head/neck radiation on swallow function, HEP procedure    Person(s) Educated Patient;Child(ren)    Methods Explanation;Demonstration;Verbal cues;Handout    Comprehension Verbalized understanding;Returned demonstration;Verbal cues required;Need further instruction            SLP Short Term Goals - 10/15/20 1519      SLP SHORT TERM GOAL #1   Title pt will complete HEP with rare min A    Baseline total A    Time 2    Period --   sessions, for all STGs   Status New      SLP SHORT TERM GOAL #2   Title pt will tell SLP why pt is completing HEP with modified independence    Baseline total A    Time 1    Status New      SLP SHORT TERM GOAL #3   Title pt will tell SLP how a food journal could hasten return to a more normalized diet    Time 2    Status New      SLP SHORT TERM GOAL #4   Title pt will describe 3 overt s/s aspiration PNA with modified independence    Time 3    Status New            SLP Long Term Goals - 10/15/20 1521      SLP LONG TERM GOAL #1   Title pt will complete HEP with modified independence over two visits    Baseline total assist    Time 4    Period --   sessions, for all LTGs   Status New      SLP LONG TERM GOAL #2   Title pt will describe how to modify HEP over time, and the timeline associated with reduction in HEP frequency with modified independence over two sessions    Baseline not  provided yet    Time 6    Status New            Plan - 10/15/20 1419    Clinical Impression Statement At this time pt swallowing is deemed WNL/WFL with cereal bar (dys III) and water, although pt reports no difficulties with oral or  pharyngeal stage, and none seen today. SLP designed an individualized HEP for dysphagia and pt completed each exercise on their own with min cues faded to modified independent. Data indicate that pt's swallow ability will likely decrease over the course of radiation therapy and could very well decline over time following conclusion of their radiation therapy due to muscle disuse atrophy and/or muscle fibrosis. Pt will cont to need to be seen by SLP in order to assess safety of PO intake, assess the need for recommending any objective swallow assessment, and ensuring pt correctly completes the individualized HEP.    Speech Therapy Frequency --   once approx every 4 weeks   Treatment/Interventions Aspiration precaution training;Pharyngeal strengthening exercises;Trials of upgraded texture/liquids;Diet toleration management by SLP;Patient/family education;SLP instruction and feedback;Compensatory techniques    Potential to Achieve Goals Good    SLP Home Exercise Plan provided today    Consulted and Agree with Plan of Care Patient           Patient will benefit from skilled therapeutic intervention in order to improve the following deficits and impairments:   Dysphagia, unspecified type    Problem List Patient Active Problem List   Diagnosis Date Noted  . Dental caries   . Chronic periodontitis   . Carcinoma of tonsillar fossa ([facility]) 09/12/2020  . 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 Tonsillar mass 08/08/2020  . Mass of left side of neck 07/09/2020  . Former smoker 04/08/2020  . Primary osteoarthritis of both knees 04/08/2020  . VTE (venous thromboembolism) 03/19/2020  . RVF (right ventricular failure) ([facility]) 12/07/2019  . Recurrent  pulmonary emboli ([facility]) 11/03/2019  . 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 History of pulmonary embolus (PE) 06/14/2016  . Essential hypertension 06/14/2016  . CKD (chronic kidney disease) stage 3, GFR 30-59 ml/min ([facility]) 06/14/2016  . Hyperglycemia 06/14/2016    [place] ,[place], Rebecca  10/15/2020, 3:26 PM  [facility] [address] [place], [place], [address] Phone: [phone]   Fax:  [phone]  Name: PAYNE GARSKE MRN: [id number] Date of Birth: 1960/11/27

## 2020-10-15 NOTE — Patient Instructions (Signed)
  SWALLOWING EXERCISES Do these 6 days a week, until 6 months after your last day of radiation, then 2 times per week afterwards  1. Effortful Swallows - Press your tongue against the roof of your mouth for 3 seconds, then swallow your saliva or a sip of water as hard as you can - Repeat 10-15 times, 2-3 times a day, and use whenever you eat or drink  2. Masako Swallow - swallow with your tongue sticking out - Stick tongue out past your lips and gently bite tongue with your teeth - Swallow, while holding your tongue with your teeth - Repeat 10-15 times, 2-3 times a day *use a wet spoon if your mouth gets dry*  3. Mendelsohn Maneuver - swallow as hard as you can for 5 seconds - Start to swallow, and keep your Adam's apple up by squeezing hard with the muscles of the throat - Hold it for 5-7 seconds and then relax - Repeat 10-15 times, 2-3 times a day *use a wet spoon if your mouth gets dry*  4. Chin pushback - Open your mouth a little - Place your fist UNDER your chin near your neck - Tuck your chin but keep your mouth open,  - hold for 5 seconds - Repeat 10 times, 2-3 times a day

## 2020-10-16 ENCOUNTER — Inpatient Hospital Stay: Payer: Medicaid Other | Admitting: Nutrition

## 2020-10-16 ENCOUNTER — Inpatient Hospital Stay (HOSPITAL_BASED_OUTPATIENT_CLINIC_OR_DEPARTMENT_OTHER): Payer: Medicaid Other | Admitting: Hematology and Oncology

## 2020-10-16 ENCOUNTER — Ambulatory Visit
Admission: RE | Admit: 2020-10-16 | Discharge: 2020-10-16 | Disposition: A | Discharge status: Home / Self Care | Payer: Medicaid Other | Source: Ambulatory Visit | Attending: Radiation Oncology | Admitting: Radiation Oncology

## 2020-10-16 ENCOUNTER — Inpatient Hospital Stay: Payer: Medicaid Other

## 2020-10-16 ENCOUNTER — Encounter: Payer: Self-pay | Admitting: Hematology and Oncology

## 2020-10-16 VITALS — HR 98

## 2020-10-16 DIAGNOSIS — Z5111 Encounter for antineoplastic chemotherapy: Secondary | ICD-10-CM | POA: Diagnosis not present

## 2020-10-16 DIAGNOSIS — Z86711 Personal history of pulmonary embolism: Secondary | ICD-10-CM | POA: Diagnosis not present

## 2020-10-16 DIAGNOSIS — C090 Malignant neoplasm of tonsillar fossa: Secondary | ICD-10-CM | POA: Diagnosis not present

## 2020-10-16 DIAGNOSIS — C099 Malignant neoplasm of tonsil, unspecified: Secondary | ICD-10-CM

## 2020-10-16 DIAGNOSIS — Z510 Encounter for antineoplastic radiation therapy: Secondary | ICD-10-CM | POA: Diagnosis not present

## 2020-10-16 LAB — CBC WITH DIFFERENTIAL/PLATELET
Abs Immature Granulocytes: 0.06 10*3/uL (ref 0.00–0.07)
Basophils Absolute: 0 10*3/uL (ref 0.0–0.1)
Basophils Relative: 0 %
Eosinophils Absolute: 0.2 10*3/uL (ref 0.0–0.5)
Eosinophils Relative: 3 %
HCT: 41.8 % (ref 39.0–52.0)
Hemoglobin: 14.1 g/dL (ref 13.0–17.0)
Immature Granulocytes: 1 %
Lymphocytes Relative: 11 %
Lymphs Abs: 0.9 10*3/uL (ref 0.7–4.0)
MCH: 26.6 pg (ref 26.0–34.0)
MCHC: 33.7 g/dL (ref 30.0–36.0)
MCV: 78.9 fL — ABNORMAL LOW (ref 80.0–100.0)
Monocytes Absolute: 0.9 10*3/uL (ref 0.1–1.0)
Monocytes Relative: 11 %
Neutro Abs: 5.8 10*3/uL (ref 1.7–7.7)
Neutrophils Relative %: 74 %
Platelets: 272 10*3/uL (ref 150–400)
RBC: 5.3 MIL/uL (ref 4.22–5.81)
RDW: 14.8 % (ref 11.5–15.5)
WBC: 7.8 10*3/uL (ref 4.0–10.5)
nRBC: 0 % (ref 0.0–0.2)

## 2020-10-16 LAB — BASIC METABOLIC PANEL
Anion gap: 12 (ref 5–15)
BUN: 15 mg/dL (ref 6–20)
CO2: 23 mmol/L (ref 22–32)
Calcium: 8.4 mg/dL — ABNORMAL LOW (ref 8.9–10.3)
Chloride: 104 mmol/L (ref 98–111)
Creatinine, Ser: 1.2 mg/dL (ref 0.61–1.24)
GFR, Estimated: >60 mL/min (ref >60)
Glucose, Bld: 136 mg/dL — ABNORMAL HIGH (ref 70–99)
Potassium: 3.8 mmol/L (ref 3.5–5.1)
Sodium: 139 mmol/L (ref 135–145)

## 2020-10-16 LAB — MAGNESIUM: Magnesium: 1.9 mg/dL (ref 1.7–2.4)

## 2020-10-16 MED ORDER — MAGNESIUM SULFATE 2 GM/50ML IV SOLN
2.0000 g | Freq: Once | INTRAVENOUS | Status: AC
  Administered 2020-10-16: 2 g via INTRAVENOUS

## 2020-10-16 MED ORDER — SODIUM CHLORIDE 0.9 % IV SOLN
40.0000 mg/m2 | Freq: Once | INTRAVENOUS | Status: AC
  Administered 2020-10-16: 88 mg via INTRAVENOUS
  Filled 2020-10-16: qty 88

## 2020-10-16 MED ORDER — POTASSIUM CHLORIDE IN NACL 20-0.9 MEQ/L-% IV SOLN
Freq: Once | INTRAVENOUS | Status: AC
  Filled 2020-10-16: qty 1000

## 2020-10-16 MED ORDER — SODIUM CHLORIDE 0.9 % IV SOLN: Freq: Once | INTRAVENOUS | Status: DC

## 2020-10-16 MED ORDER — HEPARIN SOD (PORK) LOCK FLUSH 100 UNIT/ML IV SOLN
500.0000 [IU] | Freq: Once | INTRAVENOUS | Status: AC | PRN
Start: 2020-10-16 — End: 2020-10-16
  Administered 2020-10-16: 500 [IU]
  Filled 2020-10-16: qty 5

## 2020-10-16 MED ORDER — MAGNESIUM SULFATE 2 GM/50ML IV SOLN
INTRAVENOUS | Status: AC
  Filled 2020-10-16: qty 50

## 2020-10-16 MED ORDER — SODIUM CHLORIDE 0.9 % IV SOLN
10.0000 mg | Freq: Once | INTRAVENOUS | Status: AC
  Administered 2020-10-16: 10 mg via INTRAVENOUS
  Filled 2020-10-16: qty 10

## 2020-10-16 MED ORDER — SODIUM CHLORIDE 0.9 % IV SOLN
Freq: Once | INTRAVENOUS | Status: AC
  Filled 2020-10-16: qty 250

## 2020-10-16 MED ORDER — PALONOSETRON HCL INJECTION 0.25 MG/5ML
0.2500 mg | Freq: Once | INTRAVENOUS | Status: AC
  Administered 2020-10-16: 0.25 mg via INTRAVENOUS

## 2020-10-16 MED ORDER — PALONOSETRON HCL INJECTION 0.25 MG/5ML
INTRAVENOUS | Status: AC
  Filled 2020-10-16: qty 5

## 2020-10-16 MED ORDER — SODIUM CHLORIDE 0.9 % IV SOLN
150.0000 mg | Freq: Once | INTRAVENOUS | Status: AC
  Administered 2020-10-16: 150 mg via INTRAVENOUS
  Filled 2020-10-16: qty 150

## 2020-10-16 MED ORDER — SODIUM CHLORIDE 0.9% FLUSH
10.0000 mL | INTRAVENOUS | Status: DC | PRN
  Administered 2020-10-16: 10 mL
  Filled 2020-10-16: qty 10

## 2020-10-16 NOTE — Assessment & Plan Note (Signed)
He had 2 episodes of PE, hence will continue indefinite anticoagulation at this time. No indication for hypercoagulable work up. He understands the risk of bleeding with blood thinners on board. No changes in symptoms or concerns for DVT or PE No adverse effects from Xarelto.

## 2020-10-16 NOTE — Progress Notes (Signed)
[place] NOTE  Patient Care Team: Ladell Pier, MD as PCP - General (Internal Medicine) Melida Quitter, MD as Consulting Physician (Otolaryngology) Eppie Gibson, MD as Consulting Physician (Radiation Oncology) Benay Pike, MD as Consulting Physician (Hematology and Oncology) Malmfelt, Stephani Police, RN as Oncology Nurse Navigator  CHIEF COMPLAINTS/PURPOSE OF CONSULTATION:  New diagnosis of left tonsillar cancer.  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 Given the above information and very locally advanced disease, we decided to proceed with concurrent chemoradiation with weekly cisplatin. C1D1 of cisplatin started on 10/09/2020 C2D1 of cisplatin today, 10/16/2020 He has no concerning ROS. PE today with softer and slightly smaller neck mass,  I have reviewed pertinent labs, satisfactory to proceed Reviewed all medications and he will start dexamethasone tomorrow. RTC in one week.  History of pulmonary embolus (PE) He had 2 episodes of PE, hence will continue indefinite anticoagulation at this time. No indication for hypercoagulable work up. He understands the risk of bleeding with blood thinners on board. No changes in symptoms or concerns for DVT or PE No adverse effects from Xarelto.  No orders of the defined types were placed in this encounter.  HISTORY OF PRESENTING ILLNESS:   Mitchell Cooper 60 y.o. male is here because of new diagnosis of tonsil cancer  Mitchell Cooper is a 60 y.o. male who presents as a consultation at the request of Dr.Bates with new diagnosis of poorly differentiated carcinoma noted on tonsilar biopsy. He initially presented with a chief complaint of neck mass going on for more than one year, care delayed partly by the pandemic. His neck mass continued to progress in size. He otherwise denied  any dysphagia, speech issues, otalgia or weight loss He had two episodes of PE in 2018 and 2021 and is on indefinite anticoagulation with xarelto. According to patient there was no clear etiology for these PE  Except for long periods of immobilization for work. He otherwise has HTN, well controlled on norvasc He is not a heavy smoker, smoked a pack in a week or so, quit smoking in 2018, resumed in 2021. He was a Architect worked by occupation.  Imaging and work up done so far.  07/23/2020  IMPRESSION: 1. 2.4 x 4.7 cm mass in the left oropharynx which appears to be arising from the soft palate extending inferiorly. Findings compatible with carcinoma. 2. Large necrotic lymph node mass in the left neck compatible with metastatic disease. Additional small ill-defined lymph nodes the left could be due to tumor as well. 3. Poor dentition.  08/27/2020  Final Pathologic Diagnosis    A.  LEFT TONSIL, BIOPSY:              Poorly differentiated malignant neoplasm.              See comment.   Electronically signed by Lowella Bandy, MD on 08/13/2020 at 12:10 PM  Comment    This biopsy show the presence of a poorly differentiated malignant neoplasm located at the subepithelial stroma.  Immunostains with appropriate controls show that tumor cells are positive for vimentin and p16, and negative for pancytokeratin (AE1/3), low molecular weight cytokeratin, CK5/6, pan melanoma markers, SOX10, S100, desmin, CD34, CD30  and p40.  CD45, CD3 and CD20 are positive in some background lymphocytes and the tumor cells appear to be negative.  Ki67 is positive in about 40%  of the tumor cells.  The findings are consistent with a poorly differentiated malignant neoplasm of undetermined classification.  This tumor shows negative reaction for multiple keratin markers, but a very poorly differentiated carcinoma still can not be entirely ruled.  Because of the negative reaction for multiple markers of melanoma, a malignant  melanoma is less likely.  Dr. Sherran Needs also reviewed this case, and believes that the findings in this biopsy are not diagnostic of lymphoma.  A malignant mesenchymal neoplasm is possible, but other types of malignant neoplasm can not be ruled out.  Since this is a relatively small biopsy, additional sampling of the lesion may probably provide additional findings to help further classification of this tumor.  Clinical correlation is recommended.    Dr. Tommas Olp reviewed this case and agrees with this diagnosis.    08/27/2020  IMPRESSION: 1. Left tonsillar mass is hypermetabolic and consistent with neoplasm. 2. Bulky necrotic left metastatic nodal disease. 3. No findings for metastatic disease involving the chest, abdomen/pelvis or bony structures.  Interval history  He is here for C2 of cisplatin chemotherapy He is doing very well, he is able to swallow much better. He denies any pain. He is maintaining his weight, gained a couple pounds compared to his last visit He denies any changes in breathing, bowel habits or urinary habits No new neurological complaints. No neuropathy, hearing changes or change in urinary habits. Rest of the pertinent 10 point ROS reviewed and negative.  MEDICAL HISTORY:  Past Medical History:  Diagnosis Date  . Cancer ([place])    Left Tonsillar Cancer   . DVT (deep venous thrombosis) ([place])   . Hypertension 1997   has never taken medications  . Tobacco dependence     SURGICAL HISTORY: Past Surgical History:  Procedure Laterality Date  . IR GASTROSTOMY TUBE MOD SED  09/11/2020  . IR IMAGING GUIDED PORT INSERTION  09/11/2020  . KNEE SURGERY  approx. 1984   reconstructive surgery of left knee; arthroscopic procedure about 1997  . MULTIPLE EXTRACTIONS WITH ALVEOLOPLASTY N/A 09/25/2020   Procedure: MULTIPLE EXTRACTIONS WITH ALVEOLOPLASTY;  Surgeon: Charlaine Dalton, DMD;  Location: [facility];  Service: Dentistry;  Laterality: N/A;  . PERIPHERAL VASCULAR  CATHETERIZATION Left 06/16/2016   Procedure: Lower Extremity Venography- Venus Thrombolysis;  Surgeon: Serafina Mitchell, MD;  Location: [facility];  Service: Cardiovascular;  Laterality: Left;    SOCIAL HISTORY: Social History   Socioeconomic History  . Marital status: Married    Spouse name: Not on file  . Number of children: Not on file  . Years of education: Not on file  . Highest education level: Not on file  Occupational History  . Occupation: Architect  Tobacco Use  . Smoking status: Current Some Day Smoker    Types: Cigarettes    Last attempt to quit: 11/21/2016    Years since quitting: 3.9  . Smokeless tobacco: Never Used  Vaping Use  . Vaping Use: Never used  Substance and Sexual Activity  . Alcohol use: Yes    Alcohol/week: 6.0 standard drinks    Types: 6 Cans of beer per week  . Drug use: No  . Sexual activity: Not Currently  Other Topics Concern  . Not on file  Social History Narrative  . Not on file   Social Determinants of Health   Financial Resource Strain: Low Risk   . Difficulty of Paying Living Expenses: Not very hard  Food Insecurity: No Food Insecurity  . Worried About Running  Out of Food in the Last Year: Never true  . Ran Out of Food in the Last Year: Never true  Transportation Needs: No Transportation Needs  . Lack of Transportation (Medical): No  . Lack of Transportation (Non-Medical): No  Physical Activity: Inactive  . Days of Exercise per Week: 0 days  . Minutes of Exercise per Session: 0 min  Stress: No Stress Concern Present  . Feeling of Stress : Only a little  Social Connections: Socially Integrated  . Frequency of Communication with Friends and Family: More than three times a week  . Frequency of Social Gatherings with Friends and Family: More than three times a week  . Attends Religious Services: More than 4 times per year  . Active Member of Clubs or Organizations: Yes  . Attends Archivist Meetings: More than 4  times per year  . Marital Status: Married  Human resources officer Violence: Not on file    FAMILY HISTORY: Family History  Problem Relation Age of Onset  . CAD Mother 35    ALLERGIES:  has No Known Allergies.  MEDICATIONS:  Current Outpatient Medications  Medication Sig Dispense Refill  . amLODipine (NORVASC) 10 MG tablet Take 1 tablet (10 mg total) by mouth daily. 30 tablet 6  . dexamethasone (DECADRON) 4 MG tablet Take 2 tablets (8 mg total) by mouth daily. Take daily x 3 days starting the day after cisplatin chemotherapy. Take with food. (Patient not taking: Reported on 10/02/2020) 30 tablet 1  . lidocaine (XYLOCAINE) 2 % solution Patient: Mix 1part 2% viscous lidocaine, 1part H20. Swish & swallow 62m of diluted mixture, 275m before meals and at bedtime, up to QID 200 mL 4  . lidocaine-prilocaine (EMLA) cream Apply to affected area once 30 g 3  . ondansetron (ZOFRAN) 8 MG tablet Take 1 tablet (8 mg total) by mouth 2 (two) times daily as needed. Start on the third day after cisplatin chemotherapy. 30 tablet 1  . prochlorperazine (COMPAZINE) 10 MG tablet Take 1 tablet (10 mg total) by mouth every 6 (six) hours as needed (Nausea or vomiting). 30 tablet 1  . rivaroxaban (XARELTO) 20 MG TABS tablet Take 1 tablet (20 mg total) by mouth daily with supper. 30 tablet 6   No current facility-administered medications for this visit.   Facility-Administered Medications Ordered in Other Visits  Medication Dose Route Frequency Provider Last Rate Last Admin  . heparin lock flush 100 unit/mL  500 Units Intracatheter Once PRN Saragrace Selke, MD      . magnesium sulfate IVPB 2 g 50 mL  2 g Intravenous Once IrBenay PikeMD 50 mL/hr at 10/16/20 0937 2 g at 10/16/20 0937  . sodium chloride flush (NS) 0.9 % injection 10 mL  10 mL Intracatheter PRN Keelyn Fjelstad, MD         PHYSICAL EXAMINATION: ECOG PERFORMANCE STATUS: 0 - Asymptomatic  Vitals:   10/16/20 0844  BP: 128/80  Pulse: (!) 104   Resp: 20  Temp: 97.9 F (36.6 C)  SpO2: 99%   Filed Weights   10/16/20 0844  Weight: 213 lb 9.6 oz (96.9 kg)    Physical Exam Constitutional:      Appearance: Normal appearance.  HENT:     Head: Normocephalic and atraumatic.     Mouth/Throat:     Mouth: Mucous membranes are moist.     Pharynx: No oropharyngeal exudate or posterior oropharyngeal erythema.  Cardiovascular:     Rate and Rhythm: Normal rate and regular rhythm.  Pulses: Normal pulses.     Heart sounds: Normal heart sounds.  Pulmonary:     Effort: Pulmonary effort is normal.     Breath sounds: Normal breath sounds.  Abdominal:     General: Abdomen is flat.     Palpations: Abdomen is soft.  Musculoskeletal:        General: No swelling or tenderness.     Cervical back: No rigidity or tenderness.  Lymphadenopathy:     Cervical: Cervical adenopathy (left sided cervical lymphadenopathy measuring about 4 cms on todays visit, feels softer consistent with response) present.  Neurological:     General: No focal deficit present.     Mental Status: He is alert.  Psychiatric:        Mood and Affect: Mood normal.      LABORATORY DATA:  I have reviewed the data as listed Lab Results  Component Value Date   WBC 7.8 10/16/2020   HGB 14.1 10/16/2020   HCT 41.8 10/16/2020   MCV 78.9 (L) 10/16/2020   PLT 272 10/16/2020     Chemistry      Component Value Date/Time   NA 139 10/16/2020 0827   NA 139 12/10/2019 1348   K 3.8 10/16/2020 0827   CL 104 10/16/2020 0827   CO2 23 10/16/2020 0827   BUN 15 10/16/2020 0827   BUN 17 12/10/2019 1348   CREATININE 1.20 10/16/2020 0827   CREATININE 1.17 09/03/2020 1534      Component Value Date/Time   CALCIUM 8.4 (L) 10/16/2020 0827   ALKPHOS 72 09/03/2020 1534   AST 12 (L) 09/03/2020 1534   ALT 12 09/03/2020 1534   BILITOT 0.4 09/03/2020 1534      RADIOGRAPHIC STUDIES: I have personally reviewed the radiological images as listed and agreed with the findings in  the report. No results found. I independently reviewed PET imaging from outside facility  09/23/2020   Final Pathologic Diagnosis     "TONSIL", BIOPSY: Carcinoma, favor poorly differentiated squamous cell carcinoma. See comment.    Comment    Immunohistochemical stains are performed to characterize the tumor. The tumor cells are positive for EMA (patchy) and p63 (focal); negative for SMA and ERG. These findings, along with the morphologic features and prior immunohistochemical studies (case [id number]), support the diagnosis of carcinoma. A poorly differentiated, p16 positive squamous cell carcinoma is favored. The control(s) worked appropriately.   Labs from today reviewed, ok to proceed with treatment.  All questions were answered. The patient knows to call the clinic with any problems, questions or concerns. I spent over 30 minutes in the care of this patient including H and P, review of records, counseling and coordination of care.     Benay Pike, MD 10/16/2020 10:09 AM

## 2020-10-16 NOTE — Assessment & Plan Note (Signed)
SCC left tonsil, T3 N3 M0/ Stage III, P16 positive Repeat biopsy suggested poorly differentiated SCC. Given the above information and very locally advanced disease, we decided to proceed with concurrent chemoradiation with weekly cisplatin. C1D1 of cisplatin started on 10/09/2020 C2D1 of cisplatin today, 10/16/2020 He has no concerning ROS. PE today with softer and slightly smaller neck mass,  I have reviewed pertinent labs, satisfactory to proceed Reviewed all medications and he will start dexamethasone tomorrow. RTC in one week.

## 2020-10-16 NOTE — Progress Notes (Signed)
Nutrition follow-up completed with patient during infusion for tonsil cancer. Weight improved and documented as 213.6 pounds, increased from 211.4 pounds March 24. Patient reports he is eating well and flushing his tube with water.   He drinks 2 cartons of Ensure Plus or equivalent daily. Reports no nausea and vomiting.  Bowels are moving well. He has no questions or concerns at this time.  Estimated nutrition needs: 2400-2600 cal, 120-140 g protein, 2.5 L fluid.  Nutrition diagnosis: Food and nutrition related knowledge deficit improved.  Intervention: Continue strategies for increased calories and protein for weight maintenance. Continue oral nutrition supplements 2 cartons daily. Continue feeding tube care. Educated increase oral nutrition supplements if oral intake declines before next visit.  Monitoring, evaluation, goals: Patient will tolerate adequate calories and protein to minimize weight loss.  Next visit: Thursday, April 7 during infusion.  **Disclaimer: This note was dictated with voice recognition software. Similar sounding words can inadvertently be transcribed and this note may contain transcription errors which may not have been corrected upon publication of note.**

## 2020-10-16 NOTE — Patient Instructions (Signed)
[facility] Discharge Instructions for Patients Receiving Chemotherapy  Today you received the following chemotherapy agents: Cisplatin  To help prevent nausea and vomiting after your treatment, we encourage you to take your nausea medication as directed.   If you develop nausea and vomiting that is not controlled by your nausea medication, call the clinic.   BELOW ARE SYMPTOMS THAT SHOULD BE REPORTED IMMEDIATELY:  *FEVER GREATER THAN 100.5 F  *CHILLS WITH OR WITHOUT FEVER  NAUSEA AND VOMITING THAT IS NOT CONTROLLED WITH YOUR NAUSEA MEDICATION  *UNUSUAL SHORTNESS OF BREATH  *UNUSUAL BRUISING OR BLEEDING  TENDERNESS IN MOUTH AND THROAT WITH OR WITHOUT PRESENCE OF ULCERS  *URINARY PROBLEMS  *BOWEL PROBLEMS  UNUSUAL RASH Items with * indicate a potential emergency and should be followed up as soon as possible.  Feel free to call the clinic should you have any questions or concerns. The clinic phone number is [phone].  Please show the [facility] at check-in to the Emergency Department and triage nurse.  Cisplatin injection What is this medicine? CISPLATIN (SIS pla tin) is a chemotherapy drug. It targets fast dividing cells, like cancer cells, and causes these cells to die. This medicine is used to treat many types of cancer like bladder, ovarian, and testicular cancers. This medicine may be used for other purposes; ask your health care provider or pharmacist if you have questions. COMMON BRAND NAME(S): Platinol, Platinol -AQ What should I tell my health care provider before I take this medicine? They need to know if you have any of these conditions:  eye disease, vision problems  hearing problems  kidney disease  low blood counts, like white cells, platelets, or red blood cells  tingling of the fingers or toes, or other nerve disorder  an unusual or allergic reaction to cisplatin, carboplatin, oxaliplatin, other medicines, foods, dyes, or  preservatives  pregnant or trying to get pregnant  breast-feeding How should I use this medicine? This drug is given as an infusion into a vein. It is administered in a hospital or clinic by a specially trained health care professional. Talk to your pediatrician regarding the use of this medicine in children. Special care may be needed. Overdosage: If you think you have taken too much of this medicine contact a poison control center or emergency room at once. NOTE: This medicine is only for you. Do not share this medicine with others. What if I miss a dose? It is important not to miss a dose. Call your doctor or health care professional if you are unable to keep an appointment. What may interact with this medicine? This medicine may interact with the following medications:  foscarnet  certain antibiotics like amikacin, gentamicin, neomycin, polymyxin B, streptomycin, tobramycin, vancomycin This list may not describe all possible interactions. Give your health care provider a list of all the medicines, herbs, non-prescription drugs, or dietary supplements you use. Also tell them if you smoke, drink alcohol, or use illegal drugs. Some items may interact with your medicine. What should I watch for while using this medicine? Your condition will be monitored carefully while you are receiving this medicine. You will need important blood work done while you are taking this medicine. This drug may make you feel generally unwell. This is not uncommon, as chemotherapy can affect healthy cells as well as cancer cells. Report any side effects. Continue your course of treatment even though you feel ill unless your doctor tells you to stop. This medicine may increase your  risk of getting an infection. Call your healthcare professional for advice if you get a fever, chills, or sore throat, or other symptoms of a cold or flu. Do not treat yourself. Try to avoid being around people who are sick. Avoid taking  medicines that contain aspirin, acetaminophen, ibuprofen, naproxen, or ketoprofen unless instructed by your healthcare professional. These medicines may hide a fever. This medicine may increase your risk to bruise or bleed. Call your doctor or health care professional if you notice any unusual bleeding. Be careful brushing and flossing your teeth or using a toothpick because you may get an infection or bleed more easily. If you have any dental work done, tell your dentist you are receiving this medicine. Do not become pregnant while taking this medicine or for 14 months after stopping it. Women should inform their healthcare professional if they wish to become pregnant or think they might be pregnant. Men should not father a child while taking this medicine and for 11 months after stopping it. There is potential for serious side effects to an unborn child. Talk to your healthcare professional for more information. Do not breast-feed an infant while taking this medicine. This medicine has caused ovarian failure in some women. This medicine may make it more difficult to get pregnant. Talk to your healthcare professional if you are concerned about your fertility. This medicine has caused decreased sperm counts in some men. This may make it more difficult to father a child. Talk to your healthcare professional if you are concerned about your fertility. Drink fluids as directed while you are taking this medicine. This will help protect your kidneys. Call your doctor or health care professional if you get diarrhea. Do not treat yourself. What side effects may I notice from receiving this medicine? Side effects that you should report to your doctor or health care professional as soon as possible:  allergic reactions like skin rash, itching or hives, swelling of the face, lips, or tongue  blurred vision  changes in vision  decreased hearing or ringing of the ears  nausea, vomiting  pain, redness, or  irritation at site where injected  pain, tingling, numbness in the hands or feet  signs and symptoms of bleeding such as bloody or black, tarry stools; red or dark brown urine; spitting up blood or brown material that looks like coffee grounds; red spots on the skin; unusual bruising or bleeding from the eyes, gums, or nose  signs and symptoms of infection like fever; chills; cough; sore throat; pain or trouble passing urine  signs and symptoms of kidney injury like trouble passing urine or change in the amount of urine  signs and symptoms of low red blood cells or anemia such as unusually weak or tired; feeling faint or lightheaded; falls; breathing problems Side effects that usually do not require medical attention (report to your doctor or health care professional if they continue or are bothersome):  loss of appetite  mouth sores  muscle cramps This list may not describe all possible side effects. Call your doctor for medical advice about side effects. You may report side effects to FDA at 1-800-FDA-1088. Where should I keep my medicine? This drug is given in a hospital or clinic and will not be stored at home. NOTE: This sheet is a summary. It may not cover all possible information. If you have questions about this medicine, talk to your doctor, pharmacist, or health care provider.  2021 Elsevier/Gold Standard (2018-06-30 15:59:17)

## 2020-10-17 ENCOUNTER — Ambulatory Visit
Admission: RE | Admit: 2020-10-17 | Discharge: 2020-10-17 | Disposition: A | Discharge status: Home / Self Care | Payer: Medicaid Other | Source: Ambulatory Visit | Attending: Radiation Oncology | Admitting: Radiation Oncology

## 2020-10-17 ENCOUNTER — Other Ambulatory Visit: Payer: Self-pay

## 2020-10-17 DIAGNOSIS — Z510 Encounter for antineoplastic radiation therapy: Secondary | ICD-10-CM | POA: Insufficient documentation

## 2020-10-17 DIAGNOSIS — C090 Malignant neoplasm of tonsillar fossa: Secondary | ICD-10-CM | POA: Insufficient documentation

## 2020-10-18 ENCOUNTER — Other Ambulatory Visit: Payer: Self-pay

## 2020-10-20 ENCOUNTER — Ambulatory Visit
Admission: RE | Admit: 2020-10-20 | Discharge: 2020-10-20 | Disposition: A | Discharge status: Home / Self Care | Payer: Medicaid Other | Source: Ambulatory Visit | Attending: Radiation Oncology | Admitting: Radiation Oncology

## 2020-10-20 ENCOUNTER — Other Ambulatory Visit: Payer: Self-pay

## 2020-10-20 DIAGNOSIS — Z510 Encounter for antineoplastic radiation therapy: Secondary | ICD-10-CM | POA: Diagnosis not present

## 2020-10-20 DIAGNOSIS — C090 Malignant neoplasm of tonsillar fossa: Secondary | ICD-10-CM | POA: Diagnosis not present

## 2020-10-21 ENCOUNTER — Ambulatory Visit
Admission: RE | Admit: 2020-10-21 | Discharge: 2020-10-21 | Disposition: A | Discharge status: Home / Self Care | Payer: Medicaid Other | Source: Ambulatory Visit | Attending: Radiation Oncology | Admitting: Radiation Oncology

## 2020-10-21 ENCOUNTER — Other Ambulatory Visit: Payer: Self-pay | Admitting: Radiation Oncology

## 2020-10-21 DIAGNOSIS — C090 Malignant neoplasm of tonsillar fossa: Secondary | ICD-10-CM

## 2020-10-21 DIAGNOSIS — Z510 Encounter for antineoplastic radiation therapy: Secondary | ICD-10-CM | POA: Diagnosis not present

## 2020-10-21 MED ORDER — FLUCONAZOLE 100 MG PO TABS: ORAL_TABLET | ORAL | 0 refills | Status: DC

## 2020-10-22 ENCOUNTER — Ambulatory Visit
Admission: RE | Admit: 2020-10-22 | Discharge: 2020-10-22 | Disposition: A | Discharge status: Home / Self Care | Payer: Medicaid Other | Source: Ambulatory Visit | Attending: Radiation Oncology | Admitting: Radiation Oncology

## 2020-10-22 ENCOUNTER — Ambulatory Visit: Payer: Medicaid Other | Admitting: Hematology and Oncology

## 2020-10-22 ENCOUNTER — Ambulatory Visit: Payer: Medicaid Other

## 2020-10-22 ENCOUNTER — Other Ambulatory Visit: Payer: Medicaid Other

## 2020-10-22 DIAGNOSIS — C090 Malignant neoplasm of tonsillar fossa: Secondary | ICD-10-CM | POA: Diagnosis not present

## 2020-10-22 DIAGNOSIS — Z510 Encounter for antineoplastic radiation therapy: Secondary | ICD-10-CM | POA: Diagnosis not present

## 2020-10-23 ENCOUNTER — Other Ambulatory Visit: Payer: Self-pay

## 2020-10-23 ENCOUNTER — Inpatient Hospital Stay: Payer: Medicaid Other

## 2020-10-23 ENCOUNTER — Other Ambulatory Visit (HOSPITAL_COMMUNITY): Payer: Self-pay

## 2020-10-23 ENCOUNTER — Ambulatory Visit
Admission: RE | Admit: 2020-10-23 | Discharge: 2020-10-23 | Disposition: A | Discharge status: Home / Self Care | Payer: Medicaid Other | Source: Ambulatory Visit | Attending: Radiation Oncology | Admitting: Radiation Oncology

## 2020-10-23 ENCOUNTER — Encounter: Payer: Self-pay | Admitting: Hematology and Oncology

## 2020-10-23 ENCOUNTER — Inpatient Hospital Stay: Payer: Medicaid Other | Admitting: Nutrition

## 2020-10-23 ENCOUNTER — Inpatient Hospital Stay (HOSPITAL_BASED_OUTPATIENT_CLINIC_OR_DEPARTMENT_OTHER): Payer: Medicaid Other | Admitting: Hematology and Oncology

## 2020-10-23 VITALS — BP 115/92 | HR 94 | Temp 97.0°F | Resp 18 | Wt 200.1 lb

## 2020-10-23 DIAGNOSIS — J358 Other chronic diseases of tonsils and adenoids: Secondary | ICD-10-CM | POA: Diagnosis not present

## 2020-10-23 DIAGNOSIS — R634 Abnormal weight loss: Secondary | ICD-10-CM | POA: Insufficient documentation

## 2020-10-23 DIAGNOSIS — Z86718 Personal history of other venous thrombosis and embolism: Secondary | ICD-10-CM | POA: Insufficient documentation

## 2020-10-23 DIAGNOSIS — R112 Nausea with vomiting, unspecified: Secondary | ICD-10-CM | POA: Diagnosis not present

## 2020-10-23 DIAGNOSIS — Z7901 Long term (current) use of anticoagulants: Secondary | ICD-10-CM | POA: Insufficient documentation

## 2020-10-23 DIAGNOSIS — C099 Malignant neoplasm of tonsil, unspecified: Secondary | ICD-10-CM

## 2020-10-23 DIAGNOSIS — Z510 Encounter for antineoplastic radiation therapy: Secondary | ICD-10-CM | POA: Diagnosis not present

## 2020-10-23 DIAGNOSIS — Z5111 Encounter for antineoplastic chemotherapy: Secondary | ICD-10-CM | POA: Insufficient documentation

## 2020-10-23 DIAGNOSIS — Z95828 Presence of other vascular implants and grafts: Secondary | ICD-10-CM

## 2020-10-23 DIAGNOSIS — I10 Essential (primary) hypertension: Secondary | ICD-10-CM | POA: Insufficient documentation

## 2020-10-23 DIAGNOSIS — C090 Malignant neoplasm of tonsillar fossa: Secondary | ICD-10-CM | POA: Diagnosis not present

## 2020-10-23 DIAGNOSIS — D708 Other neutropenia: Secondary | ICD-10-CM | POA: Insufficient documentation

## 2020-10-23 DIAGNOSIS — Z7952 Long term (current) use of systemic steroids: Secondary | ICD-10-CM | POA: Insufficient documentation

## 2020-10-23 DIAGNOSIS — I2699 Other pulmonary embolism without acute cor pulmonale: Secondary | ICD-10-CM | POA: Diagnosis not present

## 2020-10-23 DIAGNOSIS — F17210 Nicotine dependence, cigarettes, uncomplicated: Secondary | ICD-10-CM | POA: Insufficient documentation

## 2020-10-23 DIAGNOSIS — K1231 Oral mucositis (ulcerative) due to antineoplastic therapy: Secondary | ICD-10-CM | POA: Diagnosis not present

## 2020-10-23 DIAGNOSIS — T451X5A Adverse effect of antineoplastic and immunosuppressive drugs, initial encounter: Secondary | ICD-10-CM | POA: Diagnosis not present

## 2020-10-23 DIAGNOSIS — Z79899 Other long term (current) drug therapy: Secondary | ICD-10-CM | POA: Insufficient documentation

## 2020-10-23 DIAGNOSIS — N183 Chronic kidney disease, stage 3 unspecified: Secondary | ICD-10-CM | POA: Diagnosis not present

## 2020-10-23 LAB — CBC WITH DIFFERENTIAL/PLATELET
Abs Immature Granulocytes: 0.02 10*3/uL (ref 0.00–0.07)
Basophils Absolute: 0 10*3/uL (ref 0.0–0.1)
Basophils Relative: 0 %
Eosinophils Absolute: 0.1 10*3/uL (ref 0.0–0.5)
Eosinophils Relative: 1 %
HCT: 40.1 % (ref 39.0–52.0)
Hemoglobin: 13.7 g/dL (ref 13.0–17.0)
Immature Granulocytes: 0 %
Lymphocytes Relative: 5 %
Lymphs Abs: 0.5 10*3/uL — ABNORMAL LOW (ref 0.7–4.0)
MCH: 26.7 pg (ref 26.0–34.0)
MCHC: 34.2 g/dL (ref 30.0–36.0)
MCV: 78.2 fL — ABNORMAL LOW (ref 80.0–100.0)
Monocytes Absolute: 1.2 10*3/uL — ABNORMAL HIGH (ref 0.1–1.0)
Monocytes Relative: 13 %
Neutro Abs: 7.3 10*3/uL (ref 1.7–7.7)
Neutrophils Relative %: 81 %
Platelets: 149 10*3/uL — ABNORMAL LOW (ref 150–400)
RBC: 5.13 MIL/uL (ref 4.22–5.81)
RDW: 15.6 % — ABNORMAL HIGH (ref 11.5–15.5)
WBC: 9.1 10*3/uL (ref 4.0–10.5)
nRBC: 0 % (ref 0.0–0.2)

## 2020-10-23 LAB — BASIC METABOLIC PANEL
Anion gap: 11 (ref 5–15)
BUN: 23 mg/dL — ABNORMAL HIGH (ref 6–20)
CO2: 26 mmol/L (ref 22–32)
Calcium: 8.5 mg/dL — ABNORMAL LOW (ref 8.9–10.3)
Chloride: 100 mmol/L (ref 98–111)
Creatinine, Ser: 1.34 mg/dL — ABNORMAL HIGH (ref 0.61–1.24)
GFR, Estimated: >60 mL/min (ref >60)
Glucose, Bld: 96 mg/dL (ref 70–99)
Potassium: 3.8 mmol/L (ref 3.5–5.1)
Sodium: 137 mmol/L (ref 135–145)

## 2020-10-23 LAB — MAGNESIUM: Magnesium: 2 mg/dL (ref 1.7–2.4)

## 2020-10-23 MED ORDER — POTASSIUM CHLORIDE IN NACL 20-0.9 MEQ/L-% IV SOLN
Freq: Once | INTRAVENOUS | Status: AC
  Filled 2020-10-23: qty 1000

## 2020-10-23 MED ORDER — MAGNESIUM SULFATE 2 GM/50ML IV SOLN
INTRAVENOUS | Status: AC
  Filled 2020-10-23: qty 50

## 2020-10-23 MED ORDER — SODIUM CHLORIDE 0.9 % IV SOLN
40.0000 mg/m2 | Freq: Once | INTRAVENOUS | Status: AC
  Administered 2020-10-23: 88 mg via INTRAVENOUS
  Filled 2020-10-23: qty 88

## 2020-10-23 MED ORDER — MAGNESIUM SULFATE 2 GM/50ML IV SOLN
2.0000 g | Freq: Once | INTRAVENOUS | Status: AC
  Administered 2020-10-23: 2 g via INTRAVENOUS

## 2020-10-23 MED ORDER — SODIUM CHLORIDE 0.9% FLUSH
10.0000 mL | INTRAVENOUS | Status: DC | PRN
  Administered 2020-10-23: 10 mL via INTRAVENOUS
  Filled 2020-10-23: qty 10

## 2020-10-23 MED ORDER — HEPARIN SOD (PORK) LOCK FLUSH 100 UNIT/ML IV SOLN
500.0000 [IU] | Freq: Once | INTRAVENOUS | Status: AC | PRN
  Administered 2020-10-23: 500 [IU]
  Filled 2020-10-23: qty 5

## 2020-10-23 MED ORDER — PALONOSETRON HCL INJECTION 0.25 MG/5ML
INTRAVENOUS | Status: AC
  Filled 2020-10-23: qty 5

## 2020-10-23 MED ORDER — OSMOLITE 1.5 CAL PO LIQD: ORAL | 6 refills | Status: DC

## 2020-10-23 MED ORDER — SODIUM CHLORIDE 0.9 % IV SOLN
150.0000 mg | Freq: Once | INTRAVENOUS | Status: AC
  Administered 2020-10-23: 150 mg via INTRAVENOUS
  Filled 2020-10-23: qty 150

## 2020-10-23 MED ORDER — PALONOSETRON HCL INJECTION 0.25 MG/5ML
0.2500 mg | Freq: Once | INTRAVENOUS | Status: AC
  Administered 2020-10-23: 0.25 mg via INTRAVENOUS

## 2020-10-23 MED ORDER — SODIUM CHLORIDE 0.9% FLUSH
10.0000 mL | INTRAVENOUS | Status: DC | PRN
Start: 2020-10-23 — End: 2020-10-23
  Administered 2020-10-23: 10 mL
  Filled 2020-10-23: qty 10

## 2020-10-23 MED ORDER — ONDANSETRON HCL 4 MG/2ML IJ SOLN: 8.0000 mg | Freq: Once | INTRAMUSCULAR | Status: DC

## 2020-10-23 MED ORDER — SODIUM CHLORIDE 0.9 % IV SOLN
10.0000 mg | Freq: Once | INTRAVENOUS | Status: AC
  Administered 2020-10-23: 10 mg via INTRAVENOUS
  Filled 2020-10-23: qty 10

## 2020-10-23 MED ORDER — SODIUM CHLORIDE 0.9 % IV SOLN
Freq: Once | INTRAVENOUS | Status: AC
  Filled 2020-10-23: qty 250

## 2020-10-23 MED FILL — Rivaroxaban Tab 20 MG: ORAL | 30 days supply | Qty: 30 | Fill #0 | Status: AC

## 2020-10-23 NOTE — Progress Notes (Signed)
Nutrition follow-up completed with patient during infusion for tonsil cancer.  Patient has completed 23 out of 35 radiation therapy treatments. Weight decreased and documented as 200.1 pounds on April 7 decreased from 213.6 pounds March 31. Patient reports his mouth is sore and he is having a decreased taste with decreased appetite.  He is not eating much at this point but still tries to drink water.  Patient reports some nausea. Noted labs: BUN 23 and creatinine 1.34. Patient is agreeable to beginning tube feeding.  Estimated nutrition needs: 2400-2600 cal, 120-140 g protein, 2.5 L fluid.  Nutrition diagnosis: Food and nutrition related knowledge deficit continues.  Intervention: Continue oral intake as tolerated.  Sip on water throughout the day. Begin taking nausea medication as prescribed by MD.  Ask for clarification today by RN after chemotherapy. Begin Osmolite 1.5 - 1 carton 4 times daily with 60 mL of free water before and after each bolus feeding.  Increase by 1 carton daily to goal rate of 7 cartons daily providing 2485 cal, 104.3 g protein and 1267 mL of free water.  In addition add Prosource TF 3 times daily between tube feedings to provide an additional 120 cal and 33 g protein total.  Patient will use 180 mL of free water before and after protein flushes.  Written instructions provided.  Patient was educated and he stated clear understanding.  All questions were answered and teach back method used.  Orders were written and sent to [facility].  7 cartons Osmolite 1.5+45 mL Prosource TF 3 times daily plus free water flushes provides 2605 cal, 137 g protein, 2827 mL free water.  This is 100% estimated nutrition needs.  Monitoring, evaluation, goals: Patient will tolerate tube feeding advancements with free water flushes to meet greater than 90% estimated nutrition needs.  Next visit: Thursday, April 14 during infusion.

## 2020-10-23 NOTE — Patient Instructions (Signed)
[facility] Discharge Instructions for Patients Receiving Chemotherapy  Today you received the following chemotherapy agents: Cisplatin  To help prevent nausea and vomiting after your treatment, we encourage you to take your nausea medication as directed.   If you develop nausea and vomiting that is not controlled by your nausea medication, call the clinic.   BELOW ARE SYMPTOMS THAT SHOULD BE REPORTED IMMEDIATELY:  *FEVER GREATER THAN 100.5 F  *CHILLS WITH OR WITHOUT FEVER  NAUSEA AND VOMITING THAT IS NOT CONTROLLED WITH YOUR NAUSEA MEDICATION  *UNUSUAL SHORTNESS OF BREATH  *UNUSUAL BRUISING OR BLEEDING  TENDERNESS IN MOUTH AND THROAT WITH OR WITHOUT PRESENCE OF ULCERS  *URINARY PROBLEMS  *BOWEL PROBLEMS  UNUSUAL RASH Items with * indicate a potential emergency and should be followed up as soon as possible.  Feel free to call the clinic should you have any questions or concerns. The clinic phone number is [phone].  Please show the [facility] at check-in to the Emergency Department and triage nurse.  Cisplatin injection What is this medicine? CISPLATIN (SIS pla tin) is a chemotherapy drug. It targets fast dividing cells, like cancer cells, and causes these cells to die. This medicine is used to treat many types of cancer like bladder, ovarian, and testicular cancers. This medicine may be used for other purposes; ask your health care provider or pharmacist if you have questions. COMMON BRAND NAME(S): Platinol, Platinol -AQ What should I tell my health care provider before I take this medicine? They need to know if you have any of these conditions:  eye disease, vision problems  hearing problems  kidney disease  low blood counts, like white cells, platelets, or red blood cells  tingling of the fingers or toes, or other nerve disorder  an unusual or allergic reaction to cisplatin, carboplatin, oxaliplatin, other medicines, foods, dyes, or  preservatives  pregnant or trying to get pregnant  breast-feeding How should I use this medicine? This drug is given as an infusion into a vein. It is administered in a hospital or clinic by a specially trained health care professional. Talk to your pediatrician regarding the use of this medicine in children. Special care may be needed. Overdosage: If you think you have taken too much of this medicine contact a poison control center or emergency room at once. NOTE: This medicine is only for you. Do not share this medicine with others. What if I miss a dose? It is important not to miss a dose. Call your doctor or health care professional if you are unable to keep an appointment. What may interact with this medicine? This medicine may interact with the following medications:  foscarnet  certain antibiotics like amikacin, gentamicin, neomycin, polymyxin B, streptomycin, tobramycin, vancomycin This list may not describe all possible interactions. Give your health care provider a list of all the medicines, herbs, non-prescription drugs, or dietary supplements you use. Also tell them if you smoke, drink alcohol, or use illegal drugs. Some items may interact with your medicine. What should I watch for while using this medicine? Your condition will be monitored carefully while you are receiving this medicine. You will need important blood work done while you are taking this medicine. This drug may make you feel generally unwell. This is not uncommon, as chemotherapy can affect healthy cells as well as cancer cells. Report any side effects. Continue your course of treatment even though you feel ill unless your doctor tells you to stop. This medicine may increase your  risk of getting an infection. Call your healthcare professional for advice if you get a fever, chills, or sore throat, or other symptoms of a cold or flu. Do not treat yourself. Try to avoid being around people who are sick. Avoid taking  medicines that contain aspirin, acetaminophen, ibuprofen, naproxen, or ketoprofen unless instructed by your healthcare professional. These medicines may hide a fever. This medicine may increase your risk to bruise or bleed. Call your doctor or health care professional if you notice any unusual bleeding. Be careful brushing and flossing your teeth or using a toothpick because you may get an infection or bleed more easily. If you have any dental work done, tell your dentist you are receiving this medicine. Do not become pregnant while taking this medicine or for 14 months after stopping it. Women should inform their healthcare professional if they wish to become pregnant or think they might be pregnant. Men should not father a child while taking this medicine and for 11 months after stopping it. There is potential for serious side effects to an unborn child. Talk to your healthcare professional for more information. Do not breast-feed an infant while taking this medicine. This medicine has caused ovarian failure in some women. This medicine may make it more difficult to get pregnant. Talk to your healthcare professional if you are concerned about your fertility. This medicine has caused decreased sperm counts in some men. This may make it more difficult to father a child. Talk to your healthcare professional if you are concerned about your fertility. Drink fluids as directed while you are taking this medicine. This will help protect your kidneys. Call your doctor or health care professional if you get diarrhea. Do not treat yourself. What side effects may I notice from receiving this medicine? Side effects that you should report to your doctor or health care professional as soon as possible:  allergic reactions like skin rash, itching or hives, swelling of the face, lips, or tongue  blurred vision  changes in vision  decreased hearing or ringing of the ears  nausea, vomiting  pain, redness, or  irritation at site where injected  pain, tingling, numbness in the hands or feet  signs and symptoms of bleeding such as bloody or black, tarry stools; red or dark brown urine; spitting up blood or brown material that looks like coffee grounds; red spots on the skin; unusual bruising or bleeding from the eyes, gums, or nose  signs and symptoms of infection like fever; chills; cough; sore throat; pain or trouble passing urine  signs and symptoms of kidney injury like trouble passing urine or change in the amount of urine  signs and symptoms of low red blood cells or anemia such as unusually weak or tired; feeling faint or lightheaded; falls; breathing problems Side effects that usually do not require medical attention (report to your doctor or health care professional if they continue or are bothersome):  loss of appetite  mouth sores  muscle cramps This list may not describe all possible side effects. Call your doctor for medical advice about side effects. You may report side effects to FDA at 1-800-FDA-1088. Where should I keep my medicine? This drug is given in a hospital or clinic and will not be stored at home. NOTE: This sheet is a summary. It may not cover all possible information. If you have questions about this medicine, talk to your doctor, pharmacist, or health care provider.  2021 Elsevier/Gold Standard (2018-06-30 15:59:17)

## 2020-10-23 NOTE — Assessment & Plan Note (Signed)
He had 2 episodes of PE, hence will continue indefinite anticoagulation at this time. Continue Xarelto, no bleeding symptoms. No concerns for recurrence of DVT/PE

## 2020-10-23 NOTE — Assessment & Plan Note (Signed)
He will let us know if he needs pain medication Continue using viscous lidocaine and biotin at this time.

## 2020-10-23 NOTE — Assessment & Plan Note (Signed)
I encouraged him to start using G tube. He can continue drinking boost by mouth Continue FU with dietitian.

## 2020-10-23 NOTE — Assessment & Plan Note (Signed)
SCC left tonsil, T3 N3 M0/ Stage III, P16 positive Repeat biopsy suggested poorly differentiated SCC. Given the above information and very locally advanced disease, we decided to proceed with concurrent chemoradiation with weekly cisplatin. C1D1 of cisplatin started on 10/09/2020 C2D1 of cisplatin today, 10/16/2020 C3D1 anticipated today PE today with stable neck mass. ROS today nausea, using PRN nausea medication, blisters in his mouth, would like to try what Dr Isidore Moos prescribed and weight loss. CBC satisfactory to proceed, rest of the labs are pending Reviewed all medications and he will start dexamethasone tomorrow. RTC in one week.

## 2020-10-23 NOTE — Progress Notes (Signed)
Mitchell Cooper NOTE  Patient Care Team: Ladell Pier, MD as PCP - General (Internal Medicine) Melida Quitter, MD as Consulting Physician (Otolaryngology) Eppie Gibson, MD as Consulting Physician (Radiation Oncology) Benay Pike, MD as Consulting Physician (Hematology and Oncology) Malmfelt, Stephani Police, RN as Oncology Nurse Navigator  CHIEF COMPLAINTS/PURPOSE OF CONSULTATION:  New diagnosis of left tonsillar cancer.  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 Given the above information and very locally advanced disease, we decided to proceed with concurrent chemoradiation with weekly cisplatin. C1D1 of cisplatin started on 10/09/2020 C2D1 of cisplatin today, 10/16/2020 C3D1 anticipated today PE today with stable neck mass. ROS today nausea, using PRN nausea medication, blisters in his mouth, would like to try what Dr Isidore Moos prescribed and weight loss. CBC satisfactory to proceed, rest of the labs are pending Reviewed all medications and he will start dexamethasone tomorrow. RTC in one week.  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 Continue Xarelto, no bleeding symptoms. No concerns for recurrence of DVT/PE  Weight loss, unintentional I encouraged him to start using G tube. He can continue drinking boost by mouth Continue FU with dietitian.  Mucositis due to antineoplastic therapy He will let us know if he needs pain medication Continue using viscous lidocaine and biotin at this time.  No orders of the defined types were placed in this encounter.  HISTORY OF PRESENTING ILLNESS:   Mitchell Cooper 60 y.o. male is here because of new diagnosis of tonsil cancer  Mitchell Cooper is a 60 y.o. male who presents as a consultation at the request of  Dr.Bates with new diagnosis of poorly differentiated carcinoma noted on tonsilar biopsy. He initially presented with a chief complaint of neck mass going on for more than one year, care delayed partly by the pandemic. His neck mass continued to progress in size. He otherwise denied any dysphagia, speech issues, otalgia or weight loss He had two episodes of PE in 2018 and 2021 and is on indefinite anticoagulation with xarelto. According to patient there was no clear etiology for these PE  Except for long periods of immobilization for work. He otherwise has HTN, well controlled on norvasc He is not a heavy smoker, smoked a pack in a week or so, quit smoking in 2018, resumed in 2021. He was a Architect worked by occupation.  Imaging and work up done so far.  07/23/2020  IMPRESSION: 1. 2.4 x 4.7 cm mass in the left oropharynx which appears to be arising from the soft palate extending inferiorly. Findings compatible with carcinoma. 2. Large necrotic lymph node mass in the left neck compatible with metastatic disease. Additional small ill-defined lymph nodes the left could be due to tumor as well. 3. Poor dentition.  08/27/2020  Final Pathologic Diagnosis    A.  LEFT TONSIL, BIOPSY:              Poorly differentiated malignant neoplasm.              See comment.   Electronically signed by Lowella Bandy, MD on 08/13/2020 at 12:10 PM  Comment    This biopsy show the presence of a poorly differentiated malignant neoplasm located at the subepithelial stroma.  Immunostains with appropriate controls show that tumor cells are positive for vimentin and p16, and negative for  pancytokeratin (AE1/3), low molecular weight cytokeratin, CK5/6, pan melanoma markers, SOX10, S100, desmin, CD34, CD30  and p40.  CD45, CD3 and CD20 are positive in some background lymphocytes and the tumor cells appear to be negative.  Ki67 is positive in about 40% of the tumor cells.  The findings are consistent with a poorly  differentiated malignant neoplasm of undetermined classification.  This tumor shows negative reaction for multiple keratin markers, but a very poorly differentiated carcinoma still can not be entirely ruled.  Because of the negative reaction for multiple markers of melanoma, a malignant melanoma is less likely.  Dr. Sherran Needs also reviewed this case, and believes that the findings in this biopsy are not diagnostic of lymphoma.  A malignant mesenchymal neoplasm is possible, but other types of malignant neoplasm can not be ruled out.  Since this is a relatively small biopsy, additional sampling of the lesion may probably provide additional findings to help further classification of this tumor.  Clinical correlation is recommended.    Dr. Tommas Olp reviewed this case and agrees with this diagnosis.    08/27/2020  IMPRESSION: 1. Left tonsillar mass is hypermetabolic and consistent with neoplasm. 2. Bulky necrotic left metastatic nodal disease. 3. No findings for metastatic disease involving the chest, abdomen/pelvis or bony structures.  Interval history  He is here for C3 of cisplatin chemotherapy. He is noticing some blisters in his mouth, he says Dr Isidore Moos prescribed him something. He has lost about 13 lbs in 2 weeks. He is reluctant to use the tube, was going to try some boost a couple times daily and use G tube if he still has difficulty maintaining weight. Neck lymphadenopathy is smaller. He denies any changes in breathing, bowel habits or urinary habits No new neurological complaints. No neuropathy, hearing changes or change in urinary habits. Rest of the pertinent 10 point ROS reviewed and negative.  MEDICAL HISTORY:  Past Medical History:  Diagnosis Date  . Cancer ([place])    Left Tonsillar Cancer   . DVT (deep venous thrombosis) ([place])   . Hypertension 1997   has never taken medications  . Tobacco dependence     SURGICAL HISTORY: Past Surgical History:  Procedure Laterality Date  .  IR GASTROSTOMY TUBE MOD SED  09/11/2020  . IR IMAGING GUIDED PORT INSERTION  09/11/2020  . KNEE SURGERY  approx. 1984   reconstructive surgery of left knee; arthroscopic procedure about 1997  . MULTIPLE EXTRACTIONS WITH ALVEOLOPLASTY N/A 09/25/2020   Procedure: MULTIPLE EXTRACTIONS WITH ALVEOLOPLASTY;  Surgeon: Charlaine Dalton, DMD;  Location: [facility];  Service: Dentistry;  Laterality: N/A;  . PERIPHERAL VASCULAR CATHETERIZATION Left 06/16/2016   Procedure: Lower Extremity Venography- Venus Thrombolysis;  Surgeon: Serafina Mitchell, MD;  Location: [facility];  Service: Cardiovascular;  Laterality: Left;    SOCIAL HISTORY: Social History   Socioeconomic History  . Marital status: Married    Spouse name: Not on file  . Number of children: Not on file  . Years of education: Not on file  . Highest education level: Not on file  Occupational History  . Occupation: Architect  Tobacco Use  . Smoking status: Current Some Day Smoker    Types: Cigarettes    Last attempt to quit: 11/21/2016    Years since quitting: 3.9  . Smokeless tobacco: Never Used  Vaping Use  . Vaping Use: Never used  Substance and Sexual Activity  . Alcohol use: Yes    Alcohol/week: 6.0 standard drinks    Types:  6 Cans of beer per week  . Drug use: No  . Sexual activity: Not Currently  Other Topics Concern  . Not on file  Social History Narrative  . Not on file   Social Determinants of Health   Financial Resource Strain: Low Risk   . Difficulty of Paying Living Expenses: Not very hard  Food Insecurity: No Food Insecurity  . Worried About Charity fundraiser in the Last Year: Never true  . Ran Out of Food in the Last Year: Never true  Transportation Needs: No Transportation Needs  . Lack of Transportation (Medical): No  . Lack of Transportation (Non-Medical): No  Physical Activity: Inactive  . Days of Exercise per Week: 0 days  . Minutes of Exercise per Session: 0 min  Stress: No Stress Concern  Present  . Feeling of Stress : Only a little  Social Connections: Socially Integrated  . Frequency of Communication with Friends and Family: More than three times a week  . Frequency of Social Gatherings with Friends and Family: More than three times a week  . Attends Religious Services: More than 4 times per year  . Active Member of Clubs or Organizations: Yes  . Attends Archivist Meetings: More than 4 times per year  . Marital Status: Married  Human resources officer Violence: Not on file    FAMILY HISTORY: Family History  Problem Relation Age of Onset  . CAD Mother 13    ALLERGIES:  has No Known Allergies.  MEDICATIONS:  Current Outpatient Medications  Medication Sig Dispense Refill  . fluconazole (DIFLUCAN) 100 MG tablet Take 2 tablets today, then 1 tablet daily x 20 more days. 22 tablet 0  . amLODipine (NORVASC) 10 MG tablet TAKE 1 TABLET (10 MG TOTAL) BY MOUTH DAILY. 30 tablet 6  . dexamethasone (DECADRON) 4 MG tablet Take 2 tablets (8 mg total) by mouth daily. Take daily x 3 days starting the day after cisplatin chemotherapy. Take with food. (Patient not taking: Reported on 10/02/2020) 30 tablet 1  . lidocaine (XYLOCAINE) 2 % solution Patient: Mix 1part 2% viscous lidocaine, 1part H20. Swish & swallow 64m of diluted mixture, 230m before meals and at bedtime, up to QID 200 mL 4  . lidocaine-prilocaine (EMLA) cream Apply to affected area once 30 g 3  . ondansetron (ZOFRAN) 8 MG tablet Take 1 tablet (8 mg total) by mouth 2 (two) times daily as needed. Start on the third day after cisplatin chemotherapy. 30 tablet 1  . prochlorperazine (COMPAZINE) 10 MG tablet Take 1 tablet (10 mg total) by mouth every 6 (six) hours as needed (Nausea or vomiting). 30 tablet 1  . rivaroxaban (XARELTO) 20 MG TABS tablet TAKE 1 TABLET (20 MG TOTAL) BY MOUTH DAILY WITH SUPPER. 30 tablet 6   Current Facility-Administered Medications  Medication Dose Route Frequency Provider Last Rate Last Admin   . ondansetron (ZOFRAN) injection 8 mg  8 mg Intravenous Once IrBenay PikeMD       Facility-Administered Medications Ordered in Other Visits  Medication Dose Route Frequency Provider Last Rate Last Admin  . 0.9 % NaCl with KCl 20 mEq/ L  infusion   Intravenous Once Emerald Gehres, MD      . heparin lock flush 100 unit/mL  500 Units Intracatheter Once PRN Melana Hingle, MD      . magnesium sulfate IVPB 2 g 50 mL  2 g Intravenous Once Luke Rigsbee, MD      . sodium chloride flush (NS) 0.9 %  injection 10 mL  10 mL Intracatheter PRN Benay Pike, MD         PHYSICAL EXAMINATION: ECOG PERFORMANCE STATUS: 0 - Asymptomatic  Vitals:   10/23/20 0910  BP: (!) 115/92  Pulse: 94  Resp: 18  Temp: (!) 97 F (36.1 C)  SpO2: 98%   Filed Weights   10/23/20 0910  Weight: 200 lb 1.6 oz (90.8 kg)    Physical Exam Constitutional:      Appearance: Normal appearance.  HENT:     Head: Normocephalic and atraumatic.     Mouth/Throat:     Mouth: Mucous membranes are moist.     Pharynx: No oropharyngeal exudate or posterior oropharyngeal erythema.  Cardiovascular:     Rate and Rhythm: Normal rate and regular rhythm.     Pulses: Normal pulses.     Heart sounds: Normal heart sounds.  Pulmonary:     Effort: Pulmonary effort is normal.     Breath sounds: Normal breath sounds.  Abdominal:     General: Abdomen is flat.     Palpations: Abdomen is soft.  Musculoskeletal:        General: No swelling or tenderness.     Cervical back: No rigidity or tenderness.  Lymphadenopathy:     Cervical: Cervical adenopathy (left sided cervical lymphadenopathy measuring about 4 cms on todays visit, no major changes since last visit) present.  Neurological:     General: No focal deficit present.     Mental Status: He is alert.  Psychiatric:        Mood and Affect: Mood normal.      LABORATORY DATA:  I have reviewed the data as listed Lab Results  Component Value Date   WBC 9.1 10/23/2020    HGB 13.7 10/23/2020   HCT 40.1 10/23/2020   MCV 78.2 (L) 10/23/2020   PLT 149 (L) 10/23/2020     Chemistry      Component Value Date/Time   NA 137 10/23/2020 0902   NA 139 12/10/2019 1348   K 3.8 10/23/2020 0902   CL 100 10/23/2020 0902   CO2 26 10/23/2020 0902   BUN 23 (H) 10/23/2020 0902   BUN 17 12/10/2019 1348   CREATININE 1.34 (H) 10/23/2020 0902   CREATININE 1.17 09/03/2020 1534      Component Value Date/Time   CALCIUM 8.5 (L) 10/23/2020 0902   ALKPHOS 72 09/03/2020 1534   AST 12 (L) 09/03/2020 1534   ALT 12 09/03/2020 1534   BILITOT 0.4 09/03/2020 1534      RADIOGRAPHIC STUDIES: I have personally reviewed the radiological images as listed and agreed with the findings in the report. No results found. I independently reviewed PET imaging from outside facility  09/23/2020   Final Pathologic Diagnosis     "TONSIL", BIOPSY: Carcinoma, favor poorly differentiated squamous cell carcinoma. See comment.    Comment    Immunohistochemical stains are performed to characterize the tumor. The tumor cells are positive for EMA (patchy) and p63 (focal); negative for SMA and ERG. These findings, along with the morphologic features and prior immunohistochemical studies (case [id number]), support the diagnosis of carcinoma. A poorly differentiated, p16 positive squamous cell carcinoma is favored. The control(s) worked appropriately.   Labs from today reviewed, ok to proceed with treatment.  All questions were answered. The patient knows to call the clinic with any problems, questions or concerns. I spent over 30 minutes in the care of this patient including H and P, review of records, counseling and coordination  of care.     Benay Pike, MD 10/23/2020 9:53 AM

## 2020-10-23 NOTE — Patient Instructions (Signed)
Implanted Port Insertion, Care After This sheet gives you information about how to care for yourself after your procedure. Your health care provider may also give you more specific instructions. If you have problems or questions, contact your health care provider. What can I expect after the procedure? After the procedure, it is common to have:  Discomfort at the port insertion site.  Bruising on the skin over the port. This should improve over 3-4 days. Follow these instructions at home: Port care  After your port is placed, you will get a manufacturer's information card. The card has information about your port. Keep this card with you at all times.  Take care of the port as told by your health care provider. Ask your health care provider if you or a family member can get training for taking care of the port at home. A home health care nurse may also take care of the port.  Make sure to remember what type of port you have. Incision care  Follow instructions from your health care provider about how to take care of your port insertion site. Make sure you: ? Wash your hands with soap and water before and after you change your bandage (dressing). If soap and water are not available, use hand sanitizer. ? Change your dressing as told by your health care provider. ? Leave stitches (sutures), skin glue, or adhesive strips in place. These skin closures may need to stay in place for 2 weeks or longer. If adhesive strip edges start to loosen and curl up, you may trim the loose edges. Do not remove adhesive strips completely unless your health care provider tells you to do that.  Check your port insertion site every day for signs of infection. Check for: ? Redness, swelling, or pain. ? Fluid or blood. ? Warmth. ? Pus or a bad smell.      Activity  Return to your normal activities as told by your health care provider. Ask your health care provider what activities are safe for you.  Do not  lift anything that is heavier than 10 lb (4.5 kg), or the limit that you are told, until your health care provider says that it is safe. General instructions  Take over-the-counter and prescription medicines only as told by your health care provider.  Do not take baths, swim, or use a hot tub until your health care provider approves. Ask your health care provider if you may take showers. You may only be allowed to take sponge baths.  Do not drive for 24 hours if you were given a sedative during your procedure.  Wear a medical alert bracelet in case of an emergency. This will tell any health care providers that you have a port.  Keep all follow-up visits as told by your health care provider. This is important. Contact a health care provider if:  You cannot flush your port with saline as directed, or you cannot draw blood from the port.  You have a fever or chills.  You have redness, swelling, or pain around your port insertion site.  You have fluid or blood coming from your port insertion site.  Your port insertion site feels warm to the touch.  You have pus or a bad smell coming from the port insertion site. Get help right away if:  You have chest pain or shortness of breath.  You have bleeding from your port that you cannot control. Summary  Take care of the port as told by your   health care provider. Keep the manufacturer's information card with you at all times.  Change your dressing as told by your health care provider.  Contact a health care provider if you have a fever or chills or if you have redness, swelling, or pain around your port insertion site.  Keep all follow-up visits as told by your health care provider. This information is not intended to replace advice given to you by your health care provider. Make sure you discuss any questions you have with your health care provider. Document Revised: 01/31/2018 Document Reviewed: 01/31/2018 Elsevier Patient Education   2021 Elsevier Inc.  

## 2020-10-24 ENCOUNTER — Ambulatory Visit
Admission: RE | Admit: 2020-10-24 | Discharge: 2020-10-24 | Disposition: A | Discharge status: Home / Self Care | Payer: Medicaid Other | Source: Ambulatory Visit | Attending: Radiation Oncology | Admitting: Radiation Oncology

## 2020-10-24 ENCOUNTER — Other Ambulatory Visit: Payer: Self-pay

## 2020-10-24 DIAGNOSIS — Z510 Encounter for antineoplastic radiation therapy: Secondary | ICD-10-CM | POA: Diagnosis not present

## 2020-10-24 DIAGNOSIS — C090 Malignant neoplasm of tonsillar fossa: Secondary | ICD-10-CM | POA: Diagnosis not present

## 2020-10-27 ENCOUNTER — Other Ambulatory Visit: Payer: Self-pay

## 2020-10-27 ENCOUNTER — Ambulatory Visit
Admission: RE | Admit: 2020-10-27 | Discharge: 2020-10-27 | Disposition: A | Discharge status: Home / Self Care | Payer: Medicaid Other | Source: Ambulatory Visit | Attending: Radiation Oncology | Admitting: Radiation Oncology

## 2020-10-27 DIAGNOSIS — Z510 Encounter for antineoplastic radiation therapy: Secondary | ICD-10-CM | POA: Diagnosis not present

## 2020-10-27 DIAGNOSIS — C090 Malignant neoplasm of tonsillar fossa: Secondary | ICD-10-CM | POA: Diagnosis not present

## 2020-10-28 ENCOUNTER — Ambulatory Visit
Admission: RE | Admit: 2020-10-28 | Discharge: 2020-10-28 | Disposition: A | Discharge status: Home / Self Care | Payer: Medicaid Other | Source: Ambulatory Visit | Attending: Radiation Oncology | Admitting: Radiation Oncology

## 2020-10-28 DIAGNOSIS — C090 Malignant neoplasm of tonsillar fossa: Secondary | ICD-10-CM | POA: Diagnosis not present

## 2020-10-28 DIAGNOSIS — Z510 Encounter for antineoplastic radiation therapy: Secondary | ICD-10-CM | POA: Diagnosis not present

## 2020-10-29 ENCOUNTER — Ambulatory Visit: Payer: Medicaid Other | Admitting: Hematology and Oncology

## 2020-10-29 ENCOUNTER — Ambulatory Visit: Payer: Medicaid Other

## 2020-10-29 ENCOUNTER — Other Ambulatory Visit: Payer: Medicaid Other

## 2020-10-29 ENCOUNTER — Other Ambulatory Visit: Payer: Self-pay

## 2020-10-29 ENCOUNTER — Ambulatory Visit
Admission: RE | Admit: 2020-10-29 | Discharge: 2020-10-29 | Disposition: A | Discharge status: Home / Self Care | Payer: Medicaid Other | Source: Ambulatory Visit | Attending: Radiation Oncology | Admitting: Radiation Oncology

## 2020-10-29 DIAGNOSIS — Z510 Encounter for antineoplastic radiation therapy: Secondary | ICD-10-CM | POA: Diagnosis not present

## 2020-10-29 DIAGNOSIS — C090 Malignant neoplasm of tonsillar fossa: Secondary | ICD-10-CM | POA: Diagnosis not present

## 2020-10-30 ENCOUNTER — Inpatient Hospital Stay (HOSPITAL_BASED_OUTPATIENT_CLINIC_OR_DEPARTMENT_OTHER): Payer: Medicaid Other | Admitting: Hematology and Oncology

## 2020-10-30 ENCOUNTER — Encounter: Payer: Self-pay | Admitting: General Practice

## 2020-10-30 ENCOUNTER — Other Ambulatory Visit (HOSPITAL_COMMUNITY): Payer: Self-pay

## 2020-10-30 ENCOUNTER — Inpatient Hospital Stay: Payer: Medicaid Other

## 2020-10-30 ENCOUNTER — Ambulatory Visit
Admission: RE | Admit: 2020-10-30 | Discharge: 2020-10-30 | Disposition: A | Discharge status: Home / Self Care | Payer: Medicaid Other | Source: Ambulatory Visit | Attending: Radiation Oncology | Admitting: Radiation Oncology

## 2020-10-30 ENCOUNTER — Encounter: Payer: Self-pay | Admitting: Hematology and Oncology

## 2020-10-30 ENCOUNTER — Inpatient Hospital Stay: Payer: Medicaid Other | Admitting: Nutrition

## 2020-10-30 DIAGNOSIS — C099 Malignant neoplasm of tonsil, unspecified: Secondary | ICD-10-CM

## 2020-10-30 DIAGNOSIS — C090 Malignant neoplasm of tonsillar fossa: Secondary | ICD-10-CM | POA: Diagnosis not present

## 2020-10-30 DIAGNOSIS — R634 Abnormal weight loss: Secondary | ICD-10-CM | POA: Diagnosis not present

## 2020-10-30 DIAGNOSIS — K1231 Oral mucositis (ulcerative) due to antineoplastic therapy: Secondary | ICD-10-CM | POA: Diagnosis not present

## 2020-10-30 DIAGNOSIS — Z510 Encounter for antineoplastic radiation therapy: Secondary | ICD-10-CM | POA: Diagnosis not present

## 2020-10-30 LAB — CBC WITH DIFFERENTIAL/PLATELET
Abs Immature Granulocytes: 0.01 10*3/uL (ref 0.00–0.07)
Basophils Absolute: 0 10*3/uL (ref 0.0–0.1)
Basophils Relative: 0 %
Eosinophils Absolute: 0.1 10*3/uL (ref 0.0–0.5)
Eosinophils Relative: 2 %
HCT: 37.4 % — ABNORMAL LOW (ref 39.0–52.0)
Hemoglobin: 12.7 g/dL — ABNORMAL LOW (ref 13.0–17.0)
Immature Granulocytes: 0 %
Lymphocytes Relative: 8 %
Lymphs Abs: 0.3 10*3/uL — ABNORMAL LOW (ref 0.7–4.0)
MCH: 26.6 pg (ref 26.0–34.0)
MCHC: 34 g/dL (ref 30.0–36.0)
MCV: 78.4 fL — ABNORMAL LOW (ref 80.0–100.0)
Monocytes Absolute: 0.7 10*3/uL (ref 0.1–1.0)
Monocytes Relative: 15 %
Neutro Abs: 3.3 10*3/uL (ref 1.7–7.7)
Neutrophils Relative %: 75 %
Platelets: 125 10*3/uL — ABNORMAL LOW (ref 150–400)
RBC: 4.77 MIL/uL (ref 4.22–5.81)
RDW: 15.5 % (ref 11.5–15.5)
WBC: 4.4 10*3/uL (ref 4.0–10.5)
nRBC: 0 % (ref 0.0–0.2)

## 2020-10-30 LAB — BASIC METABOLIC PANEL
Anion gap: 10 (ref 5–15)
BUN: 32 mg/dL — ABNORMAL HIGH (ref 6–20)
CO2: 27 mmol/L (ref 22–32)
Calcium: 9.3 mg/dL (ref 8.9–10.3)
Chloride: 101 mmol/L (ref 98–111)
Creatinine, Ser: 1.42 mg/dL — ABNORMAL HIGH (ref 0.61–1.24)
GFR, Estimated: 57 mL/min — ABNORMAL LOW (ref >60)
Glucose, Bld: 97 mg/dL (ref 70–99)
Potassium: 4.4 mmol/L (ref 3.5–5.1)
Sodium: 138 mmol/L (ref 135–145)

## 2020-10-30 LAB — MAGNESIUM: Magnesium: 1.9 mg/dL (ref 1.7–2.4)

## 2020-10-30 MED ORDER — SODIUM CHLORIDE 0.9% FLUSH
10.0000 mL | INTRAVENOUS | Status: DC | PRN
  Administered 2020-10-30: 10 mL
  Filled 2020-10-30: qty 10

## 2020-10-30 MED ORDER — SODIUM CHLORIDE 0.9 % IV SOLN
10.0000 mg | Freq: Once | INTRAVENOUS | Status: AC
  Administered 2020-10-30: 10 mg via INTRAVENOUS
  Filled 2020-10-30: qty 10

## 2020-10-30 MED ORDER — FOSAPREPITANT DIMEGLUMINE INJECTION 150 MG
150.0000 mg | Freq: Once | INTRAVENOUS | Status: AC
  Administered 2020-10-30: 150 mg via INTRAVENOUS
  Filled 2020-10-30: qty 150

## 2020-10-30 MED ORDER — POTASSIUM CHLORIDE IN NACL 20-0.9 MEQ/L-% IV SOLN
Freq: Once | INTRAVENOUS | Status: AC
Start: 2020-10-30 — End: 2020-10-30
  Filled 2020-10-30: qty 1000

## 2020-10-30 MED ORDER — MAGNESIUM SULFATE 2 GM/50ML IV SOLN
2.0000 g | Freq: Once | INTRAVENOUS | Status: AC
  Administered 2020-10-30: 2 g via INTRAVENOUS

## 2020-10-30 MED ORDER — PALONOSETRON HCL INJECTION 0.25 MG/5ML
INTRAVENOUS | Status: AC
  Filled 2020-10-30: qty 5

## 2020-10-30 MED ORDER — SODIUM CHLORIDE 0.9 % IV SOLN
30.0000 mg/m2 | Freq: Once | INTRAVENOUS | Status: AC
  Administered 2020-10-30: 66 mg via INTRAVENOUS
  Filled 2020-10-30: qty 66

## 2020-10-30 MED ORDER — MAGNESIUM SULFATE 2 GM/50ML IV SOLN
INTRAVENOUS | Status: AC
  Filled 2020-10-30: qty 50

## 2020-10-30 MED ORDER — SODIUM CHLORIDE 0.9 % IV SOLN
Freq: Once | INTRAVENOUS | Status: AC
  Filled 2020-10-30: qty 250

## 2020-10-30 MED ORDER — HYDROCODONE-ACETAMINOPHEN 7.5-325 MG/15ML PO SOLN
10.0000 mL | Freq: Four times a day (QID) | ORAL | 0 refills | Status: DC | PRN
  Filled 2020-10-30: qty 120, 3d supply, fill #0

## 2020-10-30 MED ORDER — HEPARIN SOD (PORK) LOCK FLUSH 100 UNIT/ML IV SOLN
500.0000 [IU] | Freq: Once | INTRAVENOUS | Status: AC | PRN
  Administered 2020-10-30: 500 [IU]
  Filled 2020-10-30: qty 5

## 2020-10-30 MED ORDER — PALONOSETRON HCL INJECTION 0.25 MG/5ML
0.2500 mg | Freq: Once | INTRAVENOUS | Status: AC
Start: 2020-10-30 — End: 2020-10-30
  Administered 2020-10-30: 0.25 mg via INTRAVENOUS

## 2020-10-30 NOTE — Patient Instructions (Signed)

## 2020-10-30 NOTE — Progress Notes (Signed)
Nutrition follow-up completed with patient during infusion for tonsil cancer.  Weight was documented as 197.4 pounds today down slightly from 200.1 pounds April 7. Noted labs: BUN 32 and creatinine 1.42. Patient is not eating food at this time.  He reports he can swallow water.  He is drinking 7 cartons of Osmolite 1.5 daily instead of using his feeding tube.  He reports no problem with tolerance or taste. Notes that he needs to increase his water intake. Reports 1 episode of diarrhea and a little bit of nausea however that has not impacted his intake.  Estimated nutrition needs: 2400-2600 cal, 120-140 g protein, 2.5 L fluid.  Nutrition diagnosis: Food and nutrition related knowledge deficit improved.  Intervention: Patient to continue 7 cartons Osmolite 1.5 daily either by mouth or through feeding tube as tolerated. Encourage patient to increase free water flushes as well as water by mouth to provide approximately 2837 mL free water total.  This would include free water available through Osmolite 1.5. Begin Prosource 3 times daily once patient receives delivery.  Reviewed strategies for administration.  7 cartons Osmolite 1.5+45 mL Prosource TF 3 times daily plus free water flushes provides 2605 cal, 137 g protein, 2827 mL free water/100% estimated nutrition needs.  Monitoring, evaluation, goals: Patient will tolerate adequate calories and protein to minimize weight loss.  Next visit: Thursday, April 21 during infusion.  **Disclaimer: This note was dictated with voice recognition software. Similar sounding words can inadvertently be transcribed and this note may contain transcription errors which may not have been corrected upon publication of note.**

## 2020-10-30 NOTE — Assessment & Plan Note (Signed)
SCC left tonsil, T3 N3 M0/ Stage III, P16 positive Repeat biopsy suggested poorly differentiated SCC. Given the above information and very locally advanced disease, we decided to proceed with concurrent chemoradiation with weekly cisplatin. C1D1 of cisplatin started on 10/09/2020 C2D1 of cisplatin today, 10/16/2020 C3D1 completed 10/23/2020 C4D1 anticipated today ROS today nausea, using PRN nausea medication, blisters in his mouth. PE mucositis noted, Left neck mass smaller and softer, more mobile to palpation. Labs today with drop in GFR, adjusted cisplatin dose to 30 mg/m2. Reviewed all medications and he will start dexamethasone tomorrow. RTC in one week.

## 2020-10-30 NOTE — Patient Instructions (Signed)
[facility] Discharge Instructions for Patients Receiving Chemotherapy  Today you received the following chemotherapy agents: Cisplatin  To help prevent nausea and vomiting after your treatment, we encourage you to take your nausea medication  as prescribed.    If you develop nausea and vomiting that is not controlled by your nausea medication, call the clinic.   BELOW ARE SYMPTOMS THAT SHOULD BE REPORTED IMMEDIATELY:  *FEVER GREATER THAN 100.5 F  *CHILLS WITH OR WITHOUT FEVER  NAUSEA AND VOMITING THAT IS NOT CONTROLLED WITH YOUR NAUSEA MEDICATION  *UNUSUAL SHORTNESS OF BREATH  *UNUSUAL BRUISING OR BLEEDING  TENDERNESS IN MOUTH AND THROAT WITH OR WITHOUT PRESENCE OF ULCERS  *URINARY PROBLEMS  *BOWEL PROBLEMS  UNUSUAL RASH Items with * indicate a potential emergency and should be followed up as soon as possible.  Feel free to call the clinic should you have any questions or concerns. The clinic phone number is [phone].  Please show the CHEMO ALERT CARD at check-in to the Emergency Department and triage nurse.   

## 2020-10-30 NOTE — Progress Notes (Signed)
Mitchell Cooper NOTE  Patient Care Team: Ladell Pier, MD as PCP - General (Internal Medicine) Melida Quitter, MD as Consulting Physician (Otolaryngology) Eppie Gibson, MD as Consulting Physician (Radiation Oncology) Benay Pike, MD as Consulting Physician (Hematology and Oncology) Malmfelt, Stephani Police, RN as Oncology Nurse Navigator  CHIEF COMPLAINTS/PURPOSE OF CONSULTATION:  New diagnosis of left tonsillar cancer.  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 Given the above information and very locally advanced disease, we decided to proceed with concurrent chemoradiation with weekly cisplatin. C1D1 of cisplatin started on 10/09/2020 C2D1 of cisplatin today, 10/16/2020 C3D1 completed 10/23/2020 C4D1 anticipated today ROS today nausea, using PRN nausea medication, blisters in his mouth. PE mucositis noted, Left neck mass smaller and softer, more mobile to palpation. Labs today with drop in GFR, adjusted cisplatin dose to 30 mg/m2. Reviewed all medications and he will start dexamethasone tomorrow. RTC in one week.  Mucositis due to antineoplastic therapy Will try liquid hydrocodone in addition to lidocaine. He has noted some difference with lidocaine but its still very bothersome.  No orders of the defined types were placed in this encounter.  HISTORY OF PRESENTING ILLNESS:   Mitchell Cooper 60 y.o. male is here because of new diagnosis of tonsil cancer  Mitchell Cooper is a 60 y.o. male who presents as a consultation at the request of Dr.Bates with new diagnosis of poorly differentiated carcinoma noted on tonsilar biopsy. He initially presented with a chief complaint of neck mass going on for more than one year, care delayed partly by the pandemic. His neck mass continued to progress in size. He otherwise denied any dysphagia,  speech issues, otalgia or weight loss He had two episodes of PE in 2018 and 2021 and is on indefinite anticoagulation with xarelto. According to patient there was no clear etiology for these PE  Except for long periods of immobilization for work. He otherwise has HTN, well controlled on norvasc He is not a heavy smoker, smoked a pack in a week or so, quit smoking in 2018, resumed in 2021. He was a Architect worked by occupation.  Imaging and work up done so far.  07/23/2020  IMPRESSION: 1. 2.4 x 4.7 cm mass in the left oropharynx which appears to be arising from the soft palate extending inferiorly. Findings compatible with carcinoma. 2. Large necrotic lymph node mass in the left neck compatible with metastatic disease. Additional small ill-defined lymph nodes the left could be due to tumor as well. 3. Poor dentition.  08/27/2020  Final Pathologic Diagnosis    A.  LEFT TONSIL, BIOPSY:              Poorly differentiated malignant neoplasm.              See comment.   Electronically signed by Lowella Bandy, MD on 08/13/2020 at 12:10 PM  Comment    This biopsy show the presence of a poorly differentiated malignant neoplasm located at the subepithelial stroma.  Immunostains with appropriate controls show that tumor cells are positive for vimentin and p16, and negative for pancytokeratin (AE1/3), low molecular weight cytokeratin, CK5/6, pan melanoma markers, SOX10, S100, desmin, CD34, CD30  and p40.  CD45, CD3 and CD20 are positive in some background lymphocytes and the tumor cells appear to be negative.  Ki67 is positive in about 40% of the tumor cells.  The findings  are consistent with a poorly differentiated malignant neoplasm of undetermined classification.  This tumor shows negative reaction for multiple keratin markers, but a very poorly differentiated carcinoma still can not be entirely ruled.  Because of the negative reaction for multiple markers of melanoma, a malignant melanoma is less  likely.  Dr. Sherran Needs also reviewed this case, and believes that the findings in this biopsy are not diagnostic of lymphoma.  A malignant mesenchymal neoplasm is possible, but other types of malignant neoplasm can not be ruled out.  Since this is a relatively small biopsy, additional sampling of the lesion may probably provide additional findings to help further classification of this tumor.  Clinical correlation is recommended.    Dr. Tommas Olp reviewed this case and agrees with this diagnosis.    08/27/2020  IMPRESSION: 1. Left tonsillar mass is hypermetabolic and consistent with neoplasm. 2. Bulky necrotic left metastatic nodal disease. 3. No findings for metastatic disease involving the chest, abdomen/pelvis or bony structures.  Interval history  He is here for C4 of cisplatin chemotherapy. He complains of mucositis that has been bothersome. He is using fluconazole, viscous lidocaine but wondering there is additional treatment for this pain. Neck lymphadenopathy is smaller. He denies any changes in breathing, bowel habits or urinary habits No new neurological complaints. No neuropathy, hearing changes or change in urinary habits. Rest of the pertinent 10 point ROS reviewed and negative.  MEDICAL HISTORY:  Past Medical History:  Diagnosis Date  . Cancer ([place])    Left Tonsillar Cancer   . DVT (deep venous thrombosis) ([place])   . Hypertension 1997   has never taken medications  . Tobacco dependence     SURGICAL HISTORY: Past Surgical History:  Procedure Laterality Date  . IR GASTROSTOMY TUBE MOD SED  09/11/2020  . IR IMAGING GUIDED PORT INSERTION  09/11/2020  . KNEE SURGERY  approx. 1984   reconstructive surgery of left knee; arthroscopic procedure about 1997  . MULTIPLE EXTRACTIONS WITH ALVEOLOPLASTY N/A 09/25/2020   Procedure: MULTIPLE EXTRACTIONS WITH ALVEOLOPLASTY;  Surgeon: Charlaine Dalton, DMD;  Location: [facility];  Service: Dentistry;  Laterality: N/A;  . PERIPHERAL VASCULAR  CATHETERIZATION Left 06/16/2016   Procedure: Lower Extremity Venography- Venus Thrombolysis;  Surgeon: Serafina Mitchell, MD;  Location: [facility];  Service: Cardiovascular;  Laterality: Left;    SOCIAL HISTORY: Social History   Socioeconomic History  . Marital status: Married    Spouse name: Not on file  . Number of children: Not on file  . Years of education: Not on file  . Highest education level: Not on file  Occupational History  . Occupation: Architect  Tobacco Use  . Smoking status: Current Some Day Smoker    Types: Cigarettes    Last attempt to quit: 11/21/2016    Years since quitting: 3.9  . Smokeless tobacco: Never Used  Vaping Use  . Vaping Use: Never used  Substance and Sexual Activity  . Alcohol use: Yes    Alcohol/week: 6.0 standard drinks    Types: 6 Cans of beer per week  . Drug use: No  . Sexual activity: Not Currently  Other Topics Concern  . Not on file  Social History Narrative  . Not on file   Social Determinants of Health   Financial Resource Strain: Low Risk   . Difficulty of Paying Living Expenses: Not very hard  Food Insecurity: No Food Insecurity  . Worried About Charity fundraiser in the Last Year: Never true  .  Ran Out of Food in the Last Year: Never true  Transportation Needs: No Transportation Needs  . Lack of Transportation (Medical): No  . Lack of Transportation (Non-Medical): No  Physical Activity: Inactive  . Days of Exercise per Week: 0 days  . Minutes of Exercise per Session: 0 min  Stress: No Stress Concern Present  . Feeling of Stress : Only a little  Social Connections: Socially Integrated  . Frequency of Communication with Friends and Family: More than three times a week  . Frequency of Social Gatherings with Friends and Family: More than three times a week  . Attends Religious Services: More than 4 times per year  . Active Member of Clubs or Organizations: Yes  . Attends Archivist Meetings: More than 4  times per year  . Marital Status: Married  Human resources officer Violence: Not on file    FAMILY HISTORY: Family History  Problem Relation Age of Onset  . CAD Mother 49    ALLERGIES:  has No Known Allergies.  MEDICATIONS:  Current Outpatient Medications  Medication Sig Dispense Refill  . amLODipine (NORVASC) 10 MG tablet TAKE 1 TABLET (10 MG TOTAL) BY MOUTH DAILY. 30 tablet 6  . dexamethasone (DECADRON) 4 MG tablet Take 2 tablets (8 mg total) by mouth daily. Take daily x 3 days starting the day after cisplatin chemotherapy. Take with food. 30 tablet 1  . HYDROcodone-acetaminophen (HYCET) 7.5-325 mg/15 ml solution Take 10 mLs by mouth every 6 (six) hours as needed for moderate pain. 120 mL 0  . lidocaine (XYLOCAINE) 2 % solution Patient: Mix 1part 2% viscous lidocaine, 1part H20. Swish & swallow 76m of diluted mixture, 240m before meals and at bedtime, up to QID 200 mL 4  . lidocaine-prilocaine (EMLA) cream Apply to affected area once 30 g 3  . Nutritional Supplements (FEEDING SUPPLEMENT, OSMOLITE 1.5 CAL,) LIQD 7 cartons of Osmolite 1.5 via PEG with 60 mL free water before and after bolus feedings.(2 cartons 3 times daily and 1 carton once daily) give 45 mL Prosource TF via PEG 3 times daily with 180 mL free water before and after.  Provides 2605 cal and 137 g protein, 2827 mL free water/100% estimated needs. 1659 mL 6  . ondansetron (ZOFRAN) 8 MG tablet Take 1 tablet (8 mg total) by mouth 2 (two) times daily as needed. Start on the third day after cisplatin chemotherapy. 30 tablet 1  . prochlorperazine (COMPAZINE) 10 MG tablet Take 1 tablet (10 mg total) by mouth every 6 (six) hours as needed (Nausea or vomiting). 30 tablet 1  . rivaroxaban (XARELTO) 20 MG TABS tablet TAKE 1 TABLET (20 MG TOTAL) BY MOUTH DAILY WITH SUPPER. 30 tablet 6   No current facility-administered medications for this visit.   Facility-Administered Medications Ordered in Other Visits  Medication Dose Route Frequency  Provider Last Rate Last Admin  . heparin lock flush 100 unit/mL  500 Units Intracatheter Once PRN Abra Lingenfelter, MD      . magnesium sulfate IVPB 2 g 50 mL  2 g Intravenous Once IrBenay PikeMD 50 mL/hr at 10/30/20 0946 2 g at 10/30/20 0946  . sodium chloride flush (NS) 0.9 % injection 10 mL  10 mL Intracatheter PRN IrBenay PikeMD         PHYSICAL EXAMINATION: ECOG PERFORMANCE STATUS: 0 - Asymptomatic  Vitals:   10/30/20 0859  BP: 102/75  Pulse: 98  Resp: 18  Temp: 97.8 F (36.6 C)  SpO2: 100%   Filed  Weights   10/30/20 0859  Weight: 197 lb 6.4 oz (89.5 kg)    Physical Exam Constitutional:      Appearance: Normal appearance.  HENT:     Head: Normocephalic and atraumatic.     Mouth/Throat:     Mouth: Mucous membranes are moist.     Pharynx: Posterior oropharyngeal erythema present. No oropharyngeal exudate.     Comments: Mucositis roof of mouth  Cardiovascular:     Rate and Rhythm: Normal rate and regular rhythm.     Pulses: Normal pulses.     Heart sounds: Normal heart sounds.  Pulmonary:     Effort: Pulmonary effort is normal.     Breath sounds: Normal breath sounds.  Abdominal:     General: Abdomen is flat.     Palpations: Abdomen is soft.  Musculoskeletal:        General: No swelling or tenderness.     Cervical back: No rigidity or tenderness.  Lymphadenopathy:     Cervical: Cervical adenopathy (left sided cervical lymphadenopathy smaller and softer, more mobile to palpation consistent with response.) present.  Neurological:     General: No focal deficit present.     Mental Status: He is alert.  Psychiatric:        Mood and Affect: Mood normal.      LABORATORY DATA:  I have reviewed the data as listed Lab Results  Component Value Date   WBC 4.4 10/30/2020   HGB 12.7 (L) 10/30/2020   HCT 37.4 (L) 10/30/2020   MCV 78.4 (L) 10/30/2020   PLT 125 (L) 10/30/2020     Chemistry      Component Value Date/Time   NA 138 10/30/2020 0846   NA  139 12/10/2019 1348   K 4.4 10/30/2020 0846   CL 101 10/30/2020 0846   CO2 27 10/30/2020 0846   BUN 32 (H) 10/30/2020 0846   BUN 17 12/10/2019 1348   CREATININE 1.42 (H) 10/30/2020 0846   CREATININE 1.17 09/03/2020 1534      Component Value Date/Time   CALCIUM 9.3 10/30/2020 0846   ALKPHOS 72 09/03/2020 1534   AST 12 (L) 09/03/2020 1534   ALT 12 09/03/2020 1534   BILITOT 0.4 09/03/2020 1534      RADIOGRAPHIC STUDIES: I have personally reviewed the radiological images as listed and agreed with the findings in the report. No results found. I independently reviewed PET imaging from outside facility  09/23/2020   Final Pathologic Diagnosis     "TONSIL", BIOPSY: Carcinoma, favor poorly differentiated squamous cell carcinoma. See comment.    Comment    Immunohistochemical stains are performed to characterize the tumor. The tumor cells are positive for EMA (patchy) and p63 (focal); negative for SMA and ERG. These findings, along with the morphologic features and prior immunohistochemical studies (case [id number]), support the diagnosis of carcinoma. A poorly differentiated, p16 positive squamous cell carcinoma is favored. The control(s) worked appropriately.   Labs from today reviewed, GFR dropping, encouraged hydration.   All questions were answered. The patient knows to call the clinic with any problems, questions or concerns. I spent 30 minutes in the care of this patient including H and P, review of records, counseling and coordination of care.     Benay Pike, MD 10/30/2020 10:26 AM

## 2020-10-30 NOTE — Assessment & Plan Note (Signed)
Will try liquid hydrocodone in addition to lidocaine. He has noted some difference with lidocaine but its still very bothersome.

## 2020-10-30 NOTE — Assessment & Plan Note (Signed)
Weight is stable since last visit Encouraged keeping up with calorie intake.

## 2020-10-30 NOTE — Progress Notes (Signed)
[facility] CSW Progress Notes  Was asked by Francesco Sor, RN, nurse navigator to contact patient as he has questions about Social Security disability.  Spoke w patient by phone.  He currently has both Medicaid and SSI.  He contacted IT trainer and was told that he now has enough work credits to apply for [employer] which would replace his SSI.  Advised that he needs to contact [facility], agency that filed his first application for disability benefits, to see if they can assist him. Also contacted [facility] - they will research his situation and contact him to let him know his next steps.  Edwyna Shell, LCSW Clinical Social Worker Phone:  [phone]

## 2020-10-31 ENCOUNTER — Ambulatory Visit
Admission: RE | Admit: 2020-10-31 | Discharge: 2020-10-31 | Disposition: A | Discharge status: Home / Self Care | Payer: Medicaid Other | Source: Ambulatory Visit | Attending: Radiation Oncology | Admitting: Radiation Oncology

## 2020-10-31 DIAGNOSIS — C090 Malignant neoplasm of tonsillar fossa: Secondary | ICD-10-CM | POA: Diagnosis not present

## 2020-10-31 DIAGNOSIS — Z510 Encounter for antineoplastic radiation therapy: Secondary | ICD-10-CM | POA: Diagnosis not present

## 2020-11-03 ENCOUNTER — Other Ambulatory Visit: Payer: Self-pay

## 2020-11-03 ENCOUNTER — Other Ambulatory Visit: Payer: Self-pay | Admitting: Radiation Oncology

## 2020-11-03 ENCOUNTER — Ambulatory Visit
Admission: RE | Admit: 2020-11-03 | Discharge: 2020-11-03 | Disposition: A | Discharge status: Home / Self Care | Payer: Medicaid Other | Source: Ambulatory Visit | Attending: Radiation Oncology | Admitting: Radiation Oncology

## 2020-11-03 DIAGNOSIS — C090 Malignant neoplasm of tonsillar fossa: Secondary | ICD-10-CM | POA: Diagnosis not present

## 2020-11-03 DIAGNOSIS — Z510 Encounter for antineoplastic radiation therapy: Secondary | ICD-10-CM | POA: Diagnosis not present

## 2020-11-03 MED ORDER — LIDOCAINE 2 % EX GEL: CUTANEOUS | 3 refills | Status: DC

## 2020-11-03 MED FILL — Amlodipine Besylate Tab 10 MG (Base Equivalent): ORAL | 30 days supply | Qty: 30 | Fill #0 | Status: AC

## 2020-11-04 ENCOUNTER — Inpatient Hospital Stay: Payer: Medicaid Other | Admitting: Medical

## 2020-11-04 ENCOUNTER — Ambulatory Visit: Payer: Medicaid Other | Admitting: Hematology and Oncology

## 2020-11-04 ENCOUNTER — Encounter: Payer: Self-pay | Admitting: General Practice

## 2020-11-04 ENCOUNTER — Ambulatory Visit
Admission: RE | Admit: 2020-11-04 | Discharge: 2020-11-04 | Disposition: A | Discharge status: Home / Self Care | Payer: Medicaid Other | Source: Ambulatory Visit | Attending: Radiation Oncology | Admitting: Radiation Oncology

## 2020-11-04 ENCOUNTER — Inpatient Hospital Stay: Payer: Medicaid Other

## 2020-11-04 ENCOUNTER — Telehealth: Payer: Self-pay | Admitting: Hematology and Oncology

## 2020-11-04 DIAGNOSIS — C099 Malignant neoplasm of tonsil, unspecified: Secondary | ICD-10-CM

## 2020-11-04 DIAGNOSIS — Z510 Encounter for antineoplastic radiation therapy: Secondary | ICD-10-CM | POA: Diagnosis not present

## 2020-11-04 DIAGNOSIS — C090 Malignant neoplasm of tonsillar fossa: Secondary | ICD-10-CM | POA: Diagnosis not present

## 2020-11-04 DIAGNOSIS — Z95828 Presence of other vascular implants and grafts: Secondary | ICD-10-CM

## 2020-11-04 LAB — CBC WITH DIFFERENTIAL (CANCER CENTER ONLY)
Abs Immature Granulocytes: 0.01 10*3/uL (ref 0.00–0.07)
Basophils Absolute: 0 10*3/uL (ref 0.0–0.1)
Basophils Relative: 0 %
Eosinophils Absolute: 0 10*3/uL (ref 0.0–0.5)
Eosinophils Relative: 1 %
HCT: 35.8 % — ABNORMAL LOW (ref 39.0–52.0)
Hemoglobin: 12.2 g/dL — ABNORMAL LOW (ref 13.0–17.0)
Immature Granulocytes: 1 %
Lymphocytes Relative: 9 %
Lymphs Abs: 0.2 10*3/uL — ABNORMAL LOW (ref 0.7–4.0)
MCH: 26.7 pg (ref 26.0–34.0)
MCHC: 34.1 g/dL (ref 30.0–36.0)
MCV: 78.3 fL — ABNORMAL LOW (ref 80.0–100.0)
Monocytes Absolute: 0.5 10*3/uL (ref 0.1–1.0)
Monocytes Relative: 23 %
Neutro Abs: 1.4 10*3/uL — ABNORMAL LOW (ref 1.7–7.7)
Neutrophils Relative %: 66 %
Platelet Count: 122 10*3/uL — ABNORMAL LOW (ref 150–400)
RBC: 4.57 MIL/uL (ref 4.22–5.81)
RDW: 16.1 % — ABNORMAL HIGH (ref 11.5–15.5)
WBC Count: 2.1 10*3/uL — ABNORMAL LOW (ref 4.0–10.5)
nRBC: 0 % (ref 0.0–0.2)

## 2020-11-04 LAB — BASIC METABOLIC PANEL - CANCER CENTER ONLY
Anion gap: 13 (ref 5–15)
BUN: 27 mg/dL — ABNORMAL HIGH (ref 6–20)
CO2: 24 mmol/L (ref 22–32)
Calcium: 9.3 mg/dL (ref 8.9–10.3)
Chloride: 100 mmol/L (ref 98–111)
Creatinine: 1.43 mg/dL — ABNORMAL HIGH (ref 0.61–1.24)
GFR, Estimated: 57 mL/min — ABNORMAL LOW (ref >60)
Glucose, Bld: 142 mg/dL — ABNORMAL HIGH (ref 70–99)
Potassium: 4.1 mmol/L (ref 3.5–5.1)
Sodium: 137 mmol/L (ref 135–145)

## 2020-11-04 LAB — MAGNESIUM: Magnesium: 1.8 mg/dL (ref 1.7–2.4)

## 2020-11-04 MED ORDER — HEPARIN SOD (PORK) LOCK FLUSH 100 UNIT/ML IV SOLN
500.0000 [IU] | Freq: Once | INTRAVENOUS | Status: AC
  Administered 2020-11-04: 500 [IU] via INTRAVENOUS
  Filled 2020-11-04: qty 5

## 2020-11-04 MED ORDER — SODIUM CHLORIDE 0.9% FLUSH
10.0000 mL | INTRAVENOUS | Status: DC | PRN
  Administered 2020-11-04: 10 mL via INTRAVENOUS
  Filled 2020-11-04: qty 10

## 2020-11-04 NOTE — Telephone Encounter (Signed)
Scheduled appt per 4/19 sch msg. Called pt, no answer. Left msg with appt date and time.  

## 2020-11-04 NOTE — Patient Instructions (Signed)
Implanted Port Insertion, Care After This sheet gives you information about how to care for yourself after your procedure. Your health care provider may also give you more specific instructions. If you have problems or questions, contact your health care provider. What can I expect after the procedure? After the procedure, it is common to have:  Discomfort at the port insertion site.  Bruising on the skin over the port. This should improve over 3-4 days. Follow these instructions at home: Port care  After your port is placed, you will get a manufacturer's information card. The card has information about your port. Keep this card with you at all times.  Take care of the port as told by your health care provider. Ask your health care provider if you or a family member can get training for taking care of the port at home. A home health care nurse may also take care of the port.  Make sure to remember what type of port you have. Incision care  Follow instructions from your health care provider about how to take care of your port insertion site. Make sure you: ? Wash your hands with soap and water before and after you change your bandage (dressing). If soap and water are not available, use hand sanitizer. ? Change your dressing as told by your health care provider. ? Leave stitches (sutures), skin glue, or adhesive strips in place. These skin closures may need to stay in place for 2 weeks or longer. If adhesive strip edges start to loosen and curl up, you may trim the loose edges. Do not remove adhesive strips completely unless your health care provider tells you to do that.  Check your port insertion site every day for signs of infection. Check for: ? Redness, swelling, or pain. ? Fluid or blood. ? Warmth. ? Pus or a bad smell.      Activity  Return to your normal activities as told by your health care provider. Ask your health care provider what activities are safe for you.  Do not  lift anything that is heavier than 10 lb (4.5 kg), or the limit that you are told, until your health care provider says that it is safe. General instructions  Take over-the-counter and prescription medicines only as told by your health care provider.  Do not take baths, swim, or use a hot tub until your health care provider approves. Ask your health care provider if you may take showers. You may only be allowed to take sponge baths.  Do not drive for 24 hours if you were given a sedative during your procedure.  Wear a medical alert bracelet in case of an emergency. This will tell any health care providers that you have a port.  Keep all follow-up visits as told by your health care provider. This is important. Contact a health care provider if:  You cannot flush your port with saline as directed, or you cannot draw blood from the port.  You have a fever or chills.  You have redness, swelling, or pain around your port insertion site.  You have fluid or blood coming from your port insertion site.  Your port insertion site feels warm to the touch.  You have pus or a bad smell coming from the port insertion site. Get help right away if:  You have chest pain or shortness of breath.  You have bleeding from your port that you cannot control. Summary  Take care of the port as told by your   health care provider. Keep the manufacturer's information card with you at all times.  Change your dressing as told by your health care provider.  Contact a health care provider if you have a fever or chills or if you have redness, swelling, or pain around your port insertion site.  Keep all follow-up visits as told by your health care provider. This information is not intended to replace advice given to you by your health care provider. Make sure you discuss any questions you have with your health care provider. Document Revised: 01/31/2018 Document Reviewed: 01/31/2018 Elsevier Patient Education   2021 Elsevier Inc.  

## 2020-11-04 NOTE — Progress Notes (Signed)
[facility] CSW Progress Notes  Email from [facility] - "We received a referral for Mitchell Cooper on 12/11/2019, interviewed him on 12/26/2019 and filed his claim on 01/11/2020. He was awarded SSI on 10/05/2020 at $560.67 with backpay being paid in three installments. He should have automatically received Medicaid. He is not eligible for SSDI because he does not have enough work credits."  Left VM for patient with this information - suggested he contact either [employer] or [employer] in order to discuss his options regarding refiling for disability.  This is not something the SW can assist with, other than providing him w information on who to call to discuss his situation.   Edwyna Shell, LCSW Clinical Social Worker Phone:  [phone]

## 2020-11-05 ENCOUNTER — Ambulatory Visit
Admission: RE | Admit: 2020-11-05 | Discharge: 2020-11-05 | Disposition: A | Discharge status: Home / Self Care | Payer: Medicaid Other | Source: Ambulatory Visit | Attending: Radiation Oncology | Admitting: Radiation Oncology

## 2020-11-05 ENCOUNTER — Other Ambulatory Visit: Payer: Self-pay

## 2020-11-05 ENCOUNTER — Inpatient Hospital Stay (HOSPITAL_BASED_OUTPATIENT_CLINIC_OR_DEPARTMENT_OTHER): Payer: Medicaid Other | Admitting: Medical

## 2020-11-05 VITALS — BP 121/87 | HR 118 | Temp 96.6°F | Resp 18 | Ht 69.0 in | Wt 189.1 lb

## 2020-11-05 DIAGNOSIS — C099 Malignant neoplasm of tonsil, unspecified: Secondary | ICD-10-CM | POA: Diagnosis not present

## 2020-11-05 DIAGNOSIS — C090 Malignant neoplasm of tonsillar fossa: Secondary | ICD-10-CM | POA: Diagnosis not present

## 2020-11-05 DIAGNOSIS — K1231 Oral mucositis (ulcerative) due to antineoplastic therapy: Secondary | ICD-10-CM | POA: Diagnosis not present

## 2020-11-05 DIAGNOSIS — Z510 Encounter for antineoplastic radiation therapy: Secondary | ICD-10-CM | POA: Diagnosis not present

## 2020-11-05 NOTE — Progress Notes (Signed)
[facility] Progress Note   Mitchell Cooper [id number] 30-May-1961 60 y.o.  Mitchell Cooper is managed by Dr. Benay Pike  Actively treated with chemotherapy/immunotherapy/hormonal therapy: yes  Current therapy: Concurrent chemoradiation with cisplatin  Next scheduled appointment with provider: 11/13/2020 Assessment: Plan:    Mucositis due to antineoplastic therapy  Squamous cell carcinoma of left tonsil (HCC)   Mucositis: The patient was instructed to continue using viscous lidocaine as needed.  Squamous cell carcinoma of the left tonsil: The patient continues on concurrent chemoradiation receiving cisplatin 30 mg per metered squared for total of 66 mg.  This was discussed with Dr. Chryl Heck via text message.  We will proceed with the patient's treatment tomorrow schedule and have him return for follow-up on 11/13/2021.  Please see After Visit Summary for patient specific instructions.  Future Appointments  Date Time Provider [place]  11/11/2020  2:05 PM [facility] [id number] [facility] None  11/12/2020  2:00 PM [facility] [id number] [facility] None  11/13/2020  8:00 AM [facility] [id number] [facility] None  11/13/2020  8:45 AM [facility] INFUSION [facility] None  11/13/2020  9:00 AM Iruku, Praveena, MD [facility] None  11/13/2020 10:00 AM [facility] INFUSION [facility] None  11/13/2020 10:30 AM Ernestene Kiel L, RD [facility] None  11/14/2020  2:00 PM [facility] [id number] [facility] None  11/17/2020  2:00 PM [facility] [id number] [facility] None  11/18/2020  2:00 PM [facility] [id number] [facility] None  11/19/2020  2:00 PM [facility] [id number] [facility] None  11/20/2020  8:00 AM [facility] [id number] [facility] None  11/20/2020  8:45 AM [facility] [place] FLUSH [facility] None  11/20/2020  9:00 AM Iruku, Praveena, MD [facility] None  11/20/2020 10:00 AM [facility] INFUSION [facility] None  11/20/2020 10:30 AM Neff, Barbara L, RD [facility] None   11/21/2020  2:00 PM [facility] [id number] [facility] None  11/24/2020  2:00 PM [facility] [id number] [facility] None  11/25/2020  2:00 PM [facility] [id number] [facility] None  12/08/2020  9:10 AM Ladell Pier, MD [facility] None    No orders of the defined types were placed in this encounter.      Subjective:   Patient ID:  Mitchell Cooper is a 60 y.o. (DOB 1961-01-27) male.  Chief Complaint: No chief complaint on file.   HPI Mitchell Cooper  is a 60 y.o. male with a diagnosis of a squamous cell carcinoma of the left tonsil.  He is followed by Dr. Chryl Heck and continues on concurrent chemoradiation receiving dose reduced cisplatin 30 mg per metered squared for total of 66 mg.  He reports slight constipation and sores in his mouth.  He continues to eat despite mucositis.  He denies fevers, chills, sweats, nausea, vomiting, or diarrhea.  He has lost 8 pounds over the last 6 days.   Medications: I have reviewed the patient's current medications.  Allergies: No Known Allergies  Past Medical History:  Diagnosis Date  . Cancer ([place])    Left Tonsillar Cancer   . DVT (deep venous thrombosis) ([place])   . Hypertension 1997   has never taken medications  . Tobacco dependence     Past Surgical History:  Procedure Laterality Date  . IR GASTROSTOMY TUBE MOD SED  09/11/2020  . IR IMAGING GUIDED PORT INSERTION  09/11/2020  . KNEE SURGERY  approx. 1984   reconstructive surgery of left knee; arthroscopic procedure about 1997  . MULTIPLE EXTRACTIONS WITH ALVEOLOPLASTY N/A 09/25/2020   Procedure: MULTIPLE EXTRACTIONS WITH ALVEOLOPLASTY;  Surgeon: Charlaine Dalton, DMD;  Location: [facility];  Service: Dentistry;  Laterality:  N/A;  . PERIPHERAL VASCULAR CATHETERIZATION Left 06/16/2016   Procedure: Lower Extremity Venography- Venus Thrombolysis;  Surgeon: Serafina Mitchell, MD;  Location: [facility];  Service: Cardiovascular;  Laterality: Left;    Family History  Problem Relation Age of  Onset  . CAD Mother 53    Social History   Socioeconomic History  . Marital status: Married    Spouse name: Not on file  . Number of children: Not on file  . Years of education: Not on file  . Highest education level: Not on file  Occupational History  . Occupation: Architect  Tobacco Use  . Smoking status: Current Some Day Smoker    Types: Cigarettes    Last attempt to quit: 11/21/2016    Years since quitting: 3.9  . Smokeless tobacco: Never Used  Vaping Use  . Vaping Use: Never used  Substance and Sexual Activity  . Alcohol use: Yes    Alcohol/week: 6.0 standard drinks    Types: 6 Cans of beer per week  . Drug use: No  . Sexual activity: Not Currently  Other Topics Concern  . Not on file  Social History Narrative  . Not on file   Social Determinants of Health   Financial Resource Strain: Low Risk   . Difficulty of Paying Living Expenses: Not very hard  Food Insecurity: No Food Insecurity  . Worried About Charity fundraiser in the Last Year: Never true  . Ran Out of Food in the Last Year: Never true  Transportation Needs: No Transportation Needs  . Lack of Transportation (Medical): No  . Lack of Transportation (Non-Medical): No  Physical Activity: Inactive  . Days of Exercise per Week: 0 days  . Minutes of Exercise per Session: 0 min  Stress: No Stress Concern Present  . Feeling of Stress : Only a little  Social Connections: Socially Integrated  . Frequency of Communication with Friends and Family: More than three times a week  . Frequency of Social Gatherings with Friends and Family: More than three times a week  . Attends Religious Services: More than 4 times per year  . Active Member of Clubs or Organizations: Yes  . Attends Archivist Meetings: More than 4 times per year  . Marital Status: Married  Human resources officer Violence: Not on file    Past Medical History, Surgical history, Social history, and Family history were reviewed and updated as  appropriate.   Please see review of systems for further details on the patient's review from today.   Review of Systems:  Review of Systems  Constitutional: Negative for appetite change, chills, diaphoresis and fever.  HENT: Positive for mouth sores. Negative for sore throat and trouble swallowing.   Respiratory: Negative for cough, shortness of breath and wheezing.   Cardiovascular: Negative for chest pain and palpitations.  Gastrointestinal: Negative for constipation, diarrhea, nausea and vomiting.    Objective:   Physical Exam:  BP 121/87 (BP Location: Left Arm, Patient Position: Sitting)   Pulse (!) 118   Temp (!) 96.6 F (35.9 C) (Tympanic)   Resp 18   Ht 5\' 9"  (1.753 m)   Wt 189 lb 1.6 oz (85.8 kg)   SpO2 99%   BMI 27.93 kg/m  ECOG: 1  Physical Exam Constitutional:      General: He is not in acute distress.    Appearance: He is not diaphoretic.  HENT:     Head: Normocephalic.     Mouth/Throat:  Mouth: Mucous membranes are moist.     Comments: Significant areas of ulceration are noted in the patient's posterior pharynx. Eyes:     General: No scleral icterus.       Right eye: No discharge.        Left eye: No discharge.  Cardiovascular:     Rate and Rhythm: Normal rate and regular rhythm.     Heart sounds: Normal heart sounds. No murmur heard. No friction rub. No gallop.   Pulmonary:     Effort: Pulmonary effort is normal. No respiratory distress.     Breath sounds: Normal breath sounds. No stridor. No wheezing or rales.  Chest:     Chest wall: No tenderness.  Abdominal:     General: Bowel sounds are normal. There is no distension.     Tenderness: There is no abdominal tenderness. There is no guarding.     Comments: The patient has a PEG tube in his left upper abdomen.  Musculoskeletal:     Cervical back: Normal range of motion and neck supple.  Lymphadenopathy:     Cervical: No cervical adenopathy.  Skin:    General: Skin is warm and dry.   Neurological:     Mental Status: He is alert.     Coordination: Coordination normal.     Lab Review:     Component Value Date/Time   NA 137 11/04/2020 1357   NA 139 12/10/2019 1348   K 4.1 11/04/2020 1357   CL 100 11/04/2020 1357   CO2 24 11/04/2020 1357   GLUCOSE 142 (H) 11/04/2020 1357   BUN 27 (H) 11/04/2020 1357   BUN 17 12/10/2019 1348   CREATININE 1.43 (H) 11/04/2020 1357   CALCIUM 9.3 11/04/2020 1357   PROT 7.9 09/03/2020 1534   ALBUMIN 3.6 09/03/2020 1534   AST 12 (L) 09/03/2020 1534   ALT 12 09/03/2020 1534   ALKPHOS 72 09/03/2020 1534   BILITOT 0.4 09/03/2020 1534   GFRNONAA 57 (L) 11/04/2020 1357   GFRAA 89 12/10/2019 1348       Component Value Date/Time   WBC 2.1 (L) 11/04/2020 1357   WBC 4.4 10/30/2020 0846   RBC 4.57 11/04/2020 1357   HGB 12.2 (L) 11/04/2020 1357   HGB 14.9 12/10/2019 1348   HCT 35.8 (L) 11/04/2020 1357   HCT 44.5 12/10/2019 1348   PLT 122 (L) 11/04/2020 1357   PLT 209 12/10/2019 1348   MCV 78.3 (L) 11/04/2020 1357   MCV 80 12/10/2019 1348   MCH 26.7 11/04/2020 1357   MCHC 34.1 11/04/2020 1357   RDW 16.1 (H) 11/04/2020 1357   RDW 15.9 (H) 12/10/2019 1348   LYMPHSABS 0.2 (L) 11/04/2020 1357   MONOABS 0.5 11/04/2020 1357   EOSABS 0.0 11/04/2020 1357   BASOSABS 0.0 11/04/2020 1357   -------------------------------  Imaging from last 24 hours (if applicable):  Radiology interpretation: No results found.    Okay to proceed with chemotherapy with cisplatin 30 mg/m2 for total dose of 66 mg of cisplatin despite WBC of 2.1 and ANC of 1.4.  Dr. Chryl Heck was contacted and is agreeable to proceed with no further dose adjustments.  The patient is scheduled to see her next week for follow-up at which time another CBC will be completed.  I will see the patient again tomorrow while he is here for chemotherapy and will review neutropenic precautions with him.  Sandi Mealy, MHS, PA-C Physician Assistant

## 2020-11-06 ENCOUNTER — Ambulatory Visit
Admission: RE | Admit: 2020-11-06 | Discharge: 2020-11-06 | Disposition: A | Discharge status: Home / Self Care | Payer: Medicaid Other | Source: Ambulatory Visit | Attending: Radiation Oncology | Admitting: Radiation Oncology

## 2020-11-06 ENCOUNTER — Inpatient Hospital Stay: Payer: Medicaid Other | Admitting: Nutrition

## 2020-11-06 ENCOUNTER — Other Ambulatory Visit: Payer: Self-pay | Admitting: Oncology

## 2020-11-06 ENCOUNTER — Other Ambulatory Visit: Payer: Medicaid Other

## 2020-11-06 ENCOUNTER — Inpatient Hospital Stay: Payer: Medicaid Other

## 2020-11-06 VITALS — BP 116/74 | HR 91 | Temp 98.6°F | Resp 16

## 2020-11-06 DIAGNOSIS — J358 Other chronic diseases of tonsils and adenoids: Secondary | ICD-10-CM | POA: Diagnosis not present

## 2020-11-06 DIAGNOSIS — N183 Chronic kidney disease, stage 3 unspecified: Secondary | ICD-10-CM | POA: Diagnosis not present

## 2020-11-06 DIAGNOSIS — C090 Malignant neoplasm of tonsillar fossa: Secondary | ICD-10-CM | POA: Diagnosis not present

## 2020-11-06 DIAGNOSIS — C099 Malignant neoplasm of tonsil, unspecified: Secondary | ICD-10-CM | POA: Diagnosis not present

## 2020-11-06 DIAGNOSIS — Z510 Encounter for antineoplastic radiation therapy: Secondary | ICD-10-CM | POA: Diagnosis not present

## 2020-11-06 DIAGNOSIS — R634 Abnormal weight loss: Secondary | ICD-10-CM | POA: Diagnosis not present

## 2020-11-06 MED ORDER — SODIUM CHLORIDE 0.9 % IV SOLN
150.0000 mg | Freq: Once | INTRAVENOUS | Status: AC
  Administered 2020-11-06: 150 mg via INTRAVENOUS
  Filled 2020-11-06: qty 150

## 2020-11-06 MED ORDER — SODIUM CHLORIDE 0.9% FLUSH
10.0000 mL | INTRAVENOUS | Status: DC | PRN
  Administered 2020-11-06: 10 mL
  Filled 2020-11-06: qty 10

## 2020-11-06 MED ORDER — PALONOSETRON HCL INJECTION 0.25 MG/5ML
0.2500 mg | Freq: Once | INTRAVENOUS | Status: AC
  Administered 2020-11-06: 0.25 mg via INTRAVENOUS

## 2020-11-06 MED ORDER — SODIUM CHLORIDE 0.9 % IV SOLN
Freq: Once | INTRAVENOUS | Status: AC
  Filled 2020-11-06: qty 250

## 2020-11-06 MED ORDER — SODIUM CHLORIDE 0.9 % IV SOLN
30.0000 mg/m2 | Freq: Once | INTRAVENOUS | Status: AC
  Administered 2020-11-06: 66 mg via INTRAVENOUS
  Filled 2020-11-06: qty 66

## 2020-11-06 MED ORDER — PALONOSETRON HCL INJECTION 0.25 MG/5ML
INTRAVENOUS | Status: AC
  Filled 2020-11-06: qty 5

## 2020-11-06 MED ORDER — MAGNESIUM SULFATE 2 GM/50ML IV SOLN
2.0000 g | Freq: Once | INTRAVENOUS | Status: AC
  Administered 2020-11-06: 2 g via INTRAVENOUS

## 2020-11-06 MED ORDER — MAGNESIUM SULFATE 2 GM/50ML IV SOLN
INTRAVENOUS | Status: AC
  Filled 2020-11-06: qty 50

## 2020-11-06 MED ORDER — POTASSIUM CHLORIDE IN NACL 20-0.9 MEQ/L-% IV SOLN
Freq: Once | INTRAVENOUS | Status: AC
  Filled 2020-11-06: qty 1000

## 2020-11-06 MED ORDER — HEPARIN SOD (PORK) LOCK FLUSH 100 UNIT/ML IV SOLN
500.0000 [IU] | Freq: Once | INTRAVENOUS | Status: AC | PRN
  Administered 2020-11-06: 500 [IU]
  Filled 2020-11-06: qty 5

## 2020-11-06 MED ORDER — SODIUM CHLORIDE 0.9 % IV SOLN
10.0000 mg | Freq: Once | INTRAVENOUS | Status: AC
  Administered 2020-11-06: 10 mg via INTRAVENOUS
  Filled 2020-11-06: qty 10

## 2020-11-06 NOTE — Patient Instructions (Signed)
[facility] Discharge Instructions for Patients Receiving Chemotherapy  Today you received the following chemotherapy agents cisplatin  To help prevent nausea and vomiting after your treatment, we encourage you to take your nausea medication as directed   If you develop nausea and vomiting that is not controlled by your nausea medication, call the clinic.   BELOW ARE SYMPTOMS THAT SHOULD BE REPORTED IMMEDIATELY:  *FEVER GREATER THAN 100.5 F  *CHILLS WITH OR WITHOUT FEVER  NAUSEA AND VOMITING THAT IS NOT CONTROLLED WITH YOUR NAUSEA MEDICATION  *UNUSUAL SHORTNESS OF BREATH  *UNUSUAL BRUISING OR BLEEDING  TENDERNESS IN MOUTH AND THROAT WITH OR WITHOUT PRESENCE OF ULCERS  *URINARY PROBLEMS  *BOWEL PROBLEMS  UNUSUAL RASH Items with * indicate a potential emergency and should be followed up as soon as possible.  Feel free to call the clinic should you have any questions or concerns. The clinic phone number is [phone].  Please show the CHEMO ALERT CARD at check-in to the Emergency Department and triage nurse.   

## 2020-11-06 NOTE — Progress Notes (Signed)
Nutrition follow-up completed with patient during infusion for tonsil cancer.  Patient receives concurrent chemoradiation therapy as well. Weight decreased and documented as 189.1 pounds April 20 down from 197.4 pounds April 14. Noted labs: Glucose 142, BUN 27, creatinine 1.43. Patient reports only able to drink 6 cartons of Osmolite 1.5.  He reports he has not been able to drink as much over the past several days secondary to constipation.  Reports he took MiraLAX with good success today.  He never received Prosource tube feeding protein packets.  He denies other nutrition impact symptoms.  Estimated nutrition needs: 2400-2600 cal, 120-140 g protein, 2.5 L fluid.  Nutrition diagnosis: Food and nutrition related knowledge deficit continues.  Intervention: Increase Osmolite 1.5-7 cartons daily either by mouth or through feeding tube as tolerated. Will check on Prosource TF. Continue strategies for ensuring bowel regimen.  Take MiraLAX daily and notify MD if this is not promoting appropriate bowel movements. Stressed importance of weight maintenance.  Monitoring, evaluation, goals: Patient will tolerate adequate calories and protein to minimize weight loss.  Next visit: Thursday, April 28 during infusion.  **Disclaimer: This note was dictated with voice recognition software. Similar sounding words can inadvertently be transcribed and this note may contain transcription errors which may not have been corrected upon publication of note.**

## 2020-11-06 NOTE — Progress Notes (Signed)
Per OV from [employer] "Okay to proceed with chemotherapy with cisplatin 30 mg/m2 for total dose of 66 mg of cisplatin despite WBC of 2.1 and ANC of 1.4.  Dr. Chryl Heck was contacted and is agreeable to proceed with no further dose adjustments."

## 2020-11-06 NOTE — Progress Notes (Signed)
Mr. Disano is receiving weekly cisplatin for his head and neck cancer.  His ANC today is 1.4.  This is a decline from prior.  We are going to treat today but he will need monitoring for further declines and may need immune boosters with subsequent treatments

## 2020-11-07 ENCOUNTER — Ambulatory Visit
Admission: RE | Admit: 2020-11-07 | Discharge: 2020-11-07 | Disposition: A | Discharge status: Home / Self Care | Payer: Medicaid Other | Source: Ambulatory Visit | Attending: Radiation Oncology | Admitting: Radiation Oncology

## 2020-11-07 ENCOUNTER — Other Ambulatory Visit: Payer: Self-pay

## 2020-11-07 DIAGNOSIS — C090 Malignant neoplasm of tonsillar fossa: Secondary | ICD-10-CM | POA: Diagnosis not present

## 2020-11-07 DIAGNOSIS — Z510 Encounter for antineoplastic radiation therapy: Secondary | ICD-10-CM | POA: Diagnosis not present

## 2020-11-08 ENCOUNTER — Encounter: Payer: Self-pay | Admitting: Hematology and Oncology

## 2020-11-10 ENCOUNTER — Other Ambulatory Visit: Payer: Self-pay

## 2020-11-10 ENCOUNTER — Ambulatory Visit
Admission: RE | Admit: 2020-11-10 | Discharge: 2020-11-10 | Disposition: A | Discharge status: Home / Self Care | Payer: Medicaid Other | Source: Ambulatory Visit | Attending: Radiation Oncology | Admitting: Radiation Oncology

## 2020-11-10 DIAGNOSIS — C090 Malignant neoplasm of tonsillar fossa: Secondary | ICD-10-CM | POA: Diagnosis not present

## 2020-11-10 DIAGNOSIS — Z510 Encounter for antineoplastic radiation therapy: Secondary | ICD-10-CM | POA: Diagnosis not present

## 2020-11-10 NOTE — Progress Notes (Signed)
Oncology Nurse Navigator Documentation  I saw Mr. Gavigan during is weekly undertreat visit with Dr. Isidore Moos today. He reported that his PEG tube fell out on Saturday while he was in the shower and he replaced it immediately without difficulty. I assessed his tube today and I'm able to aspirate gastic contents and flush the PEG without difficulty or pain reported by Mr. Gershman. I changed the dressing to the PEG site and there was yellow/brown drainage present without any signs of infection. I told Mr. Whitehurst that it's ok to use the PEG but to watch it carefully at all times and to let us know if it dislodges again. He verbalized his understanding and knows to call me as needed.   Harlow Asa RN, BSN, OCN Head & Neck Oncology Nurse [place] [facility] Phone # [phone]  Fax # [phone]

## 2020-11-11 ENCOUNTER — Ambulatory Visit
Admission: RE | Admit: 2020-11-11 | Discharge: 2020-11-11 | Disposition: A | Discharge status: Home / Self Care | Payer: Medicaid Other | Source: Ambulatory Visit | Attending: Radiation Oncology | Admitting: Radiation Oncology

## 2020-11-11 DIAGNOSIS — Z510 Encounter for antineoplastic radiation therapy: Secondary | ICD-10-CM | POA: Diagnosis not present

## 2020-11-11 DIAGNOSIS — C090 Malignant neoplasm of tonsillar fossa: Secondary | ICD-10-CM | POA: Diagnosis not present

## 2020-11-12 ENCOUNTER — Ambulatory Visit
Admission: RE | Admit: 2020-11-12 | Discharge: 2020-11-12 | Disposition: A | Discharge status: Home / Self Care | Payer: Medicaid Other | Source: Ambulatory Visit | Attending: Radiation Oncology | Admitting: Radiation Oncology

## 2020-11-12 DIAGNOSIS — C090 Malignant neoplasm of tonsillar fossa: Secondary | ICD-10-CM | POA: Diagnosis not present

## 2020-11-12 DIAGNOSIS — Z510 Encounter for antineoplastic radiation therapy: Secondary | ICD-10-CM | POA: Diagnosis not present

## 2020-11-13 ENCOUNTER — Inpatient Hospital Stay: Payer: Medicaid Other

## 2020-11-13 ENCOUNTER — Other Ambulatory Visit: Payer: Self-pay

## 2020-11-13 ENCOUNTER — Telehealth: Payer: Self-pay | Admitting: Hematology and Oncology

## 2020-11-13 ENCOUNTER — Other Ambulatory Visit: Payer: Medicaid Other

## 2020-11-13 ENCOUNTER — Ambulatory Visit
Admission: RE | Admit: 2020-11-13 | Discharge: 2020-11-13 | Disposition: A | Discharge status: Home / Self Care | Payer: Medicaid Other | Source: Ambulatory Visit | Attending: Radiation Oncology | Admitting: Radiation Oncology

## 2020-11-13 ENCOUNTER — Telehealth: Payer: Self-pay | Admitting: Nutrition

## 2020-11-13 ENCOUNTER — Inpatient Hospital Stay: Payer: Medicaid Other | Admitting: Nutrition

## 2020-11-13 ENCOUNTER — Encounter: Payer: Self-pay | Admitting: Hematology and Oncology

## 2020-11-13 ENCOUNTER — Inpatient Hospital Stay (HOSPITAL_BASED_OUTPATIENT_CLINIC_OR_DEPARTMENT_OTHER): Payer: Medicaid Other | Admitting: Hematology and Oncology

## 2020-11-13 VITALS — BP 117/85 | HR 95 | Temp 98.9°F | Resp 16 | Ht 69.0 in | Wt 187.4 lb

## 2020-11-13 DIAGNOSIS — C099 Malignant neoplasm of tonsil, unspecified: Secondary | ICD-10-CM

## 2020-11-13 DIAGNOSIS — R634 Abnormal weight loss: Secondary | ICD-10-CM | POA: Diagnosis not present

## 2020-11-13 DIAGNOSIS — K1231 Oral mucositis (ulcerative) due to antineoplastic therapy: Secondary | ICD-10-CM | POA: Diagnosis not present

## 2020-11-13 DIAGNOSIS — D701 Agranulocytosis secondary to cancer chemotherapy: Secondary | ICD-10-CM | POA: Diagnosis not present

## 2020-11-13 DIAGNOSIS — Z95828 Presence of other vascular implants and grafts: Secondary | ICD-10-CM

## 2020-11-13 DIAGNOSIS — T451X5A Adverse effect of antineoplastic and immunosuppressive drugs, initial encounter: Secondary | ICD-10-CM | POA: Diagnosis not present

## 2020-11-13 DIAGNOSIS — C090 Malignant neoplasm of tonsillar fossa: Secondary | ICD-10-CM | POA: Diagnosis not present

## 2020-11-13 DIAGNOSIS — Z510 Encounter for antineoplastic radiation therapy: Secondary | ICD-10-CM | POA: Diagnosis not present

## 2020-11-13 LAB — CBC WITH DIFFERENTIAL/PLATELET
Abs Immature Granulocytes: 0.01 10*3/uL (ref 0.00–0.07)
Basophils Absolute: 0 10*3/uL (ref 0.0–0.1)
Basophils Relative: 1 %
Eosinophils Absolute: 0 10*3/uL (ref 0.0–0.5)
Eosinophils Relative: 1 %
HCT: 34 % — ABNORMAL LOW (ref 39.0–52.0)
Hemoglobin: 11.5 g/dL — ABNORMAL LOW (ref 13.0–17.0)
Immature Granulocytes: 1 %
Lymphocytes Relative: 11 %
Lymphs Abs: 0.2 10*3/uL — ABNORMAL LOW (ref 0.7–4.0)
MCH: 26.9 pg (ref 26.0–34.0)
MCHC: 33.8 g/dL (ref 30.0–36.0)
MCV: 79.6 fL — ABNORMAL LOW (ref 80.0–100.0)
Monocytes Absolute: 0.3 10*3/uL (ref 0.1–1.0)
Monocytes Relative: 18 %
Neutro Abs: 1.1 10*3/uL — ABNORMAL LOW (ref 1.7–7.7)
Neutrophils Relative %: 68 %
Platelets: 120 10*3/uL — ABNORMAL LOW (ref 150–400)
RBC: 4.27 MIL/uL (ref 4.22–5.81)
RDW: 16.8 % — ABNORMAL HIGH (ref 11.5–15.5)
WBC: 1.6 10*3/uL — ABNORMAL LOW (ref 4.0–10.5)
nRBC: 0 % (ref 0.0–0.2)

## 2020-11-13 LAB — BASIC METABOLIC PANEL
Anion gap: 10 (ref 5–15)
BUN: 16 mg/dL (ref 6–20)
CO2: 29 mmol/L (ref 22–32)
Calcium: 8.9 mg/dL (ref 8.9–10.3)
Chloride: 98 mmol/L (ref 98–111)
Creatinine, Ser: 1.09 mg/dL (ref 0.61–1.24)
GFR, Estimated: >60 mL/min (ref >60)
Glucose, Bld: 104 mg/dL — ABNORMAL HIGH (ref 70–99)
Potassium: 4.3 mmol/L (ref 3.5–5.1)
Sodium: 137 mmol/L (ref 135–145)

## 2020-11-13 LAB — MAGNESIUM: Magnesium: 1.6 mg/dL — ABNORMAL LOW (ref 1.7–2.4)

## 2020-11-13 MED ORDER — ALTEPLASE 2 MG IJ SOLR
INTRAMUSCULAR | Status: AC
  Filled 2020-11-13: qty 2

## 2020-11-13 MED ORDER — HEPARIN SOD (PORK) LOCK FLUSH 100 UNIT/ML IV SOLN
500.0000 [IU] | Freq: Once | INTRAVENOUS | Status: DC
  Filled 2020-11-13: qty 5

## 2020-11-13 MED ORDER — HEPARIN SOD (PORK) LOCK FLUSH 100 UNIT/ML IV SOLN
500.0000 [IU] | Freq: Once | INTRAVENOUS | Status: AC
  Administered 2020-11-13: 500 [IU] via INTRAVENOUS
  Filled 2020-11-13: qty 5

## 2020-11-13 MED ORDER — ALTEPLASE 2 MG IJ SOLR
2.0000 mg | Freq: Once | INTRAMUSCULAR | Status: AC | PRN
  Administered 2020-11-13: 2 mg
  Filled 2020-11-13: qty 2

## 2020-11-13 MED ORDER — SODIUM CHLORIDE 0.9% FLUSH
10.0000 mL | INTRAVENOUS | Status: DC | PRN
  Filled 2020-11-13: qty 10

## 2020-11-13 MED ORDER — SODIUM CHLORIDE 0.9% FLUSH
10.0000 mL | Freq: Once | INTRAVENOUS | Status: AC
  Administered 2020-11-13: 10 mL via INTRAVENOUS
  Filled 2020-11-13: qty 10

## 2020-11-13 NOTE — Assessment & Plan Note (Signed)
Continue using viscous lidocaine and hydrocodone.

## 2020-11-13 NOTE — Assessment & Plan Note (Signed)
ANC of 1100, Total WBC count of 1600 Neutropenic precautions. Omit C6 of cisplatin

## 2020-11-13 NOTE — Assessment & Plan Note (Signed)
  SCC left tonsil, T3 N3 M0/ Stage III, P16 positive Repeat biopsy suggested poorly differentiated SCC. Given the above information and very locally advanced disease, we decided to proceed with concurrent chemoradiation with weekly cisplatin. C1D1 of cisplatin started on 10/09/2020 C2D1 of cisplatin today, 10/16/2020 C3D1 completed 10/23/2020 C4D1 completed 10/30/2020 C5D1 completed 11/06/2020  ROS today fatigue and mucositis. PE , noted lymphadenopathy 3 * 4 cms left neck. Labs today with leukopenia and neutropenia, so will omit chemo this week. Neutropenic precautions advised. RTC in one week.

## 2020-11-13 NOTE — Progress Notes (Signed)
[facility] NOTE  Patient Care Team: Ladell Pier, MD as PCP - General (Internal Medicine) Melida Quitter, MD as Consulting Physician (Otolaryngology) Eppie Gibson, MD as Consulting Physician (Radiation Oncology) Benay Pike, MD as Consulting Physician (Hematology and Oncology) Malmfelt, Stephani Police, RN as Oncology Nurse Navigator  CHIEF COMPLAINTS/PURPOSE OF CONSULTATION:  New diagnosis of left tonsillar cancer.  ASSESSMENT & PLAN:   Squamous cell carcinoma of left tonsil (HCC)  SCC left tonsil, T3 N3 M0/ Stage III, P16 positive Repeat biopsy suggested poorly differentiated SCC. Given the above information and very locally advanced disease, we decided to proceed with concurrent chemoradiation with weekly cisplatin. C1D1 of cisplatin started on 10/09/2020 C2D1 of cisplatin today, 10/16/2020 C3D1 completed 10/23/2020 C4D1 completed 10/30/2020 C5D1 completed 11/06/2020  ROS today fatigue and mucositis. PE , noted lymphadenopathy 3 * 4 cms left neck. Labs today with leukopenia and neutropenia, so will omit chemo this week. Neutropenic precautions advised. RTC in one week.  Mucositis due to antineoplastic therapy Continue using viscous lidocaine and hydrocodone.  Weight loss, unintentional Weight is down 2 lbs. Encouraged keeping up with calorie intake. He refuses to use the G tube Can drink osmolite and liquids.  Chemotherapy induced neutropenia (HCC) ANC of 1100, Total WBC count of 1600 Neutropenic precautions. Omit C6 of cisplatin  No orders of the defined types were placed in this encounter.  HISTORY OF PRESENTING ILLNESS:   Mitchell Cooper 60 y.o. male is here for follow up of his tonsillar cancer  Imaging and work up done so far.  07/23/2020  IMPRESSION: 1. 2.4 x 4.7 cm mass in the left oropharynx which appears to be arising from the soft palate extending inferiorly. Findings compatible with carcinoma. 2. Large necrotic lymph node  mass in the left neck compatible with metastatic disease. Additional small ill-defined lymph nodes the left could be due to tumor as well. 3. Poor dentition.  08/27/2020  Final Pathologic Diagnosis    A.  LEFT TONSIL, BIOPSY:              Poorly differentiated malignant neoplasm.              See comment.   Electronically signed by Lowella Bandy, MD on 08/13/2020 at 12:10 PM  Comment    This biopsy show the presence of a poorly differentiated malignant neoplasm located at the subepithelial stroma.  Immunostains with appropriate controls show that tumor cells are positive for vimentin and p16, and negative for pancytokeratin (AE1/3), low molecular weight cytokeratin, CK5/6, pan melanoma markers, SOX10, S100, desmin, CD34, CD30  and p40.  CD45, CD3 and CD20 are positive in some background lymphocytes and the tumor cells appear to be negative.  Ki67 is positive in about 40% of the tumor cells.  The findings are consistent with a poorly differentiated malignant neoplasm of undetermined classification.  This tumor shows negative reaction for multiple keratin markers, but a very poorly differentiated carcinoma still can not be entirely ruled.  Because of the negative reaction for multiple markers of melanoma, a malignant melanoma is less likely.  Dr. Sherran Needs also reviewed this case, and believes that the findings in this biopsy are not diagnostic of lymphoma.  A malignant mesenchymal neoplasm is possible, but other types of malignant neoplasm can not be ruled out.  Since this is a relatively small biopsy, additional sampling of the lesion may probably provide additional findings to help further classification of this tumor.  Clinical correlation is recommended.    Dr.  Kleven reviewed this case and agrees with this diagnosis.    08/27/2020  IMPRESSION: 1. Left tonsillar mass is hypermetabolic and consistent with neoplasm. 2. Bulky necrotic left metastatic nodal disease. 3. No findings for metastatic  disease involving the chest, abdomen/pelvis or bony structures.  Interval history  He is here for C6 of cisplatin chemotherapy. He is feeling very tired today. His pain in his mouth has been well controlled with hydrocodone. Neck lymphadenopathy is smaller. He denies any changes in breathing, bowel habits or urinary habits No new neurological complaints. No neuropathy, hearing changes or change in urinary habits. Rest of the pertinent 10 point ROS reviewed and negative.  MEDICAL HISTORY:  Past Medical History:  Diagnosis Date  . Cancer ([place])    Left Tonsillar Cancer   . DVT (deep venous thrombosis) ([place])   . Hypertension 1997   has never taken medications  . Tobacco dependence     SURGICAL HISTORY: Past Surgical History:  Procedure Laterality Date  . IR GASTROSTOMY TUBE MOD SED  09/11/2020  . IR IMAGING GUIDED PORT INSERTION  09/11/2020  . KNEE SURGERY  approx. 1984   reconstructive surgery of left knee; arthroscopic procedure about 1997  . MULTIPLE EXTRACTIONS WITH ALVEOLOPLASTY N/A 09/25/2020   Procedure: MULTIPLE EXTRACTIONS WITH ALVEOLOPLASTY;  Surgeon: Charlaine Dalton, DMD;  Location: [facility];  Service: Dentistry;  Laterality: N/A;  . PERIPHERAL VASCULAR CATHETERIZATION Left 06/16/2016   Procedure: Lower Extremity Venography- Venus Thrombolysis;  Surgeon: Serafina Mitchell, MD;  Location: [facility];  Service: Cardiovascular;  Laterality: Left;    SOCIAL HISTORY: Social History   Socioeconomic History  . Marital status: Married    Spouse name: Not on file  . Number of children: Not on file  . Years of education: Not on file  . Highest education level: Not on file  Occupational History  . Occupation: Architect  Tobacco Use  . Smoking status: Current Some Day Smoker    Types: Cigarettes    Last attempt to quit: 11/21/2016    Years since quitting: 3.9  . Smokeless tobacco: Never Used  Vaping Use  . Vaping Use: Never used  Substance and Sexual Activity   . Alcohol use: Yes    Alcohol/week: 6.0 standard drinks    Types: 6 Cans of beer per week  . Drug use: No  . Sexual activity: Not Currently  Other Topics Concern  . Not on file  Social History Narrative  . Not on file   Social Determinants of Health   Financial Resource Strain: Low Risk   . Difficulty of Paying Living Expenses: Not very hard  Food Insecurity: No Food Insecurity  . Worried About Charity fundraiser in the Last Year: Never true  . Ran Out of Food in the Last Year: Never true  Transportation Needs: No Transportation Needs  . Lack of Transportation (Medical): No  . Lack of Transportation (Non-Medical): No  Physical Activity: Inactive  . Days of Exercise per Week: 0 days  . Minutes of Exercise per Session: 0 min  Stress: No Stress Concern Present  . Feeling of Stress : Only a little  Social Connections: Socially Integrated  . Frequency of Communication with Friends and Family: More than three times a week  . Frequency of Social Gatherings with Friends and Family: More than three times a week  . Attends Religious Services: More than 4 times per year  . Active Member of Clubs or Organizations: Yes  . Attends Club  or Organization Meetings: More than 4 times per year  . Marital Status: Married  Human resources officer Violence: Not on file    FAMILY HISTORY: Family History  Problem Relation Age of Onset  . CAD Mother 76    ALLERGIES:  has No Known Allergies.  MEDICATIONS:  Current Outpatient Medications  Medication Sig Dispense Refill  . amLODipine (NORVASC) 10 MG tablet TAKE 1 TABLET (10 MG TOTAL) BY MOUTH DAILY. 30 tablet 6  . dexamethasone (DECADRON) 4 MG tablet Take 2 tablets (8 mg total) by mouth daily. Take daily x 3 days starting the day after cisplatin chemotherapy. Take with food. 30 tablet 1  . HYDROcodone-acetaminophen (HYCET) 7.5-325 mg/15 ml solution Take 10 mLs by mouth every 6 (six) hours as needed for moderate pain. 120 mL 0  . lidocaine (XYLOCAINE)  2 % solution Patient: Mix 1part 2% viscous lidocaine, 1part H20. Swish & swallow 65m of diluted mixture, 23m before meals and at bedtime, up to QID 200 mL 4  . Lidocaine 2 % GEL Apply to painful skin as directed prn. 28.33 g 3  . lidocaine-prilocaine (EMLA) cream Apply to affected area once 30 g 3  . Nutritional Supplements (FEEDING SUPPLEMENT, OSMOLITE 1.5 CAL,) LIQD 7 cartons of Osmolite 1.5 via PEG with 60 mL free water before and after bolus feedings.(2 cartons 3 times daily and 1 carton once daily) give 45 mL Prosource TF via PEG 3 times daily with 180 mL free water before and after.  Provides 2605 cal and 137 g protein, 2827 mL free water/100% estimated needs. 1659 mL 6  . ondansetron (ZOFRAN) 8 MG tablet Take 1 tablet (8 mg total) by mouth 2 (two) times daily as needed. Start on the third day after cisplatin chemotherapy. 30 tablet 1  . prochlorperazine (COMPAZINE) 10 MG tablet Take 1 tablet (10 mg total) by mouth every 6 (six) hours as needed (Nausea or vomiting). 30 tablet 1  . rivaroxaban (XARELTO) 20 MG TABS tablet TAKE 1 TABLET (20 MG TOTAL) BY MOUTH DAILY WITH SUPPER. 30 tablet 6   No current facility-administered medications for this visit.     PHYSICAL EXAMINATION: ECOG PERFORMANCE STATUS: 0 - Asymptomatic  Vitals:   11/13/20 1003  BP: 117/85  Pulse: 95  Resp: 16  Temp: 98.9 F (37.2 C)  SpO2: 100%   Filed Weights   11/13/20 1003  Weight: 187 lb 6.4 oz (85 kg)    Physical Exam Constitutional:      Appearance: Normal appearance.  HENT:     Head: Normocephalic and atraumatic.     Mouth/Throat:     Mouth: Mucous membranes are moist.     Pharynx: Posterior oropharyngeal erythema present. No oropharyngeal exudate.     Comments: Mucositis roof of mouth  Cardiovascular:     Rate and Rhythm: Normal rate and regular rhythm.     Pulses: Normal pulses.     Heart sounds: Normal heart sounds.  Pulmonary:     Effort: Pulmonary effort is normal.     Breath sounds:  Normal breath sounds.  Abdominal:     General: Abdomen is flat.     Palpations: Abdomen is soft.  Musculoskeletal:        General: No swelling or tenderness.     Cervical back: No rigidity or tenderness.  Lymphadenopathy:     Cervical: Cervical adenopathy (left sided cervical lymphadenopathy measuring about 3 * 4 cms) present.  Neurological:     General: No focal deficit present.  Mental Status: He is alert.  Psychiatric:        Mood and Affect: Mood normal.      LABORATORY DATA:  I have reviewed the data as listed Lab Results  Component Value Date   WBC 1.6 (L) 11/13/2020   HGB 11.5 (L) 11/13/2020   HCT 34.0 (L) 11/13/2020   MCV 79.6 (L) 11/13/2020   PLT 120 (L) 11/13/2020     Chemistry      Component Value Date/Time   NA 137 11/13/2020 0900   NA 139 12/10/2019 1348   K 4.3 11/13/2020 0900   CL 98 11/13/2020 0900   CO2 29 11/13/2020 0900   BUN 16 11/13/2020 0900   BUN 17 12/10/2019 1348   CREATININE 1.09 11/13/2020 0900   CREATININE 1.43 (H) 11/04/2020 1357      Component Value Date/Time   CALCIUM 8.9 11/13/2020 0900   ALKPHOS 72 09/03/2020 1534   AST 12 (L) 09/03/2020 1534   ALT 12 09/03/2020 1534   BILITOT 0.4 09/03/2020 1534      RADIOGRAPHIC STUDIES: I have personally reviewed the radiological images as listed and agreed with the findings in the report. No results found. I independently reviewed PET imaging from outside facility  09/23/2020   Final Pathologic Diagnosis     "TONSIL", BIOPSY: Carcinoma, favor poorly differentiated squamous cell carcinoma. See comment.    Comment    Immunohistochemical stains are performed to characterize the tumor. The tumor cells are positive for EMA (patchy) and p63 (focal); negative for SMA and ERG. These findings, along with the morphologic features and prior immunohistochemical studies (case [id number]), support the diagnosis of carcinoma. A poorly differentiated, p16 positive squamous cell carcinoma is  favored. The control(s) worked appropriately.   Labs from today reviewed, neutropenia and leukopenia.  All questions were answered. The patient knows to call the clinic with any problems, questions or concerns. I spent 30 minutes in the care of this patient including H and P, review of records, counseling and coordination of care.     Benay Pike, MD 11/13/2020 12:45 PM

## 2020-11-13 NOTE — Telephone Encounter (Signed)
Scheduled appt per 4/28 sch msg. Called pt, no answer. Left msg with appt date and time.  

## 2020-11-13 NOTE — Progress Notes (Signed)
Per Dr. Chryl Heck - patient will not be receiving treatment today.

## 2020-11-13 NOTE — Telephone Encounter (Signed)
Patient's chemotherapy was canceled for today.  I contacted patient by telephone for nutrition follow-up.  Patient is receiving treatment for tonsil cancer.  Weight documented as 187.4 pounds decreased from 189.2 pounds on Monday. Noted labs: Glucose 104 magnesium 1.6. Noted his feeding tube came out in the shower and he was able to push it back in.  RN assessed PEG and it was determined the feeding tube is okay for usage. Patient continues to have mild and painful swallowing. He has no taste. He continues to have thick saliva.  He is using baking soda and salt water rinses with good success.[name] He reports he is only tolerating 5-6 cartons of Osmolite 1.5 by mouth.  States he thins it down with water so he can swallow but it is making him to full to tolerate 7 cartons. He reports constipation is controlled.  Estimated nutrition needs: 2400-2600 cal, 120-140 g protein, 2.5 L fluid.  Nutrition diagnosis: Food and nutrition related knowledge deficit continues.  Intervention: Patient can continue Osmolite 1.5 mixed with water by mouth.   Educated him to bring total Osmolite 1.5 up to 7 cartons daily by using his feeding tube for the remaining nutrition.  Asked patient not to thin Osmolite 1.5 with water when using feeding tube.  Did enforce importance of flushing his feeding tube with water before and after. Patient is agreeable and states he will try.  Monitoring, evaluation, goals: Patient will tolerate increased calories and protein to minimize weight loss.  Next visit: Thursday, May 5 during infusion.  **Disclaimer: This note was dictated with voice recognition software. Similar sounding words can inadvertently be transcribed and this note may contain transcription errors which may not have been corrected upon publication of note.**

## 2020-11-13 NOTE — Assessment & Plan Note (Signed)
Weight is down 2 lbs. Encouraged keeping up with calorie intake. He refuses to use the G tube Can drink osmolite and liquids.

## 2020-11-14 ENCOUNTER — Other Ambulatory Visit: Payer: Self-pay | Admitting: Hematology and Oncology

## 2020-11-14 ENCOUNTER — Ambulatory Visit: Payer: Medicaid Other

## 2020-11-15 ENCOUNTER — Inpatient Hospital Stay: Payer: Medicaid Other

## 2020-11-15 ENCOUNTER — Other Ambulatory Visit: Payer: Self-pay

## 2020-11-15 DIAGNOSIS — Z510 Encounter for antineoplastic radiation therapy: Secondary | ICD-10-CM | POA: Diagnosis not present

## 2020-11-15 MED ORDER — MAGNESIUM SULFATE 2 GM/50ML IV SOLN
2.0000 g | Freq: Once | INTRAVENOUS | Status: AC
  Administered 2020-11-15: 2 g via INTRAVENOUS

## 2020-11-15 MED ORDER — SODIUM CHLORIDE 0.9 % IV SOLN
Freq: Once | INTRAVENOUS | Status: AC
Start: 2020-11-15 — End: 2020-11-15
  Filled 2020-11-15: qty 250

## 2020-11-15 MED ORDER — HEPARIN SOD (PORK) LOCK FLUSH 100 UNIT/ML IV SOLN
500.0000 [IU] | Freq: Once | INTRAVENOUS | Status: AC
Start: 2020-11-15 — End: 2020-11-15
  Administered 2020-11-15: 500 [IU] via INTRAVENOUS
  Filled 2020-11-15: qty 5

## 2020-11-15 NOTE — Patient Instructions (Signed)
Magnesium Sulfate injection What is this medicine? MAGNESIUM SULFATE (mag NEE zee um SUL fate) is an electrolyte injection commonly used to treat low magnesium levels in your blood. It is also used to prevent or control seizures in women with preeclampsia or eclampsia. This medicine may be used for other purposes; ask your health care provider or pharmacist if you have questions. What should I tell my health care provider before I take this medicine? They need to know if you have any of these conditions:  heart disease  history of irregular heart beat  kidney disease  an unusual or allergic reaction to magnesium sulfate, medicines, foods, dyes, or preservatives  pregnant or trying to get pregnant  breast-feeding How should I use this medicine? This medicine is for infusion into a vein. It is given by a health care professional in a hospital or clinic setting. Talk to your pediatrician regarding the use of this medicine in children. While this drug may be prescribed for selected conditions, precautions do apply. Overdosage: If you think you have taken too much of this medicine contact a poison control center or emergency room at once. NOTE: This medicine is only for you. Do not share this medicine with others. What if I miss a dose? This does not apply. What may interact with this medicine? This medicine may interact with the following medications:  certain medicines for anxiety or sleep  certain medicines for seizures like phenobarbital  digoxin  medicines that relax muscles for surgery  narcotic medicines for pain This list may not describe all possible interactions. Give your health care provider a list of all the medicines, herbs, non-prescription drugs, or dietary supplements you use. Also tell them if you smoke, drink alcohol, or use illegal drugs. Some items may interact with your medicine. What should I watch for while using this medicine? Your condition will be  monitored carefully while you are receiving this medicine. You may need blood work done while you are receiving this medicine. What side effects may I notice from receiving this medicine? Side effects that you should report to your doctor or health care professional as soon as possible:  allergic reactions like skin rash, itching or hives, swelling of the face, lips, or tongue  facial flushing  muscle weakness  signs and symptoms of low blood pressure like dizziness; feeling faint or lightheaded, falls; unusually weak or tired  signs and symptoms of a dangerous change in heartbeat or heart rhythm like chest pain; dizziness; fast or irregular heartbeat; palpitations; breathing problems  sweating This list may not describe all possible side effects. Call your doctor for medical advice about side effects. You may report side effects to FDA at 1-800-FDA-1088. Where should I keep my medicine? This drug is given in a hospital or clinic and will not be stored at home. NOTE: This sheet is a summary. It may not cover all possible information. If you have questions about this medicine, talk to your doctor, pharmacist, or health care provider.  2021 Elsevier/Gold Standard (2016-01-21 12:31:42)  

## 2020-11-17 ENCOUNTER — Ambulatory Visit
Admission: RE | Admit: 2020-11-17 | Discharge: 2020-11-17 | Disposition: A | Discharge status: Home / Self Care | Payer: Medicaid Other | Source: Ambulatory Visit | Attending: Radiation Oncology | Admitting: Radiation Oncology

## 2020-11-17 ENCOUNTER — Other Ambulatory Visit: Payer: Self-pay

## 2020-11-17 ENCOUNTER — Ambulatory Visit: Payer: Medicaid Other

## 2020-11-17 DIAGNOSIS — Z510 Encounter for antineoplastic radiation therapy: Secondary | ICD-10-CM | POA: Insufficient documentation

## 2020-11-17 DIAGNOSIS — Z8249 Family history of ischemic heart disease and other diseases of the circulatory system: Secondary | ICD-10-CM | POA: Insufficient documentation

## 2020-11-17 DIAGNOSIS — Z86718 Personal history of other venous thrombosis and embolism: Secondary | ICD-10-CM | POA: Insufficient documentation

## 2020-11-17 DIAGNOSIS — Z86711 Personal history of pulmonary embolism: Secondary | ICD-10-CM | POA: Insufficient documentation

## 2020-11-17 DIAGNOSIS — F17210 Nicotine dependence, cigarettes, uncomplicated: Secondary | ICD-10-CM | POA: Insufficient documentation

## 2020-11-17 DIAGNOSIS — K1231 Oral mucositis (ulcerative) due to antineoplastic therapy: Secondary | ICD-10-CM | POA: Insufficient documentation

## 2020-11-17 DIAGNOSIS — Z7952 Long term (current) use of systemic steroids: Secondary | ICD-10-CM | POA: Insufficient documentation

## 2020-11-17 DIAGNOSIS — Z5111 Encounter for antineoplastic chemotherapy: Secondary | ICD-10-CM | POA: Diagnosis not present

## 2020-11-17 DIAGNOSIS — Z7901 Long term (current) use of anticoagulants: Secondary | ICD-10-CM | POA: Diagnosis not present

## 2020-11-17 DIAGNOSIS — C090 Malignant neoplasm of tonsillar fossa: Secondary | ICD-10-CM | POA: Insufficient documentation

## 2020-11-17 DIAGNOSIS — Z79899 Other long term (current) drug therapy: Secondary | ICD-10-CM | POA: Insufficient documentation

## 2020-11-18 ENCOUNTER — Ambulatory Visit
Admission: RE | Admit: 2020-11-18 | Discharge: 2020-11-18 | Disposition: A | Discharge status: Home / Self Care | Payer: Medicaid Other | Source: Ambulatory Visit | Attending: Radiation Oncology | Admitting: Radiation Oncology

## 2020-11-18 DIAGNOSIS — Z510 Encounter for antineoplastic radiation therapy: Secondary | ICD-10-CM | POA: Diagnosis not present

## 2020-11-18 DIAGNOSIS — C090 Malignant neoplasm of tonsillar fossa: Secondary | ICD-10-CM | POA: Diagnosis not present

## 2020-11-19 ENCOUNTER — Other Ambulatory Visit: Payer: Self-pay

## 2020-11-19 ENCOUNTER — Ambulatory Visit
Admission: RE | Admit: 2020-11-19 | Discharge: 2020-11-19 | Disposition: A | Discharge status: Home / Self Care | Payer: Medicaid Other | Source: Ambulatory Visit | Attending: Radiation Oncology | Admitting: Radiation Oncology

## 2020-11-19 DIAGNOSIS — C090 Malignant neoplasm of tonsillar fossa: Secondary | ICD-10-CM | POA: Diagnosis not present

## 2020-11-19 DIAGNOSIS — Z510 Encounter for antineoplastic radiation therapy: Secondary | ICD-10-CM | POA: Diagnosis not present

## 2020-11-20 ENCOUNTER — Inpatient Hospital Stay: Payer: Medicaid Other

## 2020-11-20 ENCOUNTER — Other Ambulatory Visit: Payer: Medicaid Other

## 2020-11-20 ENCOUNTER — Ambulatory Visit
Admission: RE | Admit: 2020-11-20 | Discharge: 2020-11-20 | Disposition: A | Discharge status: Home / Self Care | Payer: Medicaid Other | Source: Ambulatory Visit | Attending: Radiation Oncology | Admitting: Radiation Oncology

## 2020-11-20 ENCOUNTER — Inpatient Hospital Stay: Payer: Medicaid Other | Admitting: Nutrition

## 2020-11-20 ENCOUNTER — Encounter: Payer: Self-pay | Admitting: Hematology and Oncology

## 2020-11-20 ENCOUNTER — Inpatient Hospital Stay (HOSPITAL_BASED_OUTPATIENT_CLINIC_OR_DEPARTMENT_OTHER): Payer: Medicaid Other | Admitting: Hematology and Oncology

## 2020-11-20 VITALS — BP 120/87 | HR 99 | Temp 96.8°F | Resp 18 | Ht 69.0 in | Wt 182.6 lb

## 2020-11-20 DIAGNOSIS — C099 Malignant neoplasm of tonsil, unspecified: Secondary | ICD-10-CM

## 2020-11-20 DIAGNOSIS — Z7952 Long term (current) use of systemic steroids: Secondary | ICD-10-CM | POA: Insufficient documentation

## 2020-11-20 DIAGNOSIS — Z86718 Personal history of other venous thrombosis and embolism: Secondary | ICD-10-CM | POA: Insufficient documentation

## 2020-11-20 DIAGNOSIS — R634 Abnormal weight loss: Secondary | ICD-10-CM | POA: Diagnosis not present

## 2020-11-20 DIAGNOSIS — Z8249 Family history of ischemic heart disease and other diseases of the circulatory system: Secondary | ICD-10-CM | POA: Insufficient documentation

## 2020-11-20 DIAGNOSIS — K1231 Oral mucositis (ulcerative) due to antineoplastic therapy: Secondary | ICD-10-CM | POA: Insufficient documentation

## 2020-11-20 DIAGNOSIS — Z86711 Personal history of pulmonary embolism: Secondary | ICD-10-CM | POA: Insufficient documentation

## 2020-11-20 DIAGNOSIS — F17210 Nicotine dependence, cigarettes, uncomplicated: Secondary | ICD-10-CM | POA: Insufficient documentation

## 2020-11-20 DIAGNOSIS — Z79899 Other long term (current) drug therapy: Secondary | ICD-10-CM | POA: Insufficient documentation

## 2020-11-20 DIAGNOSIS — Z510 Encounter for antineoplastic radiation therapy: Secondary | ICD-10-CM | POA: Diagnosis not present

## 2020-11-20 DIAGNOSIS — Z7901 Long term (current) use of anticoagulants: Secondary | ICD-10-CM | POA: Insufficient documentation

## 2020-11-20 DIAGNOSIS — Z5111 Encounter for antineoplastic chemotherapy: Secondary | ICD-10-CM | POA: Insufficient documentation

## 2020-11-20 DIAGNOSIS — Z95828 Presence of other vascular implants and grafts: Secondary | ICD-10-CM

## 2020-11-20 DIAGNOSIS — C090 Malignant neoplasm of tonsillar fossa: Secondary | ICD-10-CM | POA: Diagnosis not present

## 2020-11-20 LAB — CBC WITH DIFFERENTIAL/PLATELET
Abs Immature Granulocytes: 0.04 10*3/uL (ref 0.00–0.07)
Basophils Absolute: 0 10*3/uL (ref 0.0–0.1)
Basophils Relative: 1 %
Eosinophils Absolute: 0 10*3/uL (ref 0.0–0.5)
Eosinophils Relative: 0 %
HCT: 31.2 % — ABNORMAL LOW (ref 39.0–52.0)
Hemoglobin: 10.5 g/dL — ABNORMAL LOW (ref 13.0–17.0)
Immature Granulocytes: 1 %
Lymphocytes Relative: 14 %
Lymphs Abs: 0.5 10*3/uL — ABNORMAL LOW (ref 0.7–4.0)
MCH: 26.9 pg (ref 26.0–34.0)
MCHC: 33.7 g/dL (ref 30.0–36.0)
MCV: 80 fL (ref 80.0–100.0)
Monocytes Absolute: 0.7 10*3/uL (ref 0.1–1.0)
Monocytes Relative: 20 %
Neutro Abs: 2.1 10*3/uL (ref 1.7–7.7)
Neutrophils Relative %: 64 %
Platelets: 200 10*3/uL (ref 150–400)
RBC: 3.9 MIL/uL — ABNORMAL LOW (ref 4.22–5.81)
RDW: 17.2 % — ABNORMAL HIGH (ref 11.5–15.5)
WBC: 3.3 10*3/uL — ABNORMAL LOW (ref 4.0–10.5)
nRBC: 0 % (ref 0.0–0.2)

## 2020-11-20 LAB — BASIC METABOLIC PANEL
Anion gap: 11 (ref 5–15)
BUN: 20 mg/dL (ref 6–20)
CO2: 28 mmol/L (ref 22–32)
Calcium: 9.3 mg/dL (ref 8.9–10.3)
Chloride: 97 mmol/L — ABNORMAL LOW (ref 98–111)
Creatinine, Ser: 1.32 mg/dL — ABNORMAL HIGH (ref 0.61–1.24)
GFR, Estimated: >60 mL/min (ref >60)
Glucose, Bld: 97 mg/dL (ref 70–99)
Potassium: 4.1 mmol/L (ref 3.5–5.1)
Sodium: 136 mmol/L (ref 135–145)

## 2020-11-20 LAB — MAGNESIUM: Magnesium: 1.8 mg/dL (ref 1.7–2.4)

## 2020-11-20 MED ORDER — SODIUM CHLORIDE 0.9 % IV SOLN
Freq: Once | INTRAVENOUS | Status: AC
  Filled 2020-11-20: qty 250

## 2020-11-20 MED ORDER — PALONOSETRON HCL INJECTION 0.25 MG/5ML
INTRAVENOUS | Status: AC
  Filled 2020-11-20: qty 5

## 2020-11-20 MED ORDER — SODIUM CHLORIDE 0.9% FLUSH
10.0000 mL | Freq: Once | INTRAVENOUS | Status: AC
Start: 2020-11-20 — End: 2020-11-20
  Administered 2020-11-20: 10 mL
  Filled 2020-11-20: qty 10

## 2020-11-20 MED ORDER — MAGNESIUM SULFATE 2 GM/50ML IV SOLN
2.0000 g | Freq: Once | INTRAVENOUS | Status: AC
  Administered 2020-11-20: 2 g via INTRAVENOUS

## 2020-11-20 MED ORDER — POTASSIUM CHLORIDE IN NACL 20-0.9 MEQ/L-% IV SOLN
Freq: Once | INTRAVENOUS | Status: AC
Start: 2020-11-20 — End: 2020-11-20
  Filled 2020-11-20: qty 1000

## 2020-11-20 MED ORDER — ACETAMINOPHEN 325 MG PO TABS
ORAL_TABLET | ORAL | Status: AC
  Filled 2020-11-20: qty 2

## 2020-11-20 MED ORDER — ACETAMINOPHEN 325 MG PO TABS
650.0000 mg | ORAL_TABLET | Freq: Once | ORAL | Status: AC
Start: 2020-11-20 — End: 2020-11-20
  Administered 2020-11-20: 650 mg via ORAL

## 2020-11-20 MED ORDER — SODIUM CHLORIDE 0.9 % IV SOLN
40.0000 mg/m2 | Freq: Once | INTRAVENOUS | Status: AC
  Administered 2020-11-20: 88 mg via INTRAVENOUS
  Filled 2020-11-20: qty 88

## 2020-11-20 MED ORDER — PALONOSETRON HCL INJECTION 0.25 MG/5ML
0.2500 mg | Freq: Once | INTRAVENOUS | Status: AC
  Administered 2020-11-20: 0.25 mg via INTRAVENOUS

## 2020-11-20 MED ORDER — SODIUM CHLORIDE 0.9 % IV SOLN
150.0000 mg | Freq: Once | INTRAVENOUS | Status: AC
  Administered 2020-11-20: 150 mg via INTRAVENOUS
  Filled 2020-11-20: qty 150

## 2020-11-20 MED ORDER — SODIUM CHLORIDE 0.9% FLUSH
10.0000 mL | INTRAVENOUS | Status: DC | PRN
  Administered 2020-11-20: 10 mL
  Filled 2020-11-20: qty 10

## 2020-11-20 MED ORDER — SODIUM CHLORIDE 0.9 % IV SOLN
10.0000 mg | Freq: Once | INTRAVENOUS | Status: AC
  Administered 2020-11-20: 10 mg via INTRAVENOUS
  Filled 2020-11-20: qty 10

## 2020-11-20 MED ORDER — MAGNESIUM SULFATE 2 GM/50ML IV SOLN
INTRAVENOUS | Status: AC
  Filled 2020-11-20: qty 50

## 2020-11-20 MED ORDER — HEPARIN SOD (PORK) LOCK FLUSH 100 UNIT/ML IV SOLN
500.0000 [IU] | Freq: Once | INTRAVENOUS | Status: AC | PRN
  Administered 2020-11-20: 500 [IU]
  Filled 2020-11-20: qty 5

## 2020-11-20 NOTE — Progress Notes (Signed)
Nutrition follow-up completed with patient for tonsil cancer.  Patient finishes radiation therapy next week. Weight decreased and documented as 182.6 pounds May 5 down from 187.4 pounds April 28. Noted labs: Creatinine 1.32 and magnesium 1.8. Patient continues to drink Osmolite 1.5 by mouth without difficulty.  Reports he has no taste so it is not difficult.  He has not had anything yet today so I provided him with 1 carton of Ensure complete to drink by mouth. Patient denies nausea, vomiting, constipation, and diarrhea.  Estimated nutrition needs: 2400-2600 cal, 120-140 g protein, 2.5 L fluid.  Nutrition diagnosis: Food and nutrition related knowledge deficit continues.  Intervention: Increase Osmolite 1.5-7 cartons daily either by mouth or via tube.  Recommended free water by mouth as tolerated. I will consider changing one carton of Osmolite 1.5 to Ensure Complete to provide additional 15 grams protein. Enforced importance of continued flushing of feeding tube daily. Patient feels he will be able to comply.  7 cartons Osmolite 1.5 provides 2485 cal and 104 g protein  Monitoring, evaluation, goals: Patient will work to increase calories and protein to meet greater than 90% estimated calorie needs.  Next visit: To be scheduled by phone.  **Disclaimer: This note was dictated with voice recognition software. Similar sounding words can inadvertently be transcribed and this note may contain transcription errors which may not have been corrected upon publication of note.**

## 2020-11-20 NOTE — Progress Notes (Signed)
Cisplatin dose confirmed w/ Dr. Chryl Heck today.  Kennith Center, Pharm.D., CPP 11/20/2020@11 :11 AM

## 2020-11-20 NOTE — Patient Instructions (Signed)
[facility]  Discharge Instructions: Thank you for choosing [facility] to provide your oncology and hematology care.   If you have a lab appointment with the Cancer Center, please go directly to the Cancer Center and check in at the registration area.   Wear comfortable clothing and clothing appropriate for easy access to any Portacath or PICC line.   We strive to give you quality time with your provider. You may need to reschedule your appointment if you arrive late (15 or more minutes).  Arriving late affects you and other patients whose appointments are after yours.  Also, if you miss three or more appointments without notifying the office, you may be dismissed from the clinic at the provider's discretion.      For prescription refill requests, have your pharmacy contact our office and allow 72 hours for refills to be completed.    Today you received the following chemotherapy and/or immunotherapy agents cisplatin   To help prevent nausea and vomiting after your treatment, we encourage you to take your nausea medication as directed.  BELOW ARE SYMPTOMS THAT SHOULD BE REPORTED IMMEDIATELY: *FEVER GREATER THAN 100.4 F (38 C) OR HIGHER *CHILLS OR SWEATING *NAUSEA AND VOMITING THAT IS NOT CONTROLLED WITH YOUR NAUSEA MEDICATION *UNUSUAL SHORTNESS OF BREATH *UNUSUAL BRUISING OR BLEEDING *URINARY PROBLEMS (pain or burning when urinating, or frequent urination) *BOWEL PROBLEMS (unusual diarrhea, constipation, pain near the anus) TENDERNESS IN MOUTH AND THROAT WITH OR WITHOUT PRESENCE OF ULCERS (sore throat, sores in mouth, or a toothache) UNUSUAL RASH, SWELLING OR PAIN  UNUSUAL VAGINAL DISCHARGE OR ITCHING   Items with * indicate a potential emergency and should be followed up as soon as possible or go to the Emergency Department if any problems should occur.  Please show the CHEMOTHERAPY ALERT CARD or IMMUNOTHERAPY ALERT CARD at check-in to the  Emergency Department and triage nurse.  Should you have questions after your visit or need to cancel or reschedule your appointment, please contact [facility]  Dept: [phone]  and follow the prompts.  Office hours are 8:00 a.m. to 4:30 p.m. Monday - Friday. Please note that voicemails left after 4:00 p.m. may not be returned until the following business day.  We are closed weekends and major holidays. You have access to a nurse at all times for urgent questions. Please call the main number to the clinic Dept: [phone] and follow the prompts.   For any non-urgent questions, you may also contact your provider using MyChart. We now offer e-Visits for anyone 18 and older to request care online for non-urgent symptoms. For details visit [email or web address].   Also download the MyChart app! Go to the app store, search "MyChart", open the app, select [facility], and log in with your MyChart username and password.  Due to Covid, a mask is required upon entering the hospital/clinic. If you do not have a mask, one will be given to you upon arrival. For doctor visits, patients may have 1 support person aged 18 or older with them. For treatment visits, patients cannot have anyone with them due to current Covid guidelines and our immunocompromised population.   

## 2020-11-20 NOTE — Assessment & Plan Note (Signed)
Lost about 5 lbs since last visit Refuses to use G tube, since he is drinking 6 cans of supplement by mouth. Since this is his last treatment he is hoping to gain some weight back in the next few weeks.

## 2020-11-20 NOTE — Assessment & Plan Note (Signed)
Severe on exam Continue Hydrocodone as needed along with viscous lidocaine. He will call us refill when needed.

## 2020-11-20 NOTE — Progress Notes (Signed)
[facility] NOTE  Patient Care Team: Ladell Pier, MD as PCP - General (Internal Medicine) Melida Quitter, MD as Consulting Physician (Otolaryngology) Eppie Gibson, MD as Consulting Physician (Radiation Oncology) Benay Pike, MD as Consulting Physician (Hematology and Oncology) Malmfelt, Stephani Police, RN as Oncology Nurse Navigator  CHIEF COMPLAINTS/PURPOSE OF CONSULTATION:  New diagnosis of left tonsillar cancer.  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 Given the above information and very locally advanced disease, we decided to proceed with concurrent chemoradiation with weekly cisplatin.  C1D1 of cisplatin started on 10/09/2020 C2D1 of cisplatin today, 10/16/2020 C3D1 completed 10/23/2020 C4D1 completed 10/30/2020 C5D1 completed 11/06/2020  He is doing well.  Anticipating C6 today, treatment delayed by a week due to neutropenia. ANC of 2.1 today, ok to proceed with treatment. He will RTC in a month from now.  Weight loss, unintentional Lost about 5 lbs since last visit Refuses to use G tube, since he is drinking 6 cans of supplement by mouth. Since this is his last treatment he is hoping to gain some weight back in the next few weeks.   History of pulmonary embolus (PE) No symptoms or signs concerning for DVT/PE Ok to continue Xarelto.  Mucositis due to antineoplastic therapy Severe on exam Continue Hydrocodone as needed along with viscous lidocaine. He will call us refill when needed.  No orders of the defined types were placed in this encounter.  HISTORY OF PRESENTING ILLNESS:   Mitchell Cooper 60 y.o. male is here for follow up of his tonsillar cancer  Oncology History Overview Note   Imaging and work up done so far.  07/23/2020  IMPRESSION: 1. 2.4 x 4.7 cm mass in the left oropharynx which appears to  be arising from the soft palate extending inferiorly. Findings compatible with carcinoma. 2. Large necrotic lymph node mass in the left neck compatible with metastatic disease. Additional small ill-defined lymph nodes the left could be due to tumor as well. 3. Poor dentition.  08/27/2020  Final Pathologic Diagnosis    A.  LEFT TONSIL, BIOPSY:              Poorly differentiated malignant neoplasm.              See comment.    Electronically signed by Lowella Bandy, MD on 08/13/2020 at 12:10 PM  Comment    This biopsy show the presence of a poorly differentiated malignant neoplasm located at the subepithelial stroma.  Immunostains with appropriate controls show that tumor cells are positive for vimentin and p16, and negative for pancytokeratin (AE1/3), low molecular weight cytokeratin, CK5/6, pan melanoma markers, SOX10, S100, desmin, CD34, CD30  and p40.  CD45, CD3 and CD20 are positive in some background lymphocytes and the tumor cells appear to be negative.  Ki67 is positive in about 40% of the tumor cells.   The findings are consistent with a poorly differentiated malignant neoplasm of undetermined classification.  This tumor shows negative reaction for multiple keratin markers, but a very poorly differentiated carcinoma still can not be entirely ruled.  Because of the negative reaction for multiple markers of melanoma, a malignant melanoma is less likely.  Dr. Sherran Needs also reviewed this case, and believes that the findings in this biopsy are not diagnostic of lymphoma.  A malignant mesenchymal neoplasm is possible, but other types of malignant neoplasm can not be  ruled out.   Since this is a relatively small biopsy, additional sampling of the lesion may probably provide additional findings to help further classification of this tumor.  Clinical correlation is recommended.     Dr. Tommas Olp reviewed this case and agrees with this diagnosis.    08/27/2020  IMPRESSION: 1. Left tonsillar mass is  hypermetabolic and consistent with neoplasm. 2. Bulky necrotic left metastatic nodal disease. 3. No findings for metastatic disease involving the chest, abdomen/pelvis or bony structures.   Squamous cell carcinoma of left tonsil ([place])  09/03/2020 Initial Diagnosis   Squamous cell carcinoma of left tonsil ([place])   09/09/2020 Cancer Staging   Staging form: Pharynx - HPV-Mediated Oropharynx, AJCC 8th Edition - Clinical stage from 09/09/2020: Stage III (cT3, cN3, cM0, p16+) - Signed by Eppie Gibson, MD on 09/12/2020 Stage prefix: Initial diagnosis   09/17/2020 - 09/17/2020 Chemotherapy         10/09/2020 -  Chemotherapy    Patient is on Treatment Plan: HEAD/NECK CISPLATIN Q7D       Interval history  He is doing well except for some headache. He was hoping he could get some tylenol in the infusion. He has sore throat, tries to use minimal amount of hydrocodone, a couple times a day or less. He is managing with mouthwash. He drinks 6 cans of supplements, he refuses to use the G tube. He denies any change in breathing Occasional constipation One episode of ringing noise in left ear. No change in bowel habits or urinary habits. No new neurological complaints.  MEDICAL HISTORY:  Past Medical History:  Diagnosis Date  . Cancer ([place])    Left Tonsillar Cancer   . DVT (deep venous thrombosis) ([place])   . Hypertension 1997   has never taken medications  . Tobacco dependence     SURGICAL HISTORY: Past Surgical History:  Procedure Laterality Date  . IR GASTROSTOMY TUBE MOD SED  09/11/2020  . IR IMAGING GUIDED PORT INSERTION  09/11/2020  . KNEE SURGERY  approx. 1984   reconstructive surgery of left knee; arthroscopic procedure about 1997  . MULTIPLE EXTRACTIONS WITH ALVEOLOPLASTY N/A 09/25/2020   Procedure: MULTIPLE EXTRACTIONS WITH ALVEOLOPLASTY;  Surgeon: Charlaine Dalton, DMD;  Location: [facility];  Service: Dentistry;  Laterality: N/A;  . PERIPHERAL VASCULAR CATHETERIZATION Left  06/16/2016   Procedure: Lower Extremity Venography- Venus Thrombolysis;  Surgeon: Serafina Mitchell, MD;  Location: [facility];  Service: Cardiovascular;  Laterality: Left;    SOCIAL HISTORY: Social History   Socioeconomic History  . Marital status: Married    Spouse name: Not on file  . Number of children: Not on file  . Years of education: Not on file  . Highest education level: Not on file  Occupational History  . Occupation: Architect  Tobacco Use  . Smoking status: Current Some Day Smoker    Types: Cigarettes    Last attempt to quit: 11/21/2016    Years since quitting: 4.0  . Smokeless tobacco: Never Used  Vaping Use  . Vaping Use: Never used  Substance and Sexual Activity  . Alcohol use: Yes    Alcohol/week: 6.0 standard drinks    Types: 6 Cans of beer per week  . Drug use: No  . Sexual activity: Not Currently  Other Topics Concern  . Not on file  Social History Narrative  . Not on file   Social Determinants of Health   Financial Resource Strain: Low Risk   . Difficulty of Paying Living  Expenses: Not very hard  Food Insecurity: No Food Insecurity  . Worried About Charity fundraiser in the Last Year: Never true  . Ran Out of Food in the Last Year: Never true  Transportation Needs: No Transportation Needs  . Lack of Transportation (Medical): No  . Lack of Transportation (Non-Medical): No  Physical Activity: Inactive  . Days of Exercise per Week: 0 days  . Minutes of Exercise per Session: 0 min  Stress: No Stress Concern Present  . Feeling of Stress : Only a little  Social Connections: Socially Integrated  . Frequency of Communication with Friends and Family: More than three times a week  . Frequency of Social Gatherings with Friends and Family: More than three times a week  . Attends Religious Services: More than 4 times per year  . Active Member of Clubs or Organizations: Yes  . Attends Archivist Meetings: More than 4 times per year  .  Marital Status: Married  Human resources officer Violence: Not on file    FAMILY HISTORY: Family History  Problem Relation Age of Onset  . CAD Mother 81    ALLERGIES:  has No Known Allergies.  MEDICATIONS:  Current Outpatient Medications  Medication Sig Dispense Refill  . amLODipine (NORVASC) 10 MG tablet TAKE 1 TABLET (10 MG TOTAL) BY MOUTH DAILY. 30 tablet 6  . dexamethasone (DECADRON) 4 MG tablet Take 2 tablets (8 mg total) by mouth daily. Take daily x 3 days starting the day after cisplatin chemotherapy. Take with food. 30 tablet 1  . HYDROcodone-acetaminophen (HYCET) 7.5-325 mg/15 ml solution Take 10 mLs by mouth every 6 (six) hours as needed for moderate pain. 120 mL 0  . lidocaine (XYLOCAINE) 2 % solution Patient: Mix 1part 2% viscous lidocaine, 1part H20. Swish & swallow 63m of diluted mixture, 269m before meals and at bedtime, up to QID 200 mL 4  . Lidocaine 2 % GEL Apply to painful skin as directed prn. 28.33 g 3  . lidocaine-prilocaine (EMLA) cream Apply to affected area once 30 g 3  . Nutritional Supplements (FEEDING SUPPLEMENT, OSMOLITE 1.5 CAL,) LIQD 7 cartons of Osmolite 1.5 via PEG with 60 mL free water before and after bolus feedings.(2 cartons 3 times daily and 1 carton once daily) give 45 mL Prosource TF via PEG 3 times daily with 180 mL free water before and after.  Provides 2605 cal and 137 g protein, 2827 mL free water/100% estimated needs. 1659 mL 6  . ondansetron (ZOFRAN) 8 MG tablet Take 1 tablet (8 mg total) by mouth 2 (two) times daily as needed. Start on the third day after cisplatin chemotherapy. 30 tablet 1  . prochlorperazine (COMPAZINE) 10 MG tablet Take 1 tablet (10 mg total) by mouth every 6 (six) hours as needed (Nausea or vomiting). 30 tablet 1  . rivaroxaban (XARELTO) 20 MG TABS tablet TAKE 1 TABLET (20 MG TOTAL) BY MOUTH DAILY WITH SUPPER. 30 tablet 6   No current facility-administered medications for this visit.   Facility-Administered Medications Ordered  in Other Visits  Medication Dose Route Frequency Provider Last Rate Last Admin  . 0.9 % NaCl with KCl 20 mEq/ L  infusion   Intravenous Once Micaella Gitto, PrArletha PiliMD      . acetaminophen (TYLENOL) tablet 650 mg  650 mg Oral Once Akiyah Eppolito, PrArletha PiliMD      . CISplatin (PLATINOL) 88 mg in sodium chloride 0.9 % 250 mL chemo infusion  40 mg/m2 (Treatment Plan Recorded) Intravenous Once Demitrus Francisco,  MD      . dexamethasone (DECADRON) 10 mg in sodium chloride 0.9 % 50 mL IVPB  10 mg Intravenous Once , , MD      . fosaprepitant (EMEND) 150 mg in sodium chloride 0.9 % 145 mL IVPB  150 mg Intravenous Once , , MD      . heparin lock flush 100 unit/mL  500 Units Intracatheter Once PRN , , MD      . magnesium sulfate IVPB 2 g 50 mL  2 g Intravenous Once , , MD      . palonosetron (ALOXI) injection 0.25 mg  0.25 mg Intravenous Once , , MD      . sodium chloride flush (NS) 0.9 % injection 10 mL  10 mL Intracatheter PRN , Arletha Pili, MD         PHYSICAL EXAMINATION: ECOG PERFORMANCE STATUS: 0 - Asymptomatic  Vitals:   11/20/20 0919 11/20/20 0928  BP: 120/87   Pulse: (!) 106 99  Resp: 18   Temp: (!) 96.8 F (36 C)   SpO2: 100% 100%   Filed Weights   11/20/20 0919  Weight: 182 lb 9.6 oz (82.8 kg)    Physical Exam Constitutional:      Appearance: Normal appearance.  HENT:     Head: Normocephalic and atraumatic.     Mouth/Throat:     Mouth: Mucous membranes are moist.     Pharynx: Oropharyngeal exudate and posterior oropharyngeal erythema present.     Comments: Mucositis roof of mouth  Cardiovascular:     Rate and Rhythm: Normal rate and regular rhythm.     Pulses: Normal pulses.     Heart sounds: Normal heart sounds.  Pulmonary:     Effort: Pulmonary effort is normal.     Breath sounds: Normal breath sounds.  Abdominal:     General: Abdomen is flat.     Palpations: Abdomen is soft.  Musculoskeletal:        General: No  swelling or tenderness.     Cervical back: No rigidity or tenderness.  Lymphadenopathy:     Cervical: Cervical adenopathy (left sided cervical lymphadenopathy measuring about 3 cms, more mobile,) present.  Neurological:     General: No focal deficit present.     Mental Status: He is alert.  Psychiatric:        Mood and Affect: Mood normal.      LABORATORY DATA:  I have reviewed the data as listed Lab Results  Component Value Date   WBC 3.3 (L) 11/20/2020   HGB 10.5 (L) 11/20/2020   HCT 31.2 (L) 11/20/2020   MCV 80.0 11/20/2020   PLT 200 11/20/2020     Chemistry      Component Value Date/Time   NA 136 11/20/2020 0906   NA 139 12/10/2019 1348   K 4.1 11/20/2020 0906   CL 97 (L) 11/20/2020 0906   CO2 28 11/20/2020 0906   BUN 20 11/20/2020 0906   BUN 17 12/10/2019 1348   CREATININE 1.32 (H) 11/20/2020 0906   CREATININE 1.43 (H) 11/04/2020 1357      Component Value Date/Time   CALCIUM 9.3 11/20/2020 0906   ALKPHOS 72 09/03/2020 1534   AST 12 (L) 09/03/2020 1534   ALT 12 09/03/2020 1534   BILITOT 0.4 09/03/2020 1534      RADIOGRAPHIC STUDIES: I have personally reviewed the radiological images as listed and agreed with the findings in the report. No results found.  Labs reviewed, Leukopenia, ANC of 2.1, GFR  more than 60. Ok to proceed with treatment as planned  All questions were answered. The patient knows to call the clinic with any problems, questions or concerns.    Benay Pike, MD 11/20/2020 10:28 AM

## 2020-11-20 NOTE — Assessment & Plan Note (Addendum)
SCC left tonsil, T3 N3 M0/ Stage III, P16 positive Repeat biopsy suggested poorly differentiated SCC. Given the above information and very locally advanced disease, we decided to proceed with concurrent chemoradiation with weekly cisplatin.  C1D1 of cisplatin started on 10/09/2020 C2D1 of cisplatin today, 10/16/2020 C3D1 completed 10/23/2020 C4D1 completed 10/30/2020 C5D1 completed 11/06/2020  He is doing well.  Anticipating C6 today, treatment delayed by a week due to neutropenia. ANC of 2.1 today, ok to proceed with treatment. He will RTC in a month from now.

## 2020-11-20 NOTE — Assessment & Plan Note (Signed)
No symptoms or signs concerning for DVT/PE Ok to continue Xarelto.

## 2020-11-21 ENCOUNTER — Ambulatory Visit
Admission: RE | Admit: 2020-11-21 | Discharge: 2020-11-21 | Disposition: A | Discharge status: Home / Self Care | Payer: Medicaid Other | Source: Ambulatory Visit | Attending: Radiation Oncology | Admitting: Radiation Oncology

## 2020-11-21 ENCOUNTER — Telehealth: Payer: Self-pay | Admitting: Hematology and Oncology

## 2020-11-21 ENCOUNTER — Other Ambulatory Visit: Payer: Self-pay

## 2020-11-21 DIAGNOSIS — Z510 Encounter for antineoplastic radiation therapy: Secondary | ICD-10-CM | POA: Diagnosis not present

## 2020-11-21 DIAGNOSIS — C090 Malignant neoplasm of tonsillar fossa: Secondary | ICD-10-CM | POA: Diagnosis not present

## 2020-11-21 NOTE — Telephone Encounter (Signed)
Scheduled appt per 5/6 sch msg. Called pt, no answer. Left msg with appt date and time.  

## 2020-11-24 ENCOUNTER — Other Ambulatory Visit: Payer: Self-pay

## 2020-11-24 ENCOUNTER — Ambulatory Visit
Admission: RE | Admit: 2020-11-24 | Discharge: 2020-11-24 | Disposition: A | Discharge status: Home / Self Care | Payer: Medicaid Other | Source: Ambulatory Visit | Attending: Radiation Oncology | Admitting: Radiation Oncology

## 2020-11-24 DIAGNOSIS — Z510 Encounter for antineoplastic radiation therapy: Secondary | ICD-10-CM | POA: Diagnosis not present

## 2020-11-24 DIAGNOSIS — C090 Malignant neoplasm of tonsillar fossa: Secondary | ICD-10-CM | POA: Diagnosis not present

## 2020-11-25 ENCOUNTER — Ambulatory Visit: Payer: Medicaid Other

## 2020-11-25 ENCOUNTER — Ambulatory Visit
Admission: RE | Admit: 2020-11-25 | Discharge: 2020-11-25 | Disposition: A | Discharge status: Home / Self Care | Payer: Medicaid Other | Source: Ambulatory Visit | Attending: Radiation Oncology | Admitting: Radiation Oncology

## 2020-11-25 DIAGNOSIS — C090 Malignant neoplasm of tonsillar fossa: Secondary | ICD-10-CM | POA: Diagnosis not present

## 2020-11-25 DIAGNOSIS — Z510 Encounter for antineoplastic radiation therapy: Secondary | ICD-10-CM | POA: Diagnosis not present

## 2020-11-26 ENCOUNTER — Encounter: Payer: Self-pay | Admitting: Radiation Oncology

## 2020-11-26 ENCOUNTER — Other Ambulatory Visit: Payer: Self-pay

## 2020-11-26 ENCOUNTER — Ambulatory Visit
Admission: RE | Admit: 2020-11-26 | Discharge: 2020-11-26 | Disposition: A | Discharge status: Home / Self Care | Payer: Medicaid Other | Source: Ambulatory Visit | Attending: Radiation Oncology | Admitting: Radiation Oncology

## 2020-11-26 DIAGNOSIS — Z510 Encounter for antineoplastic radiation therapy: Secondary | ICD-10-CM | POA: Diagnosis not present

## 2020-11-26 DIAGNOSIS — C090 Malignant neoplasm of tonsillar fossa: Secondary | ICD-10-CM | POA: Diagnosis not present

## 2020-11-26 NOTE — Progress Notes (Signed)
Oncology Nurse Navigator Documentation  Met with Mr. Lagos after final RT to offer support and to celebrate end of radiation treatment.   Provided verbal/written post-RT guidance:  Importance of keeping all follow-up appts, especially those with Nutrition and SLP. I will notify [facility] office that Mr. Gilliand has completed his radiation and needs to be scheduled.  Importance of protecting treatment area from sun.  Continuation of Sonafine application 2-3 times daily, application of antibiotic ointment to areas of raw skin; when supply of Sonafine exhausted transition to OTC lotion with vitamin E. Provided/reviewed Epic calendar of upcoming appts. Explained my role as navigator will continue for several more months, encouraged him to call me with needs/concerns.    Harlow Asa RN, BSN, OCN Head & Neck Oncology Nurse [place] [facility] Phone # [phone]  Fax # [phone]

## 2020-11-27 ENCOUNTER — Telehealth: Payer: Self-pay | Admitting: Nutrition

## 2020-11-27 NOTE — Telephone Encounter (Signed)
Scheduled appt per 5/12 sch msg. Called pt, no answer. Left msg with appt date and time.

## 2020-12-02 ENCOUNTER — Other Ambulatory Visit (HOSPITAL_COMMUNITY): Payer: Self-pay

## 2020-12-02 MED FILL — Rivaroxaban Tab 20 MG: ORAL | 30 days supply | Qty: 30 | Fill #1 | Status: AC

## 2020-12-08 ENCOUNTER — Encounter: Payer: Self-pay | Admitting: Internal Medicine

## 2020-12-08 ENCOUNTER — Other Ambulatory Visit: Payer: Self-pay

## 2020-12-08 ENCOUNTER — Ambulatory Visit: Payer: Medicaid Other | Attending: Internal Medicine | Admitting: Internal Medicine

## 2020-12-08 VITALS — BP 116/81 | HR 102 | Resp 16 | Ht 69.0 in | Wt 177.4 lb

## 2020-12-08 DIAGNOSIS — I50810 Right heart failure, unspecified: Secondary | ICD-10-CM

## 2020-12-08 DIAGNOSIS — R634 Abnormal weight loss: Secondary | ICD-10-CM | POA: Diagnosis not present

## 2020-12-08 DIAGNOSIS — D6859 Other primary thrombophilia: Secondary | ICD-10-CM

## 2020-12-08 DIAGNOSIS — Z931 Gastrostomy status: Secondary | ICD-10-CM | POA: Diagnosis not present

## 2020-12-08 DIAGNOSIS — Z87891 Personal history of nicotine dependence: Secondary | ICD-10-CM

## 2020-12-08 DIAGNOSIS — C099 Malignant neoplasm of tonsil, unspecified: Secondary | ICD-10-CM | POA: Diagnosis not present

## 2020-12-08 DIAGNOSIS — M170 Bilateral primary osteoarthritis of knee: Secondary | ICD-10-CM

## 2020-12-08 DIAGNOSIS — I10 Essential (primary) hypertension: Secondary | ICD-10-CM | POA: Diagnosis not present

## 2020-12-08 DIAGNOSIS — D649 Anemia, unspecified: Secondary | ICD-10-CM

## 2020-12-08 NOTE — Patient Instructions (Signed)
Try to avoid gaining weight above and beyond 200 pounds.  Congratulations on quitting smoking.  Please remain free of cigarettes.  We will plan to refer her for colonoscopy again later in the year once your tonsillar cancer is in remission.[name]

## 2020-12-08 NOTE — Progress Notes (Signed)
Patient ID: Mitchell Cooper, male    DOB: 21-May-1961  MRN: [id number]  CC: Hypertension   Subjective: Mitchell Cooper is a 60 y.o. male who presents for chronic ds management. His concerns today include:  Patient with history of HTN, tob dep,OA knees,COVID-19 infection 05/2019, DVT/PE 2017(and massive PE 10/2019 req thrombolytics, PEA arrest, RHV with normalization of heart function on echo done 01/2020.Mitchell Cooper  Lifelong anticoagulation recommended by cardiology, pulmonary and vascular).   Tonsillar CA stage III ( Dr. Chryl Heck and Redmond Baseman)  Tonsil CA: Thinks he has completed treatment 1-2 wks ago but has appt again with XRT in 2 days. He has a PEG tube in place which he has not been using.  Reports loss taste with treatment but his taste is gradually coming back.   On liquid diet like broth, Ensure and Osmolite.  Wgh down 43 lbs since start of chemoXRT treatment in 2/022.  Sees the nutritionist tomorrow -quit smoking 6 mths ago -noted to have anemia associated with chemo.  Some fatigue and slight dizziness if he gets up too fast  OA knees: not bothering him as much with loss.  He reports he would not have wanted to have lost weight in this way but the good thing that came out of it was that he did lose some weight enough to make his joints feel a lot better.  He states that as he is able to eat a little bit more he plans to be careful with his portion sizes as he would like to keep his weight below 200 pounds.  HTN/RV failure/Hx of DVT/PE: compliant with Norvasc.  BP has been stable  -No CP/SOB No bruising or bleeding on Xarelto.   HM:  Placed c-scope on hold for now until he has completed cancer treatment and PEG is removed. Patient Active Problem List   Diagnosis Date Noted  . Port-A-Cath in place 11/20/2020  . Chemotherapy induced neutropenia ([place]) 11/13/2020  . Weight loss, unintentional 10/23/2020  . Mucositis due to antineoplastic therapy 10/23/2020  . Dental caries   . Chronic  periodontitis   . Carcinoma of tonsillar fossa ([place]) 09/12/2020  . 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 Tonsillar mass 08/08/2020  . Mass of left side of neck 07/09/2020  . Former smoker 04/08/2020  . Primary osteoarthritis of both knees 04/08/2020  . VTE (venous thromboembolism) 03/19/2020  . RVF (right ventricular failure) ([place]) 12/07/2019  . Recurrent pulmonary emboli ([place]) 11/03/2019  . 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 History of pulmonary embolus (PE) 06/14/2016  . Essential hypertension 06/14/2016  . CKD (chronic kidney disease) stage 3, GFR 30-59 ml/min (HCC) 06/14/2016  . Hyperglycemia 06/14/2016     Current Outpatient Medications on File Prior to Visit  Medication Sig Dispense Refill  . amLODipine (NORVASC) 10 MG tablet TAKE 1 TABLET (10 MG TOTAL) BY MOUTH DAILY. 30 tablet 6  . dexamethasone (DECADRON) 4 MG tablet Take 2 tablets (8 mg total) by mouth daily. Take daily x 3 days starting the day after cisplatin chemotherapy. Take with food. 30 tablet 1  . HYDROcodone-acetaminophen (HYCET) 7.5-325 mg/15 ml solution Take 10 mLs by mouth every 6 (six) hours as needed for moderate pain. 120 mL 0  . lidocaine (XYLOCAINE) 2 % solution Patient: Mix 1part 2% viscous lidocaine, 1part H20. Swish & swallow 36mL of diluted mixture, 68min before meals and at bedtime, up to QID 200 mL 4  . Lidocaine 2 % GEL Apply to painful skin as directed  prn. 28.33 g 3  . lidocaine-prilocaine (EMLA) cream Apply to affected area once 30 g 3  . Nutritional Supplements (FEEDING SUPPLEMENT, OSMOLITE 1.5 CAL,) LIQD 7 cartons of Osmolite 1.5 via PEG with 60 mL free water before and after bolus feedings.(2 cartons 3 times daily and 1 carton once daily) give 45 mL Prosource TF via PEG 3 times daily with 180 mL free water before and after.  Provides 2605 cal and 137 g protein, 2827 mL free water/100% estimated needs. 1659 mL 6  . ondansetron (ZOFRAN) 8 MG tablet Take 1 tablet (8 mg total) by  mouth 2 (two) times daily as needed. Start on the third day after cisplatin chemotherapy. 30 tablet 1  . prochlorperazine (COMPAZINE) 10 MG tablet Take 1 tablet (10 mg total) by mouth every 6 (six) hours as needed (Nausea or vomiting). 30 tablet 1  . rivaroxaban (XARELTO) 20 MG TABS tablet TAKE 1 TABLET (20 MG TOTAL) BY MOUTH DAILY WITH SUPPER. 30 tablet 6   No current facility-administered medications on file prior to visit.    No Known Allergies  Social History   Socioeconomic History  . Marital status: Married    Spouse name: Not on file  . Number of children: Not on file  . Years of education: Not on file  . Highest education level: Not on file  Occupational History  . Occupation: Architect  Tobacco Use  . Smoking status: Current Some Day Smoker    Types: Cigarettes    Last attempt to quit: 11/21/2016    Years since quitting: 4.0  . Smokeless tobacco: Never Used  Vaping Use  . Vaping Use: Never used  Substance and Sexual Activity  . Alcohol use: Yes    Alcohol/week: 6.0 standard drinks    Types: 6 Cans of beer per week  . Drug use: No  . Sexual activity: Not Currently  Other Topics Concern  . Not on file  Social History Narrative  . Not on file   Social Determinants of Health   Financial Resource Strain: Low Risk   . Difficulty of Paying Living Expenses: Not very hard  Food Insecurity: No Food Insecurity  . Worried About Charity fundraiser in the Last Year: Never true  . Ran Out of Food in the Last Year: Never true  Transportation Needs: No Transportation Needs  . Lack of Transportation (Medical): No  . Lack of Transportation (Non-Medical): No  Physical Activity: Not on file  Stress: No Stress Concern Present  . Feeling of Stress : Only a little  Social Connections: Socially Integrated  . Frequency of Communication with Friends and Family: More than three times a week  . Frequency of Social Gatherings with Friends and Family: More than three times a week  .  Attends Religious Services: More than 4 times per year  . Active Member of Clubs or Organizations: Yes  . Attends Archivist Meetings: More than 4 times per year  . Marital Status: Married  Human resources officer Violence: Not on file    Family History  Problem Relation Age of Onset  . CAD Mother 48    Past Surgical History:  Procedure Laterality Date  . IR GASTROSTOMY TUBE MOD SED  09/11/2020  . IR IMAGING GUIDED PORT INSERTION  09/11/2020  . KNEE SURGERY  approx. 1984   reconstructive surgery of left knee; arthroscopic procedure about 1997  . MULTIPLE EXTRACTIONS WITH ALVEOLOPLASTY N/A 09/25/2020   Procedure: MULTIPLE EXTRACTIONS WITH ALVEOLOPLASTY;  Surgeon: Sandi Mariscal  B, DMD;  Location: [facility];  Service: Dentistry;  Laterality: N/A;  . PERIPHERAL VASCULAR CATHETERIZATION Left 06/16/2016   Procedure: Lower Extremity Venography- Venus Thrombolysis;  Surgeon: Serafina Mitchell, MD;  Location: [facility];  Service: Cardiovascular;  Laterality: Left;    ROS: Review of Systems Negative except as stated above  PHYSICAL EXAM: BP 116/81   Pulse (!) 102   Resp 16   Ht 5\' 9"  (1.753 m)   Wt 177 lb 6.4 oz (80.5 kg)   SpO2 97%   BMI 26.20 kg/m   Wt Readings from Last 3 Encounters:  12/08/20 177 lb 6.4 oz (80.5 kg)  11/20/20 182 lb 9.6 oz (82.8 kg)  11/13/20 187 lb 6.4 oz (85 kg)    Physical Exam General appearance - alert, well appearing, older African-American male and in no distress Mental status - normal mood, behavior, speech, dress, motor activity, and thought processes Neck -golf ball sized mass noted at the angle of the jaw on the left side  chest -breath sounds decreased bilaterally without crackles wheezes or rhonchi's  heart - normal rate, regular rhythm, normal S1, S2, no murmurs, rubs, clicks or gallops Abdomen -PEG tube in place.  Normal bowel sounds  extremities -no lower extremity edema. CMP Latest Ref Rng & Units 11/20/2020 11/13/2020 11/04/2020   Glucose 70 - 99 mg/dL 97 104(H) 142(H)  BUN 6 - 20 mg/dL 20 16 27(H)  Creatinine 0.61 - 1.24 mg/dL 1.32(H) 1.09 1.43(H)  Sodium 135 - 145 mmol/L 136 137 137  Potassium 3.5 - 5.1 mmol/L 4.1 4.3 4.1  Chloride 98 - 111 mmol/L 97(L) 98 100  CO2 22 - 32 mmol/L 28 29 24   Calcium 8.9 - 10.3 mg/dL 9.3 8.9 9.3  Total Protein 6.5 - 8.1 g/dL - - -  Total Bilirubin 0.3 - 1.2 mg/dL - - -  Alkaline Phos 38 - 126 U/L - - -  AST 15 - 41 U/L - - -  ALT 0 - 44 U/L - - -   Lipid Panel  No results found for: CHOL, TRIG, HDL, CHOLHDL, VLDL, LDLCALC, LDLDIRECT  CBC    Component Value Date/Time   WBC 3.3 (L) 11/20/2020 0906   RBC 3.90 (L) 11/20/2020 0906   HGB 10.5 (L) 11/20/2020 0906   HGB 12.2 (L) 11/04/2020 1357   HGB 14.9 12/10/2019 1348   HCT 31.2 (L) 11/20/2020 0906   HCT 44.5 12/10/2019 1348   PLT 200 11/20/2020 0906   PLT 122 (L) 11/04/2020 1357   PLT 209 12/10/2019 1348   MCV 80.0 11/20/2020 0906   MCV 80 12/10/2019 1348   MCH 26.9 11/20/2020 0906   MCHC 33.7 11/20/2020 0906   RDW 17.2 (H) 11/20/2020 0906   RDW 15.9 (H) 12/10/2019 1348   LYMPHSABS 0.5 (L) 11/20/2020 0906   MONOABS 0.7 11/20/2020 0906   EOSABS 0.0 11/20/2020 0906   BASOSABS 0.0 11/20/2020 0906    ASSESSMENT AND PLAN: 1. Essential hypertension At goal. Continue amlodipine  2. Weight loss, unintentional Associated with cancer diagnosis and poor appetite due to loss of taste.  However patient reports that his appetite is returning and he is hoping that when he sees the nutritionist tomorrow his diet will be advanced.  3. Squamous cell carcinoma of left tonsil (HCC) Being treated with chemo and radiation  4. Former smoker Commended him on quitting.  Encouraged him to remain tobacco free  5. RVF (right ventricular failure) (HCC) Clinically stable.  Based on last echo done  in July of last year ventricular function has returned to normal.  6. Thrombophilia ([place]) On Xarelto.  Advised to report any bruising or  bleeding.  7. S/P percutaneous endoscopic gastrostomy (PEG) tube placement [facility]) In place as a precautionary measure with him having had radiation.  However patient states that so far he has not had to use it  8. Primary osteoarthritis of both knees Pain level is significantly improved with him having lost weight.  He has set a goal to keep his weight under 200.  9. Normocytic anemia In the setting of chemotherapy.    Patient was given the opportunity to ask questions.  Patient verbalized understanding of the plan and was able to repeat key elements of the plan.   No orders of the defined types were placed in this encounter.    Requested Prescriptions    No prescriptions requested or ordered in this encounter    No follow-ups on file.  Karle Plumber, MD, FACP

## 2020-12-10 ENCOUNTER — Other Ambulatory Visit: Payer: Self-pay

## 2020-12-10 ENCOUNTER — Ambulatory Visit
Admission: RE | Admit: 2020-12-10 | Discharge: 2020-12-10 | Disposition: A | Discharge status: Home / Self Care | Payer: Medicaid Other | Source: Ambulatory Visit | Attending: Radiation Oncology | Admitting: Radiation Oncology

## 2020-12-10 VITALS — BP 105/72 | HR 113 | Resp 17 | Wt 178.0 lb

## 2020-12-10 DIAGNOSIS — Z79899 Other long term (current) drug therapy: Secondary | ICD-10-CM | POA: Insufficient documentation

## 2020-12-10 DIAGNOSIS — Z7901 Long term (current) use of anticoagulants: Secondary | ICD-10-CM | POA: Insufficient documentation

## 2020-12-10 DIAGNOSIS — R5383 Other fatigue: Secondary | ICD-10-CM | POA: Insufficient documentation

## 2020-12-10 DIAGNOSIS — C090 Malignant neoplasm of tonsillar fossa: Secondary | ICD-10-CM | POA: Diagnosis not present

## 2020-12-10 DIAGNOSIS — Z923 Personal history of irradiation: Secondary | ICD-10-CM | POA: Diagnosis not present

## 2020-12-10 DIAGNOSIS — C099 Malignant neoplasm of tonsil, unspecified: Secondary | ICD-10-CM

## 2020-12-10 NOTE — Progress Notes (Addendum)
Mitchell Cooper presents today for 2 week follow-up after completing radiation to his left tonsil on 11/26/2020  Pain issues, if any: Reports mild discomfort to the left side of his throat. States it has improved significantly since completing radiation  Using a feeding tube?: No--drinking all fluids (even Osmolite) by mouth Weight changes, if any:  Wt Readings from Last 3 Encounters:  12/10/20 178 lb (80.7 kg)  12/08/20 177 lb 6.4 oz (80.5 kg)  11/20/20 182 lb 9.6 oz (82.8 kg)   Swallowing issues, if any: Patient denies. Consuming mainly liquids/soups, but denies any difficulty or signs of aspirating while drinking Smoking or chewing tobacco? None Using fluoride trays daily? N/A Last ENT visit was on: Not since diagnosis Other notable issues, if any: F/U with Dr. Arletha Pili Iruku scheduled for 12/23/2020. Denies any jaw pain or difficulty opening his mouth. Reports swelling to the left side of his neck near the mass continues to improve. Skin did peel/blister during the last week of treatment, but on assessment today it appears intact and almost back to his baseline coloring. Denies any mouth or tongue sores--continues to utilize baking soda/salt rinses. Reports occasional fatigue, but overall states he feels great and is even exercising more. He is very pleased with his progress thus far  Vitals:   12/10/20 1559  BP: 105/72  Pulse: (!) 113  Resp: 17  SpO2: 100%

## 2020-12-10 NOTE — Progress Notes (Signed)
Oncology Nurse Navigator Documentation  I met with Mitchell Cooper before his follow up appointment with Dr. Squire today. He is doing well and pleased with his progress since completing treatment for his head and neck cancer. I've asked him about seeing Carl Schinke SLP for follow up, he is agreeable and I've contacted Carl's office to call him for an appointment. He asked me about contacting somebody to help him access funds he has available in the [place] and he's aware that I've sent a message to Lenise White and asked her to contact him. [name] knows to contact me if he has any further needs or concerns in the future.     RN, BSN, OCN Head & Neck Oncology Nurse Navigator [facility] Phone # [phone]  Fax # [phone] 

## 2020-12-12 ENCOUNTER — Encounter: Payer: Self-pay | Admitting: Radiation Oncology

## 2020-12-12 ENCOUNTER — Other Ambulatory Visit: Payer: Self-pay

## 2020-12-12 DIAGNOSIS — C099 Malignant neoplasm of tonsil, unspecified: Secondary | ICD-10-CM

## 2020-12-12 NOTE — Progress Notes (Signed)
Radiation Oncology         [phone] ________________________________  Name: Mitchell Cooper MRN: [id number]  Date: 12/10/2020  DOB: 12-25-1960  Follow-Up Visit Note  CC: Mitchell Pier, MD  Melida Quitter, MD  Diagnosis and Prior Radiotherapy:       ICD-10-CM   1. 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 Carcinoma of tonsillar fossa (HCC)  C09.0    Cancer Staging Squamous cell carcinoma of left tonsil (HCC) Staging form: Pharynx - HPV-Mediated Oropharynx, AJCC 8th Edition - Clinical stage from 09/09/2020: Stage III (cT3, cN3, cM0, p16+) - Signed by Eppie Gibson, MD on 09/12/2020 Stage prefix: Initial diagnosis  Radiation Treatment Dates: 10/08/2020 through 11/26/2020 Site Technique Total Dose (Gy) Dose per Fx (Gy) Completed Fx Beam Energies  Tonsil, Left: HN_Lt_tonsil IMRT 70/70 2 35/35 6X    CHIEF COMPLAINT:  Here for follow-up and surveillance of throat cancer  Narrative:  The patient returns today for routine follow-up.   Mitchell Cooper presents today for 2 week follow-up after completing radiation to his left tonsil on 11/26/2020  Pain issues, if any: Reports mild discomfort to the left side of his throat. States it has improved significantly since completing radiation  Using a feeding tube?: No--drinking all fluids (even Osmolite) by mouth Weight changes, if any:  Wt Readings from Last 3 Encounters:  12/10/20 178 lb (80.7 kg)  12/08/20 177 lb 6.4 oz (80.5 kg)  11/20/20 182 lb 9.6 oz (82.8 kg)   Swallowing issues, if any: Patient denies. Consuming mainly liquids/soups, but denies any difficulty or signs of aspirating while drinking Smoking or chewing tobacco? None Using fluoride trays daily? N/A Last ENT visit was on: Not since diagnosis Other notable issues, if any: F/U with Dr. Arletha Pili Iruku scheduled for 12/23/2020. Denies any jaw pain or difficulty opening his mouth. Reports swelling to the left side of his neck near the mass continues to  improve. Skin did peel/blister during the last week of treatment, but on assessment today it appears intact and almost back to his baseline coloring. Denies any mouth or tongue sores--continues to utilize baking soda/salt rinses. Reports occasional fatigue, but overall states he feels great and is even exercising more. He is very pleased with his progress thus far.    Vitals:   12/10/20 1559  BP: 105/72  Pulse: (!) 113  Resp: 17  SpO2: 100%                       ALLERGIES:  has No Known Allergies.  Meds: Current Outpatient Medications  Medication Sig Dispense Refill  . amLODipine (NORVASC) 10 MG tablet TAKE 1 TABLET (10 MG TOTAL) BY MOUTH DAILY. 30 tablet 6  . dexamethasone (DECADRON) 4 MG tablet Take 2 tablets (8 mg total) by mouth daily. Take daily x 3 days starting the day after cisplatin chemotherapy. Take with food. 30 tablet 1  . HYDROcodone-acetaminophen (HYCET) 7.5-325 mg/15 ml solution Take 10 mLs by mouth every 6 (six) hours as needed for moderate pain. 120 mL 0  . lidocaine (XYLOCAINE) 2 % solution Patient: Mix 1part 2% viscous lidocaine, 1part H20. Swish & swallow 72mL of diluted mixture, 64min before meals and at bedtime, up to QID 200 mL 4  . Lidocaine 2 % GEL Apply to painful skin as directed prn. 28.33 g 3  . lidocaine-prilocaine (EMLA) cream Apply to affected area once 30 g 3  . Nutritional Supplements (FEEDING SUPPLEMENT, OSMOLITE 1.5 CAL,)  LIQD 7 cartons of Osmolite 1.5 via PEG with 60 mL free water before and after bolus feedings.(2 cartons 3 times daily and 1 carton once daily) give 45 mL Prosource TF via PEG 3 times daily with 180 mL free water before and after.  Provides 2605 cal and 137 g protein, 2827 mL free water/100% estimated needs. 1659 mL 6  . ondansetron (ZOFRAN) 8 MG tablet Take 1 tablet (8 mg total) by mouth 2 (two) times daily as needed. Start on the third day after cisplatin chemotherapy. 30 tablet 1  . prochlorperazine (COMPAZINE) 10 MG tablet Take 1  tablet (10 mg total) by mouth every 6 (six) hours as needed (Nausea or vomiting). 30 tablet 1  . rivaroxaban (XARELTO) 20 MG TABS tablet TAKE 1 TABLET (20 MG TOTAL) BY MOUTH DAILY WITH SUPPER. 30 tablet 6   No current facility-administered medications for this encounter.    Physical Findings: The patient is in no acute distress. Patient is alert and oriented. Wt Readings from Last 3 Encounters:  12/10/20 178 lb (80.7 kg)  12/08/20 177 lb 6.4 oz (80.5 kg)  11/20/20 182 lb 9.6 oz (82.8 kg)    weight is 178 lb (80.7 kg). His blood pressure is 105/72 and his pulse is 113 (abnormal). His respiration is 17 and oxygen saturation is 100%. .  General: Alert and oriented, in no acute distress HEENT: Head is normocephalic. Extraocular movements are intact. Oropharynx is notable for fullness in the left tonsil, no thrush Neck: Neck is notable for persistent, mobile left level 2 neck mass.  This has reduced in size significantly since starting treatment but is still at least 3 cm in dimension Skin: Skin in treatment fields shows satisfactory healing with residual hyperpigmentation, intact Lymphatics: see Neck Exam Psychiatric: Judgment and insight are intact. Affect is appropriate.   Lab Findings: Lab Results  Component Value Date   WBC 3.3 (L) 11/20/2020   HGB 10.5 (L) 11/20/2020   HCT 31.2 (L) 11/20/2020   MCV 80.0 11/20/2020   PLT 200 11/20/2020    Lab Results  Component Value Date   TSH 0.823 10/09/2020    Radiographic Findings: No results found.  Impression/Plan:    1) Head and Neck Cancer Status: Healing well from chemoradiation  We will order PET scan and follow-up to take place in early August.  The patient continues to have some residual fullness in the left tonsil and a mass in the left neck.  However, he has noted steady reduction in his neck mass.  He will let us know if this starts to grow before August.  Otherwise, we are hopeful for complete response by August.  If he  has residual masses at that time we will decide whether or not to biopsy them based on the PET avidity.  2) Nutritional Status: He is taking all nutrition by mouth.  He has lost some weight but he feels that this is helped his quality of life and stamina.  I recommended that he maintain his weight where it is now if not gain a little back PEG tube: Flush and use if needed  3) Risk Factors: The patient has been educated about risk factors including alcohol and tobacco abuse; they understand that avoidance of alcohol and tobacco is important to prevent recurrences as well as other cancers  4) Swallowing: Excellent, continue swallowing exercises  5) Dental: Encouraged to continue regular followup with dentistry, and dental hygiene including fluoride rinses.   6) Thyroid function:  Lab Results  Component Value Date   TSH 0.823 10/09/2020    7) follow-up in June with medical oncology and in August with me after restaging PET  On date of service, in total, I spent 20 minutes on this encounter. Patient was seen in person. _____________________________________   Eppie Gibson, MD

## 2020-12-23 ENCOUNTER — Encounter: Payer: Self-pay | Admitting: Nutrition

## 2020-12-23 ENCOUNTER — Inpatient Hospital Stay: Payer: Medicaid Other

## 2020-12-23 ENCOUNTER — Inpatient Hospital Stay: Payer: Medicaid Other | Admitting: Nutrition

## 2020-12-23 ENCOUNTER — Inpatient Hospital Stay: Payer: Medicaid Other | Attending: Hematology and Oncology | Admitting: Hematology and Oncology

## 2020-12-23 NOTE — Progress Notes (Signed)
Patient cancelled his nutrition follow up.

## 2021-01-07 ENCOUNTER — Other Ambulatory Visit: Payer: Self-pay

## 2021-01-07 MED FILL — Amlodipine Besylate Tab 10 MG (Base Equivalent): ORAL | 30 days supply | Qty: 30 | Fill #1 | Status: AC

## 2021-01-12 ENCOUNTER — Other Ambulatory Visit: Payer: Self-pay

## 2021-01-12 ENCOUNTER — Other Ambulatory Visit: Payer: Self-pay | Admitting: Internal Medicine

## 2021-01-12 ENCOUNTER — Encounter (HOSPITAL_COMMUNITY): Payer: Medicaid Other | Admitting: Dietician

## 2021-01-12 ENCOUNTER — Other Ambulatory Visit (HOSPITAL_COMMUNITY): Payer: Self-pay

## 2021-01-12 ENCOUNTER — Telehealth (HOSPITAL_COMMUNITY): Payer: Self-pay | Admitting: Dietician

## 2021-01-12 DIAGNOSIS — I2699 Other pulmonary embolism without acute cor pulmonale: Secondary | ICD-10-CM

## 2021-01-12 MED ORDER — RIVAROXABAN 20 MG PO TABS
20.0000 mg | ORAL_TABLET | Freq: Every day | ORAL | 6 refills | Status: DC
  Filled 2021-01-12: qty 30, 30d supply, fill #0
  Filled 2021-02-19: qty 30, 30d supply, fill #1
  Filled 2021-03-24: qty 30, 30d supply, fill #2
  Filled 2021-04-27: qty 30, 30d supply, fill #3
  Filled 2021-06-09: qty 30, 30d supply, fill #4
  Filled 2021-07-21: qty 30, 30d supply, fill #5
  Filled 2021-08-24: qty 30, 30d supply, fill #6

## 2021-01-12 NOTE — Telephone Encounter (Signed)
Nutrition Follow-up:  Patient completed radiation therapy for tonsil cancer on 5/11.   Spoke with patient via telephone. He reports doing well, eating soft foods and taste is "slowly but sure surely coming back" Patient is not using feeding tube except for daily water flushes. He reports needing to call Dr. Rob Hickman office to schedule appointment to discuss tube removal. Patient has been eating mashed potatoes with gravy, applesauce, soups, beans, drinking tea, gingerale, drinking "plenty of water" He is drinking 5 Ensure Plus and 1 carton of Osmolite 1.5 which he waters down. Patient reports trying meat a couple of weeks ago, says it still tasted like wood. He tried eggs last week. Patient is positive about his progress and going to keep trying foods. He continues to have thick saliva, states "its a whole lot better than it use to be" Patient is using baking soda/salt water rinses some. Patient is not weighing himself, he does not have a scale.    Medications: reviewed  Labs: reviewed  Anthropometrics: Last weight 178 lb on 5/25 decreased from 182.6 lb on 5/5 and 187.4 lb on 4/28   Estimated Energy Needs  Kcals: 2400-2600 Protein: 120-140 g Fluid: 2.5 L  NUTRITION DIAGNOSIS: Food and nutrition related knowledge deficit improving   INTERVENTION:  -Educated on the importance of adequate calories and protein for healing -Discussed soft and moist high protein foods and strategies for altered taste of meats -Continue drinking 5 Ensure Plus and 1 carton Osmolite 1.5 by mouth (provides 2105 kcal, 94.9 g protein) -Will mail Ensure coupons -Continue flushing feeding tube daily -Educated to continue baking soda/salt water rinses several times daily     MONITORING, EVALUATION, GOAL: weight trends, intake   NEXT VISIT: Tuesday, August 9 in clinic

## 2021-01-13 ENCOUNTER — Other Ambulatory Visit (HOSPITAL_COMMUNITY): Payer: Self-pay

## 2021-01-14 ENCOUNTER — Encounter (HOSPITAL_COMMUNITY): Payer: Self-pay

## 2021-01-14 ENCOUNTER — Emergency Department (HOSPITAL_COMMUNITY)
Admission: EM | Admit: 2021-01-14 | Discharge: 2021-01-14 | Disposition: A | Discharge status: Home / Self Care | Payer: Medicaid Other | Attending: Emergency Medicine | Admitting: Emergency Medicine

## 2021-01-14 DIAGNOSIS — Z431 Encounter for attention to gastrostomy: Secondary | ICD-10-CM | POA: Diagnosis not present

## 2021-01-14 DIAGNOSIS — Z4659 Encounter for fitting and adjustment of other gastrointestinal appliance and device: Secondary | ICD-10-CM

## 2021-01-14 DIAGNOSIS — K9423 Gastrostomy malfunction: Secondary | ICD-10-CM | POA: Diagnosis not present

## 2021-01-14 DIAGNOSIS — F17210 Nicotine dependence, cigarettes, uncomplicated: Secondary | ICD-10-CM | POA: Insufficient documentation

## 2021-01-14 DIAGNOSIS — I129 Hypertensive chronic kidney disease with stage 1 through stage 4 chronic kidney disease, or unspecified chronic kidney disease: Secondary | ICD-10-CM | POA: Diagnosis not present

## 2021-01-14 DIAGNOSIS — Z85818 Personal history of malignant neoplasm of other sites of lip, oral cavity, and pharynx: Secondary | ICD-10-CM | POA: Insufficient documentation

## 2021-01-14 DIAGNOSIS — Z7901 Long term (current) use of anticoagulants: Secondary | ICD-10-CM | POA: Insufficient documentation

## 2021-01-14 DIAGNOSIS — Z79899 Other long term (current) drug therapy: Secondary | ICD-10-CM | POA: Diagnosis not present

## 2021-01-14 DIAGNOSIS — N183 Chronic kidney disease, stage 3 unspecified: Secondary | ICD-10-CM | POA: Insufficient documentation

## 2021-01-14 NOTE — Discharge Instructions (Addendum)
Please make sure to follow-up with your stomach doctor.  Return to the ER for any new or worsening symptoms.

## 2021-01-14 NOTE — ED Notes (Signed)
Discharge papers given to patient by PA and verbalized understanding.

## 2021-01-14 NOTE — ED Provider Notes (Signed)
[facility] DEPT Provider Note   CSN: [id number] Arrival date & time: 01/14/21  1519     History No chief complaint on file.   Mitchell Cooper is a 60 y.o. male.  HPI 60 year old male states that his feeding tube came out today after cleaning it.  Has no abdominal pain.  States he had a place back when he underwent cancer treatment, they thought that he may need to use it.  He has not used it once since initiation of treatment.  He has now finished treatment.  He was trying to follow-up with his GI to actually have it removed.  States he does not want it back in.    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   Tonsillar mass 08/08/2020   Mass of left side of neck 07/09/2020   Former smoker 04/08/2020   Primary osteoarthritis of both knees 04/08/2020   VTE (venous thromboembolism) 03/19/2020   RVF (right ventricular failure) ([place]) 12/07/2019   Recurrent pulmonary emboli ([place]) 11/03/2019   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 1984   reconstructive surgery of left knee;  arthroscopic procedure about [place] WITH ALVEOLOPLASTY N/A 09/25/2020   Procedure: MULTIPLE EXTRACTIONS WITH ALVEOLOPLASTY;  Surgeon: Charlaine Dalton, DMD;  Location: [facility];  Service: Dentistry;  Laterality: N/A;   PERIPHERAL VASCULAR CATHETERIZATION Left 06/16/2016   Procedure: Lower Extremity Venography- Venus Thrombolysis;  Surgeon: Serafina Mitchell, MD;  Location: [facility];  Service: Cardiovascular;  Laterality: Left;       Family History  Problem Relation Age of Onset   CAD Mother 60    Social History   Tobacco Use   Smoking status: Some Days    Pack years: 0.00    Types: Cigarettes    Last attempt to quit: 11/21/2016    Years since quitting: 4.1   Smokeless tobacco: Never  Vaping Use   Vaping Use: Never used  Substance Use Topics   Alcohol use: Yes    Alcohol/week: 6.0 standard drinks    Types: 6 Cans of beer per week   Drug use: No    Home Medications Prior to Admission medications   Medication Sig Start Date End Date Taking? Authorizing Provider  amLODipine (NORVASC) 10 MG tablet TAKE 1 TABLET (10 MG TOTAL) BY MOUTH DAILY. 04/08/20 04/08/21  Ladell Pier, MD  dexamethasone (DECADRON) 4 MG tablet Take 2 tablets (8 mg total) by mouth daily. Take daily x 3 days starting the day after cisplatin chemotherapy. Take with food. 09/29/20  Benay Pike, MD  HYDROcodone-acetaminophen (HYCET) 7.5-325 mg/15 ml solution Take 10 mLs by mouth every 6 (six) hours as needed for moderate pain. 10/30/20   Benay Pike, MD  lidocaine (XYLOCAINE) 2 % solution Patient: Mix 1part 2% viscous lidocaine, 1part H20. Swish & swallow 34mL of diluted mixture, 32min before meals and at bedtime, up to QID 10/13/20   Eppie Gibson, MD  Lidocaine 2 % GEL Apply to painful skin as directed prn. 11/03/20   Eppie Gibson, MD  lidocaine-prilocaine (EMLA) cream Apply to affected area once 10/02/20   Benay Pike, MD  Nutritional Supplements (FEEDING SUPPLEMENT, OSMOLITE  1.5 CAL,) LIQD 7 cartons of Osmolite 1.5 via PEG with 60 mL free water before and after bolus feedings.(2 cartons 3 times daily and 1 carton once daily) give 45 mL Prosource TF via PEG 3 times daily with 180 mL free water before and after.  Provides 2605 cal and 137 g protein, 2827 mL free water/100% estimated needs. 10/23/20   Eppie Gibson, MD  ondansetron (ZOFRAN) 8 MG tablet Take 1 tablet (8 mg total) by mouth 2 (two) times daily as needed. Start on the third day after cisplatin chemotherapy. 10/02/20   Benay Pike, MD  prochlorperazine (COMPAZINE) 10 MG tablet Take 1 tablet (10 mg total) by mouth every 6 (six) hours as needed (Nausea or vomiting). 10/02/20   Benay Pike, MD  rivaroxaban (XARELTO) 20 MG TABS tablet Take 1 tablet (20 mg total) by mouth daily with supper. 01/12/21   Ladell Pier, MD    Allergies    Patient has no known allergies.  Review of Systems   Review of Systems Ten systems reviewed and are negative for acute change, except as noted in the HPI.   Physical Exam Updated Vital Signs BP 105/65 (BP Location: Left Arm)   Pulse 95   Temp 98.3 F (36.8 C) (Oral)   Resp 18   SpO2 99%   Physical Exam Vitals and nursing note reviewed.  Constitutional:      Appearance: He is well-developed.  HENT:     Head: Normocephalic and atraumatic.  Eyes:     Conjunctiva/sclera: Conjunctivae normal.  Cardiovascular:     Rate and Rhythm: Normal rate and regular rhythm.     Heart sounds: No murmur heard. Pulmonary:     Effort: Pulmonary effort is normal. No respiratory distress.     Breath sounds: Normal breath sounds.  Abdominal:     Palpations: Abdomen is soft.     Tenderness: There is no abdominal tenderness.     Comments: Left sided stoma without evidence of infection, no erythema, drainage, abdomen soft and nontender  Musculoskeletal:        General: Normal range of motion.     Cervical back: Neck supple.  Skin:    General: Skin is warm and dry.   Neurological:     General: No focal deficit present.     Mental Status: He is alert.    ED Results / Procedures / Treatments   Labs (all labs ordered are listed, but only abnormal results are displayed) Labs Reviewed - No data to display  EKG None  Radiology No results found.  Procedures Procedures   Medications Ordered in ED Medications - No data to display  ED Course  I have reviewed the triage vital signs and the nursing notes.  Pertinent labs & imaging results that were available during my care of the patient were reviewed by me and considered in my medical decision  making (see chart for details).    MDM Rules/Calculators/A&P                          Patient here with feeding tube dislodgment, does not want it back in.  States he never uses it.  He has finished chemotherapy and has been wanting to have it removed anyway.  Patient informed to keep the area clean and dry, keep covered with a bandage.  Stressed GI follow-up.  Discussed return precautions.  He voiced understanding and is agreeable.  Stable for discharge. Final Clinical Impression(s) / ED Diagnoses Final diagnoses:  Visit for feeding tube placement    Rx / DC Orders ED Discharge Orders     None        Lyndel Safe 01/14/21 1601    Luna Fuse, MD 01/23/21 [phone]

## 2021-01-14 NOTE — ED Triage Notes (Signed)
Patient reports his feeding tube came all the way out after cleaning it about 2 hours ago.   0/10 pain.   A/ox4 Ambulatory in triage.

## 2021-01-16 ENCOUNTER — Encounter: Payer: Self-pay | Admitting: Hematology and Oncology

## 2021-01-23 ENCOUNTER — Telehealth: Payer: Self-pay | Admitting: Hematology and Oncology

## 2021-01-23 NOTE — Telephone Encounter (Signed)
Scheduled appt per 7/8 sch msg. Pt aware.  

## 2021-01-28 ENCOUNTER — Other Ambulatory Visit: Payer: Self-pay

## 2021-01-28 ENCOUNTER — Inpatient Hospital Stay: Payer: Medicaid Other | Attending: Hematology and Oncology | Admitting: Hematology and Oncology

## 2021-01-28 VITALS — BP 103/66 | HR 108 | Temp 98.2°F | Resp 18 | Wt 165.0 lb

## 2021-01-28 DIAGNOSIS — R634 Abnormal weight loss: Secondary | ICD-10-CM | POA: Diagnosis not present

## 2021-01-28 DIAGNOSIS — F17210 Nicotine dependence, cigarettes, uncomplicated: Secondary | ICD-10-CM | POA: Insufficient documentation

## 2021-01-28 DIAGNOSIS — Z7901 Long term (current) use of anticoagulants: Secondary | ICD-10-CM | POA: Diagnosis not present

## 2021-01-28 DIAGNOSIS — C099 Malignant neoplasm of tonsil, unspecified: Secondary | ICD-10-CM | POA: Diagnosis not present

## 2021-01-28 DIAGNOSIS — Z86718 Personal history of other venous thrombosis and embolism: Secondary | ICD-10-CM | POA: Insufficient documentation

## 2021-01-28 DIAGNOSIS — G893 Neoplasm related pain (acute) (chronic): Secondary | ICD-10-CM | POA: Diagnosis not present

## 2021-01-28 DIAGNOSIS — Z9221 Personal history of antineoplastic chemotherapy: Secondary | ICD-10-CM | POA: Diagnosis not present

## 2021-01-28 DIAGNOSIS — I10 Essential (primary) hypertension: Secondary | ICD-10-CM | POA: Insufficient documentation

## 2021-01-28 DIAGNOSIS — Z79899 Other long term (current) drug therapy: Secondary | ICD-10-CM | POA: Insufficient documentation

## 2021-01-28 NOTE — Progress Notes (Signed)
[facility] NOTE  Patient Care Team: Mitchell Pier, MD as PCP - General (Internal Medicine) Mitchell Quitter, MD as Consulting Physician (Otolaryngology) Mitchell Gibson, MD as Consulting Physician (Radiation Oncology) Mitchell Pike, MD as Consulting Physician (Hematology and Oncology) Mitchell Cooper, Mitchell Police, RN as Oncology Nurse Navigator  CHIEF COMPLAINTS/PURPOSE OF CONSULTATION:  New diagnosis of left tonsillar cancer.  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 Given the above information and very locally advanced disease, we decided to proceed with concurrent chemoradiation with weekly cisplatin.  C1D1 of cisplatin started on 10/09/2020 C2D1 of cisplatin today, 10/16/2020 C3D1 completed 10/23/2020 C4D1 completed 10/30/2020 C5D1 completed 11/06/2020 C6D1 completed 11/20/2020 Final IMRT treatment on 11/26/2020  She is here for follow-up after completion of concurrent chemoradiation. Review of systems today with increasing pain in the left neck radiating to the left ear.  Physical examination today concern for left cervical lymphadenopathy progression.  It almost measured 6 cm on exam today.  Previously was about 3 to 4 cm. We will proceed with PET/CT as soon as possible for reevaluation.  I wonder if he has residual disease which may need treatment sooner than later. He is agreeable to all these recommendations.  Weight loss, unintentional Weight loss almost 14 pounds since last visit.  He had his G-tube removed in the ED because it fell off.  He has been able to eat and swallow and has started eating better since last week.  He has been drinking some energy drinks.  He denies any difficulty swallowing.  We will reengage him with Mitchell Cooper for nutritionist for further recommendations.  Cancer-related pain He has been using some leftover  oxycodone pills for management of pain. He doesn't do it daily  Orders Placed This Encounter  Procedures   CBC with Differential/Platelet    Standing Status:   Standing    Number of Occurrences:   22    Standing Expiration Date:   01/28/2022   CMP ([place] only)    Standing Status:   Future    Standing Expiration Date:   01/28/2022   HISTORY OF PRESENTING ILLNESS:   Mitchell Cooper 61 y.o. male is here for follow up of his tonsillar cancer  Oncology History Overview Note   Imaging and work up done so far.  07/23/2020  IMPRESSION: 1. 2.4 x 4.7 cm mass in the left oropharynx which appears to be arising from the soft palate extending inferiorly. Findings compatible with carcinoma. 2. Large necrotic lymph node mass in the left neck compatible with metastatic disease. Additional small ill-defined lymph nodes the left could be due to tumor as well. 3. Poor dentition.  08/27/2020  Final Pathologic Diagnosis    A.  LEFT TONSIL, BIOPSY:              Poorly differentiated malignant neoplasm.              See comment.    Electronically signed by Mitchell Bandy, MD on 08/13/2020 at 12:10 PM  Comment    This biopsy show the presence of a poorly differentiated malignant neoplasm located at the subepithelial stroma.  Immunostains with appropriate controls show that tumor cells are positive for vimentin and p16, and negative for pancytokeratin (AE1/3), low molecular weight cytokeratin, CK5/6, pan melanoma markers, SOX10, S100, desmin, CD34, CD30  and p40.  CD45, CD3 and CD20 are positive in  some background lymphocytes and the tumor cells appear to be negative.  Ki67 is positive in about 40% of the tumor cells.   The findings are consistent with a poorly differentiated malignant neoplasm of undetermined classification.  This tumor shows negative reaction for multiple keratin markers, but a very poorly differentiated carcinoma still can not be entirely ruled.  Because of the negative reaction  for multiple markers of melanoma, a malignant melanoma is less likely.  Dr. Sherran Cooper also reviewed this case, and believes that the findings in this biopsy are not diagnostic of lymphoma.  A malignant mesenchymal neoplasm is possible, but other types of malignant neoplasm can not be ruled out.   Since this is a relatively small biopsy, additional sampling of the lesion may probably provide additional findings to help further classification of this tumor.  Clinical correlation is recommended.     Dr. Tommas Cooper reviewed this case and agrees with this diagnosis.    08/27/2020  IMPRESSION: 1. Left tonsillar mass is hypermetabolic and consistent with neoplasm. 2. Bulky necrotic left metastatic nodal disease. 3. No findings for metastatic disease involving the chest, abdomen/pelvis or bony structures.   Squamous cell carcinoma of left tonsil ([place])  09/03/2020 Initial Diagnosis   Squamous cell carcinoma of left tonsil ([place])    09/09/2020 Cancer Staging   Staging form: Pharynx - HPV-Mediated Oropharynx, AJCC 8th Edition - Clinical stage from 09/09/2020: Stage III (cT3, cN3, cM0, p16+) - Signed by Mitchell Gibson, MD on 09/12/2020  Stage prefix: Initial diagnosis    09/17/2020 - 09/17/2020 Chemotherapy          10/09/2020 -  Chemotherapy    Patient is on Treatment Plan: HEAD/NECK CISPLATIN Q7D        Interval history  Patient is here for planned follow-up.  Since last visit, he noticed more pain in the left neck radiating to the left ear. He has also lost about 13 pounds of weight since his last visit.  He tells me that he just started eating a bit better last week.  His G-tube fell out and he went to the ER and they had to remove it.  He did not get another one replaced.   Besides neck pain and left ear pain, he feels well.  He denies any changes in breathing, bowel or urinary habits. Rest of the pertinent 10 point ROS reviewed and negative.  MEDICAL HISTORY:  Past Medical History:  Diagnosis  Date   Cancer ([place])    Left Tonsillar Cancer    DVT (deep venous thrombosis) ([place])    Hypertension 1997   has never taken medications   Tobacco dependence     SURGICAL HISTORY: Past Surgical History:  Procedure Laterality Date   IR GASTROSTOMY TUBE MOD SED  09/11/2020   IR IMAGING GUIDED PORT INSERTION  09/11/2020   KNEE SURGERY  approx. 1984   reconstructive surgery of left knee; arthroscopic procedure about [place] WITH ALVEOLOPLASTY N/A 09/25/2020   Procedure: MULTIPLE EXTRACTIONS WITH ALVEOLOPLASTY;  Surgeon: Charlaine Dalton, DMD;  Location: [facility];  Service: Dentistry;  Laterality: N/A;   PERIPHERAL VASCULAR CATHETERIZATION Left 06/16/2016   Procedure: Lower Extremity Venography- Venus Thrombolysis;  Surgeon: Serafina Mitchell, MD;  Location: [facility];  Service: Cardiovascular;  Laterality: Left;    SOCIAL HISTORY: Social History   Socioeconomic History   Marital status: Married    Spouse name: Not on file   Number of children: Not on file   Years  of education: Not on file   Highest education level: Not on file  Occupational History   Occupation: Architect  Tobacco Use   Smoking status: Some Days    Types: Cigarettes    Last attempt to quit: 11/21/2016    Years since quitting: 4.1   Smokeless tobacco: Never  Vaping Use   Vaping Use: Never used  Substance and Sexual Activity   Alcohol use: Yes    Alcohol/week: 6.0 standard drinks    Types: 6 Cans of beer per week   Drug use: No   Sexual activity: Not Currently  Other Topics Concern   Not on file  Social History Narrative   Not on file   Social Determinants of Health   Financial Resource Strain: Low Risk    Difficulty of Paying Living Expenses: Not very hard  Food Insecurity: No Food Insecurity   Worried About Running Out of Food in the Last Year: Never true   Ran Out of Food in the Last Year: Never true  Transportation Cooper: No Transportation Cooper   Lack of Transportation  (Medical): No   Lack of Transportation (Non-Medical): No  Physical Activity: Not on file  Stress: No Stress Concern Present   Feeling of Stress : Only a little  Social Connections: Engineer, building services of Communication with Friends and Family: More than three times a week   Frequency of Social Gatherings with Friends and Family: More than three times a week   Attends Religious Services: More than 4 times per year   Active Member of [employer] or Organizations: Yes   Attends Music therapist: More than 4 times per year   Marital Status: Married  Human resources officer Violence: Not on file    FAMILY HISTORY: Family History  Problem Relation Age of Onset   CAD Mother 35    ALLERGIES:  has No Known Allergies.  MEDICATIONS:  Current Outpatient Medications  Medication Sig Dispense Refill   amLODipine (NORVASC) 10 MG tablet TAKE 1 TABLET (10 MG TOTAL) BY MOUTH DAILY. 30 tablet 6   dexamethasone (DECADRON) 4 MG tablet Take 2 tablets (8 mg total) by mouth daily. Take daily x 3 days starting the day after cisplatin chemotherapy. Take with food. 30 tablet 1   HYDROcodone-acetaminophen (HYCET) 7.5-325 mg/15 ml solution Take 10 mLs by mouth every 6 (six) hours as needed for moderate pain. 120 mL 0   lidocaine (XYLOCAINE) 2 % solution Patient: Mix 1part 2% viscous lidocaine, 1part H20. Swish & swallow 5m of diluted mixture, 268m before meals and at bedtime, up to QID 200 mL 4   Lidocaine 2 % GEL Apply to painful skin as directed prn. 28.33 g 3   lidocaine-prilocaine (EMLA) cream Apply to affected area once 30 g 3   Nutritional Supplements (FEEDING SUPPLEMENT, OSMOLITE 1.5 CAL,) LIQD 7 cartons of Osmolite 1.5 via PEG with 60 mL free water before and after bolus feedings.(2 cartons 3 times daily and 1 carton once daily) give 45 mL Prosource TF via PEG 3 times daily with 180 mL free water before and after.  Provides 2605 cal and 137 g protein, 2827 mL free water/100% estimated Cooper.  1659 mL 6   ondansetron (ZOFRAN) 8 MG tablet Take 1 tablet (8 mg total) by mouth 2 (two) times daily as needed. Start on the third day after cisplatin chemotherapy. 30 tablet 1   prochlorperazine (COMPAZINE) 10 MG tablet Take 1 tablet (10 mg total) by mouth every 6 (six) hours as  needed (Nausea or vomiting). 30 tablet 1   rivaroxaban (XARELTO) 20 MG TABS tablet Take 1 tablet (20 mg total) by mouth daily with supper. 30 tablet 6   No current facility-administered medications for this visit.     PHYSICAL EXAMINATION: ECOG PERFORMANCE STATUS: 0 - Asymptomatic  Vitals:   01/28/21 1601  BP: 103/66  Pulse: (!) 108  Resp: 18  Temp: 98.2 F (36.8 C)  SpO2: 100%   Filed Weights   01/28/21 1601  Weight: 165 lb (74.8 kg)    Physical Exam Constitutional:      Appearance: Normal appearance.  HENT:     Head: Normocephalic and atraumatic.     Mouth/Throat:     Mouth: Mucous membranes are moist.     Pharynx: No oropharyngeal exudate or posterior oropharyngeal erythema.  Cardiovascular:     Rate and Rhythm: Normal rate and regular rhythm.     Pulses: Normal pulses.     Heart sounds: Normal heart sounds.  Pulmonary:     Effort: Pulmonary effort is normal.     Breath sounds: Normal breath sounds.  Abdominal:     General: Abdomen is flat.     Palpations: Abdomen is soft.  Musculoskeletal:        General: No swelling or tenderness.     Cervical back: No rigidity or tenderness.  Lymphadenopathy:     Cervical: Cervical adenopathy (left sided cervical lymphadenopathy measuring about 6 cms, increased in size compared to the last visit) present.  Neurological:     General: No focal deficit present.     Mental Status: He is alert.  Psychiatric:        Mood and Affect: Mood normal.     LABORATORY DATA:  I have reviewed the data as listed Lab Results  Component Value Date   WBC 3.3 (L) 11/20/2020   HGB 10.5 (L) 11/20/2020   HCT 31.2 (L) 11/20/2020   MCV 80.0 11/20/2020   PLT 200  11/20/2020     Chemistry      Component Value Date/Time   NA 136 11/20/2020 0906   NA 139 12/10/2019 1348   K 4.1 11/20/2020 0906   CL 97 (L) 11/20/2020 0906   CO2 28 11/20/2020 0906   BUN 20 11/20/2020 0906   BUN 17 12/10/2019 1348   CREATININE 1.32 (H) 11/20/2020 0906   CREATININE 1.43 (H) 11/04/2020 1357      Component Value Date/Time   CALCIUM 9.3 11/20/2020 0906   ALKPHOS 72 09/03/2020 1534   AST 12 (L) 09/03/2020 1534   ALT 12 09/03/2020 1534   BILITOT 0.4 09/03/2020 1534      RADIOGRAPHIC STUDIES: I have personally reviewed the radiological images as listed and agreed with the findings in the report. No results found.  I have spent 30 minutes in the care of this patient including history and physical, review of records, discussion about proceeding with PET sooner than later given concern for progression, nutrition recommendations today.  All questions were answered. The patient knows to call the clinic with any problems, questions or concerns.    Mitchell Pike, MD 01/29/2021 8:19 AM

## 2021-01-29 ENCOUNTER — Ambulatory Visit: Payer: Medicaid Other | Admitting: Nutrition

## 2021-01-29 ENCOUNTER — Telehealth: Payer: Self-pay | Admitting: Nutrition

## 2021-01-29 ENCOUNTER — Encounter: Payer: Self-pay | Admitting: Hematology and Oncology

## 2021-01-29 DIAGNOSIS — G893 Neoplasm related pain (acute) (chronic): Secondary | ICD-10-CM | POA: Insufficient documentation

## 2021-01-29 NOTE — Telephone Encounter (Signed)
Nutrition follow-up completed with patient over telephone. Patient is status post  radiation therapy for tonsil cancer on May 11. Patient's feeding tube fell out and it is no longer a viable option for nutrition. Patient reports his taste is beginning to come back and he is starting to eat solid food. Reports eating vegetable soup with crackers and chicken noodle soup, mashed potatoes, grits, [place] toast, and eggs.  He is going to try salmon tomorrow.  He is drinking 4 Ensure plus and 3 Osmolite 1.5 by mouth.  He is also drinking Prosource 2-3 times a day and says it is delicious. Patient reports he did have some loose stools.  Weight decreased and documented as 165 pounds July 13 decreased from 178 pounds May 25.  Estimated nutrition needs: 2400-2600 cal, 120-140 g protein, 2.5 L fluid.  Nutrition diagnosis: Food and nutrition related knowledge deficit continues.  Intervention: Patient educated on the importance of consuming small frequent meals and snacks with solid food.  Reviewed strategies for high-calorie high protein soft foods. Continue 7 cartons Ensure Plus or equivalent by mouth and Prosource 2-3 times daily to provide approximately 2605 cal and  123 g protein.  Do not discontinue until RD visit on August 9. Patient is agreeable to plan.  Monitoring, evaluation, goals: Will monitor weight and intake.  Next visit: Tuesday, August 9 after MD visit.  **Disclaimer: This note was dictated with voice recognition software. Similar sounding words can inadvertently be transcribed and this note may contain transcription errors which may not have been corrected upon publication of note.**

## 2021-01-29 NOTE — Assessment & Plan Note (Signed)
He has been using some leftover oxycodone pills for management of pain. He doesn't do it daily

## 2021-01-29 NOTE — Assessment & Plan Note (Signed)
SCC left tonsil, T3 N3 M0/ Stage III, P16 positive Repeat biopsy suggested poorly differentiated SCC. Given the above information and very locally advanced disease, we decided to proceed with concurrent chemoradiation with weekly cisplatin.  C1D1 of cisplatin started on 10/09/2020 C2D1 of cisplatin today, 10/16/2020 C3D1 completed 10/23/2020 C4D1 completed 10/30/2020 C5D1 completed 11/06/2020 C6D1 completed 11/20/2020 Final IMRT treatment on 11/26/2020  She is here for follow-up after completion of concurrent chemoradiation. Review of systems today with increasing pain in the left neck radiating to the left ear.  Physical examination today concern for left cervical lymphadenopathy progression.  It almost measured 6 cm on exam today.  Previously was about 3 to 4 cm. We will proceed with PET/CT as soon as possible for reevaluation.  I wonder if he has residual disease which may need treatment sooner than later. He is agreeable to all these recommendations.

## 2021-01-29 NOTE — Assessment & Plan Note (Signed)
Weight loss almost 14 pounds since last visit.  He had his G-tube removed in the ED because it fell off.  He has been able to eat and swallow and has started eating better since last week.  He has been drinking some energy drinks.  He denies any difficulty swallowing.  We will reengage him with Pamala Hurry for nutritionist for further recommendations.

## 2021-01-29 NOTE — Progress Notes (Signed)
error 

## 2021-01-30 ENCOUNTER — Inpatient Hospital Stay: Payer: Medicaid Other

## 2021-01-30 ENCOUNTER — Other Ambulatory Visit: Payer: Self-pay

## 2021-01-30 DIAGNOSIS — C099 Malignant neoplasm of tonsil, unspecified: Secondary | ICD-10-CM | POA: Diagnosis not present

## 2021-01-30 LAB — CMP (CANCER CENTER ONLY)
ALT: <6 U/L (ref 0–44)
AST: 8 U/L — ABNORMAL LOW (ref 15–41)
Albumin: 3.1 g/dL — ABNORMAL LOW (ref 3.5–5.0)
Alkaline Phosphatase: 56 U/L (ref 38–126)
Anion gap: 7 (ref 5–15)
BUN: 24 mg/dL — ABNORMAL HIGH (ref 6–20)
CO2: 27 mmol/L (ref 22–32)
Calcium: 9.4 mg/dL (ref 8.9–10.3)
Chloride: 104 mmol/L (ref 98–111)
Creatinine: 1.68 mg/dL — ABNORMAL HIGH (ref 0.61–1.24)
GFR, Estimated: 47 mL/min — ABNORMAL LOW (ref >60)
Glucose, Bld: 108 mg/dL — ABNORMAL HIGH (ref 70–99)
Potassium: 4 mmol/L (ref 3.5–5.1)
Sodium: 138 mmol/L (ref 135–145)
Total Bilirubin: 0.2 mg/dL — ABNORMAL LOW (ref 0.3–1.2)
Total Protein: 7.4 g/dL (ref 6.5–8.1)

## 2021-01-30 LAB — CBC WITH DIFFERENTIAL/PLATELET
Abs Immature Granulocytes: 0.04 10*3/uL (ref 0.00–0.07)
Basophils Absolute: 0 10*3/uL (ref 0.0–0.1)
Basophils Relative: 0 %
Eosinophils Absolute: 0.1 10*3/uL (ref 0.0–0.5)
Eosinophils Relative: 1 %
HCT: 28.3 % — ABNORMAL LOW (ref 39.0–52.0)
Hemoglobin: 9.5 g/dL — ABNORMAL LOW (ref 13.0–17.0)
Immature Granulocytes: 1 %
Lymphocytes Relative: 6 %
Lymphs Abs: 0.5 10*3/uL — ABNORMAL LOW (ref 0.7–4.0)
MCH: 30.9 pg (ref 26.0–34.0)
MCHC: 33.6 g/dL (ref 30.0–36.0)
MCV: 92.2 fL (ref 80.0–100.0)
Monocytes Absolute: 1.1 10*3/uL — ABNORMAL HIGH (ref 0.1–1.0)
Monocytes Relative: 13 %
Neutro Abs: 6.5 10*3/uL (ref 1.7–7.7)
Neutrophils Relative %: 79 %
Platelets: 232 10*3/uL (ref 150–400)
RBC: 3.07 MIL/uL — ABNORMAL LOW (ref 4.22–5.81)
RDW: 15.3 % (ref 11.5–15.5)
WBC: 8.3 10*3/uL (ref 4.0–10.5)
nRBC: 0 % (ref 0.0–0.2)

## 2021-01-30 LAB — MAGNESIUM: Magnesium: 2 mg/dL (ref 1.7–2.4)

## 2021-02-04 ENCOUNTER — Telehealth: Payer: Self-pay | Admitting: *Deleted

## 2021-02-04 NOTE — Telephone Encounter (Signed)
CALLED PATIENT'S DAUGHTER DASIHA NEWKIRK TO INFORM OF PET SCAN FOR 02-06-21 - ARRIVAL TIME- 8:30 AM @ [facility], PATIENT TO BE NPO- 6 HRS. PRIOR TO TEST,LVM FOR A RETURN CALL

## 2021-02-06 ENCOUNTER — Other Ambulatory Visit: Payer: Self-pay

## 2021-02-06 ENCOUNTER — Ambulatory Visit (HOSPITAL_COMMUNITY)
Admission: RE | Admit: 2021-02-06 | Discharge: 2021-02-06 | Disposition: A | Discharge status: Home / Self Care | Payer: Medicaid Other | Source: Ambulatory Visit | Attending: Radiation Oncology | Admitting: Radiation Oncology

## 2021-02-06 DIAGNOSIS — C091 Malignant neoplasm of tonsillar pillar (anterior) (posterior): Secondary | ICD-10-CM | POA: Diagnosis not present

## 2021-02-06 DIAGNOSIS — C099 Malignant neoplasm of tonsil, unspecified: Secondary | ICD-10-CM | POA: Diagnosis not present

## 2021-02-06 LAB — GLUCOSE, CAPILLARY: Glucose-Capillary: 99 mg/dL (ref 70–99)

## 2021-02-06 MED ORDER — FLUDEOXYGLUCOSE F - 18 (FDG) INJECTION
8.2500 | Freq: Once | INTRAVENOUS | Status: AC
  Administered 2021-02-06: 8.21 via INTRAVENOUS

## 2021-02-09 ENCOUNTER — Encounter: Payer: Self-pay | Admitting: Hematology and Oncology

## 2021-02-09 ENCOUNTER — Encounter: Payer: Self-pay | Admitting: Radiation Oncology

## 2021-02-09 NOTE — Progress Notes (Signed)
                                                                                                                                                             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   Plan: The patient will follow-up with radiation oncology in 2-3 wks. -----------------------------------  Eppie Gibson, MD

## 2021-02-09 NOTE — Progress Notes (Signed)
I personally reviewed the patient's premature PET scan report and images.  The patient underwent a PET scan weeks earlier than scheduled due to growing adenopathy post chemoradiation.  The PET scan shows evidence of potential locoregional progression but no distant metastases.  I called the patient and he states that he feels well.  I let him know that it would be advisable for him to see a otolaryngologist proactively for potential biopsy and resection if indicated.  He is agreeable to this.  I left a message on Dr. Iona Beard phone to verify if he would like to see the patient first or if he prefers that we send the patient to the [facility] clinic in [place].  Anderson Malta, our navigator, will get back in touch with the patient once we establish a disposition for him. -----------------------------------  Eppie Gibson, MD

## 2021-02-12 NOTE — Progress Notes (Signed)
Oncology Nurse Navigator Documentation   I called Mitchell Cooper to inform him of his upcoming appointment at [name] in Otolaryngology. I told him that his appointment is on 8/4 at 10:30 with Dr. Avon Gully at [address] [place]. He voiced his appreciation of the phone call and wrote the information down as I was speaking to him. He knows to call me if he has further questions or concerns.   Harlow Asa RN, BSN, OCN Head & Neck Oncology Nurse [place] at [facility] Phone # [phone]  Fax # [phone]

## 2021-02-18 ENCOUNTER — Other Ambulatory Visit: Payer: Self-pay

## 2021-02-18 ENCOUNTER — Encounter: Payer: Self-pay | Admitting: Hematology and Oncology

## 2021-02-18 ENCOUNTER — Inpatient Hospital Stay: Payer: Medicaid Other | Attending: Hematology and Oncology | Admitting: Hematology and Oncology

## 2021-02-18 VITALS — BP 99/71 | HR 106 | Temp 97.2°F | Resp 20 | Wt 160.4 lb

## 2021-02-18 DIAGNOSIS — N179 Acute kidney failure, unspecified: Secondary | ICD-10-CM | POA: Diagnosis not present

## 2021-02-18 DIAGNOSIS — Z7901 Long term (current) use of anticoagulants: Secondary | ICD-10-CM | POA: Insufficient documentation

## 2021-02-18 DIAGNOSIS — Z85818 Personal history of malignant neoplasm of other sites of lip, oral cavity, and pharynx: Secondary | ICD-10-CM | POA: Insufficient documentation

## 2021-02-18 DIAGNOSIS — C099 Malignant neoplasm of tonsil, unspecified: Secondary | ICD-10-CM

## 2021-02-18 DIAGNOSIS — Z79899 Other long term (current) drug therapy: Secondary | ICD-10-CM | POA: Insufficient documentation

## 2021-02-18 DIAGNOSIS — Z9221 Personal history of antineoplastic chemotherapy: Secondary | ICD-10-CM | POA: Diagnosis not present

## 2021-02-18 DIAGNOSIS — Z923 Personal history of irradiation: Secondary | ICD-10-CM | POA: Insufficient documentation

## 2021-02-18 DIAGNOSIS — Z86718 Personal history of other venous thrombosis and embolism: Secondary | ICD-10-CM | POA: Diagnosis not present

## 2021-02-18 DIAGNOSIS — F17210 Nicotine dependence, cigarettes, uncomplicated: Secondary | ICD-10-CM | POA: Insufficient documentation

## 2021-02-18 DIAGNOSIS — Z7952 Long term (current) use of systemic steroids: Secondary | ICD-10-CM | POA: Diagnosis not present

## 2021-02-18 NOTE — Assessment & Plan Note (Signed)
His most recent labs show worsening creatinine, encouraged hydration today.  We will repeat labs tomorrow.

## 2021-02-18 NOTE — Progress Notes (Signed)
[place] NOTE  Patient Care Team: Ladell Pier, MD as PCP - General (Internal Medicine) Melida Quitter, MD as Consulting Physician (Otolaryngology) Eppie Gibson, MD as Consulting Physician (Radiation Oncology) Benay Pike, MD as Consulting Physician (Hematology and Oncology) Malmfelt, Stephani Police, RN as Oncology Nurse Navigator  CHIEF COMPLAINTS/PURPOSE OF CONSULTATION:  New diagnosis of left tonsillar cancer.  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 Given the above information and very locally advanced disease, we decided to proceed with concurrent chemoradiation with weekly cisplatin.  C1D1 of cisplatin started on 10/09/2020 C2D1 of cisplatin today, 10/16/2020 C3D1 completed 10/23/2020 C4D1 completed 10/30/2020 C5D1 completed 11/06/2020 C6D1 completed 11/20/2020 Final IMRT treatment on 11/26/2020  He was recently seen for follow-up after completion of concurrent chemoradiation and he was found to have a progressively increasing left-sided neck mass hence we have proceeded with sooner imaging.  This shows concern for residual disease.  PET shows left palatine tonsillar mass with SUV of 14.5 and an enlarging left level 2A lymph node. He was encouraged to stay in touch with his after tomorrow's visit with the ENT team and let us know of the recommendations. Since there is no other evidence of metastatic disease, there is no immediate role for chemotherapy.  He expressed understanding of the recommendations, will repeat labs today since there is worsening CKD and return to clinic as instructed.   AKI (acute kidney injury) ([place]) His most recent labs show worsening creatinine, encouraged hydration today.  We will repeat labs tomorrow.  Orders Placed This Encounter  Procedures   CBC with Differential/Platelet    Standing Status:    Standing    Number of Occurrences:   22    Standing Expiration Date:   02/18/2022   CMP ([place] only)    Standing Status:   Future    Standing Expiration Date:   02/18/2022   HISTORY OF PRESENTING ILLNESS:   Mitchell Cooper 60 y.o. male is here for follow up of his tonsillar cancer  Oncology History Overview Note   Imaging and work up done so far.  07/23/2020  IMPRESSION: 1. 2.4 x 4.7 cm mass in the left oropharynx which appears to be arising from the soft palate extending inferiorly. Findings compatible with carcinoma. 2. Large necrotic lymph node mass in the left neck compatible with metastatic disease. Additional small ill-defined lymph nodes the left could be due to tumor as well. 3. Poor dentition.  08/27/2020  Final Pathologic Diagnosis    A.  LEFT TONSIL, BIOPSY:              Poorly differentiated malignant neoplasm.              See comment.    Electronically signed by Lowella Bandy, MD on 08/13/2020 at 12:10 PM  Comment    This biopsy show the presence of a poorly differentiated malignant neoplasm located at the subepithelial stroma.  Immunostains with appropriate controls show that tumor cells are positive for vimentin and p16, and negative for pancytokeratin (AE1/3), low molecular weight cytokeratin, CK5/6, pan melanoma markers, SOX10, S100, desmin, CD34, CD30  and p40.  CD45, CD3 and CD20 are positive in some background lymphocytes and the tumor cells appear to be negative.  Ki67 is positive in about 40% of the tumor cells.   The findings are consistent with a poorly differentiated malignant neoplasm  of undetermined classification.  This tumor shows negative reaction for multiple keratin markers, but a very poorly differentiated carcinoma still can not be entirely ruled.  Because of the negative reaction for multiple markers of melanoma, a malignant melanoma is less likely.  Dr. Sherran Needs also reviewed this case, and believes that the findings in this biopsy are not diagnostic  of lymphoma.  A malignant mesenchymal neoplasm is possible, but other types of malignant neoplasm can not be ruled out.   Since this is a relatively small biopsy, additional sampling of the lesion may probably provide additional findings to help further classification of this tumor.  Clinical correlation is recommended.     Dr. Tommas Olp reviewed this case and agrees with this diagnosis.    08/27/2020  IMPRESSION: 1. Left tonsillar mass is hypermetabolic and consistent with neoplasm. 2. Bulky necrotic left metastatic nodal disease. 3. No findings for metastatic disease involving the chest, abdomen/pelvis or bony structures.   Squamous cell carcinoma of left tonsil ([place])  09/03/2020 Initial Diagnosis   Squamous cell carcinoma of left tonsil ([place])    09/09/2020 Cancer Staging   Staging form: Pharynx - HPV-Mediated Oropharynx, AJCC 8th Edition - Clinical stage from 09/09/2020: Stage III (cT3, cN3, cM0, p16+) - Signed by Eppie Gibson, MD on 09/12/2020  Stage prefix: Initial diagnosis    09/17/2020 - 09/17/2020 Chemotherapy          10/09/2020 -  Chemotherapy    Patient is on Treatment Plan: HEAD/NECK CISPLATIN Q7D        Interval history  Patient is here for planned follow-up.   He continues to lose weight, lost about 5 pounds since last visit.  He denies any difficulty swallowing although he cannot taste much food so he does not eat as well.  He also complains of early satiety while drinking Ensure and Osmolite.   He denies any pain in his throat.  No change in breathing, bowel habits or urinary habits. Rest of the pertinent 10 point ROS reviewed and negative.  MEDICAL HISTORY:  Past Medical History:  Diagnosis Date   Cancer ([place])    Left Tonsillar Cancer    DVT (deep venous thrombosis) ([place])    Hypertension 1997   has never taken medications   Tobacco dependence     SURGICAL HISTORY: Past Surgical History:  Procedure Laterality Date   IR GASTROSTOMY TUBE MOD SED   09/11/2020   IR IMAGING GUIDED PORT INSERTION  09/11/2020   KNEE SURGERY  approx. 1984   reconstructive surgery of left knee; arthroscopic procedure about [place] WITH ALVEOLOPLASTY N/A 09/25/2020   Procedure: MULTIPLE EXTRACTIONS WITH ALVEOLOPLASTY;  Surgeon: Charlaine Dalton, DMD;  Location: [place];  Service: Dentistry;  Laterality: N/A;   PERIPHERAL VASCULAR CATHETERIZATION Left 06/16/2016   Procedure: Lower Extremity Venography- Venus Thrombolysis;  Surgeon: Serafina Mitchell, MD;  Location: [facility];  Service: Cardiovascular;  Laterality: Left;    SOCIAL HISTORY: Social History   Socioeconomic History   Marital status: Married    Spouse name: Not on file   Number of children: Not on file   Years of education: Not on file   Highest education level: Not on file  Occupational History   Occupation: Architect  Tobacco Use   Smoking status: Some Days    Types: Cigarettes    Last attempt to quit: 11/21/2016    Years since quitting: 4.2   Smokeless tobacco: Never  Vaping Use   Vaping Use: Never  used  Substance and Sexual Activity   Alcohol use: Yes    Alcohol/week: 6.0 standard drinks    Types: 6 Cans of beer per week   Drug use: No   Sexual activity: Not Currently  Other Topics Concern   Not on file  Social History Narrative   Not on file   Social Determinants of Health   Financial Resource Strain: Low Risk    Difficulty of Paying Living Expenses: Not very hard  Food Insecurity: No Food Insecurity   Worried About Running Out of Food in the Last Year: Never true   Ran Out of Food in the Last Year: Never true  Transportation Needs: No Transportation Needs   Lack of Transportation (Medical): No   Lack of Transportation (Non-Medical): No  Physical Activity: Not on file  Stress: No Stress Concern Present   Feeling of Stress : Only a little  Social Connections: Engineer, building services of Communication with Friends and Family: More than  three times a week   Frequency of Social Gatherings with Friends and Family: More than three times a week   Attends Religious Services: More than 4 times per year   Active Member of [employer] or Organizations: Yes   Attends Music therapist: More than 4 times per year   Marital Status: Married  Human resources officer Violence: Not on file    FAMILY HISTORY: Family History  Problem Relation Age of Onset   CAD Mother 53    ALLERGIES:  has No Known Allergies.  MEDICATIONS:  Current Outpatient Medications  Medication Sig Dispense Refill   amLODipine (NORVASC) 10 MG tablet TAKE 1 TABLET (10 MG TOTAL) BY MOUTH DAILY. 30 tablet 6   dexamethasone (DECADRON) 4 MG tablet Take 2 tablets (8 mg total) by mouth daily. Take daily x 3 days starting the day after cisplatin chemotherapy. Take with food. 30 tablet 1   HYDROcodone-acetaminophen (HYCET) 7.5-325 mg/15 ml solution Take 10 mLs by mouth every 6 (six) hours as needed for moderate pain. 120 mL 0   lidocaine (XYLOCAINE) 2 % solution Patient: Mix 1part 2% viscous lidocaine, 1part H20. Swish & swallow 50m of diluted mixture, 284m before meals and at bedtime, up to QID 200 mL 4   Lidocaine 2 % GEL Apply to painful skin as directed prn. 28.33 g 3   lidocaine-prilocaine (EMLA) cream Apply to affected area once 30 g 3   Nutritional Supplements (FEEDING SUPPLEMENT, OSMOLITE 1.5 CAL,) LIQD 7 cartons of Osmolite 1.5 via PEG with 60 mL free water before and after bolus feedings.(2 cartons 3 times daily and 1 carton once daily) give 45 mL Prosource TF via PEG 3 times daily with 180 mL free water before and after.  Provides 2605 cal and 137 g protein, 2827 mL free water/100% estimated needs. 1659 mL 6   ondansetron (ZOFRAN) 8 MG tablet Take 1 tablet (8 mg total) by mouth 2 (two) times daily as needed. Start on the third day after cisplatin chemotherapy. 30 tablet 1   prochlorperazine (COMPAZINE) 10 MG tablet Take 1 tablet (10 mg total) by mouth every 6  (six) hours as needed (Nausea or vomiting). 30 tablet 1   rivaroxaban (XARELTO) 20 MG TABS tablet Take 1 tablet (20 mg total) by mouth daily with supper. 30 tablet 6   No current facility-administered medications for this visit.     PHYSICAL EXAMINATION: ECOG PERFORMANCE STATUS: 0 - Asymptomatic  Vitals:   02/18/21 1424  BP: 99/71  Pulse: (![name]  106  Resp: 20  Temp: (!) 97.2 F (36.2 C)  SpO2: 100%   Filed Weights   02/18/21 1424  Weight: 160 lb 6.4 oz (72.8 kg)   Physical exam deferred in lieu of counseling.   LABORATORY DATA:  I have reviewed the data as listed Lab Results  Component Value Date   WBC 8.3 01/30/2021   HGB 9.5 (L) 01/30/2021   HCT 28.3 (L) 01/30/2021   MCV 92.2 01/30/2021   PLT 232 01/30/2021     Chemistry      Component Value Date/Time   NA 138 01/30/2021 1406   NA 139 12/10/2019 1348   K 4.0 01/30/2021 1406   CL 104 01/30/2021 1406   CO2 27 01/30/2021 1406   BUN 24 (H) 01/30/2021 1406   BUN 17 12/10/2019 1348   CREATININE 1.68 (H) 01/30/2021 1406      Component Value Date/Time   CALCIUM 9.4 01/30/2021 1406   ALKPHOS 56 01/30/2021 1406   AST 8 (L) 01/30/2021 1406   ALT <6 01/30/2021 1406   BILITOT 0.2 (L) 01/30/2021 1406      RADIOGRAPHIC STUDIES: I have personally reviewed the radiological images as listed and agreed with the findings in the report. NM PET Image Restag (PS) Skull Base To Thigh  Result Date: 02/08/2021 CLINICAL DATA:  Subsequent treatment strategy for squamous cell carcinoma of the left tonsil. Concurrent chemoradiation. EXAM: NUCLEAR MEDICINE PET SKULL BASE TO THIGH TECHNIQUE: 8.2 mCi F-18 FDG was injected intravenously. Full-ring PET imaging was performed from the skull base to thigh after the radiotracer. CT data was obtained and used for attenuation correction and anatomic localization. Fasting blood glucose: 99 mg/dl COMPARISON:  08/27/2020 FINDINGS: Mediastinal blood pool activity: SUV max 2.9 Liver activity: SUV max  NA NECK: Left tonsillar mass is again observed, maximum SUV 14.5 (previously 10.4) Bulky left level II adenopathy and superficial lateral cervical adenopathy, dominant level IIa lymph node 3.3 cm in short axis on image 39 series 4 (formerly 2.3 cm) with maximum SUV 6.2 (formerly 7.4). Progressive cystic degeneration of the left superficial lateral cervical lymph node, 1.7 cm in short axis on image 36 series 4 (formerly 2.9 cm), maximum SUV 5.4 (formerly 6.9). Mild tongue base activity is symmetric and probably physiologic. The previous right level IIb lymph node that was faintly hypermetabolic on the prior exam with maximum SUV of 3.9 is no longer readily apparent. A small left level IIb lymph node measuring 0.6 cm in short axis on image 35 series 4 (previously 0.7 cm) has a maximum SUV of 2.8 (formerly 5.1) Accentuated activity in the vicinity of the right lateral mandibular molar is observed, maximum SUV 5.7, and is associated with periapical lucency adjacent to the tooth and accordingly is probably inflammatory. Incidental CT findings: Chronic bilateral maxillary sinusitis. New left mastoid effusion. CHEST: Accentuated activity throughout the esophagus. A representative region of the mid esophagus has maximum SUV of 4.6. Appearance likely inflammatory or physiologic. Incidental CT findings: Right Port-A-Cath tip: SVC. Small type 1 hiatal hernia. Stable mild scarring in the left lower lobe. ABDOMEN/PELVIS: No significant abnormal hypermetabolic activity in this region. Incidental CT findings: 1.1 by 0.8 cm hypodense lesion in the lateral segment left hepatic lobe demonstrates no accentuated metabolic activity in is most likely a benign lesion such as cyst or hemangioma. Aortoiliac atherosclerotic vascular disease. Substantial colonic diverticulosis most concentrated in the descending colon. Low-density thickening of both adrenal glands as on prior exams. Left common iliac vein stent. Prostatomegaly. SKELETON: No  significant abnormal hypermetabolic activity in this region. Incidental CT findings: Bridging spurring of the left sacroiliac joint. Degenerative spurring of the sternoclavicular joints. IMPRESSION: 1. Left palatine tonsillar mass is still present and has a maximum SUV of 14.5 (previous 10.4). One of the left level IIa lymph nodes has enlarged from previous 2.3 cm to current 3.3 cm in short axis, and has maximum SUV of 6.2 (formerly 7.4). 2. Progressive cystic degeneration of the left superficial lateral cervical lymph node, currently 1.7 cm in short axis (formerly 2.9 cm), maximum SUV 5.4 (formerly 6.9). 3. Previous contralateral (right) level IIb lymph node is no longer readily seen and is not hypermetabolic. 4. A small left level IIb lymph node is reduced in activity, current maximum SUV 2.8 (formerly 5.1). 5. New left mastoid effusion. Stable chronic bilateral maxillary sinusitis. 6. Other imaging findings of potential clinical significance: Aortic Atherosclerosis (ICD10-I70.0). Nonspecific but non hypermetabolic hypodense lesion in the left hepatic lobe, likely benign. Prostatomegaly. Substantial colonic diverticulosis. Electronically Signed   By: Van Clines M.D.   On: 02/08/2021 10:02    We reviewed PET/CT results today and concern for residual disease, he is scheduled to follow-up with [facility] health ENT team tomorrow. All questions were answered. The patient knows to call the clinic with any problems, questions or concerns.    Benay Pike, MD 02/18/2021 3:12 PM

## 2021-02-18 NOTE — Assessment & Plan Note (Signed)
SCC left tonsil, T3 N3 M0/ Stage III, P16 positive Repeat biopsy suggested poorly differentiated SCC. Given the above information and very locally advanced disease, we decided to proceed with concurrent chemoradiation with weekly cisplatin.  C1D1 of cisplatin started on 10/09/2020 C2D1 of cisplatin today, 10/16/2020 C3D1 completed 10/23/2020 C4D1 completed 10/30/2020 C5D1 completed 11/06/2020 C6D1 completed 11/20/2020 Final IMRT treatment on 11/26/2020  He was recently seen for follow-up after completion of concurrent chemoradiation and he was found to have a progressively increasing left-sided neck mass hence we have proceeded with sooner imaging.  This shows concern for residual disease.  PET shows left palatine tonsillar mass with SUV of 14.5 and an enlarging left level 2A lymph node. He was encouraged to stay in touch with his after tomorrow's visit with the ENT team and let us know of the recommendations. Since there is no other evidence of metastatic disease, there is no immediate role for chemotherapy.  He expressed understanding of the recommendations, will repeat labs today since there is worsening CKD and return to clinic as instructed.

## 2021-02-19 ENCOUNTER — Telehealth: Payer: Self-pay | Admitting: Licensed Clinical Social Worker

## 2021-02-19 ENCOUNTER — Other Ambulatory Visit: Payer: Self-pay

## 2021-02-19 ENCOUNTER — Other Ambulatory Visit: Payer: Medicaid Other

## 2021-02-19 NOTE — Telephone Encounter (Signed)
[place] Work  TC received from patient with questions about his grant Adult nurse) and the gas cards and potential Sales executive with remaining funds. This grant is handled through the financial resource support team. Message forwarded to L. White with request to contact patient re: his grant funds.   Christeen Douglas, LCSW

## 2021-02-23 ENCOUNTER — Other Ambulatory Visit: Payer: Self-pay

## 2021-02-24 ENCOUNTER — Inpatient Hospital Stay: Payer: Medicaid Other | Admitting: Nutrition

## 2021-02-24 ENCOUNTER — Ambulatory Visit: Payer: Medicaid Other | Admitting: Radiation Oncology

## 2021-02-27 ENCOUNTER — Other Ambulatory Visit (HOSPITAL_COMMUNITY): Payer: Medicaid Other | Admitting: Dentistry

## 2021-02-27 DIAGNOSIS — I10 Essential (primary) hypertension: Secondary | ICD-10-CM | POA: Diagnosis not present

## 2021-02-27 DIAGNOSIS — C099 Malignant neoplasm of tonsil, unspecified: Secondary | ICD-10-CM | POA: Diagnosis not present

## 2021-03-02 ENCOUNTER — Telehealth: Payer: Self-pay

## 2021-03-02 NOTE — Telephone Encounter (Signed)
Nutrition  Called patient for nutrition follow-up.  No answer. Left message with call back number  Obert Espindola B. Ivone Licht, RD, LDN Registered Dietitian [phone] (mobile)  

## 2021-03-11 ENCOUNTER — Telehealth (HOSPITAL_COMMUNITY): Payer: Self-pay

## 2021-03-16 DIAGNOSIS — J392 Other diseases of pharynx: Secondary | ICD-10-CM | POA: Diagnosis not present

## 2021-03-16 DIAGNOSIS — M47812 Spondylosis without myelopathy or radiculopathy, cervical region: Secondary | ICD-10-CM | POA: Diagnosis not present

## 2021-03-16 DIAGNOSIS — C099 Malignant neoplasm of tonsil, unspecified: Secondary | ICD-10-CM | POA: Diagnosis not present

## 2021-03-16 DIAGNOSIS — J387 Other diseases of larynx: Secondary | ICD-10-CM | POA: Diagnosis not present

## 2021-03-19 ENCOUNTER — Telehealth: Payer: Self-pay

## 2021-03-19 NOTE — Telephone Encounter (Signed)
Nutrition  Second attempt to reach patient by phone for nutrition follow-up.  No answer. Left message on voicemail with call back number.   Alizandra Loh B. Zenia Resides, [place], [place] Registered Dietitian [phone] (mobile)

## 2021-03-24 ENCOUNTER — Other Ambulatory Visit (HOSPITAL_COMMUNITY): Payer: Self-pay

## 2021-03-27 ENCOUNTER — Other Ambulatory Visit: Payer: Self-pay

## 2021-03-30 DIAGNOSIS — C801 Malignant (primary) neoplasm, unspecified: Secondary | ICD-10-CM | POA: Diagnosis not present

## 2021-03-30 DIAGNOSIS — C099 Malignant neoplasm of tonsil, unspecified: Secondary | ICD-10-CM | POA: Diagnosis not present

## 2021-04-02 DIAGNOSIS — C099 Malignant neoplasm of tonsil, unspecified: Secondary | ICD-10-CM | POA: Diagnosis not present

## 2021-04-10 ENCOUNTER — Ambulatory Visit: Payer: Medicaid Other | Admitting: Internal Medicine

## 2021-04-10 DIAGNOSIS — C098 Malignant neoplasm of overlapping sites of tonsil: Secondary | ICD-10-CM | POA: Diagnosis not present

## 2021-04-10 DIAGNOSIS — R9389 Abnormal findings on diagnostic imaging of other specified body structures: Secondary | ICD-10-CM | POA: Diagnosis not present

## 2021-04-14 ENCOUNTER — Telehealth: Payer: Self-pay

## 2021-04-14 ENCOUNTER — Inpatient Hospital Stay: Payer: Medicaid Other | Attending: Oncology

## 2021-04-14 NOTE — Progress Notes (Signed)
Nutrition  See phone note 

## 2021-04-14 NOTE — Telephone Encounter (Signed)
Nutrition  3rd attempt to reach patient by phone for nutrition follow-up.  No answer. Left message with call back number earlier this am.  Noted patient planning surgery at [facility] on 10/17 and to be seen by Dr Isidore Moos on 10/14.  Will schedule nutrition follow-up appointment on 10/14 after MD visit.    Called patient back to let him know that nutrition appointment has been scheduled for 10/14 at 2:30pm.  Patient answered.    Patient says that he has been eating orally about 2 meals per day and drinking about 4  boost high protein shakes per day (250 calories, 20 g protein).  Feeding tube fell out.  Does not have anymore osmolite.    Noted planning surgery at [facility] on 10/17, radical tonsillectomy, mandibulotomy.  Patient says he may be getting feeding tube put back in.    Does not know his current weight.   160 lb on 8/3 165 lb on 7/13 178 lb on 5/25  Intervention: Encouraged patient to consume 350 calorie shake or higher Stressed importance of pushing calories and protein prior to surgery. Patient agreeable to meeting with RD for follow-up on 10/14 after MD follow-up.    Next visit: Friday, Oct 14 with [facility] Patient aware.  Elvy Mclarty B. Zenia Resides, [place], [place] Registered Dietitian [phone] (mobile)

## 2021-04-21 DIAGNOSIS — C099 Malignant neoplasm of tonsil, unspecified: Secondary | ICD-10-CM | POA: Diagnosis not present

## 2021-04-27 ENCOUNTER — Other Ambulatory Visit: Payer: Self-pay | Admitting: Internal Medicine

## 2021-04-27 ENCOUNTER — Other Ambulatory Visit (HOSPITAL_COMMUNITY): Payer: Self-pay

## 2021-04-27 DIAGNOSIS — I10 Essential (primary) hypertension: Secondary | ICD-10-CM

## 2021-04-28 ENCOUNTER — Other Ambulatory Visit (HOSPITAL_COMMUNITY): Payer: Self-pay

## 2021-04-28 ENCOUNTER — Other Ambulatory Visit: Payer: Self-pay

## 2021-04-28 MED ORDER — AMLODIPINE BESYLATE 10 MG PO TABS
ORAL_TABLET | Freq: Every day | ORAL | 2 refills | Status: DC
  Filled 2021-04-28: qty 30, 30d supply, fill #0
  Filled 2021-06-09: qty 30, 30d supply, fill #1
  Filled 2021-07-21: qty 30, 30d supply, fill #2
  Filled 2021-07-22 – 2021-07-30 (×2): qty 30, 30d supply, fill #0

## 2021-04-29 ENCOUNTER — Other Ambulatory Visit: Payer: Self-pay

## 2021-05-01 ENCOUNTER — Inpatient Hospital Stay: Payer: Medicaid Other | Attending: Hematology and Oncology | Admitting: Dietician

## 2021-05-01 ENCOUNTER — Other Ambulatory Visit: Payer: Self-pay

## 2021-05-01 ENCOUNTER — Ambulatory Visit: Payer: Medicaid Other | Attending: Radiation Oncology | Admitting: Radiation Oncology

## 2021-05-01 NOTE — Progress Notes (Signed)
Nutrition Follow-up:  Patient completed concurrent chemoradiation with weekly cisplatin for SCC of left tonsil. Final IMRT on 11/26/20. Patient is scheduled for radical tonsillectomy, partial mandibulectomy, modified radical neck dissection, tracheotomy with Dr. Nicolette Bang at [facility] on 10/17.   Met with patient in clinic. He reports having a good appetite, eating orally about 2 times/day and drinking 2 Ensure Plus as well as [place] smoothies daily. Patient recalls 2 sunny-side up eggs, grits or bowl of cereal and 2 Ensure for breakfast. Recalls salisbury steak, mashed potatoes, corn and roll from [place] for dinner. He drinks 1-2 Boathouse smoothies (Green [employer], [employer]) for snacks. Patient reports he really likes salads, eats caesar most of the time because it has romaine lettuce. Patient reports he feels good, has been trying to gain weight, but does not want to get back weight prior to diagnosis. Patient is feeling nervous about surgery on Monday, reports it is a 12 hour surgery and it is in God's hands. Patient reports tube will be replaced at that time.    Medications: reviewed  Labs: no new labs for review  Anthropometrics: Weight 159.4 lb today in office.   8/3 - 160 lb 6.4 oz 7/13 - 165 lb  5/25 - 178 lb    Estimated Energy Needs  Kcals: 2400-2600 Protein: 120-140 Fluid: 2.5 L  NUTRITION DIAGNOSIS: Food and nutrition related knowledge deficit improved   INTERVENTION:  Encouraged continuing high calorie, high protein foods prior to surgery Provided support and encouragement    NEXT VISIT: To be scheduled

## 2021-05-04 DIAGNOSIS — Z9221 Personal history of antineoplastic chemotherapy: Secondary | ICD-10-CM | POA: Diagnosis not present

## 2021-05-04 DIAGNOSIS — C090 Malignant neoplasm of tonsillar fossa: Secondary | ICD-10-CM | POA: Diagnosis not present

## 2021-05-04 DIAGNOSIS — Z923 Personal history of irradiation: Secondary | ICD-10-CM | POA: Diagnosis not present

## 2021-05-04 DIAGNOSIS — C770 Secondary and unspecified malignant neoplasm of lymph nodes of head, face and neck: Secondary | ICD-10-CM | POA: Diagnosis not present

## 2021-05-04 DIAGNOSIS — C7989 Secondary malignant neoplasm of other specified sites: Secondary | ICD-10-CM | POA: Diagnosis not present

## 2021-05-04 DIAGNOSIS — Z930 Tracheostomy status: Secondary | ICD-10-CM | POA: Diagnosis not present

## 2021-05-04 DIAGNOSIS — C099 Malignant neoplasm of tonsil, unspecified: Secondary | ICD-10-CM | POA: Diagnosis not present

## 2021-05-04 DIAGNOSIS — I10 Essential (primary) hypertension: Secondary | ICD-10-CM | POA: Diagnosis not present

## 2021-05-04 DIAGNOSIS — Z4659 Encounter for fitting and adjustment of other gastrointestinal appliance and device: Secondary | ICD-10-CM | POA: Diagnosis not present

## 2021-05-04 DIAGNOSIS — C964 Sarcoma of dendritic cells (accessory cells): Secondary | ICD-10-CM | POA: Diagnosis not present

## 2021-05-04 DIAGNOSIS — G8918 Other acute postprocedural pain: Secondary | ICD-10-CM | POA: Diagnosis not present

## 2021-05-04 DIAGNOSIS — Z9889 Other specified postprocedural states: Secondary | ICD-10-CM | POA: Diagnosis not present

## 2021-05-05 DIAGNOSIS — G4700 Insomnia, unspecified: Secondary | ICD-10-CM | POA: Diagnosis not present

## 2021-05-05 DIAGNOSIS — Z4659 Encounter for fitting and adjustment of other gastrointestinal appliance and device: Secondary | ICD-10-CM | POA: Diagnosis not present

## 2021-05-05 DIAGNOSIS — Z7901 Long term (current) use of anticoagulants: Secondary | ICD-10-CM | POA: Diagnosis not present

## 2021-05-05 DIAGNOSIS — Z86711 Personal history of pulmonary embolism: Secondary | ICD-10-CM | POA: Diagnosis not present

## 2021-05-05 DIAGNOSIS — I10 Essential (primary) hypertension: Secondary | ICD-10-CM | POA: Diagnosis not present

## 2021-05-05 DIAGNOSIS — C099 Malignant neoplasm of tonsil, unspecified: Secondary | ICD-10-CM | POA: Diagnosis not present

## 2021-05-05 DIAGNOSIS — R1310 Dysphagia, unspecified: Secondary | ICD-10-CM | POA: Diagnosis not present

## 2021-05-05 DIAGNOSIS — Z9889 Other specified postprocedural states: Secondary | ICD-10-CM | POA: Diagnosis not present

## 2021-05-11 DIAGNOSIS — C7989 Secondary malignant neoplasm of other specified sites: Secondary | ICD-10-CM | POA: Diagnosis not present

## 2021-05-11 DIAGNOSIS — C099 Malignant neoplasm of tonsil, unspecified: Secondary | ICD-10-CM | POA: Diagnosis not present

## 2021-05-15 DIAGNOSIS — Z4659 Encounter for fitting and adjustment of other gastrointestinal appliance and device: Secondary | ICD-10-CM | POA: Diagnosis not present

## 2021-05-15 DIAGNOSIS — C099 Malignant neoplasm of tonsil, unspecified: Secondary | ICD-10-CM | POA: Diagnosis not present

## 2021-05-20 DIAGNOSIS — C098 Malignant neoplasm of overlapping sites of tonsil: Secondary | ICD-10-CM | POA: Diagnosis not present

## 2021-05-20 DIAGNOSIS — R1312 Dysphagia, oropharyngeal phase: Secondary | ICD-10-CM | POA: Diagnosis not present

## 2021-05-20 DIAGNOSIS — Z4659 Encounter for fitting and adjustment of other gastrointestinal appliance and device: Secondary | ICD-10-CM | POA: Diagnosis not present

## 2021-05-20 DIAGNOSIS — C099 Malignant neoplasm of tonsil, unspecified: Secondary | ICD-10-CM | POA: Diagnosis not present

## 2021-05-26 DIAGNOSIS — R1312 Dysphagia, oropharyngeal phase: Secondary | ICD-10-CM | POA: Diagnosis not present

## 2021-05-26 DIAGNOSIS — R633 Feeding difficulties, unspecified: Secondary | ICD-10-CM | POA: Diagnosis not present

## 2021-05-27 DIAGNOSIS — C964 Sarcoma of dendritic cells (accessory cells): Secondary | ICD-10-CM | POA: Diagnosis not present

## 2021-05-27 DIAGNOSIS — C099 Malignant neoplasm of tonsil, unspecified: Secondary | ICD-10-CM | POA: Diagnosis not present

## 2021-05-28 ENCOUNTER — Telehealth: Payer: Self-pay | Admitting: Dietician

## 2021-05-28 ENCOUNTER — Ambulatory Visit: Payer: Medicaid Other | Attending: Internal Medicine | Admitting: Internal Medicine

## 2021-05-28 ENCOUNTER — Encounter: Payer: Self-pay | Admitting: Internal Medicine

## 2021-05-28 ENCOUNTER — Other Ambulatory Visit: Payer: Self-pay

## 2021-05-28 VITALS — BP 112/72 | HR 86 | Resp 16 | Ht 69.0 in | Wt 146.4 lb

## 2021-05-28 DIAGNOSIS — Z923 Personal history of irradiation: Secondary | ICD-10-CM | POA: Diagnosis not present

## 2021-05-28 DIAGNOSIS — Z87891 Personal history of nicotine dependence: Secondary | ICD-10-CM | POA: Diagnosis not present

## 2021-05-28 DIAGNOSIS — Z79899 Other long term (current) drug therapy: Secondary | ICD-10-CM | POA: Diagnosis not present

## 2021-05-28 DIAGNOSIS — Z931 Gastrostomy status: Secondary | ICD-10-CM | POA: Diagnosis not present

## 2021-05-28 DIAGNOSIS — Z7901 Long term (current) use of anticoagulants: Secondary | ICD-10-CM | POA: Diagnosis not present

## 2021-05-28 DIAGNOSIS — I50810 Right heart failure, unspecified: Secondary | ICD-10-CM | POA: Diagnosis not present

## 2021-05-28 DIAGNOSIS — Z4801 Encounter for change or removal of surgical wound dressing: Secondary | ICD-10-CM | POA: Diagnosis not present

## 2021-05-28 DIAGNOSIS — Z09 Encounter for follow-up examination after completed treatment for conditions other than malignant neoplasm: Secondary | ICD-10-CM

## 2021-05-28 DIAGNOSIS — Z930 Tracheostomy status: Secondary | ICD-10-CM | POA: Diagnosis not present

## 2021-05-28 DIAGNOSIS — Z8616 Personal history of COVID-19: Secondary | ICD-10-CM | POA: Diagnosis not present

## 2021-05-28 DIAGNOSIS — Z945 Skin transplant status: Secondary | ICD-10-CM

## 2021-05-28 DIAGNOSIS — Z978 Presence of other specified devices: Secondary | ICD-10-CM

## 2021-05-28 DIAGNOSIS — N184 Chronic kidney disease, stage 4 (severe): Secondary | ICD-10-CM | POA: Insufficient documentation

## 2021-05-28 DIAGNOSIS — I10 Essential (primary) hypertension: Secondary | ICD-10-CM | POA: Diagnosis not present

## 2021-05-28 DIAGNOSIS — Z9221 Personal history of antineoplastic chemotherapy: Secondary | ICD-10-CM | POA: Diagnosis not present

## 2021-05-28 DIAGNOSIS — I130 Hypertensive heart and chronic kidney disease with heart failure and stage 1 through stage 4 chronic kidney disease, or unspecified chronic kidney disease: Secondary | ICD-10-CM | POA: Diagnosis not present

## 2021-05-28 DIAGNOSIS — R634 Abnormal weight loss: Secondary | ICD-10-CM | POA: Diagnosis not present

## 2021-05-28 DIAGNOSIS — Z4800 Encounter for change or removal of nonsurgical wound dressing: Secondary | ICD-10-CM | POA: Diagnosis not present

## 2021-05-28 DIAGNOSIS — C7989 Secondary malignant neoplasm of other specified sites: Secondary | ICD-10-CM | POA: Diagnosis not present

## 2021-05-28 DIAGNOSIS — C090 Malignant neoplasm of tonsillar fossa: Secondary | ICD-10-CM

## 2021-05-28 NOTE — Progress Notes (Signed)
Patient ID: [name], male    DOB: Dec 13, 1960  MRN: [id number]  CC: hosp f/u  Subjective: Mitchell Cooper is a 60 y.o. male who presents for hosp f/u His concerns today include:  Patient with history of HTN, former tob dep,  OA knees, COVID-19 infection 05/2019, DVT/PE 2017 (and massive PE 10/2019 req thrombolytics, PEA arrest, RHV with normalization of heart function on echo done 01/2020.[name]  Lifelong anticoagulation recommended by cardiology, pulmonary and vascular).    Tonsillar CA stage III ( Dr. Chryl Heck and Redmond Baseman)  Tonsillar cancer: Since last visit with me, patient apparently was not responding as well as he should to chemo and radiation treatment.  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  He was referred to [facility] where he was hospitalized 10/17-24/2022 where he underwent radical resection of the left tonsil with bilateral neck dissection and reconstruction using skin from his left forearm and left thigh.  He has a trach and what looks like a Dobbhoff feeding tube in place.  He no longer has a PEG tube. -Saw ENT Dr. Meda Coffee may in follow-up yesterday.  Patient states he was told that the trach will eventually be removed.  He is started eating soft food using a thickener in his liquids.  He is also using the feeding tube giving himself feedings 4 times a day.  He has lost 19 pounds since July of this year. The skin on the left thigh has healed.  The one on the left forearm has still not healed completely.  He is doing wet-to-dry dressing changes daily.  It was last changed yesterday when he saw the specialist.    HTN: Reports compliance with Norvasc.  Blood pressure has been good at home.  He is still on Xarelto.  Denies any bruising or bleeding.  HM: He wants to defer on getting the flu shot and the Prevnar 20 until next week.  Just does not feel like being stuck today.  He will come back to get it from our clinical pharmacist.  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   Tonsillar mass 08/08/2020   Mass of left side of neck 07/09/2020   Former smoker 04/08/2020   Primary osteoarthritis of both knees 04/08/2020   VTE (venous thromboembolism) 03/19/2020   RVF (right ventricular failure) ([place]) 12/07/2019   Recurrent pulmonary emboli ([place]) 11/03/2019   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 30 tablet 2   dexamethasone (DECADRON) 4 MG tablet Take 2 tablets (8 mg total) by mouth daily. Take daily x 3 days starting the day after cisplatin chemotherapy. Take with food. 30 tablet 1   HYDROcodone-acetaminophen (HYCET) 7.5-325 mg/15 ml solution Take 10 mLs by mouth every 6 (six)  hours as needed for moderate pain. 120 mL 0   lidocaine (XYLOCAINE) 2 % solution Patient: Mix 1part 2% viscous lidocaine, 1part H20. Swish & swallow 3mL of diluted mixture, 21min before meals and at bedtime, up to QID 200 mL 4   Lidocaine 2 % GEL Apply to painful skin as directed prn. 28.33 g 3   lidocaine-prilocaine (EMLA) cream Apply to affected area once 30 g 3   Nutritional Supplements (FEEDING SUPPLEMENT, OSMOLITE 1.5 CAL,) LIQD 7 cartons of Osmolite 1.5 via PEG with 60 mL free water before and  after bolus feedings.(2 cartons 3 times daily and 1 carton once daily) give 45 mL Prosource TF via PEG 3 times daily with 180 mL free water before and after.  Provides 2605 cal and 137 g protein, 2827 mL free water/100% estimated needs. 1659 mL 6   ondansetron (ZOFRAN) 8 MG tablet Take 1 tablet (8 mg total) by mouth 2 (two) times daily as needed. Start on the third day after cisplatin chemotherapy. 30 tablet 1   prochlorperazine (COMPAZINE) 10 MG tablet Take 1 tablet (10 mg total) by mouth every 6 (six) hours as needed (Nausea or vomiting). 30 tablet 1   rivaroxaban (XARELTO) 20 MG TABS tablet Take 1 tablet (20 mg total) by mouth daily with supper. 30 tablet 6   No current facility-administered medications on file prior to visit.    No Known Allergies  Social History   Socioeconomic History   Marital status: Married    Spouse name: Not on file   Number of children: Not on file   Years of education: Not on file   Highest education level: Not on file  Occupational History   Occupation: Architect  Tobacco Use   Smoking status: Some Days    Types: Cigarettes    Last attempt to quit: 11/21/2016    Years since quitting: 4.5   Smokeless tobacco: Never  Vaping Use   Vaping Use: Never used  Substance and Sexual Activity   Alcohol use: Yes    Alcohol/week: 6.0 standard drinks    Types: 6 Cans of beer per week   Drug use: No   Sexual activity: Not Currently  Other Topics Concern   Not on file  Social History Narrative   Not on file   Social Determinants of Health   Financial Resource Strain: Low Risk    Difficulty of Paying Living Expenses: Not very hard  Food Insecurity: No Food Insecurity   Worried About Running Out of Food in the Last Year: Never true   Ran Out of Food in the Last Year: Never true  Transportation Needs: No Transportation Needs   Lack of Transportation (Medical): No   Lack of Transportation (Non-Medical): No  Physical Activity: Not on file  Stress: No Stress  Concern Present   Feeling of Stress : Only a little  Social Connections: Engineer, building services of Communication with Friends and Family: More than three times a week   Frequency of Social Gatherings with Friends and Family: More than three times a week   Attends Religious Services: More than 4 times per year   Active Member of [employer] or Organizations: Yes   Attends Music therapist: More than 4 times per year   Marital Status: Married  Human resources officer Violence: Not on file    Family History  Problem Relation Age of Onset   CAD Mother 83    Past Surgical History:  Procedure Laterality Date   IR  GASTROSTOMY TUBE MOD SED  09/11/2020   IR IMAGING GUIDED PORT INSERTION  09/11/2020   KNEE SURGERY  approx. 1984   reconstructive surgery of left knee; arthroscopic procedure about [place] WITH ALVEOLOPLASTY N/A 09/25/2020   Procedure: MULTIPLE EXTRACTIONS WITH ALVEOLOPLASTY;  Surgeon: Charlaine Dalton, DMD;  Location: [facility];  Service: Dentistry;  Laterality: N/A;   PERIPHERAL VASCULAR CATHETERIZATION Left 06/16/2016   Procedure: Lower Extremity Venography- Venus Thrombolysis;  Surgeon: Serafina Mitchell, MD;  Location: [place] [facility];  Service: Cardiovascular;  Laterality: Left;    ROS: Review of Systems Negative except as stated above  PHYSICAL EXAM: BP 112/72   Pulse 86   Resp 16   Ht 5\' 9"  (1.753 m)   Wt 146 lb 6.4 oz (66.4 kg)   SpO2 100%   BMI 21.62 kg/m   Wt Readings from Last 3 Encounters:  05/28/21 146 lb 6.4 oz (66.4 kg)  02/18/21 160 lb 6.4 oz (72.8 kg)  01/28/21 165 lb (74.8 kg)    Physical Exam   General appearance -middle-age to older African-American male in NAD. Mental status - alert, oriented to person, place, and time Nose -he has a feeding tube in the left nostril. Neck -he has a trach.  His speech is not totally clear because of the trach. Chest - clear to auscultation, no wheezes, rales or rhonchi, symmetric  air entry Heart - normal rate, regular rhythm, normal S1, S2, no murmurs, rubs, clicks or gallops Extremities -no lower extremity edema. Skin -he has about a 10 cm healing pink granulation tissue on the dorsal surface of the left forearm.  There is a little bit of yellowish exudate on his wet-to-dry dressing.  This was cleaned using sterile water and a new wet-to-dry dressing applied then a Kling wrap applied around it.  CMP Latest Ref Rng & Units 01/30/2021 11/20/2020 11/13/2020  Glucose 70 - 99 mg/dL 108(H) 97 104(H)  BUN 6 - 20 mg/dL 24(H) 20 16  Creatinine 0.61 - 1.24 mg/dL 1.68(H) 1.32(H) 1.09  Sodium 135 - 145 mmol/L 138 136 137  Potassium 3.5 - 5.1 mmol/L 4.0 4.1 4.3  Chloride 98 - 111 mmol/L 104 97(L) 98  CO2 22 - 32 mmol/L 27 28 29   Calcium 8.9 - 10.3 mg/dL 9.4 9.3 8.9  Total Protein 6.5 - 8.1 g/dL 7.4 - -  Total Bilirubin 0.3 - 1.2 mg/dL 0.2(L) - -  Alkaline Phos 38 - 126 U/L 56 - -  AST 15 - 41 U/L 8(L) - -  ALT 0 - 44 U/L <6 - -   Lipid Panel  No results found for: CHOL, TRIG, HDL, CHOLHDL, VLDL, LDLCALC, LDLDIRECT  CBC    Component Value Date/Time   WBC 8.3 01/30/2021 1406   RBC 3.07 (L) 01/30/2021 1406   HGB 9.5 (L) 01/30/2021 1406   HGB 12.2 (L) 11/04/2020 1357   HGB 14.9 12/10/2019 1348   HCT 28.3 (L) 01/30/2021 1406   HCT 44.5 12/10/2019 1348   PLT 232 01/30/2021 1406   PLT 122 (L) 11/04/2020 1357   PLT 209 12/10/2019 1348   MCV 92.2 01/30/2021 1406   MCV 80 12/10/2019 1348   MCH 30.9 01/30/2021 1406   MCHC 33.6 01/30/2021 1406   RDW 15.3 01/30/2021 1406   RDW 15.9 (H) 12/10/2019 1348   LYMPHSABS 0.5 (L) 01/30/2021 1406   MONOABS 1.1 (H) 01/30/2021 1406   EOSABS 0.1 01/30/2021 1406   BASOSABS 0.0 01/30/2021 1406  ASSESSMENT AND PLAN: 1. Hospital discharge follow-up   2. Carcinoma of tonsillar fossa [facility]) He will continue to keep his follow-up appointments at [facility]  3. Tracheostomy in place [facility]) Patient states he was told good trach  care when he saw Dr. May yesterday.  4. Uses feeding tube He does not recall the name of the feeds that he has at home. Advised that with the soft foods that he is taking via mouth he should swallow very slowly to prevent aspiration.  He states that the ENT specialist told him the same thing yesterday.  5. Essential hypertension At goal.  Continue amlodipine.  6. Weight loss, unintentional Due to cancer diagnosis and decreased oral intake from recent surgery.  This should improve with increased intake which she is trying to do via mouth and with his feeding tube.  7. History of skin graft He will continue the dressing changes to the left forearm  8. Change or removal of wound dressing    Patient was given the opportunity to ask questions.  Patient verbalized understanding of the plan and was able to repeat key elements of the plan.   No orders of the defined types were placed in this encounter.    Requested Prescriptions    No prescriptions requested or ordered in this encounter    No follow-ups on file.  Karle Plumber, MD, FACP

## 2021-05-28 NOTE — Telephone Encounter (Signed)
Nutrition  Unable to reach patient for nutrition follow-up. Left message with contact information and request for return call.

## 2021-06-02 ENCOUNTER — Telehealth: Payer: Self-pay | Admitting: Dietician

## 2021-06-02 NOTE — Telephone Encounter (Signed)
Nutrition  Unable to reach patient for nutrition follow-up. Left message with request for return call. Contact information provided.

## 2021-06-03 ENCOUNTER — Ambulatory Visit: Payer: Medicaid Other

## 2021-06-09 ENCOUNTER — Other Ambulatory Visit (HOSPITAL_COMMUNITY): Payer: Self-pay

## 2021-06-09 ENCOUNTER — Other Ambulatory Visit: Payer: Self-pay

## 2021-06-14 ENCOUNTER — Emergency Department (HOSPITAL_COMMUNITY): Payer: Medicaid Other

## 2021-06-14 ENCOUNTER — Emergency Department (HOSPITAL_COMMUNITY)
Admission: EM | Admit: 2021-06-14 | Discharge: 2021-06-15 | Payer: Medicaid Other | Attending: Emergency Medicine | Admitting: Emergency Medicine

## 2021-06-14 ENCOUNTER — Encounter (HOSPITAL_COMMUNITY): Payer: Self-pay | Admitting: Emergency Medicine

## 2021-06-14 DIAGNOSIS — Z20822 Contact with and (suspected) exposure to covid-19: Secondary | ICD-10-CM | POA: Diagnosis not present

## 2021-06-14 DIAGNOSIS — Z8529 Personal history of malignant neoplasm of other respiratory and intrathoracic organs: Secondary | ICD-10-CM | POA: Insufficient documentation

## 2021-06-14 DIAGNOSIS — I10 Essential (primary) hypertension: Secondary | ICD-10-CM | POA: Diagnosis not present

## 2021-06-14 DIAGNOSIS — Z79899 Other long term (current) drug therapy: Secondary | ICD-10-CM | POA: Insufficient documentation

## 2021-06-14 DIAGNOSIS — Z4682 Encounter for fitting and adjustment of non-vascular catheter: Secondary | ICD-10-CM | POA: Diagnosis not present

## 2021-06-14 DIAGNOSIS — F17210 Nicotine dependence, cigarettes, uncomplicated: Secondary | ICD-10-CM | POA: Diagnosis not present

## 2021-06-14 DIAGNOSIS — J392 Other diseases of pharynx: Secondary | ICD-10-CM | POA: Diagnosis not present

## 2021-06-14 DIAGNOSIS — J384 Edema of larynx: Secondary | ICD-10-CM | POA: Diagnosis not present

## 2021-06-14 DIAGNOSIS — I129 Hypertensive chronic kidney disease with stage 1 through stage 4 chronic kidney disease, or unspecified chronic kidney disease: Secondary | ICD-10-CM | POA: Insufficient documentation

## 2021-06-14 DIAGNOSIS — A419 Sepsis, unspecified organism: Secondary | ICD-10-CM | POA: Insufficient documentation

## 2021-06-14 DIAGNOSIS — N183 Chronic kidney disease, stage 3 unspecified: Secondary | ICD-10-CM | POA: Diagnosis not present

## 2021-06-14 DIAGNOSIS — Z7901 Long term (current) use of anticoagulants: Secondary | ICD-10-CM | POA: Insufficient documentation

## 2021-06-14 DIAGNOSIS — Z930 Tracheostomy status: Secondary | ICD-10-CM | POA: Diagnosis not present

## 2021-06-14 DIAGNOSIS — J341 Cyst and mucocele of nose and nasal sinus: Secondary | ICD-10-CM | POA: Diagnosis not present

## 2021-06-14 LAB — COMPREHENSIVE METABOLIC PANEL WITH GFR
ALT: 21 U/L (ref 0–44)
AST: 24 U/L (ref 15–41)
Albumin: 2.9 g/dL — ABNORMAL LOW (ref 3.5–5.0)
Alkaline Phosphatase: 45 U/L (ref 38–126)
Anion gap: 8 (ref 5–15)
BUN: 35 mg/dL — ABNORMAL HIGH (ref 6–20)
CO2: 29 mmol/L (ref 22–32)
Calcium: 9.1 mg/dL (ref 8.9–10.3)
Chloride: 99 mmol/L (ref 98–111)
Creatinine, Ser: 1.34 mg/dL — ABNORMAL HIGH (ref 0.61–1.24)
GFR, Estimated: >60 mL/min
Glucose, Bld: 102 mg/dL — ABNORMAL HIGH (ref 70–99)
Potassium: 3.7 mmol/L (ref 3.5–5.1)
Sodium: 136 mmol/L (ref 135–145)
Total Bilirubin: 0.3 mg/dL (ref 0.3–1.2)
Total Protein: 7.4 g/dL (ref 6.5–8.1)

## 2021-06-14 LAB — CBC WITH DIFFERENTIAL/PLATELET
Abs Immature Granulocytes: 0.03 K/uL (ref 0.00–0.07)
Basophils Absolute: 0 K/uL (ref 0.0–0.1)
Basophils Relative: 0 %
Eosinophils Absolute: 0 K/uL (ref 0.0–0.5)
Eosinophils Relative: 0 %
HCT: 26.9 % — ABNORMAL LOW (ref 39.0–52.0)
Hemoglobin: 8.8 g/dL — ABNORMAL LOW (ref 13.0–17.0)
Immature Granulocytes: 0 %
Lymphocytes Relative: 3 %
Lymphs Abs: 0.3 K/uL — ABNORMAL LOW (ref 0.7–4.0)
MCH: 26.4 pg (ref 26.0–34.0)
MCHC: 32.7 g/dL (ref 30.0–36.0)
MCV: 80.8 fL (ref 80.0–100.0)
Monocytes Absolute: 1.5 K/uL — ABNORMAL HIGH (ref 0.1–1.0)
Monocytes Relative: 16 %
Neutro Abs: 7.5 K/uL (ref 1.7–7.7)
Neutrophils Relative %: 81 %
Platelets: 230 K/uL (ref 150–400)
RBC: 3.33 MIL/uL — ABNORMAL LOW (ref 4.22–5.81)
RDW: 16.4 % — ABNORMAL HIGH (ref 11.5–15.5)
WBC: 9.4 K/uL (ref 4.0–10.5)
nRBC: 0 % (ref 0.0–0.2)

## 2021-06-14 LAB — LACTIC ACID, PLASMA: Lactic Acid, Venous: 1 mmol/L (ref 0.5–1.9)

## 2021-06-14 LAB — APTT: aPTT: 27 s (ref 24–36)

## 2021-06-14 LAB — PROTIME-INR
INR: 1.1 (ref 0.8–1.2)
Prothrombin Time: 14.6 s (ref 11.4–15.2)

## 2021-06-14 MED ORDER — IOHEXOL 350 MG/ML SOLN
80.0000 mL | Freq: Once | INTRAVENOUS | Status: AC | PRN
  Administered 2021-06-14: 22:00:00 80 mL via INTRAVENOUS

## 2021-06-14 MED ORDER — VANCOMYCIN HCL 1500 MG/300ML IV SOLN
1500.0000 mg | INTRAVENOUS | Status: AC
  Administered 2021-06-14: 20:00:00 1500 mg via INTRAVENOUS
  Filled 2021-06-14: qty 300

## 2021-06-14 MED ORDER — LACTATED RINGERS IV BOLUS (SEPSIS)
1000.0000 mL | Freq: Once | INTRAVENOUS | Status: AC
  Administered 2021-06-14: 20:00:00 1000 mL via INTRAVENOUS

## 2021-06-14 MED ORDER — PIPERACILLIN-TAZOBACTAM 3.375 G IVPB 30 MIN
3.3750 g | INTRAVENOUS | Status: AC
  Administered 2021-06-14: 20:00:00 3.375 g via INTRAVENOUS
  Filled 2021-06-14: qty 50

## 2021-06-14 NOTE — Progress Notes (Signed)
When RT changed bandage there was drainage.

## 2021-06-14 NOTE — ED Provider Notes (Signed)
[facility] DEPT Provider Note   CSN: [id number] Arrival date & time: 06/14/21  1830     History Chief Complaint  Patient presents with   Tracheostomy Tube Change    Mitchell Cooper is a 60 y.o. male.  HPI He presents for evaluation of draining and bleeding from his neck wounds.  He had operative intervention for left tonsillar cancer, in October 2022.  He had a radical tonsillectomy.  Subsequently is being maintained at home, with tracheostomy (cannulated) and enteral feedings per nasal tube.  He has began eating and drinking some orally.  He was last evaluated by ENT, on 05/27/2021.  At that time his wounds were healing well.  Patient denies cough, shortness of breath, weakness or dizziness.    Past Medical History:  Diagnosis Date   Cancer [facility])    Left Tonsillar Cancer    DVT (deep venous thrombosis) ([place])    Hypertension 1997   has never taken medications   Tobacco dependence     Patient Active Problem List   Diagnosis Date Noted   Tracheostomy in place [facility]) 05/28/2021   AKI (acute kidney injury) ([place]) 02/18/2021   Cancer-related pain 01/29/2021   Port-A-Cath in place 11/20/2020   Chemotherapy induced neutropenia ([place]) 11/13/2020   Weight loss, unintentional 10/23/2020   Mucositis due to antineoplastic therapy 10/23/2020   Dental caries    Chronic periodontitis    Carcinoma of tonsillar fossa ([place]) 09/12/2020   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   Tonsillar mass 08/08/2020   Mass of left side of neck 07/09/2020   Former smoker 04/08/2020   Primary osteoarthritis of both knees 04/08/2020   VTE (venous thromboembolism) 03/19/2020   RVF (right ventricular failure) ([place]) 12/07/2019   Recurrent pulmonary emboli ([place]) 11/03/2019   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 1984   reconstructive surgery of left knee; arthroscopic procedure about [place] WITH ALVEOLOPLASTY N/A 09/25/2020   Procedure: MULTIPLE EXTRACTIONS WITH ALVEOLOPLASTY;  Surgeon: Charlaine Dalton, DMD;  Location: [facility];  Service: Dentistry;  Laterality: N/A;   PERIPHERAL VASCULAR CATHETERIZATION Left 06/16/2016   Procedure: Lower Extremity Venography- Venus Thrombolysis;  Surgeon: Serafina Mitchell, MD;  Location: [facility] LAB;  Service: Cardiovascular;  Laterality: Left;       Family History  Problem Relation Age of Onset   CAD Mother 29    Social History   Tobacco Use   Smoking status: Some Days    Types: Cigarettes    Last attempt to quit: 11/21/2016    Years since quitting: 4.5   Smokeless tobacco: Never  Vaping Use   Vaping Use: Never used  Substance Use Topics   Alcohol use: Yes    Alcohol/week: 6.0 standard drinks    Types: 6 Cans of beer per week   Drug use: No    Home Medications Prior to Admission medications   Medication Sig Start Date End Date Taking? Authorizing Provider  amLODipine (NORVASC) 10 MG tablet TAKE 1 TABLET (10 MG TOTAL) BY MOUTH DAILY. 04/28/21 04/28/22  Ladell Pier, MD  HYDROcodone-acetaminophen (HYCET) 7.5-325 mg/15  ml solution Take 10 mLs by mouth every 6 (six) hours as needed for moderate pain. 10/30/20   Benay Pike, MD  lidocaine (XYLOCAINE) 2 % solution Patient: Mix 1part 2% viscous lidocaine, 1part H20. Swish & swallow 57mL of diluted mixture, 73min before meals and at bedtime, up to QID 10/13/20   Eppie Gibson, MD  Lidocaine 2 % GEL Apply to painful skin as directed prn. 11/03/20   Eppie Gibson, MD  lidocaine-prilocaine (EMLA) cream Apply to affected area once 10/02/20   Benay Pike, MD  ondansetron  (ZOFRAN) 8 MG tablet Take 1 tablet (8 mg total) by mouth 2 (two) times daily as needed. Start on the third day after cisplatin chemotherapy. 10/02/20   Benay Pike, MD  prochlorperazine (COMPAZINE) 10 MG tablet Take 1 tablet (10 mg total) by mouth every 6 (six) hours as needed (Nausea or vomiting). 10/02/20   Benay Pike, MD  rivaroxaban (XARELTO) 20 MG TABS tablet Take 1 tablet (20 mg total) by mouth daily with supper. 01/12/21   Ladell Pier, MD    Allergies    Patient has no known allergies.  Review of Systems   Review of Systems  Physical Exam Updated Vital Signs BP 106/65 (BP Location: Left Arm)   Pulse 86   Temp 99.6 F (37.6 C) (Oral)   Resp (!) 23   Ht 5\' 9"  (1.753 m)   Wt 66.5 kg   SpO2 93%   BMI 21.65 kg/m   Physical Exam Vitals and nursing note reviewed.  Constitutional:      Appearance: He is well-developed. He is not ill-appearing.  HENT:     Head: Normocephalic and atraumatic.     Right Ear: External ear normal.     Left Ear: External ear normal.     Nose: No congestion or rhinorrhea.     Mouth/Throat:     Pharynx: No oropharyngeal exudate.  Eyes:     Conjunctiva/sclera: Conjunctivae normal.     Pupils: Pupils are equal, round, and reactive to light.  Neck:     Trachea: Phonation normal.     Comments: There is fullness beneath the mandibular area, with indurated skin.  There is an area of skin breakdown centrally in the indurated area, which is releasing a small amount of blood and there is sanguinous fluid.  The area is not tender and is not fluctuant to palpation.  The tracheostomy site with trach cannula below that appears patent and in good repair.  There is no bleeding from the tracheostomy site. Cardiovascular:     Rate and Rhythm: Normal rate and regular rhythm.     Heart sounds: Normal heart sounds.  Pulmonary:     Effort: Pulmonary effort is normal.     Breath sounds: Normal breath sounds.  Abdominal:     Palpations: Abdomen is soft.      Tenderness: There is no abdominal tenderness.  Musculoskeletal:        General: Normal range of motion.     Cervical back: Normal range of motion.  Skin:    General: Skin is warm and dry.  Neurological:     Mental Status: He is alert and oriented to person, place, and time.     Cranial Nerves: No cranial nerve deficit.     Sensory: No sensory deficit.     Motor: No abnormal muscle tone.     Coordination: Coordination normal.  Psychiatric:        Mood and Affect: Mood normal.  Behavior: Behavior normal.        Thought Content: Thought content normal.        Judgment: Judgment normal.    ED Results / Procedures / Treatments   Labs (all labs ordered are listed, but only abnormal results are displayed) Labs Reviewed  COMPREHENSIVE METABOLIC PANEL - Abnormal; Notable for the following components:      Result Value   Glucose, Bld 102 (*)    BUN 35 (*)    Creatinine, Ser 1.34 (*)    Albumin 2.9 (*)    All other components within normal limits  CBC WITH DIFFERENTIAL/PLATELET - Abnormal; Notable for the following components:   RBC 3.33 (*)    Hemoglobin 8.8 (*)    HCT 26.9 (*)    RDW 16.4 (*)    Lymphs Abs 0.3 (*)    Monocytes Absolute 1.5 (*)    All other components within normal limits  CULTURE, BLOOD (ROUTINE X 2)  CULTURE, BLOOD (ROUTINE X 2)  LACTIC ACID, PLASMA  PROTIME-INR  APTT  URINALYSIS, ROUTINE W REFLEX MICROSCOPIC    EKG None   Date: 06/14/21  Rate: 80  Rhythm: normal sinus rhythm  QRS Axis: normal  PR and QT Intervals: normal  ST/T Wave abnormalities: normal  PR and QRS Conduction Disutrbances:none  Narrative Interpretation:   Old EKG Reviewed: none available    Radiology CT Soft Tissue Neck W Contrast  Result Date: 06/14/2021 CLINICAL DATA:  Bloody drainage from tracheostomy tube, which was placed on 10/17. The the EXAM: CT NECK WITH CONTRAST TECHNIQUE: Multidetector CT imaging of the neck was performed using the standard protocol  following the bolus administration of intravenous contrast. CONTRAST:  54mL OMNIPAQUE IOHEXOL 350 MG/ML SOLN COMPARISON:  03/16/2021. FINDINGS: Pharynx and larynx: Low-density soft tissue without significant enhancement in the left palatine tonsil, measuring up to 4.2 x 1.8 x 3.9 cm (series 2, image 34 and series 6, image 69), with blood vessels are noted traversing the hypodensity, which is favored to represent surgical fat graft. Edema in the hypopharynx and upper larynx. No focal collection. Tracheostomy tube noted in the trachea. No focal collection in the area of the tracheostomy tube. Salivary glands: Suspect surgical removal of the left submandibular gland. The remainder of the salivary glands are unremarkable. Thyroid: Normal. Lymph nodes: Status post bilateral neck dissection; remaining lymph nodes are not enlarged or abnormal density. Vascular: Negative. Limited intracranial: Negative. Visualized orbits: Negative. Mastoids and visualized paranasal sinuses: Mucosal thickening and mucous retention cysts in the maxillary sinuses. Nasogastric tube. Fluid in the left-greater-than-right mastoid air cells. Skeleton: Degenerative changes with reversal of the normal cervical lordosis. No acute osseous abnormality. Upper chest: No focal pulmonary opacity or pleural effusion. Right chest port with catheter tip extending off the inferior field of view in the SVC. Other: In the left submandibular soft tissues, there is a low-density collection with an air-fluid level, which measures up to 3.9 x 1.1 x 2.7 cm (series 6, image 62 and series 2, image 56), which tracks inferiorly from the mandible into the left neck. This may be postsurgical or represent abscess with gas-forming organisms. IMPRESSION: 1. Low-density collection with air-fluid level in the left submandibular soft tissues, which may be postsurgical but is concerning for abscess with gas-forming organisms. 2. Status post left tonsillectomy and pharyngectomy  with fat graft material in the left pharynx. 3. Tracheostomy tube in place, without evidence of focal collection in the area. 4. No residual lymphadenopathy, status post bilateral neck dissection. Electronically  Signed   By: Merilyn Baba M.D.   On: 06/14/2021 23:05   DG Chest Port 1 View  Result Date: 06/14/2021 CLINICAL DATA:  Bloody drainage from tracheostomy tube. EXAM: PORTABLE CHEST 1 VIEW COMPARISON:  May 30, 2020 FINDINGS: A tracheostomy tube is in place with its distal tip seen just beyond the level of the clavicles. A right-sided venous Port-A-Cath is noted with its distal tip approximately 7 mm proximal to the junction of the superior vena cava and right atrium. A feeding tube is noted with its radiopaque weighted tip overlying the body of the stomach. The heart size and mediastinal contours are within normal limits. Both lungs are clear. The visualized skeletal structures are unremarkable. IMPRESSION: No active disease. Electronically Signed   By: Virgina Norfolk M.D.   On: 06/14/2021 19:40    Procedures .Critical Care Performed by: Daleen Bo, MD Authorized by: Daleen Bo, MD   Critical care provider statement:    Critical care time (minutes):  90   Critical care start time:  06/14/2021 7:25 PM   Critical care end time:  06/15/2021 1:10 AM   Critical care time was exclusive of:  Separately billable procedures and treating other patients   Critical care was time spent personally by me on the following activities:  Blood draw for specimens, development of treatment plan with patient or surrogate, discussions with consultants, evaluation of patient's response to treatment, examination of patient, ordering and performing treatments and interventions, ordering and review of laboratory studies, ordering and review of radiographic studies, pulse oximetry, re-evaluation of patient's condition and review of old charts   Medications Ordered in ED Medications  lactated ringers  bolus 1,000 mL (0 mLs Intravenous Stopped 06/14/21 2015)  vancomycin (VANCOREADY) IVPB 1500 mg/300 mL (1,500 mg Intravenous New Bag/Given 06/14/21 2028)  piperacillin-tazobactam (ZOSYN) IVPB 3.375 g (0 g Intravenous Stopped 06/14/21 2057)  iohexol (OMNIPAQUE) 350 MG/ML injection 80 mL (80 mLs Intravenous Contrast Given 06/14/21 2157)    ED Course  I have reviewed the triage vital signs and the nursing notes.  Pertinent labs & imaging results that were available during my care of the patient were reviewed by me and considered in my medical decision making (see chart for details).  Clinical Course as of 06/15/21 1120  Mon Jun 15, 2021  1006 CBC with Differential(!!) Repeat labs reveal upward trending LA (1.0 to 2.1). Decrease in HGB likely dilutional.  [PM]    Clinical Course User Index [PM] Valarie Merino, MD   MDM Rules/Calculators/A&P                            Patient Vitals for the past 24 hrs:  BP Temp Temp src Pulse Resp SpO2 Height Weight  06/14/21 2347 -- 99.6 F (37.6 C) Oral -- -- -- -- --  06/14/21 2302 106/65 -- -- 86 (!) 23 93 % -- --  06/14/21 2224 101/76 -- -- 79 17 100 % -- --  06/14/21 2130 114/73 -- -- 91 (!) 23 97 % -- --  06/14/21 2041 100/68 -- -- 75 13 100 % -- --  06/14/21 2015 -- -- -- 83 16 100 % -- --  06/14/21 2002 (!) 115/52 -- -- 80 15 99 % -- --  06/14/21 2000 -- -- -- (!) 39 17 98 % -- --  06/14/21 1945 -- -- -- 89 -- 100 % -- --  06/14/21 1930 -- -- -- 84 -- 100 % -- --  06/14/21 1919 99/66 -- -- 85 16 100 % -- --  06/14/21 1915 -- -- -- -- -- -- 5\' 9"  (1.753 m) 66.5 kg  06/14/21 1915 99/66 -- -- 84 -- 100 % -- --  06/14/21 1900 99/66 -- -- 94 -- 100 % -- --  06/14/21 1845 109/71 -- -- 86 -- 99 % -- --  06/14/21 1842 106/70 98.1 F (36.7 C) Oral 95 18 100 % -- --    11:56 PM Reevaluation with update and discussion. After initial assessment and treatment, an updated evaluation reveals patient is currently having a chill.  He does not have  a fever at this time.  He is agreeable to discharge if that is the proper thing to do.  I am planning on contacting his ENT doctor at this time. Daleen Bo   Medical Decision Making:  This patient is presenting for evaluation of draining and bleeding from neck wound.  He had a recent radical tonsillectomy, which does require a range of treatment options, and is a complaint that involves a high risk of morbidity and mortality. The differential diagnoses include cellulitis, abscess, complications from surgery, complications from tonsillar cancer. I decided to review old records, and in summary presenting from home for evaluation of complication from suspected wound infection.  I obtained additional historical information from family member at bedside.  Clinical Laboratory Tests Ordered, included CBC, Metabolic panel, and lactate, , blood cultures, viral panel . Review indicates normal except hemoglobin low, BUN high, creatinine high, calcium low. Radiologic Tests Ordered, included chest x-ray, CT soft tissue neck.  I independently Visualized: Radiographic images, which show chest x-ray no acute abnormalities.  CT scan of the neck shows possible gas-forming organisms versus stable postoperative changes  Cardiac Monitor Tracing which shows sinus tachycardia    Critical Interventions-clinical evaluation laboratory testing, CT imaging, empiric antibiotics for possible postoperative wound infection.  Screening for sepsis.  IV fluid boluses.  Observation and reassessment.  Initial lactate normal.  Initially no fever.  After These Interventions, the Patient was reevaluated and was found with relatively stable condition and possible postoperative wound infection indicated by CT imaging.  White count not elevated.  Patient at risk for decompensation due to recent radical tonsillectomy and presenting to ED with swelling, drainage and bleeding from his postoperative wound.  Call placed to his ENT doctor at  [place] transitioned to Dr. Dayna Barker to discuss this with ENT, regarding management and possible transfer.  CRITICAL CARE-yes Performed by: Daleen Bo  Nursing Notes Reviewed/ Care Coordinated Applicable Imaging Reviewed Interpretation of Laboratory Data incorporated into ED treatment  Plan consultation with ENT regarding disposition and possible transfer.    Final Clinical Impression(s) / ED Diagnoses Final diagnoses:  Sepsis, due to unspecified organism, unspecified whether acute organ dysfunction present [facility])    Rx / DC Orders ED Discharge Orders     None        Daleen Bo, MD 06/15/21 1131

## 2021-06-14 NOTE — Progress Notes (Signed)
RT went to see patient and did dressing change, changed trach ties and trach care.Patient tolerated well.

## 2021-06-14 NOTE — Progress Notes (Signed)
A consult was received from an ED physician for Vancomycin and Zosyn per pharmacy dosing.  The patient's profile has been reviewed for ht/wt/allergies/indication/available labs.   A one time order has been placed for Vancomycin 1500mg  IV and Zosyn 3.375g IV.  Further antibiotics/pharmacy consults should be ordered by admitting physician if indicated.                       Thank you, Luiz Ochoa 06/14/2021  7:34 PM

## 2021-06-14 NOTE — ED Triage Notes (Signed)
PT c/o bloody drainage from trach tube. States it was placed on 10/17. Reports he has been on abx for same from oncology recently.

## 2021-06-15 DIAGNOSIS — Z8616 Personal history of COVID-19: Secondary | ICD-10-CM | POA: Diagnosis not present

## 2021-06-15 DIAGNOSIS — M272 Inflammatory conditions of jaws: Secondary | ICD-10-CM | POA: Diagnosis not present

## 2021-06-15 DIAGNOSIS — Z6822 Body mass index (BMI) 22.0-22.9, adult: Secondary | ICD-10-CM | POA: Diagnosis not present

## 2021-06-15 DIAGNOSIS — Z978 Presence of other specified devices: Secondary | ICD-10-CM | POA: Diagnosis not present

## 2021-06-15 DIAGNOSIS — Z79899 Other long term (current) drug therapy: Secondary | ICD-10-CM | POA: Diagnosis not present

## 2021-06-15 DIAGNOSIS — Z86711 Personal history of pulmonary embolism: Secondary | ICD-10-CM | POA: Diagnosis not present

## 2021-06-15 DIAGNOSIS — D62 Acute posthemorrhagic anemia: Secondary | ICD-10-CM | POA: Diagnosis not present

## 2021-06-15 DIAGNOSIS — K122 Cellulitis and abscess of mouth: Secondary | ICD-10-CM | POA: Diagnosis not present

## 2021-06-15 DIAGNOSIS — R000 Tachycardia, unspecified: Secondary | ICD-10-CM | POA: Diagnosis not present

## 2021-06-15 DIAGNOSIS — Z7901 Long term (current) use of anticoagulants: Secondary | ICD-10-CM | POA: Diagnosis not present

## 2021-06-15 DIAGNOSIS — I129 Hypertensive chronic kidney disease with stage 1 through stage 4 chronic kidney disease, or unspecified chronic kidney disease: Secondary | ICD-10-CM | POA: Diagnosis not present

## 2021-06-15 DIAGNOSIS — Z87891 Personal history of nicotine dependence: Secondary | ICD-10-CM | POA: Diagnosis not present

## 2021-06-15 DIAGNOSIS — Z930 Tracheostomy status: Secondary | ICD-10-CM | POA: Diagnosis not present

## 2021-06-15 DIAGNOSIS — R6521 Severe sepsis with septic shock: Secondary | ICD-10-CM | POA: Diagnosis not present

## 2021-06-15 DIAGNOSIS — Z8529 Personal history of malignant neoplasm of other respiratory and intrathoracic organs: Secondary | ICD-10-CM | POA: Diagnosis not present

## 2021-06-15 DIAGNOSIS — F17210 Nicotine dependence, cigarettes, uncomplicated: Secondary | ICD-10-CM | POA: Diagnosis not present

## 2021-06-15 DIAGNOSIS — I10 Essential (primary) hypertension: Secondary | ICD-10-CM | POA: Diagnosis not present

## 2021-06-15 DIAGNOSIS — E43 Unspecified severe protein-calorie malnutrition: Secondary | ICD-10-CM | POA: Diagnosis not present

## 2021-06-15 DIAGNOSIS — C7989 Secondary malignant neoplasm of other specified sites: Secondary | ICD-10-CM | POA: Diagnosis not present

## 2021-06-15 DIAGNOSIS — T8141XA Infection following a procedure, superficial incisional surgical site, initial encounter: Secondary | ICD-10-CM | POA: Diagnosis not present

## 2021-06-15 DIAGNOSIS — M170 Bilateral primary osteoarthritis of knee: Secondary | ICD-10-CM | POA: Diagnosis not present

## 2021-06-15 DIAGNOSIS — N183 Chronic kidney disease, stage 3 unspecified: Secondary | ICD-10-CM | POA: Diagnosis not present

## 2021-06-15 DIAGNOSIS — T8144XA Sepsis following a procedure, initial encounter: Secondary | ICD-10-CM | POA: Diagnosis not present

## 2021-06-15 DIAGNOSIS — H9190 Unspecified hearing loss, unspecified ear: Secondary | ICD-10-CM | POA: Diagnosis not present

## 2021-06-15 DIAGNOSIS — C099 Malignant neoplasm of tonsil, unspecified: Secondary | ICD-10-CM | POA: Diagnosis not present

## 2021-06-15 DIAGNOSIS — A419 Sepsis, unspecified organism: Secondary | ICD-10-CM | POA: Diagnosis present

## 2021-06-15 DIAGNOSIS — Z20822 Contact with and (suspected) exposure to covid-19: Secondary | ICD-10-CM | POA: Diagnosis not present

## 2021-06-15 LAB — BASIC METABOLIC PANEL
Anion gap: 7 (ref 5–15)
BUN: 24 mg/dL — ABNORMAL HIGH (ref 6–20)
CO2: 26 mmol/L (ref 22–32)
Calcium: 8.3 mg/dL — ABNORMAL LOW (ref 8.9–10.3)
Chloride: 103 mmol/L (ref 98–111)
Creatinine, Ser: 1.31 mg/dL — ABNORMAL HIGH (ref 0.61–1.24)
GFR, Estimated: >60 mL/min (ref >60)
Glucose, Bld: 104 mg/dL — ABNORMAL HIGH (ref 70–99)
Potassium: 3.7 mmol/L (ref 3.5–5.1)
Sodium: 136 mmol/L (ref 135–145)

## 2021-06-15 LAB — RESP PANEL BY RT-PCR (FLU A&B, COVID) ARPGX2
Influenza A by PCR: NEGATIVE
Influenza B by PCR: NEGATIVE
SARS Coronavirus 2 by RT PCR: NEGATIVE

## 2021-06-15 LAB — URINALYSIS, ROUTINE W REFLEX MICROSCOPIC
Bacteria, UA: NONE SEEN
Bilirubin Urine: NEGATIVE
Glucose, UA: NEGATIVE mg/dL
Ketones, ur: NEGATIVE mg/dL
Leukocytes,Ua: NEGATIVE
Nitrite: NEGATIVE
Protein, ur: NEGATIVE mg/dL
Specific Gravity, Urine: 1.023 (ref 1.005–1.030)
pH: 6 (ref 5.0–8.0)

## 2021-06-15 LAB — CBC WITH DIFFERENTIAL/PLATELET
Abs Immature Granulocytes: 0.08 10*3/uL — ABNORMAL HIGH (ref 0.00–0.07)
Basophils Absolute: 0 10*3/uL (ref 0.0–0.1)
Basophils Relative: 0 %
Eosinophils Absolute: 0 10*3/uL (ref 0.0–0.5)
Eosinophils Relative: 0 %
HCT: 20.5 % — ABNORMAL LOW (ref 39.0–52.0)
Hemoglobin: 6.8 g/dL — CL (ref 13.0–17.0)
Immature Granulocytes: 1 %
Lymphocytes Relative: 1 %
Lymphs Abs: 0.1 10*3/uL — ABNORMAL LOW (ref 0.7–4.0)
MCH: 26.9 pg (ref 26.0–34.0)
MCHC: 33.2 g/dL (ref 30.0–36.0)
MCV: 81 fL (ref 80.0–100.0)
Monocytes Absolute: 0.2 10*3/uL (ref 0.1–1.0)
Monocytes Relative: 2 %
Neutro Abs: 11 10*3/uL — ABNORMAL HIGH (ref 1.7–7.7)
Neutrophils Relative %: 96 %
Platelets: 152 10*3/uL (ref 150–400)
RBC: 2.53 MIL/uL — ABNORMAL LOW (ref 4.22–5.81)
RDW: 16.8 % — ABNORMAL HIGH (ref 11.5–15.5)
WBC: 11.4 10*3/uL — ABNORMAL HIGH (ref 4.0–10.5)
nRBC: 0 % (ref 0.0–0.2)

## 2021-06-15 LAB — LACTIC ACID, PLASMA: Lactic Acid, Venous: 2.1 mmol/L (ref 0.5–1.9)

## 2021-06-15 MED ORDER — VANCOMYCIN HCL 1250 MG/250ML IV SOLN: 1250.0000 mg | INTRAVENOUS | Status: DC

## 2021-06-15 MED ORDER — ACETAMINOPHEN 500 MG PO TABS
1000.0000 mg | ORAL_TABLET | Freq: Once | ORAL | Status: DC
  Filled 2021-06-15: qty 2

## 2021-06-15 MED ORDER — SODIUM CHLORIDE 0.9 % IV SOLN
2.0000 g | Freq: Two times a day (BID) | INTRAVENOUS | Status: DC
  Administered 2021-06-15: 06:00:00 2 g via INTRAVENOUS
  Filled 2021-06-15: qty 2

## 2021-06-15 MED ORDER — SODIUM CHLORIDE 0.9 % IV BOLUS
500.0000 mL | Freq: Once | INTRAVENOUS | Status: AC
  Administered 2021-06-15: 09:00:00 500 mL via INTRAVENOUS

## 2021-06-15 MED ORDER — METRONIDAZOLE 500 MG/100ML IV SOLN
500.0000 mg | Freq: Two times a day (BID) | INTRAVENOUS | Status: DC
  Administered 2021-06-15: 06:00:00 500 mg via INTRAVENOUS
  Filled 2021-06-15: qty 100

## 2021-06-15 MED ORDER — ACETAMINOPHEN 650 MG RE SUPP
650.0000 mg | Freq: Once | RECTAL | Status: AC
  Administered 2021-06-15: 03:00:00 650 mg via RECTAL
  Filled 2021-06-15: qty 1

## 2021-06-15 MED ORDER — LACTATED RINGERS IV BOLUS (SEPSIS)
1000.0000 mL | Freq: Once | INTRAVENOUS | Status: AC
  Administered 2021-06-15: 04:00:00 1000 mL via INTRAVENOUS

## 2021-06-15 MED ORDER — METRONIDAZOLE 500 MG/100ML IV SOLN: 500.0000 mg | Freq: Once | INTRAVENOUS | Status: DC

## 2021-06-15 MED ORDER — NOREPINEPHRINE 4 MG/250ML-% IV SOLN
0.0000 ug/min | INTRAVENOUS | Status: DC
  Administered 2021-06-15: 09:00:00 2 ug/min via INTRAVENOUS
  Filled 2021-06-15: qty 250

## 2021-06-15 MED ORDER — LACTATED RINGERS IV SOLN: INTRAVENOUS | Status: DC

## 2021-06-15 MED ORDER — LACTATED RINGERS IV BOLUS
1000.0000 mL | Freq: Once | INTRAVENOUS | Status: AC
  Administered 2021-06-15: 01:00:00 1000 mL via INTRAVENOUS

## 2021-06-15 MED ORDER — LACTATED RINGERS IV BOLUS
1000.0000 mL | Freq: Once | INTRAVENOUS | Status: AC
  Administered 2021-06-15: 04:00:00 1000 mL via INTRAVENOUS

## 2021-06-15 MED ORDER — LACTATED RINGERS IV BOLUS (SEPSIS)
250.0000 mL | Freq: Once | INTRAVENOUS | Status: AC
  Administered 2021-06-15: 06:00:00 250 mL via INTRAVENOUS

## 2021-06-15 NOTE — Sepsis Progress Note (Addendum)
[facility] following for Sepsis Protocol was called at 0339, Looking back blood cultures done yesterday at [facility], Lactic done yesterday [id number], Abx's and Fluids have been given as well. Pt is hypotensive, will continue to monitor

## 2021-06-15 NOTE — ED Provider Notes (Addendum)
Patient seen after prior EDP.  Antibiotics -vancomycin and Zosyn - already administered.  Patient transfer already discussed with [facility] health.  Patient has developed hypotension during ED evaluation. Patient with suspected neck infection near site of recent tracheostomy.  Patient with hypotension that is not resolving with fluids alone. Will start Levophed drip through peripheral line.  Lactic acid is trending upward despite aggressive fluid resuscitation (1.0 to 2.1).  Patient is asymptomatic and comfortable during my evaluation time.  Case again discussed with [facility]. Dr. Jaclyn Shaggy (ENT) is aware of the case.  She requests that patient be sent emergently as an ED to ED transfer.    Dr. Prudence Davidson of the [facility] ED accepts patient in transfer.  1030 Patient reevaluated as he is being loaded onto stretcher for [facility] transfer.  He is comfortable.  He understands plan of care. Levophed gtt infusing.   Valarie Merino, MD 06/15/21 0945    Valarie Merino, MD 06/15/21 1030

## 2021-06-15 NOTE — ED Notes (Signed)
[facility] called for transport to [facility] ED.

## 2021-06-15 NOTE — ED Notes (Signed)
Critical Hemoglobin 6.8, reported to [facility]

## 2021-06-15 NOTE — Progress Notes (Signed)
Pharmacy Antibiotic Note  Mitchell Cooper is a 60 y.o. male admitted on 06/14/2021 with sepsis with deep neck space infection.  PMH significant for tonsillar cancer and s/p tonsillectomy Oct 2022.  Patient currently with tracheostomy.  Pharmacy has been consulted for Vancomycin and Cefepime dosing.  In the ED, patient has received Vancomycin 1500mg  IV and Zosyn 3.375gm IV x 1 dose each.  Plan: Cefepime 2gm IV q12h Vancomycin 1250 mg IV Q 24 hrs. Goal AUC 400-550.  Expected AUC: 514.5  SCr used: 1.34 Metronidazole per MD Plans to transfer to [place]  Height: 5\' 9"  (175.3 cm) Weight: 66.5 kg (146 lb 9.7 oz) IBW/kg (Calculated) : 70.7  Temp (24hrs), Avg:99.9 F (37.7 C), Min:98.1 F (36.7 C), Max:101.8 F (38.8 C)  Recent Labs  Lab 06/14/21 1955  WBC 9.4  CREATININE 1.34*  LATICACIDVEN 1.0    Estimated Creatinine Clearance: 55.8 mL/min (A) (by C-G formula based on SCr of 1.34 mg/dL (H)).    No Known Allergies  Antimicrobials this admission: 11/27 Zosyn x 1 11/28 Vancomycin >>   11/28 Cefepime >> 11/28 Metronidazole >>  Dose adjustments this admission:    Microbiology results: 11/27 BCx:     Thank you for allowing pharmacy to be a part of this patient's care.  Everette Rank, PharmD 06/15/2021 5:40 AM

## 2021-06-15 NOTE — ED Notes (Signed)
Report called to Ollen Barges at [facility] ED.

## 2021-06-15 NOTE — ED Provider Notes (Signed)
12:18 AM Assumed care from Dr. Eulis Foster, please see their note for full history, physical and decision making until this point. In brief this is a 60 y.o. year old male who presented to the ED tonight with Tracheostomy Tube Change     At time of checkout patient stable with normal blood pressure slightly tachycardic.  Plan was to wait for consultation with ENT with likely discharge to follow-up with them on Thursday with Augmentin.  Discussed with Dr. Rachel Moulds who felt that with stable vital signs no white count and patient feeling well that he was stable for discharge with Augmentin and a dressing change.  Around the time of discharge patient stated he was not feeling very well.  Vital signs were rechecked and he was now tachycardic in the 130s.  He was also found to be febrile.  Patient was given Tylenol and some fluids.  He did already had antibiotics and blood cultures drawn.  He was observed and he states still did not feel well and his blood pressure started to trend down.  Patient was started on some fluids and seem to be fluid responsive.  I discussed with Dr. Rachel Moulds at [facility] again that I did not think the patient was stable for discharge at this time.  She discussed with her attending and they want to do an ED to ED transfer so they can evaluate them but they wanted [place] to continue a infectious work-up in the meantime.  PAL line feels like the patient should get a bed sometime today and they can be ED ED transferred for ENT evaluation and admission.  They will keep [place] updated via the doc phone.  Blood pressures have improved.  Bedside checkout handoff given to Dr. Francia Greaves.  Patient states he feels well at this time.  Has some fluctuating pressures based on the fluid status.  Making good urine.  He does appear to be perfusing pretty well.   CRITICAL CARE Performed by: Merrily Pew Total critical care time: 35 minutes Critical care time was exclusive of separately billable procedures  and treating other patients. Critical care was necessary to treat or prevent imminent or life-threatening deterioration. Critical care was time spent personally by me on the following activities: development of treatment plan with patient and/or surrogate as well as nursing, discussions with consultants, evaluation of patient's response to treatment, examination of patient, obtaining history from patient or surrogate, ordering and performing treatments and interventions, ordering and review of laboratory studies, ordering and review of radiographic studies, pulse oximetry and re-evaluation of patient's condition.   Labs, studies and imaging reviewed by myself and considered in medical decision making if ordered. Imaging interpreted by radiology.  Labs Reviewed  COMPREHENSIVE METABOLIC PANEL - Abnormal; Notable for the following components:      Result Value   Glucose, Bld 102 (*)    BUN 35 (*)    Creatinine, Ser 1.34 (*)    Albumin 2.9 (*)    All other components within normal limits  CBC WITH DIFFERENTIAL/PLATELET - Abnormal; Notable for the following components:   RBC 3.33 (*)    Hemoglobin 8.8 (*)    HCT 26.9 (*)    RDW 16.4 (*)    Lymphs Abs 0.3 (*)    Monocytes Absolute 1.5 (*)    All other components within normal limits  CULTURE, BLOOD (ROUTINE X 2)  CULTURE, BLOOD (ROUTINE X 2)  LACTIC ACID, PLASMA  PROTIME-INR  APTT  URINALYSIS, ROUTINE W REFLEX MICROSCOPIC  CT Soft Tissue Neck W Contrast  Final Result    DG Chest Port 1 View  Final Result      No follow-ups on file.    Million Maharaj, Corene Cornea, MD 06/15/21 [id number]

## 2021-06-16 ENCOUNTER — Other Ambulatory Visit (HOSPITAL_COMMUNITY): Payer: Self-pay

## 2021-06-16 DIAGNOSIS — Z4659 Encounter for fitting and adjustment of other gastrointestinal appliance and device: Secondary | ICD-10-CM | POA: Diagnosis not present

## 2021-06-16 LAB — BLOOD CULTURE ID PANEL (REFLEXED) - BCID2

## 2021-06-17 ENCOUNTER — Telehealth: Payer: Self-pay | Admitting: Pharmacist

## 2021-06-17 DIAGNOSIS — Z930 Tracheostomy status: Secondary | ICD-10-CM | POA: Diagnosis not present

## 2021-06-17 DIAGNOSIS — I10 Essential (primary) hypertension: Secondary | ICD-10-CM | POA: Diagnosis not present

## 2021-06-17 DIAGNOSIS — C099 Malignant neoplasm of tonsil, unspecified: Secondary | ICD-10-CM | POA: Diagnosis not present

## 2021-06-17 DIAGNOSIS — M272 Inflammatory conditions of jaws: Secondary | ICD-10-CM | POA: Diagnosis not present

## 2021-06-17 DIAGNOSIS — Z978 Presence of other specified devices: Secondary | ICD-10-CM | POA: Diagnosis not present

## 2021-06-17 DIAGNOSIS — E43 Unspecified severe protein-calorie malnutrition: Secondary | ICD-10-CM | POA: Diagnosis not present

## 2021-06-17 DIAGNOSIS — C7989 Secondary malignant neoplasm of other specified sites: Secondary | ICD-10-CM | POA: Diagnosis not present

## 2021-06-17 DIAGNOSIS — R6521 Severe sepsis with septic shock: Secondary | ICD-10-CM | POA: Diagnosis not present

## 2021-06-17 DIAGNOSIS — Z6822 Body mass index (BMI) 22.0-22.9, adult: Secondary | ICD-10-CM | POA: Diagnosis not present

## 2021-06-17 LAB — CULTURE, BLOOD (ROUTINE X 2): Special Requests: ADEQUATE

## 2021-06-17 NOTE — Progress Notes (Signed)
Pharmacy - Antimicrobial Stewardship  Single set blood culture drawn at [facility] prior to transfer to [place] center   11/27 blood culture (single set):  aerobic bottle only GPC pairs in chains.  BCID detected Streptococcus species,  isolate identified as Streptococcus anginosis.  susceptibilities are pending.  Called ICU and spoke with Gwyndolyn Saxon, RN who will communicate blood culture with team. Also stated result should also be found in [place] in [place], PharmD, [place], [place] Work Cell: [phone] 06/17/2021 10:32 AM

## 2021-06-18 DIAGNOSIS — C7989 Secondary malignant neoplasm of other specified sites: Secondary | ICD-10-CM | POA: Diagnosis not present

## 2021-06-18 DIAGNOSIS — C099 Malignant neoplasm of tonsil, unspecified: Secondary | ICD-10-CM | POA: Diagnosis not present

## 2021-06-18 DIAGNOSIS — M272 Inflammatory conditions of jaws: Secondary | ICD-10-CM | POA: Diagnosis not present

## 2021-06-18 DIAGNOSIS — I10 Essential (primary) hypertension: Secondary | ICD-10-CM | POA: Diagnosis not present

## 2021-06-18 DIAGNOSIS — Z930 Tracheostomy status: Secondary | ICD-10-CM | POA: Diagnosis not present

## 2021-06-18 DIAGNOSIS — Z6822 Body mass index (BMI) 22.0-22.9, adult: Secondary | ICD-10-CM | POA: Diagnosis not present

## 2021-06-18 DIAGNOSIS — E43 Unspecified severe protein-calorie malnutrition: Secondary | ICD-10-CM | POA: Diagnosis not present

## 2021-06-18 DIAGNOSIS — Z978 Presence of other specified devices: Secondary | ICD-10-CM | POA: Diagnosis not present

## 2021-06-18 DIAGNOSIS — R6521 Severe sepsis with septic shock: Secondary | ICD-10-CM | POA: Diagnosis not present

## 2021-06-19 DIAGNOSIS — A419 Sepsis, unspecified organism: Secondary | ICD-10-CM | POA: Diagnosis not present

## 2021-06-19 DIAGNOSIS — I10 Essential (primary) hypertension: Secondary | ICD-10-CM | POA: Diagnosis not present

## 2021-06-19 DIAGNOSIS — C099 Malignant neoplasm of tonsil, unspecified: Secondary | ICD-10-CM | POA: Diagnosis not present

## 2021-06-19 DIAGNOSIS — R6521 Severe sepsis with septic shock: Secondary | ICD-10-CM | POA: Diagnosis not present

## 2021-06-19 DIAGNOSIS — A4902 Methicillin resistant Staphylococcus aureus infection, unspecified site: Secondary | ICD-10-CM | POA: Diagnosis not present

## 2021-06-19 DIAGNOSIS — E43 Unspecified severe protein-calorie malnutrition: Secondary | ICD-10-CM | POA: Diagnosis not present

## 2021-06-19 DIAGNOSIS — Z930 Tracheostomy status: Secondary | ICD-10-CM | POA: Diagnosis not present

## 2021-06-19 DIAGNOSIS — Z6822 Body mass index (BMI) 22.0-22.9, adult: Secondary | ICD-10-CM | POA: Diagnosis not present

## 2021-06-19 DIAGNOSIS — M272 Inflammatory conditions of jaws: Secondary | ICD-10-CM | POA: Diagnosis not present

## 2021-06-19 DIAGNOSIS — Z7901 Long term (current) use of anticoagulants: Secondary | ICD-10-CM | POA: Diagnosis not present

## 2021-06-19 DIAGNOSIS — Z86718 Personal history of other venous thrombosis and embolism: Secondary | ICD-10-CM | POA: Diagnosis not present

## 2021-06-20 DIAGNOSIS — Z930 Tracheostomy status: Secondary | ICD-10-CM | POA: Diagnosis not present

## 2021-06-20 DIAGNOSIS — Z86718 Personal history of other venous thrombosis and embolism: Secondary | ICD-10-CM | POA: Diagnosis not present

## 2021-06-20 DIAGNOSIS — Z7901 Long term (current) use of anticoagulants: Secondary | ICD-10-CM | POA: Diagnosis not present

## 2021-06-20 DIAGNOSIS — A419 Sepsis, unspecified organism: Secondary | ICD-10-CM | POA: Diagnosis not present

## 2021-06-20 DIAGNOSIS — I10 Essential (primary) hypertension: Secondary | ICD-10-CM | POA: Diagnosis not present

## 2021-06-20 DIAGNOSIS — Z8616 Personal history of COVID-19: Secondary | ICD-10-CM | POA: Diagnosis not present

## 2021-06-20 DIAGNOSIS — R6521 Severe sepsis with septic shock: Secondary | ICD-10-CM | POA: Diagnosis not present

## 2021-06-20 DIAGNOSIS — C099 Malignant neoplasm of tonsil, unspecified: Secondary | ICD-10-CM | POA: Diagnosis not present

## 2021-06-20 DIAGNOSIS — M272 Inflammatory conditions of jaws: Secondary | ICD-10-CM | POA: Diagnosis not present

## 2021-06-22 ENCOUNTER — Telehealth: Payer: Self-pay | Admitting: Hematology and Oncology

## 2021-06-22 DIAGNOSIS — S1190XA Unspecified open wound of unspecified part of neck, initial encounter: Secondary | ICD-10-CM | POA: Diagnosis not present

## 2021-06-22 NOTE — Telephone Encounter (Signed)
Rescheduled appointment per provider. Patient is aware. 

## 2021-06-24 DIAGNOSIS — C7989 Secondary malignant neoplasm of other specified sites: Secondary | ICD-10-CM | POA: Diagnosis not present

## 2021-06-24 DIAGNOSIS — K122 Cellulitis and abscess of mouth: Secondary | ICD-10-CM | POA: Diagnosis not present

## 2021-06-24 DIAGNOSIS — Z87891 Personal history of nicotine dependence: Secondary | ICD-10-CM | POA: Diagnosis not present

## 2021-06-24 DIAGNOSIS — A419 Sepsis, unspecified organism: Secondary | ICD-10-CM | POA: Diagnosis not present

## 2021-06-24 DIAGNOSIS — M179 Osteoarthritis of knee, unspecified: Secondary | ICD-10-CM | POA: Diagnosis not present

## 2021-06-24 DIAGNOSIS — H9190 Unspecified hearing loss, unspecified ear: Secondary | ICD-10-CM | POA: Diagnosis not present

## 2021-06-24 DIAGNOSIS — Z483 Aftercare following surgery for neoplasm: Secondary | ICD-10-CM | POA: Diagnosis not present

## 2021-06-24 DIAGNOSIS — Z86718 Personal history of other venous thrombosis and embolism: Secondary | ICD-10-CM | POA: Diagnosis not present

## 2021-06-24 DIAGNOSIS — Z7901 Long term (current) use of anticoagulants: Secondary | ICD-10-CM | POA: Diagnosis not present

## 2021-06-24 DIAGNOSIS — I10 Essential (primary) hypertension: Secondary | ICD-10-CM | POA: Diagnosis not present

## 2021-06-29 ENCOUNTER — Ambulatory Visit: Payer: Medicaid Other | Admitting: Hematology and Oncology

## 2021-06-30 DIAGNOSIS — C7989 Secondary malignant neoplasm of other specified sites: Secondary | ICD-10-CM | POA: Diagnosis not present

## 2021-06-30 DIAGNOSIS — H9190 Unspecified hearing loss, unspecified ear: Secondary | ICD-10-CM | POA: Diagnosis not present

## 2021-06-30 DIAGNOSIS — Z7901 Long term (current) use of anticoagulants: Secondary | ICD-10-CM | POA: Diagnosis not present

## 2021-06-30 DIAGNOSIS — M179 Osteoarthritis of knee, unspecified: Secondary | ICD-10-CM | POA: Diagnosis not present

## 2021-06-30 DIAGNOSIS — A419 Sepsis, unspecified organism: Secondary | ICD-10-CM | POA: Diagnosis not present

## 2021-06-30 DIAGNOSIS — Z86718 Personal history of other venous thrombosis and embolism: Secondary | ICD-10-CM | POA: Diagnosis not present

## 2021-06-30 DIAGNOSIS — I10 Essential (primary) hypertension: Secondary | ICD-10-CM | POA: Diagnosis not present

## 2021-06-30 DIAGNOSIS — Z87891 Personal history of nicotine dependence: Secondary | ICD-10-CM | POA: Diagnosis not present

## 2021-06-30 DIAGNOSIS — K122 Cellulitis and abscess of mouth: Secondary | ICD-10-CM | POA: Diagnosis not present

## 2021-06-30 DIAGNOSIS — Z483 Aftercare following surgery for neoplasm: Secondary | ICD-10-CM | POA: Diagnosis not present

## 2021-07-02 DIAGNOSIS — R1313 Dysphagia, pharyngeal phase: Secondary | ICD-10-CM | POA: Diagnosis not present

## 2021-07-02 DIAGNOSIS — R1312 Dysphagia, oropharyngeal phase: Secondary | ICD-10-CM | POA: Diagnosis not present

## 2021-07-02 DIAGNOSIS — C964 Sarcoma of dendritic cells (accessory cells): Secondary | ICD-10-CM | POA: Diagnosis not present

## 2021-07-03 ENCOUNTER — Telehealth: Payer: Self-pay

## 2021-07-03 ENCOUNTER — Ambulatory Visit: Payer: Medicaid Other | Admitting: Hematology and Oncology

## 2021-07-03 NOTE — Telephone Encounter (Signed)
Called to check on patient as he no showed his appointment today.  Called patient's phone and patient's wife's phone and left voicemail to call back to reschedule. Gardiner Rhyme, RN

## 2021-07-14 DIAGNOSIS — R1313 Dysphagia, pharyngeal phase: Secondary | ICD-10-CM | POA: Diagnosis not present

## 2021-07-14 DIAGNOSIS — R1312 Dysphagia, oropharyngeal phase: Secondary | ICD-10-CM | POA: Diagnosis not present

## 2021-07-19 DIAGNOSIS — C099 Malignant neoplasm of tonsil, unspecified: Secondary | ICD-10-CM | POA: Diagnosis not present

## 2021-07-19 DIAGNOSIS — C7989 Secondary malignant neoplasm of other specified sites: Secondary | ICD-10-CM | POA: Diagnosis not present

## 2021-07-21 ENCOUNTER — Other Ambulatory Visit: Payer: Self-pay

## 2021-07-21 ENCOUNTER — Other Ambulatory Visit (HOSPITAL_COMMUNITY): Payer: Self-pay

## 2021-07-21 DIAGNOSIS — R1312 Dysphagia, oropharyngeal phase: Secondary | ICD-10-CM | POA: Diagnosis not present

## 2021-07-22 ENCOUNTER — Other Ambulatory Visit (HOSPITAL_COMMUNITY): Payer: Self-pay

## 2021-07-22 DIAGNOSIS — Z7901 Long term (current) use of anticoagulants: Secondary | ICD-10-CM | POA: Diagnosis not present

## 2021-07-22 DIAGNOSIS — Z483 Aftercare following surgery for neoplasm: Secondary | ICD-10-CM | POA: Diagnosis not present

## 2021-07-22 DIAGNOSIS — M179 Osteoarthritis of knee, unspecified: Secondary | ICD-10-CM | POA: Diagnosis not present

## 2021-07-22 DIAGNOSIS — C7989 Secondary malignant neoplasm of other specified sites: Secondary | ICD-10-CM | POA: Diagnosis not present

## 2021-07-22 DIAGNOSIS — Z86718 Personal history of other venous thrombosis and embolism: Secondary | ICD-10-CM | POA: Diagnosis not present

## 2021-07-22 DIAGNOSIS — A419 Sepsis, unspecified organism: Secondary | ICD-10-CM | POA: Diagnosis not present

## 2021-07-22 DIAGNOSIS — Z87891 Personal history of nicotine dependence: Secondary | ICD-10-CM | POA: Diagnosis not present

## 2021-07-22 DIAGNOSIS — K122 Cellulitis and abscess of mouth: Secondary | ICD-10-CM | POA: Diagnosis not present

## 2021-07-22 DIAGNOSIS — H9190 Unspecified hearing loss, unspecified ear: Secondary | ICD-10-CM | POA: Diagnosis not present

## 2021-07-22 DIAGNOSIS — I10 Essential (primary) hypertension: Secondary | ICD-10-CM | POA: Diagnosis not present

## 2021-07-29 ENCOUNTER — Other Ambulatory Visit (HOSPITAL_COMMUNITY): Payer: Self-pay

## 2021-07-30 ENCOUNTER — Other Ambulatory Visit (HOSPITAL_COMMUNITY): Payer: Self-pay

## 2021-08-19 DIAGNOSIS — C7989 Secondary malignant neoplasm of other specified sites: Secondary | ICD-10-CM | POA: Diagnosis not present

## 2021-08-19 DIAGNOSIS — C099 Malignant neoplasm of tonsil, unspecified: Secondary | ICD-10-CM | POA: Diagnosis not present

## 2021-08-24 ENCOUNTER — Other Ambulatory Visit (HOSPITAL_COMMUNITY): Payer: Self-pay

## 2021-09-04 ENCOUNTER — Other Ambulatory Visit (HOSPITAL_COMMUNITY): Payer: Self-pay

## 2021-09-04 ENCOUNTER — Other Ambulatory Visit: Payer: Self-pay | Admitting: Internal Medicine

## 2021-09-04 DIAGNOSIS — I10 Essential (primary) hypertension: Secondary | ICD-10-CM

## 2021-09-04 MED ORDER — AMLODIPINE BESYLATE 10 MG PO TABS
10.0000 mg | ORAL_TABLET | Freq: Every day | ORAL | 2 refills | Status: DC
  Filled 2021-09-04: qty 30, 30d supply, fill #0
  Filled 2021-10-08: qty 30, 30d supply, fill #1
  Filled 2021-11-06: qty 30, 30d supply, fill #2

## 2021-09-14 DIAGNOSIS — Z85818 Personal history of malignant neoplasm of other sites of lip, oral cavity, and pharynx: Secondary | ICD-10-CM | POA: Diagnosis not present

## 2021-09-14 DIAGNOSIS — I898 Other specified noninfective disorders of lymphatic vessels and lymph nodes: Secondary | ICD-10-CM | POA: Diagnosis not present

## 2021-09-14 DIAGNOSIS — H748X2 Other specified disorders of left middle ear and mastoid: Secondary | ICD-10-CM | POA: Diagnosis not present

## 2021-09-14 DIAGNOSIS — Z08 Encounter for follow-up examination after completed treatment for malignant neoplasm: Secondary | ICD-10-CM | POA: Diagnosis not present

## 2021-09-14 DIAGNOSIS — C964 Sarcoma of dendritic cells (accessory cells): Secondary | ICD-10-CM | POA: Diagnosis not present

## 2021-09-14 DIAGNOSIS — M47812 Spondylosis without myelopathy or radiculopathy, cervical region: Secondary | ICD-10-CM | POA: Diagnosis not present

## 2021-09-14 DIAGNOSIS — K1370 Unspecified lesions of oral mucosa: Secondary | ICD-10-CM | POA: Diagnosis not present

## 2021-09-14 DIAGNOSIS — Z9889 Other specified postprocedural states: Secondary | ICD-10-CM | POA: Diagnosis not present

## 2021-09-14 DIAGNOSIS — J3489 Other specified disorders of nose and nasal sinuses: Secondary | ICD-10-CM | POA: Diagnosis not present

## 2021-09-14 DIAGNOSIS — J432 Centrilobular emphysema: Secondary | ICD-10-CM | POA: Diagnosis not present

## 2021-09-16 DIAGNOSIS — C7989 Secondary malignant neoplasm of other specified sites: Secondary | ICD-10-CM | POA: Diagnosis not present

## 2021-09-16 DIAGNOSIS — C099 Malignant neoplasm of tonsil, unspecified: Secondary | ICD-10-CM | POA: Diagnosis not present

## 2021-09-21 DIAGNOSIS — C099 Malignant neoplasm of tonsil, unspecified: Secondary | ICD-10-CM | POA: Diagnosis not present

## 2021-09-21 DIAGNOSIS — Z85818 Personal history of malignant neoplasm of other sites of lip, oral cavity, and pharynx: Secondary | ICD-10-CM | POA: Diagnosis not present

## 2021-09-21 DIAGNOSIS — Z923 Personal history of irradiation: Secondary | ICD-10-CM | POA: Diagnosis not present

## 2021-09-21 DIAGNOSIS — C964 Sarcoma of dendritic cells (accessory cells): Secondary | ICD-10-CM | POA: Diagnosis not present

## 2021-09-21 DIAGNOSIS — Z9089 Acquired absence of other organs: Secondary | ICD-10-CM | POA: Diagnosis not present

## 2021-09-25 ENCOUNTER — Other Ambulatory Visit (HOSPITAL_COMMUNITY): Payer: Self-pay

## 2021-09-25 ENCOUNTER — Other Ambulatory Visit: Payer: Self-pay | Admitting: Internal Medicine

## 2021-09-25 DIAGNOSIS — I2699 Other pulmonary embolism without acute cor pulmonale: Secondary | ICD-10-CM

## 2021-09-25 NOTE — Telephone Encounter (Signed)
Requested medication (s) are due for refill today:   Yes ? ?Requested medication (s) are on the active medication list:   Yes ? ?Future visit scheduled:   No ? ? ?Last ordered: 01/12/2021 #30, 6 refills ? ?Returned because labs due per protocol  ? ?Requested Prescriptions  ?Pending Prescriptions Disp Refills  ? rivaroxaban (XARELTO) 20 MG TABS tablet 30 tablet 6  ?  Sig: Take 1 tablet (20 mg total) by mouth daily with supper.  ?  ? Hematology: Anticoagulants - rivaroxaban Failed - 09/25/2021  2:01 PM  ?  ?  Failed - Cr in normal range and within 360 days  ?  Creatinine  ?Date Value Ref Range Status  ?01/30/2021 1.68 (H) 0.61 - 1.24 mg/dL Final  ? ?Creatinine, Ser  ?Date Value Ref Range Status  ?06/15/2021 1.31 (H) 0.61 - 1.24 mg/dL Final  ?  ?  ?  ?  Failed - HCT in normal range and within 360 days  ?  HCT  ?Date Value Ref Range Status  ?06/15/2021 20.5 (L) 39.0 - 52.0 % Final  ? ?Hematocrit  ?Date Value Ref Range Status  ?12/10/2019 44.5 37.5 - 51.0 % Final  ?  ?  ?  ?  Failed - HGB in normal range and within 360 days  ?  Hemoglobin  ?Date Value Ref Range Status  ?06/15/2021 6.8 (LL) 13.0 - 17.0 g/dL Final  ?  Comment:  ?  This critical result has verified and been called to [facility] RN by Marella Bile on 11 28 2022 at 0853, and has been read back.  ?This critical result has verified and been called to B. SEVOY by Marella Bile on 11 28 2022 at 0900, and has been read back.  ?  ?11/04/2020 12.2 (L) 13.0 - 17.0 g/dL Final  ?12/10/2019 14.9 13.0 - 17.7 g/dL Final  ?  ?  ?  ?  Passed - ALT in normal range and within 360 days  ?  ALT  ?Date Value Ref Range Status  ?06/14/2021 21 0 - 44 U/L Final  ?01/30/2021 <6 0 - 44 U/L Final  ?  ?  ?  ?  Passed - AST in normal range and within 360 days  ?  AST  ?Date Value Ref Range Status  ?06/14/2021 24 15 - 41 U/L Final  ?01/30/2021 8 (L) 15 - 41 U/L Final  ?  ?  ?  ?  Passed - PLT in normal range and within 360 days  ?  Platelets  ?Date Value Ref Range Status  ?06/15/2021  152 150 - 400 K/uL Final  ?12/10/2019 209 150 - 450 x10E3/uL Final  ? ?Platelet Count  ?Date Value Ref Range Status  ?11/04/2020 122 (L) 150 - 400 K/uL Final  ?  ?  ?  ?  Passed - eGFR is 15 or above and within 360 days  ?  GFR calc Af Amer  ?Date Value Ref Range Status  ?12/10/2019 89 >59 mL/min/1.73 Final  ?  Comment:  ?  **[facility] currently reports eGFR in compliance with the current** ?  recommendations of the [employer]. [facility] will ?  update reporting as new guidelines are published from the NKF-ASN ?  Task force. ?  ? ?GFR, Estimated  ?Date Value Ref Range Status  ?06/15/2021 >60 >60 mL/min Final  ?  Comment:  ?  (NOTE) ?Calculated using the CKD-EPI Creatinine Equation (2021) ?  ?01/30/2021 47 (L) >60 mL/min Final  ?  Comment:  ?  (  NOTE) ?Calculated using the CKD-EPI Creatinine Equation (2021) ?  ?  ?  ?  ?  Passed - Patient is not pregnant  ?  ?  Passed - Valid encounter within last 12 months  ?  Recent Outpatient Visits   ? ?      ? 4 months ago Hospital discharge follow-up  ? [place] Ladell Pier, MD  ? 9 months ago Essential hypertension  ? [place] Ladell Pier, MD  ? 1 year ago Essential hypertension  ? [place], RPH-CPP  ? 1 year ago Essential hypertension  ? [place] Ladell Pier, MD  ? 1 year ago Mass of left side of neck  ? [facility], Darrick Penna, MD  ? ?  ?  ? ?  ?  ?  ? ?

## 2021-09-25 NOTE — Telephone Encounter (Signed)
Medication Refill - Medication:  ?rivaroxaban (XARELTO) 20 MG TABS tablet  ? ? ?Has the patient contacted their pharmacy? Yes.   ?Pharmacy called on behalf of pt ? ?Preferred Pharmacy (with phone number or street name):  ?[facility]  ?[address] [place] [address]  ?Phone:  [phone]  Fax:  [phone]  ? ?Has the patient been seen for an appointment in the last year OR does the patient have an upcoming appointment? Yes.   ? ?Agent: Please be advised that RX refills may take up to 3 business days. We ask that you follow-up with your pharmacy. ?

## 2021-09-25 NOTE — Telephone Encounter (Signed)
Requested medication (s) are due for refill today:   Yes ? ?Requested medication (s) are on the active medication list:   Yes ? ?Future visit scheduled:   No ? ? ?Last ordered: 01/12/2021 #30, 6 refills ? ?Returned because protocol criteria not met.   Labs due and out of range.  ? ?Requested Prescriptions  ?Pending Prescriptions Disp Refills  ? rivaroxaban (XARELTO) 20 MG TABS tablet 30 tablet 6  ?  Sig: Take 1 tablet (20 mg total) by mouth daily with supper.  ?  ? Hematology: Anticoagulants - rivaroxaban Failed - 09/25/2021  2:57 PM  ?  ?  Failed - Cr in normal range and within 360 days  ?  Creatinine  ?Date Value Ref Range Status  ?01/30/2021 1.68 (H) 0.61 - 1.24 mg/dL Final  ? ?Creatinine, Ser  ?Date Value Ref Range Status  ?06/15/2021 1.31 (H) 0.61 - 1.24 mg/dL Final  ?  ?  ?  ?  Failed - HCT in normal range and within 360 days  ?  HCT  ?Date Value Ref Range Status  ?06/15/2021 20.5 (L) 39.0 - 52.0 % Final  ? ?Hematocrit  ?Date Value Ref Range Status  ?12/10/2019 44.5 37.5 - 51.0 % Final  ?  ?  ?  ?  Failed - HGB in normal range and within 360 days  ?  Hemoglobin  ?Date Value Ref Range Status  ?06/15/2021 6.8 (LL) 13.0 - 17.0 g/dL Final  ?  Comment:  ?  This critical result has verified and been called to [facility] RN by Marella Bile on 11 28 2022 at 0853, and has been read back.  ?This critical result has verified and been called to B. SEVOY by Marella Bile on 11 28 2022 at 0900, and has been read back.  ?  ?11/04/2020 12.2 (L) 13.0 - 17.0 g/dL Final  ?12/10/2019 14.9 13.0 - 17.7 g/dL Final  ?  ?  ?  ?  Passed - ALT in normal range and within 360 days  ?  ALT  ?Date Value Ref Range Status  ?06/14/2021 21 0 - 44 U/L Final  ?01/30/2021 <6 0 - 44 U/L Final  ?  ?  ?  ?  Passed - AST in normal range and within 360 days  ?  AST  ?Date Value Ref Range Status  ?06/14/2021 24 15 - 41 U/L Final  ?01/30/2021 8 (L) 15 - 41 U/L Final  ?  ?  ?  ?  Passed - PLT in normal range and within 360 days  ?  Platelets  ?Date  Value Ref Range Status  ?06/15/2021 152 150 - 400 K/uL Final  ?12/10/2019 209 150 - 450 x10E3/uL Final  ? ?Platelet Count  ?Date Value Ref Range Status  ?11/04/2020 122 (L) 150 - 400 K/uL Final  ?  ?  ?  ?  Passed - eGFR is 15 or above and within 360 days  ?  GFR calc Af Amer  ?Date Value Ref Range Status  ?12/10/2019 89 >59 mL/min/1.73 Final  ?  Comment:  ?  **[facility] currently reports eGFR in compliance with the current** ?  recommendations of the [employer]. [facility] will ?  update reporting as new guidelines are published from the NKF-ASN ?  Task force. ?  ? ?GFR, Estimated  ?Date Value Ref Range Status  ?06/15/2021 >60 >60 mL/min Final  ?  Comment:  ?  (NOTE) ?Calculated using the CKD-EPI Creatinine Equation (2021) ?  ?01/30/2021 47 (  L) >60 mL/min Final  ?  Comment:  ?  (NOTE) ?Calculated using the CKD-EPI Creatinine Equation (2021) ?  ?  ?  ?  ?  Passed - Patient is not pregnant  ?  ?  Passed - Valid encounter within last 12 months  ?  Recent Outpatient Visits   ? ?      ? 4 months ago Hospital discharge follow-up  ? [place] Ladell Pier, MD  ? 9 months ago Essential hypertension  ? [place] Ladell Pier, MD  ? 1 year ago Essential hypertension  ? [place], RPH-CPP  ? 1 year ago Essential hypertension  ? [place] Ladell Pier, MD  ? 1 year ago Mass of left side of neck  ? [facility], Darrick Penna, MD  ? ?  ?  ? ?  ?  ?  ? ?

## 2021-09-26 ENCOUNTER — Other Ambulatory Visit: Payer: Self-pay | Admitting: Internal Medicine

## 2021-09-26 ENCOUNTER — Other Ambulatory Visit (HOSPITAL_COMMUNITY): Payer: Self-pay

## 2021-09-26 DIAGNOSIS — I2699 Other pulmonary embolism without acute cor pulmonale: Secondary | ICD-10-CM

## 2021-09-27 ENCOUNTER — Telehealth: Payer: Self-pay | Admitting: Internal Medicine

## 2021-09-27 DIAGNOSIS — I2699 Other pulmonary embolism without acute cor pulmonale: Secondary | ICD-10-CM

## 2021-09-27 MED ORDER — RIVAROXABAN 20 MG PO TABS
20.0000 mg | ORAL_TABLET | Freq: Every day | ORAL | 1 refills | Status: DC
  Filled 2021-09-27: qty 30, 30d supply, fill #0
  Filled 2021-10-26: qty 30, 30d supply, fill #1

## 2021-09-27 NOTE — Telephone Encounter (Signed)
PC placed to pt today to discussed RF on Xarelto. ?I left message on VM [phone] informing him that I received a message from the pharmacy 2 days ago inquiring about RF on Xarelto.  According to pharmacy tech who reached out to pt, pt was requesting RF and it appeared that med was d/c and pt was not aware of this or by whom.  I reviewed his chart and I do not see where it was d/c either.   ?Advised however that he comes to lab to have CBC done as last one was done 3 mths ago.  I will send RF on Xarelto in the mean time. ?

## 2021-09-28 ENCOUNTER — Other Ambulatory Visit (HOSPITAL_COMMUNITY): Payer: Self-pay

## 2021-09-29 ENCOUNTER — Other Ambulatory Visit (HOSPITAL_COMMUNITY): Payer: Self-pay

## 2021-10-08 ENCOUNTER — Other Ambulatory Visit (HOSPITAL_COMMUNITY): Payer: Self-pay

## 2021-10-26 ENCOUNTER — Other Ambulatory Visit (HOSPITAL_COMMUNITY): Payer: Self-pay

## 2021-11-06 ENCOUNTER — Other Ambulatory Visit (HOSPITAL_COMMUNITY): Payer: Self-pay

## 2021-11-06 ENCOUNTER — Other Ambulatory Visit: Payer: Self-pay | Admitting: Internal Medicine

## 2021-11-06 DIAGNOSIS — I2699 Other pulmonary embolism without acute cor pulmonale: Secondary | ICD-10-CM

## 2021-11-06 MED ORDER — RIVAROXABAN 20 MG PO TABS
20.0000 mg | ORAL_TABLET | Freq: Every day | ORAL | 2 refills | Status: DC
  Filled 2021-11-06 – 2021-11-27 (×2): qty 30, 30d supply, fill #0
  Filled 2021-12-28: qty 30, 30d supply, fill #1
  Filled 2022-01-29: qty 30, 30d supply, fill #2

## 2021-11-06 NOTE — Telephone Encounter (Signed)
Requested Prescriptions  ?Pending Prescriptions Disp Refills  ?? rivaroxaban (XARELTO) 20 MG TABS tablet 30 tablet 1  ?  Sig: Take 1 tablet (20 mg total) by mouth daily with supper.  ?  ? Hematology: Anticoagulants - rivaroxaban Failed - 11/06/2021  3:23 PM  ?  ?  Failed - Cr in normal range and within 360 days  ?  Creatinine  ?Date Value Ref Range Status  ?01/30/2021 1.68 (H) 0.61 - 1.24 mg/dL Final  ? ?Creatinine, Ser  ?Date Value Ref Range Status  ?06/15/2021 1.31 (H) 0.61 - 1.24 mg/dL Final  ?   ?  ?  Failed - HCT in normal range and within 360 days  ?  HCT  ?Date Value Ref Range Status  ?06/15/2021 20.5 (L) 39.0 - 52.0 % Final  ? ?Hematocrit  ?Date Value Ref Range Status  ?12/10/2019 44.5 37.5 - 51.0 % Final  ?   ?  ?  Failed - HGB in normal range and within 360 days  ?  Hemoglobin  ?Date Value Ref Range Status  ?06/15/2021 6.8 (LL) 13.0 - 17.0 g/dL Final  ?  Comment:  ?  This critical result has verified and been called to [facility] RN by Marella Bile on 11 28 2022 at 0853, and has been read back.  ?This critical result has verified and been called to B. SEVOY by Marella Bile on 11 28 2022 at 0900, and has been read back.  ?  ?11/04/2020 12.2 (L) 13.0 - 17.0 g/dL Final  ?12/10/2019 14.9 13.0 - 17.7 g/dL Final  ?   ?  ?  Passed - ALT in normal range and within 360 days  ?  ALT  ?Date Value Ref Range Status  ?06/14/2021 21 0 - 44 U/L Final  ?01/30/2021 <6 0 - 44 U/L Final  ?   ?  ?  Passed - AST in normal range and within 360 days  ?  AST  ?Date Value Ref Range Status  ?06/14/2021 24 15 - 41 U/L Final  ?01/30/2021 8 (L) 15 - 41 U/L Final  ?   ?  ?  Passed - PLT in normal range and within 360 days  ?  Platelets  ?Date Value Ref Range Status  ?06/15/2021 152 150 - 400 K/uL Final  ?12/10/2019 209 150 - 450 x10E3/uL Final  ? ?Platelet Count  ?Date Value Ref Range Status  ?11/04/2020 122 (L) 150 - 400 K/uL Final  ?   ?  ?  Passed - eGFR is 15 or above and within 360 days  ?  GFR calc Af Amer  ?Date Value Ref  Range Status  ?12/10/2019 89 >59 mL/min/1.73 Final  ?  Comment:  ?  **[facility] currently reports eGFR in compliance with the current** ?  recommendations of the [employer]. [facility] will ?  update reporting as new guidelines are published from the NKF-ASN ?  Task force. ?  ? ?GFR, Estimated  ?Date Value Ref Range Status  ?06/15/2021 >60 >60 mL/min Final  ?  Comment:  ?  (NOTE) ?Calculated using the CKD-EPI Creatinine Equation (2021) ?  ?01/30/2021 47 (L) >60 mL/min Final  ?  Comment:  ?  (NOTE) ?Calculated using the CKD-EPI Creatinine Equation (2021) ?  ?   ?  ?  Passed - Patient is not pregnant  ?  ?  Passed - Valid encounter within last 12 months  ?  Recent Outpatient Visits   ?      ?  5 months ago Hospital discharge follow-up  ? [place] Ladell Pier, MD  ? 11 months ago Essential hypertension  ? [place] Ladell Pier, MD  ? 1 year ago Essential hypertension  ? [place], RPH-CPP  ? 1 year ago Essential hypertension  ? [place] Ladell Pier, MD  ? 1 year ago Mass of left side of neck  ? [facility], Darrick Penna, MD  ?  ?  ? ?  ?  ?  ? ? ?

## 2021-11-16 DIAGNOSIS — C7989 Secondary malignant neoplasm of other specified sites: Secondary | ICD-10-CM | POA: Diagnosis not present

## 2021-11-16 DIAGNOSIS — C099 Malignant neoplasm of tonsil, unspecified: Secondary | ICD-10-CM | POA: Diagnosis not present

## 2021-11-27 ENCOUNTER — Other Ambulatory Visit (HOSPITAL_COMMUNITY): Payer: Self-pay

## 2021-12-16 ENCOUNTER — Other Ambulatory Visit (HOSPITAL_COMMUNITY): Payer: Self-pay

## 2021-12-16 ENCOUNTER — Other Ambulatory Visit: Payer: Self-pay | Admitting: Internal Medicine

## 2021-12-16 DIAGNOSIS — I10 Essential (primary) hypertension: Secondary | ICD-10-CM

## 2021-12-16 MED ORDER — AMLODIPINE BESYLATE 10 MG PO TABS
10.0000 mg | ORAL_TABLET | Freq: Every day | ORAL | 0 refills | Status: DC
  Filled 2021-12-16: qty 30, 30d supply, fill #0

## 2021-12-17 DIAGNOSIS — C7989 Secondary malignant neoplasm of other specified sites: Secondary | ICD-10-CM | POA: Diagnosis not present

## 2021-12-17 DIAGNOSIS — C099 Malignant neoplasm of tonsil, unspecified: Secondary | ICD-10-CM | POA: Diagnosis not present

## 2021-12-28 ENCOUNTER — Other Ambulatory Visit (HOSPITAL_COMMUNITY): Payer: Self-pay

## 2022-01-16 DIAGNOSIS — C099 Malignant neoplasm of tonsil, unspecified: Secondary | ICD-10-CM | POA: Diagnosis not present

## 2022-01-16 DIAGNOSIS — C7989 Secondary malignant neoplasm of other specified sites: Secondary | ICD-10-CM | POA: Diagnosis not present

## 2022-01-25 ENCOUNTER — Other Ambulatory Visit (HOSPITAL_COMMUNITY): Payer: Self-pay

## 2022-01-25 ENCOUNTER — Other Ambulatory Visit: Payer: Self-pay | Admitting: Internal Medicine

## 2022-01-25 DIAGNOSIS — I10 Essential (primary) hypertension: Secondary | ICD-10-CM

## 2022-01-29 ENCOUNTER — Other Ambulatory Visit (HOSPITAL_COMMUNITY): Payer: Self-pay

## 2022-02-26 ENCOUNTER — Other Ambulatory Visit (HOSPITAL_COMMUNITY): Payer: Self-pay

## 2022-02-26 ENCOUNTER — Other Ambulatory Visit: Payer: Self-pay | Admitting: Internal Medicine

## 2022-02-26 DIAGNOSIS — I2699 Other pulmonary embolism without acute cor pulmonale: Secondary | ICD-10-CM

## 2022-02-26 MED ORDER — RIVAROXABAN 20 MG PO TABS
20.0000 mg | ORAL_TABLET | Freq: Every day | ORAL | 0 refills | Status: DC
  Filled 2022-02-26: qty 30, 30d supply, fill #0

## 2022-02-26 NOTE — Telephone Encounter (Signed)
Requested medication (s) are due for refill today: yes  Requested medication (s) are on the active medication list: yes  Last refill:  11/06/21 #30 with 2 RF  Future visit scheduled: no, last visit 05/28/2021  Notes to clinic:  This rx says has changes, please assess.      Requested Prescriptions  Pending Prescriptions Disp Refills   rivaroxaban (XARELTO) 20 MG TABS tablet 30 tablet 2    Sig: Take 1 tablet (20 mg total) by mouth daily with supper.     Hematology: Anticoagulants - rivaroxaban Failed - 02/26/2022  4:28 PM      Failed - Cr in normal range and within 360 days    Creatinine  Date Value Ref Range Status  01/30/2021 1.68 (H) 0.61 - 1.24 mg/dL Final   Creatinine, Ser  Date Value Ref Range Status  06/15/2021 1.31 (H) 0.61 - 1.24 mg/dL Final         Failed - HCT in normal range and within 360 days    HCT  Date Value Ref Range Status  06/15/2021 20.5 (L) 39.0 - 52.0 % Final   Hematocrit  Date Value Ref Range Status  12/10/2019 44.5 37.5 - 51.0 % Final         Failed - HGB in normal range and within 360 days    Hemoglobin  Date Value Ref Range Status  06/15/2021 6.8 (LL) 13.0 - 17.0 g/dL Final    Comment:    This critical result has verified and been called to [facility] RN by Marella Bile on 11 28 2022 at 0853, and has been read back.  This critical result has verified and been called to B. SEVOY by Marella Bile on 11 28 2022 at 0900, and has been read back.    11/04/2020 12.2 (L) 13.0 - 17.0 g/dL Final  12/10/2019 14.9 13.0 - 17.7 g/dL Final         Passed - ALT in normal range and within 360 days    ALT  Date Value Ref Range Status  06/14/2021 21 0 - 44 U/L Final  01/30/2021 <6 0 - 44 U/L Final         Passed - AST in normal range and within 360 days    AST  Date Value Ref Range Status  06/14/2021 24 15 - 41 U/L Final  01/30/2021 8 (L) 15 - 41 U/L Final         Passed - PLT in normal range and within 360 days    Platelets  Date Value  Ref Range Status  06/15/2021 152 150 - 400 K/uL Final  12/10/2019 209 150 - 450 x10E3/uL Final   Platelet Count  Date Value Ref Range Status  11/04/2020 122 (L) 150 - 400 K/uL Final         Passed - eGFR is 15 or above and within 360 days    GFR calc Af Amer  Date Value Ref Range Status  12/10/2019 89 >59 mL/min/1.73 Final    Comment:    **[facility] currently reports eGFR in compliance with the current**   recommendations of the [employer]. [facility] will   update reporting as new guidelines are published from the NKF-ASN   Task force.    GFR, Estimated  Date Value Ref Range Status  06/15/2021 >60 >60 mL/min Final    Comment:    (NOTE) Calculated using the CKD-EPI Creatinine Equation (2021)   01/30/2021 47 (L) >60 mL/min Final    Comment:    (  NOTE) Calculated using the CKD-EPI Creatinine Equation (2021)          Passed - Patient is not pregnant      Passed - Valid encounter within last 12 months    Recent Outpatient Visits           9 months ago Hospital discharge follow-up   [place] Ladell Pier, MD   1 year ago Essential hypertension   [place], Deborah B, MD   1 year ago Essential hypertension   [place], RPH-CPP   1 year ago Essential hypertension   [place] Ladell Pier, MD   1 year ago Mass of left side of neck   [facility], Darrick Penna, MD

## 2022-03-01 ENCOUNTER — Other Ambulatory Visit (HOSPITAL_COMMUNITY): Payer: Self-pay

## 2022-03-30 IMAGING — DX DG CHEST 1V PORT
2 series · 2 of 2 positions shown · non-contrast
Comparison: May 30, 2020

CLINICAL DATA: Bloody drainage from tracheostomy tube.

EXAM:
PORTABLE CHEST 1 VIEW

[chest ap (1 of 2)]
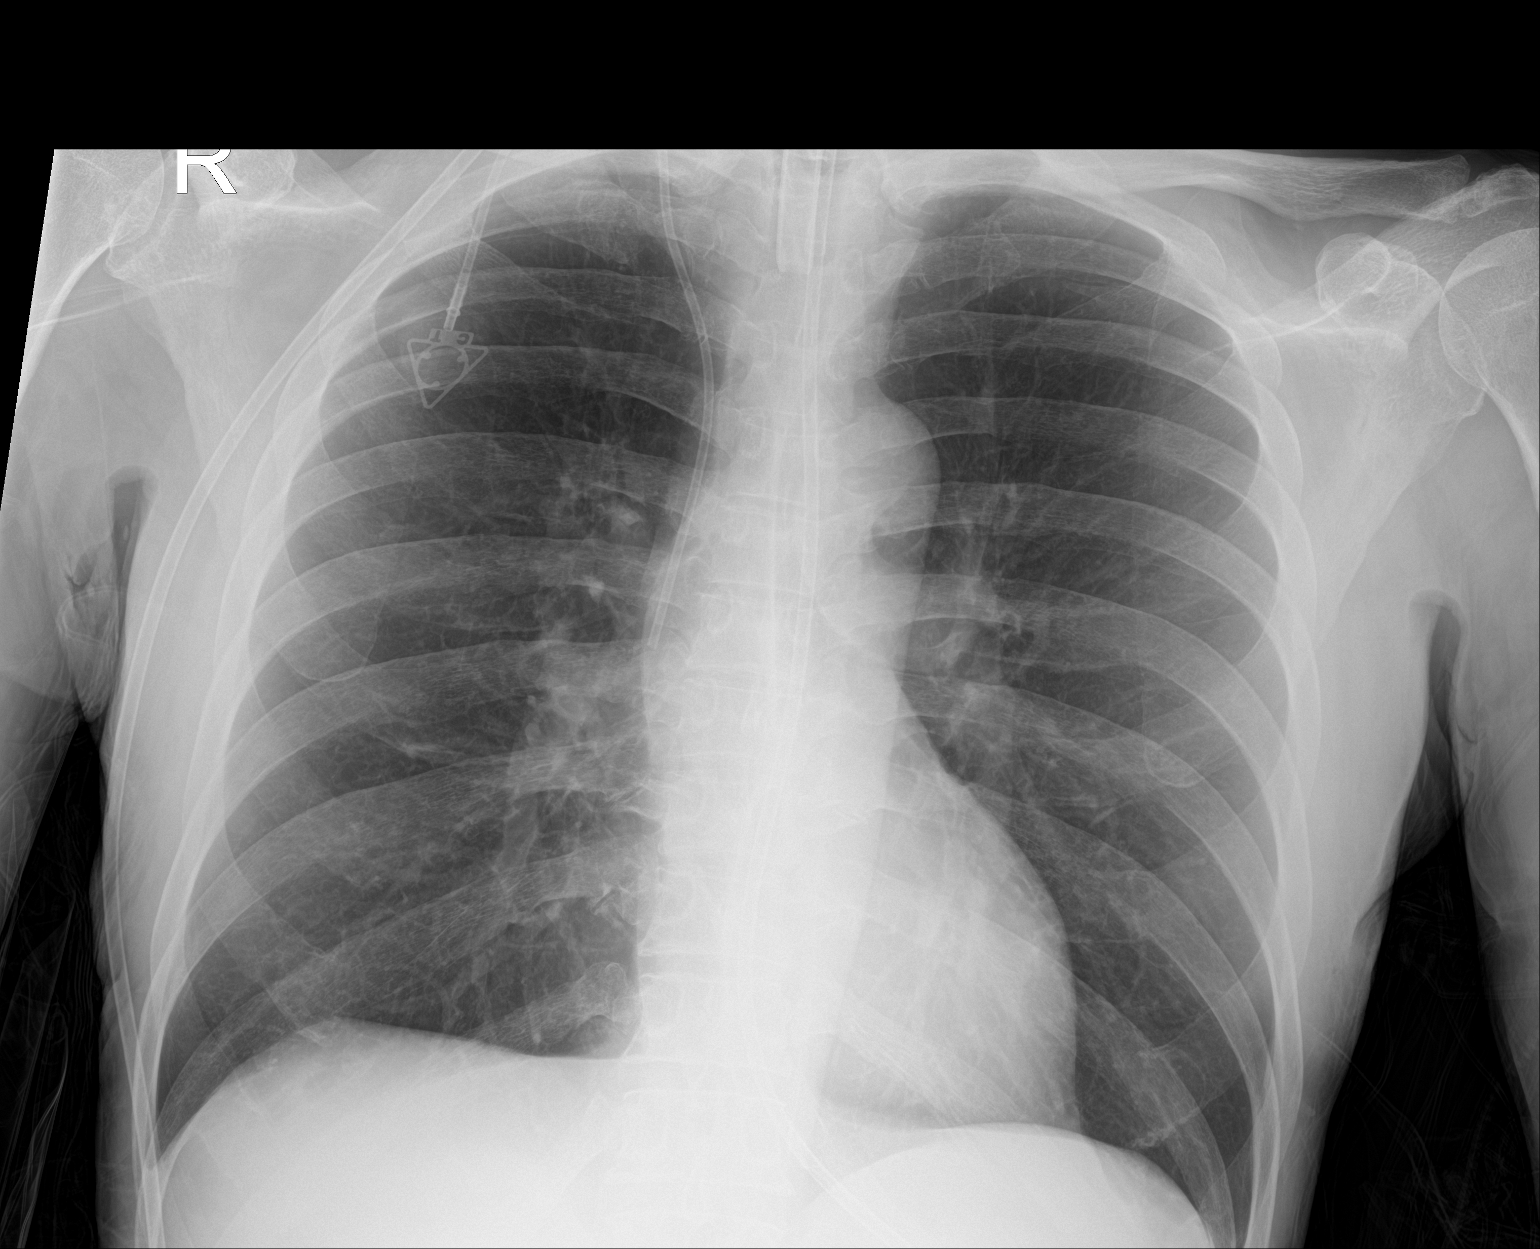

[chest ap (2 of 2)]
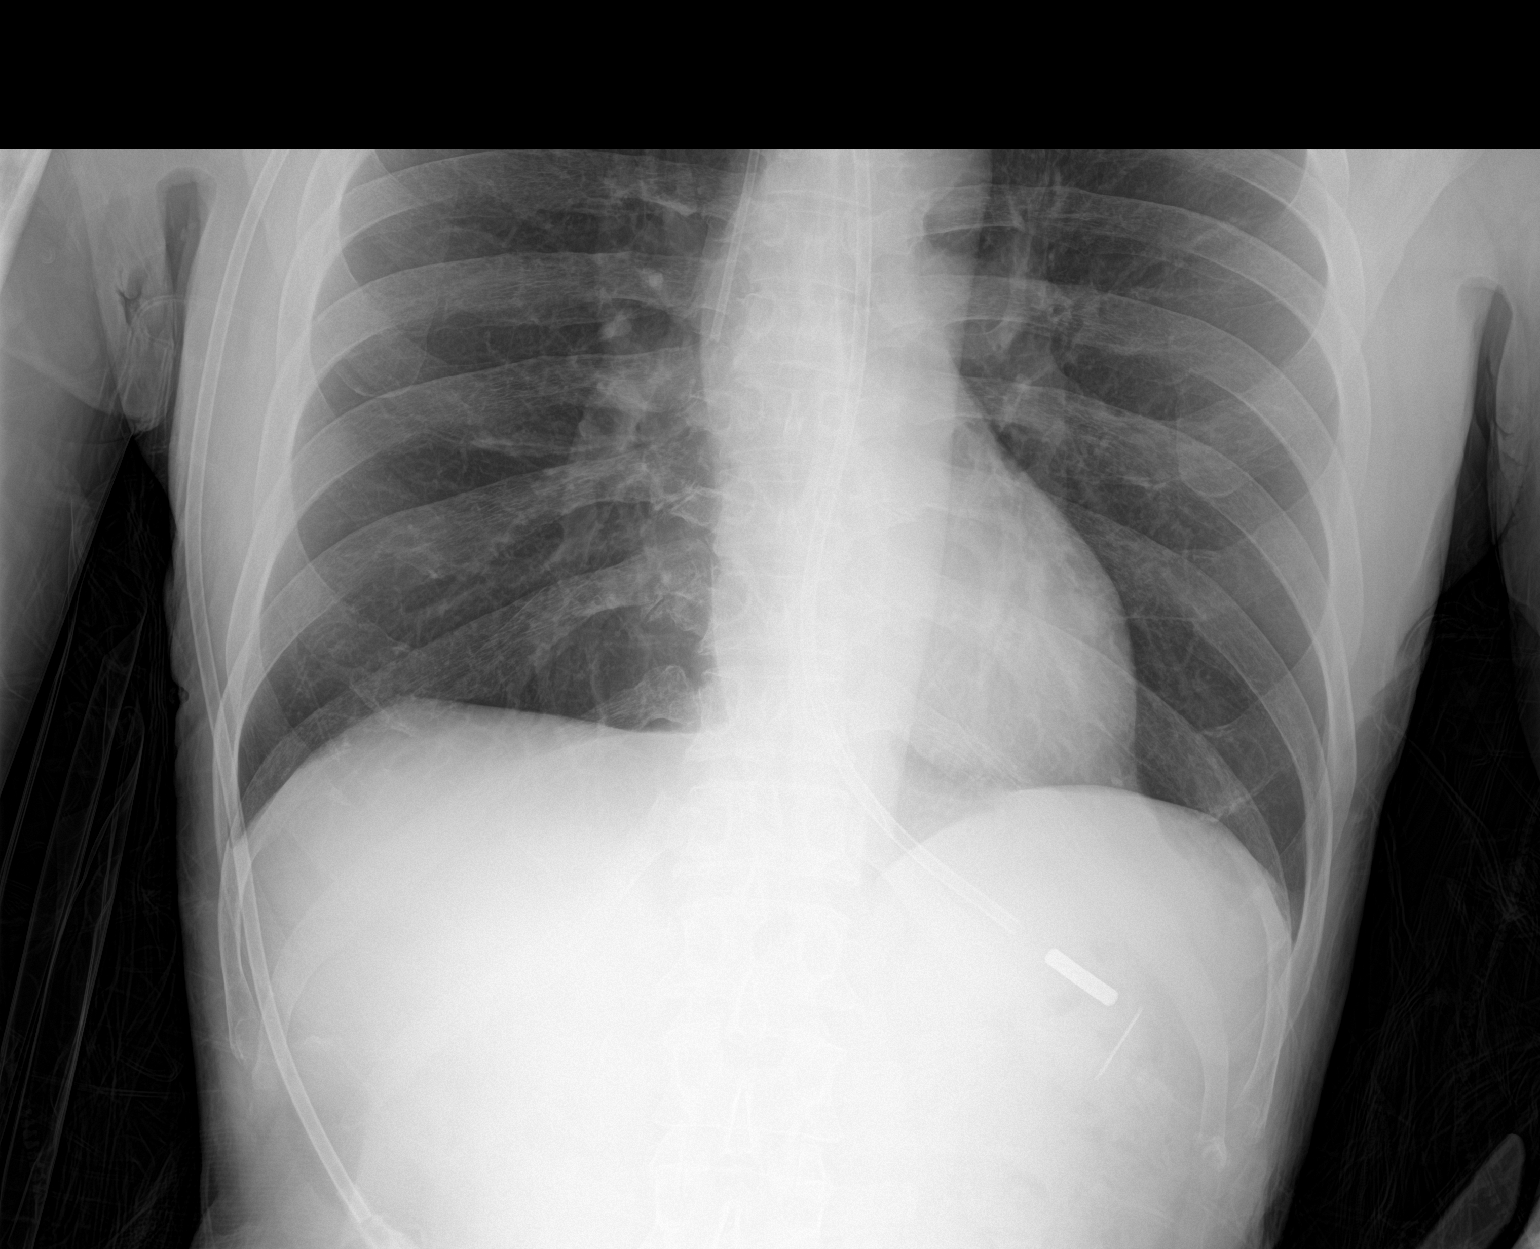

[2 of 2 positions shown; findings below may reference images not displayed]

FINDINGS: A tracheostomy tube is in place with its distal tip seen just beyond
the level of the clavicles. A right-sided venous Port-A-Cath is
noted with its distal tip approximately 7 mm proximal to the
junction of the superior vena cava and right atrium. A feeding tube
is noted with its radiopaque weighted tip overlying the body of the
stomach. The heart size and mediastinal contours are within normal
limits. Both lungs are clear. The visualized skeletal structures are
unremarkable.
IMPRESSION: No active disease.

## 2022-03-30 IMAGING — CT CT NECK W/ CM
3 of 4 series · 11 of 33 positions shown, 13 images · IV contrast (omnipaque)
Comparison: 03/16/2021.

CLINICAL DATA: Bloody drainage from tracheostomy tube, which was
placed on [DATE]. The the

EXAM:
CT NECK WITH CONTRAST
TECHNIQUE: Multidetector CT imaging of the neck was performed using the
standard protocol following the bolus administration of intravenous
contrast.
CONTRAST:  80mL OMNIPAQUE IOHEXOL 350 MG/ML SOLN

[Series 5: axial · axial · 0.39mm/px · z∈[-254,-69]mm · 3 of 166 slices shown, 4 images]
[im 48/166  soft-tissue]
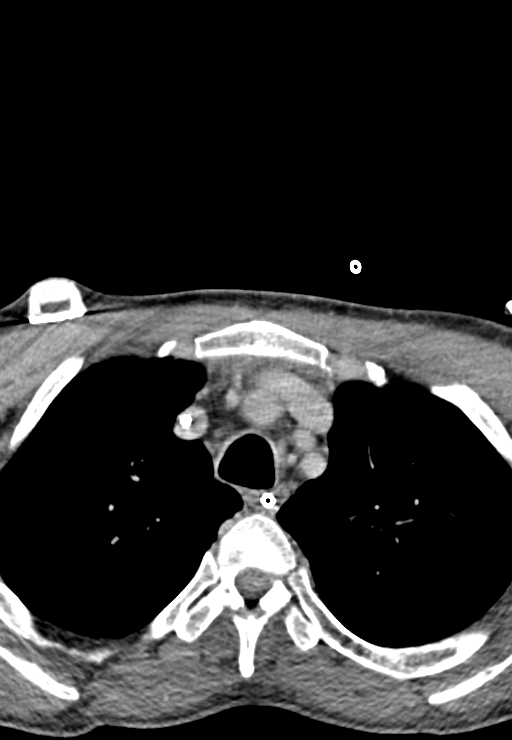
[im 48/166  bone]
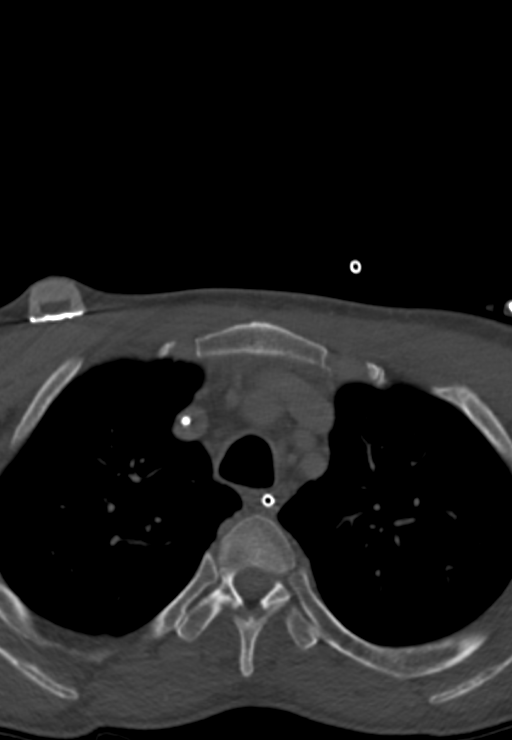
[im 95/166  bone]
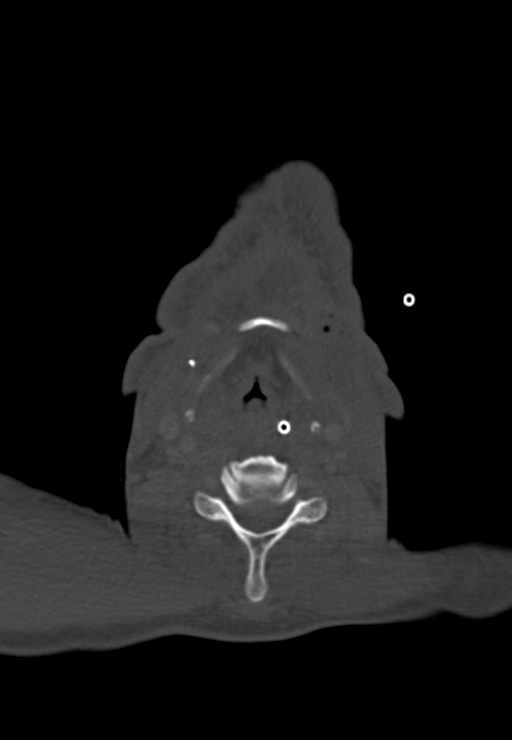
[im 142/166  bone]
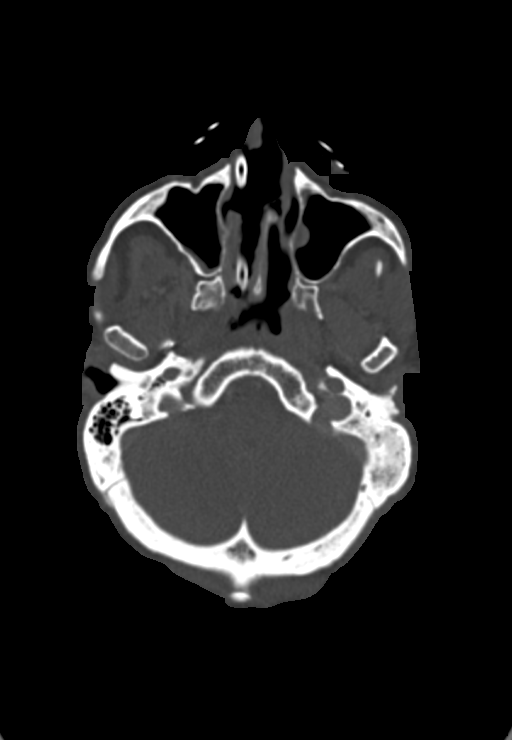

[Series 6: coronal · coronal · 0.39mm/px · 3 of 145 slices shown]
[im 29/145  bone]
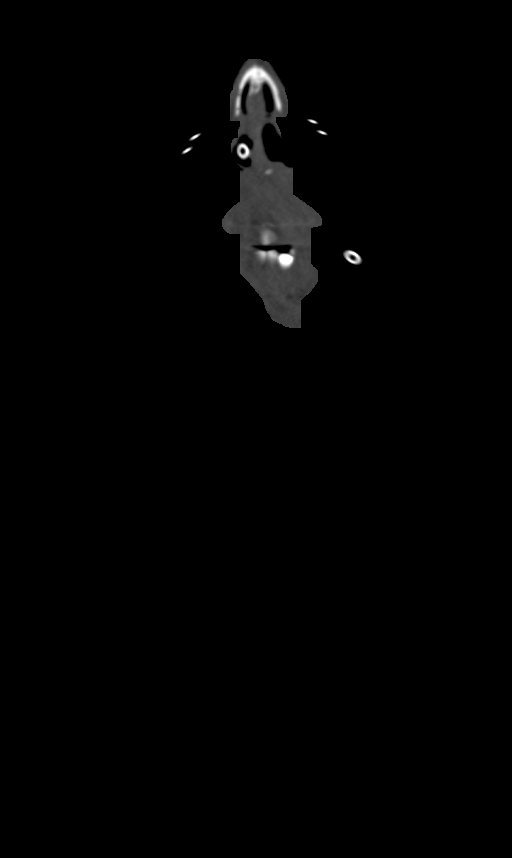
[im 58/145  bone]
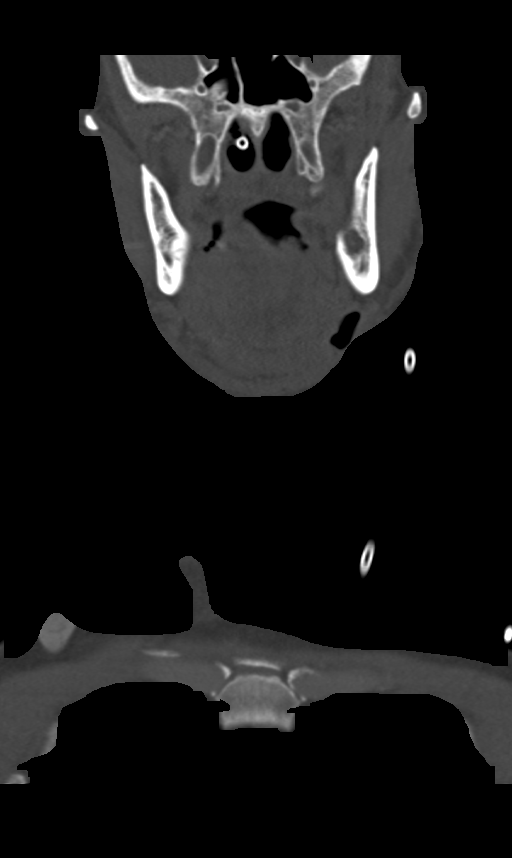
[im 87/145  bone]
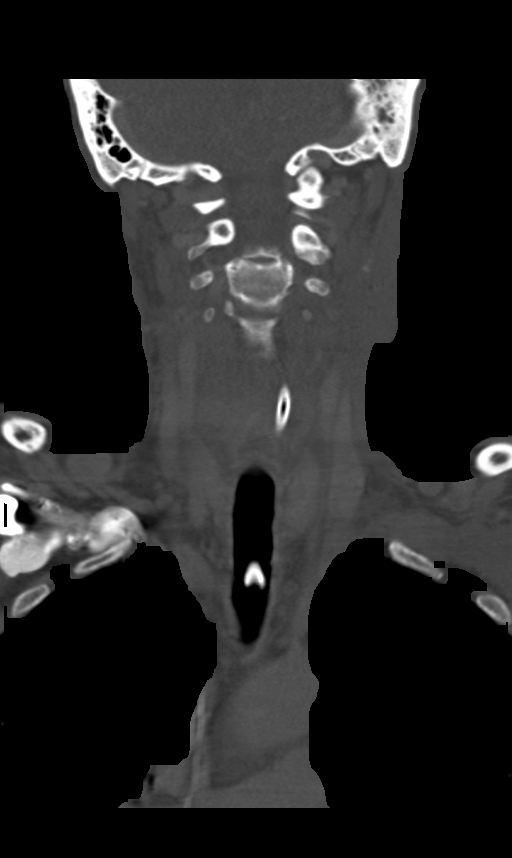

[Series 7: sagittal · sagittal · 0.56mm/px · 5 of 101 slices shown, 6 images]
[im 34/101  bone]
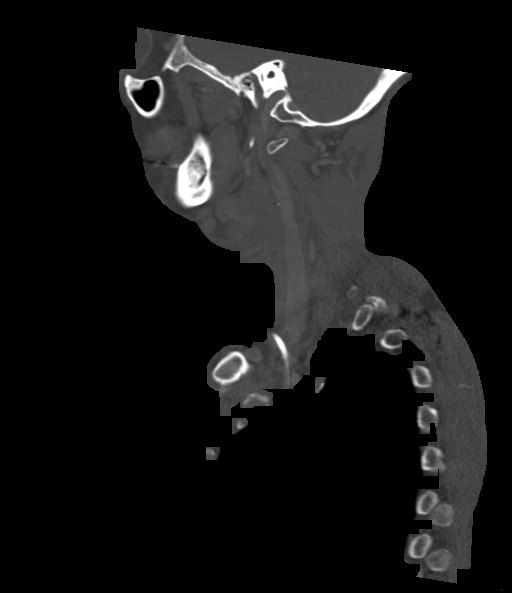
[im 42/101  bone]
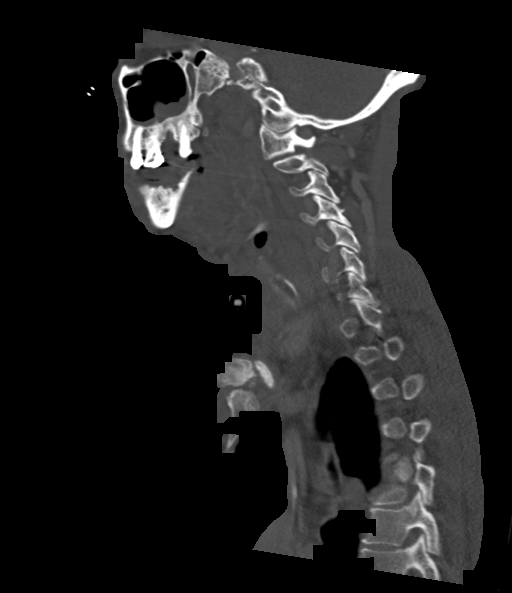
[im 51/101  soft-tissue]
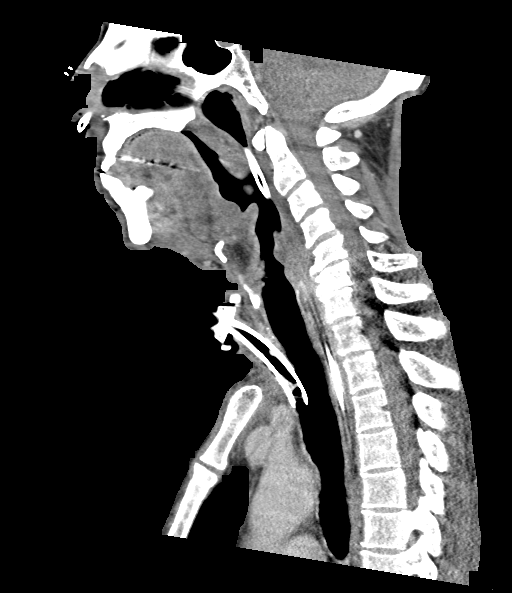
[im 51/101  bone]
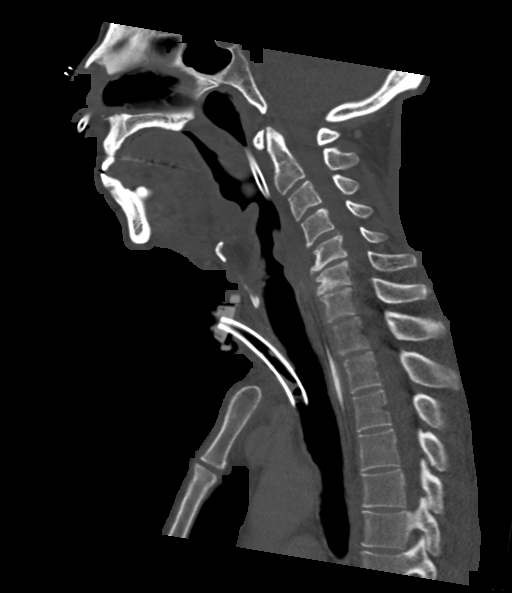
[im 59/101  bone]
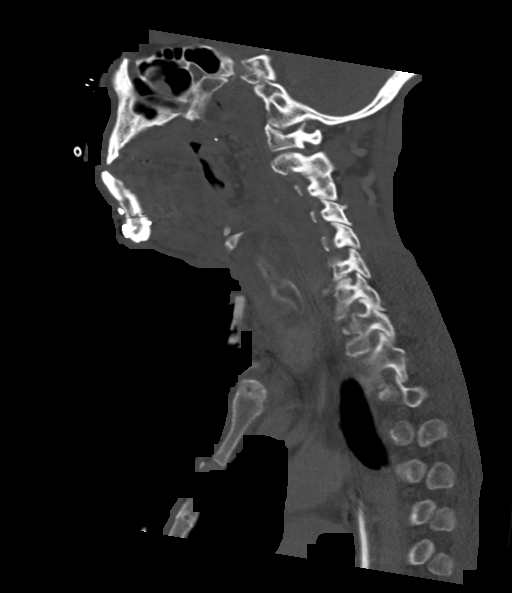
[im 67/101  bone]
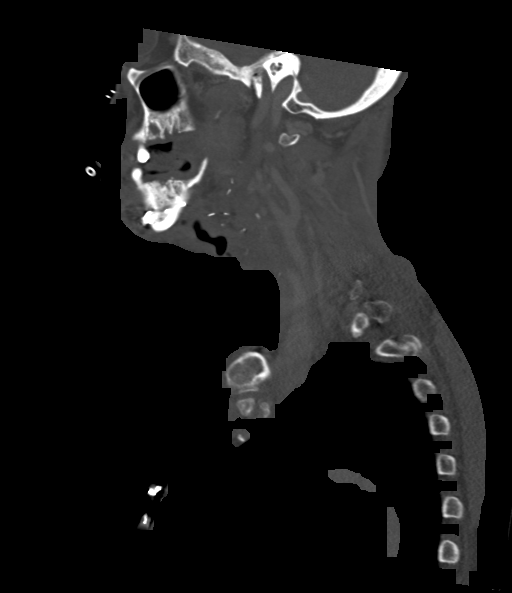

[11 of 33 positions shown; findings below may reference images not displayed]

FINDINGS: Pharynx and larynx: Low-density soft tissue without significant
enhancement in the left palatine tonsil, measuring up to 4.2 x 1.8 x
3.9 cm (series 2, image 34 and series 6, image 69), with blood
vessels are noted traversing the hypodensity, which is favored to
represent surgical fat graft. Edema in the hypopharynx and upper
larynx. No focal collection.

Tracheostomy tube noted in the trachea. No focal collection in the
area of the tracheostomy tube.

Salivary glands: Suspect surgical removal of the left submandibular
gland. The remainder of the salivary glands are unremarkable.

Thyroid: Normal.

Lymph nodes: Status post bilateral neck dissection; remaining lymph
nodes are not enlarged or abnormal density.

Vascular: Negative.

Limited intracranial: Negative.

Visualized orbits: Negative.

Mastoids and visualized paranasal sinuses: Mucosal thickening and
mucous retention cysts in the maxillary sinuses. Nasogastric tube.
Fluid in the left-greater-than-right mastoid air cells.

Skeleton: Degenerative changes with reversal of the normal cervical
lordosis. No acute osseous abnormality.

Upper chest: No focal pulmonary opacity or pleural effusion. Right
chest port with catheter tip extending off the inferior field of
view in the SVC.

Other: In the left submandibular soft tissues, there is a
low-density collection with an air-fluid level, which measures up to
3.9 x 1.1 x 2.7 cm (series 6, image 62 and series 2, image 56),
which tracks inferiorly from the mandible into the left neck. This
may be postsurgical or represent abscess with gas-forming organisms.
IMPRESSION: 1. Low-density collection with air-fluid level in the left
submandibular soft tissues, which may be postsurgical but is
concerning for abscess with gas-forming organisms.
2. Status post left tonsillectomy and pharyngectomy with fat graft
material in the left pharynx.
3. Tracheostomy tube in place, without evidence of focal collection
in the area.
4. No residual lymphadenopathy, status post bilateral neck
dissection.

## 2022-04-06 ENCOUNTER — Other Ambulatory Visit: Payer: Self-pay | Admitting: Internal Medicine

## 2022-04-06 ENCOUNTER — Other Ambulatory Visit (HOSPITAL_COMMUNITY): Payer: Self-pay

## 2022-04-06 DIAGNOSIS — I2699 Other pulmonary embolism without acute cor pulmonale: Secondary | ICD-10-CM

## 2022-04-06 DIAGNOSIS — I10 Essential (primary) hypertension: Secondary | ICD-10-CM

## 2022-04-07 NOTE — Telephone Encounter (Signed)
Requested medication (s) are due for refill today: yes  Requested medication (s) are on the active medication list: yes  Last refill:  amlodipine: 12/16/21 #30         xarelto: 02/26/22 #30  Future visit scheduled: yes   Notes to clinic:  pt made appt    Requested Prescriptions  Pending Prescriptions Disp Refills   amLODipine (NORVASC) 10 MG tablet 30 tablet 0    Sig: Take 1 tablet (10 mg total) by mouth daily.     Cardiovascular: Calcium Channel Blockers 2 Failed - 04/07/2022  1:06 PM      Failed - Last BP in normal range    BP Readings from Last 1 Encounters:  06/15/21 (!) 86/66         Failed - Valid encounter within last 6 months    Recent Outpatient Visits           10 months ago Hospital discharge follow-up   [place], Deborah B, MD   1 year ago Essential hypertension   [place], Deborah B, MD   1 year ago Essential hypertension   [place], Jarome Matin, RPH-CPP   1 year ago Essential hypertension   [place] Ladell Pier, MD   1 year ago Mass of left side of neck   [place], Darrick Penna, MD       Future Appointments             In 2 months Ladell Pier, MD [place] - Last Heart Rate in normal range    Pulse Readings from Last 1 Encounters:  06/15/21 (!) 108          rivaroxaban (XARELTO) 20 MG TABS tablet 30 tablet 0    Sig: Take 1 tablet (20 mg total) by mouth daily with supper.     Hematology: Anticoagulants - rivaroxaban Failed - 04/07/2022  1:06 PM      Failed - Cr in normal range and within 360 days    Creatinine  Date Value Ref Range Status  01/30/2021 1.68 (H) 0.61 - 1.24 mg/dL Final   Creatinine, Ser  Date Value Ref Range Status  06/15/2021 1.31 (H) 0.61 - 1.24 mg/dL Final          Failed - HCT in normal range and within 360 days    HCT  Date Value Ref Range Status  06/15/2021 20.5 (L) 39.0 - 52.0 % Final   Hematocrit  Date Value Ref Range Status  12/10/2019 44.5 37.5 - 51.0 % Final         Failed - HGB in normal range and within 360 days    Hemoglobin  Date Value Ref Range Status  06/15/2021 6.8 (LL) 13.0 - 17.0 g/dL Final    Comment:    This critical result has verified and been called to [facility] RN by Marella Bile on 11 28 2022 at 0853, and has been read back.  This critical result has verified and been called to B. SEVOY by Marella Bile on 11 28 2022 at 0900, and has been read back.    11/04/2020 12.2 (L) 13.0 - 17.0 g/dL Final  12/10/2019 14.9 13.0 - 17.7 g/dL Final         Passed -  ALT in normal range and within 360 days    ALT  Date Value Ref Range Status  06/14/2021 21 0 - 44 U/L Final  01/30/2021 <6 0 - 44 U/L Final         Passed - AST in normal range and within 360 days    AST  Date Value Ref Range Status  06/14/2021 24 15 - 41 U/L Final  01/30/2021 8 (L) 15 - 41 U/L Final         Passed - PLT in normal range and within 360 days    Platelets  Date Value Ref Range Status  06/15/2021 152 150 - 400 K/uL Final  12/10/2019 209 150 - 450 x10E3/uL Final   Platelet Count  Date Value Ref Range Status  11/04/2020 122 (L) 150 - 400 K/uL Final         Passed - eGFR is 15 or above and within 360 days    GFR calc Af Amer  Date Value Ref Range Status  12/10/2019 89 >59 mL/min/1.73 Final    Comment:    **[facility] currently reports eGFR in compliance with the current**   recommendations of the [employer]. [facility] will   update reporting as new guidelines are published from the NKF-ASN   Task force.    GFR, Estimated  Date Value Ref Range Status  06/15/2021 >60 >60 mL/min Final    Comment:    (NOTE) Calculated using the CKD-EPI Creatinine Equation (2021)   01/30/2021 47 (L) >60 mL/min Final    Comment:     (NOTE) Calculated using the CKD-EPI Creatinine Equation (2021)          Passed - Patient is not pregnant      Passed - Valid encounter within last 12 months    Recent Outpatient Visits           10 months ago Hospital discharge follow-up   [place], Deborah B, MD   1 year ago Essential hypertension   [place], Deborah B, MD   1 year ago Essential hypertension   [place], Jarome Matin, RPH-CPP   1 year ago Essential hypertension   [place] Ladell Pier, MD   1 year ago Mass of left side of neck   [place], Darrick Penna, MD       Future Appointments             In 2 months Ladell Pier, MD [place]            Refused Prescriptions Disp Refills   rivaroxaban (XARELTO) 20 MG TABS tablet 30 tablet 0    Sig: Take 1 tablet (20 mg total) by mouth daily with supper.     Hematology: Anticoagulants - rivaroxaban Failed - 04/07/2022  1:06 PM      Failed - Cr in normal range and within 360 days    Creatinine  Date Value Ref Range Status  01/30/2021 1.68 (H) 0.61 - 1.24 mg/dL Final   Creatinine, Ser  Date Value Ref Range Status  06/15/2021 1.31 (H) 0.61 - 1.24 mg/dL Final         Failed - HCT in normal range and within 360 days    HCT  Date Value Ref Range Status  06/15/2021 20.5 (L) 39.0 - 52.0 % Final  Hematocrit  Date Value Ref Range Status  12/10/2019 44.5 37.5 - 51.0 % Final         Failed - HGB in normal range and within 360 days    Hemoglobin  Date Value Ref Range Status  06/15/2021 6.8 (LL) 13.0 - 17.0 g/dL Final    Comment:    This critical result has verified and been called to [facility] RN by Marella Bile on 11 28 2022 at 0853, and has been read back.  This critical result has verified and been called to B. SEVOY by  Marella Bile on 11 28 2022 at 0900, and has been read back.    11/04/2020 12.2 (L) 13.0 - 17.0 g/dL Final  12/10/2019 14.9 13.0 - 17.7 g/dL Final         Passed - ALT in normal range and within 360 days    ALT  Date Value Ref Range Status  06/14/2021 21 0 - 44 U/L Final  01/30/2021 <6 0 - 44 U/L Final         Passed - AST in normal range and within 360 days    AST  Date Value Ref Range Status  06/14/2021 24 15 - 41 U/L Final  01/30/2021 8 (L) 15 - 41 U/L Final         Passed - PLT in normal range and within 360 days    Platelets  Date Value Ref Range Status  06/15/2021 152 150 - 400 K/uL Final  12/10/2019 209 150 - 450 x10E3/uL Final   Platelet Count  Date Value Ref Range Status  11/04/2020 122 (L) 150 - 400 K/uL Final         Passed - eGFR is 15 or above and within 360 days    GFR calc Af Amer  Date Value Ref Range Status  12/10/2019 89 >59 mL/min/1.73 Final    Comment:    **[facility] currently reports eGFR in compliance with the current**   recommendations of the [employer]. [facility] will   update reporting as new guidelines are published from the NKF-ASN   Task force.    GFR, Estimated  Date Value Ref Range Status  06/15/2021 >60 >60 mL/min Final    Comment:    (NOTE) Calculated using the CKD-EPI Creatinine Equation (2021)   01/30/2021 47 (L) >60 mL/min Final    Comment:    (NOTE) Calculated using the CKD-EPI Creatinine Equation (2021)          Passed - Patient is not pregnant      Passed - Valid encounter within last 12 months    Recent Outpatient Visits           10 months ago Hospital discharge follow-up   [place], Deborah B, MD   1 year ago Essential hypertension   [place], Deborah B, MD   1 year ago Essential hypertension   [place], RPH-CPP   1 year ago Essential hypertension   [place], Deborah B, MD   1 year ago Mass of left side of neck   [place], Darrick Penna, MD       Future Appointments             In 2 months Wynetta Emery Dalbert Batman, MD [place]  amLODipine (NORVASC) 10 MG tablet 30 tablet 0    Sig: Take 1 tablet (10 mg total) by mouth daily.     Cardiovascular: Calcium Channel Blockers 2 Failed - 04/07/2022  1:06 PM      Failed - Last BP in normal range    BP Readings from Last 1 Encounters:  06/15/21 (!) 86/66         Failed - Valid encounter within last 6 months    Recent Outpatient Visits           10 months ago Hospital discharge follow-up   [place], Deborah B, MD   1 year ago Essential hypertension   [place], Deborah B, MD   1 year ago Essential hypertension   [place], [place], RPH-CPP   1 year ago Essential hypertension   [place], Deborah B, MD   1 year ago Mass of left side of neck   [place], MD       Future Appointments             In 2 months Ladell Pier, MD [place]            Passed - Last Heart Rate in normal range    Pulse Readings from Last 1 Encounters:  06/15/21 (!) 108

## 2022-04-07 NOTE — Telephone Encounter (Signed)
Pt called and scheduled next available appt, wants to know if he can receive refills to last him until his scheduled time.  [id number] [id number] Phone:: [phone]: [phone]

## 2022-04-07 NOTE — Addendum Note (Signed)
Addended by: Matilde Sprang on: 04/07/2022 01:06 PM   Modules accepted: Orders

## 2022-04-08 ENCOUNTER — Other Ambulatory Visit: Payer: Self-pay

## 2022-04-08 ENCOUNTER — Ambulatory Visit: Payer: Self-pay | Admitting: *Deleted

## 2022-04-08 ENCOUNTER — Other Ambulatory Visit: Payer: Self-pay | Admitting: *Deleted

## 2022-04-08 DIAGNOSIS — I2699 Other pulmonary embolism without acute cor pulmonale: Secondary | ICD-10-CM

## 2022-04-08 DIAGNOSIS — I10 Essential (primary) hypertension: Secondary | ICD-10-CM

## 2022-04-08 MED ORDER — RIVAROXABAN 20 MG PO TABS: 20.0000 mg | ORAL_TABLET | Freq: Every day | ORAL | 0 refills | Status: DC

## 2022-04-08 MED ORDER — AMLODIPINE BESYLATE 10 MG PO TABS: 10.0000 mg | ORAL_TABLET | Freq: Every day | ORAL | 0 refills | Status: DC

## 2022-04-08 NOTE — Telephone Encounter (Signed)
Courtesy refill given. Future visit scheduled for 07/01/22. Patient out of medication.  Requested Prescriptions  Pending Prescriptions Disp Refills  . rivaroxaban (XARELTO) 20 MG TABS tablet 30 tablet 0    Sig: Take 1 tablet (20 mg total) by mouth daily with supper.     Hematology: Anticoagulants - rivaroxaban Failed - 04/08/2022  5:03 PM      Failed - Cr in normal range and within 360 days    Creatinine  Date Value Ref Range Status  01/30/2021 1.68 (H) 0.61 - 1.24 mg/dL Final   Creatinine, Ser  Date Value Ref Range Status  06/15/2021 1.31 (H) 0.61 - 1.24 mg/dL Final         Failed - HCT in normal range and within 360 days    HCT  Date Value Ref Range Status  06/15/2021 20.5 (L) 39.0 - 52.0 % Final   Hematocrit  Date Value Ref Range Status  12/10/2019 44.5 37.5 - 51.0 % Final         Failed - HGB in normal range and within 360 days    Hemoglobin  Date Value Ref Range Status  06/15/2021 6.8 (LL) 13.0 - 17.0 g/dL Final    Comment:    This critical result has verified and been called to [facility] RN by Marella Bile on 11 28 2022 at 0853, and has been read back.  This critical result has verified and been called to B. SEVOY by Marella Bile on 11 28 2022 at 0900, and has been read back.    11/04/2020 12.2 (L) 13.0 - 17.0 g/dL Final  12/10/2019 14.9 13.0 - 17.7 g/dL Final         Passed - ALT in normal range and within 360 days    ALT  Date Value Ref Range Status  06/14/2021 21 0 - 44 U/L Final  01/30/2021 <6 0 - 44 U/L Final         Passed - AST in normal range and within 360 days    AST  Date Value Ref Range Status  06/14/2021 24 15 - 41 U/L Final  01/30/2021 8 (L) 15 - 41 U/L Final         Passed - PLT in normal range and within 360 days    Platelets  Date Value Ref Range Status  06/15/2021 152 150 - 400 K/uL Final  12/10/2019 209 150 - 450 x10E3/uL Final   Platelet Count  Date Value Ref Range Status  11/04/2020 122 (L) 150 - 400 K/uL Final          Passed - eGFR is 15 or above and within 360 days    GFR calc Af Amer  Date Value Ref Range Status  12/10/2019 89 >59 mL/min/1.73 Final    Comment:    **[facility] currently reports eGFR in compliance with the current**   recommendations of the [employer]. [facility] will   update reporting as new guidelines are published from the NKF-ASN   Task force.    GFR, Estimated  Date Value Ref Range Status  06/15/2021 >60 >60 mL/min Final    Comment:    (NOTE) Calculated using the CKD-EPI Creatinine Equation (2021)   01/30/2021 47 (L) >60 mL/min Final    Comment:    (NOTE) Calculated using the CKD-EPI Creatinine Equation (2021)          Passed - Patient is not pregnant      Passed - Valid encounter within last 12 months  Recent Outpatient Visits          10 months ago Hospital discharge follow-up   [place], Deborah B, MD   1 year ago Essential hypertension   [place], Deborah B, MD   1 year ago Essential hypertension   [place], RPH-CPP   1 year ago Essential hypertension   [place], Deborah B, MD   1 year ago Mass of left side of neck   [place], Darrick Penna, MD      Future Appointments            In 2 months Wynetta Emery Dalbert Batman, MD [place]           . amLODipine (NORVASC) 10 MG tablet 30 tablet 0    Sig: Take 1 tablet (10 mg total) by mouth daily.     Cardiovascular: Calcium Channel Blockers 2 Failed - 04/08/2022  5:03 PM      Failed - Last BP in normal range    BP Readings from Last 1 Encounters:  06/15/21 (!) 86/66         Failed - Valid encounter within last 6 months    Recent Outpatient Visits          10 months ago Hospital discharge follow-up   [place],  Deborah B, MD   1 year ago Essential hypertension   [place], Deborah B, MD   1 year ago Essential hypertension   [place], RPH-CPP   1 year ago Essential hypertension   [place], Deborah B, MD   1 year ago Mass of left side of neck   [place], MD      Future Appointments            In 2 months Ladell Pier, MD [place]           Passed - Last Heart Rate in normal range    Pulse Readings from Last 1 Encounters:  06/15/21 (!) 108

## 2022-04-08 NOTE — Telephone Encounter (Addendum)
Courtesy refill . Future appt in 2 months.  Requested Prescriptions  Pending Prescriptions Disp Refills  . rivaroxaban (XARELTO) 20 MG TABS tablet 30 tablet 0    Sig: Take 1 tablet (20 mg total) by mouth daily with supper.     Hematology: Anticoagulants - rivaroxaban Failed - 04/08/2022  4:34 PM      Failed - Cr in normal range and within 360 days    Creatinine  Date Value Ref Range Status  01/30/2021 1.68 (H) 0.61 - 1.24 mg/dL Final   Creatinine, Ser  Date Value Ref Range Status  06/15/2021 1.31 (H) 0.61 - 1.24 mg/dL Final         Failed - HCT in normal range and within 360 days    HCT  Date Value Ref Range Status  06/15/2021 20.5 (L) 39.0 - 52.0 % Final   Hematocrit  Date Value Ref Range Status  12/10/2019 44.5 37.5 - 51.0 % Final         Failed - HGB in normal range and within 360 days    Hemoglobin  Date Value Ref Range Status  06/15/2021 6.8 (LL) 13.0 - 17.0 g/dL Final    Comment:    This critical result has verified and been called to [facility] RN by Marella Bile on 11 28 2022 at 0853, and has been read back.  This critical result has verified and been called to B. SEVOY by Marella Bile on 11 28 2022 at 0900, and has been read back.    11/04/2020 12.2 (L) 13.0 - 17.0 g/dL Final  12/10/2019 14.9 13.0 - 17.7 g/dL Final         Passed - ALT in normal range and within 360 days    ALT  Date Value Ref Range Status  06/14/2021 21 0 - 44 U/L Final  01/30/2021 <6 0 - 44 U/L Final         Passed - AST in normal range and within 360 days    AST  Date Value Ref Range Status  06/14/2021 24 15 - 41 U/L Final  01/30/2021 8 (L) 15 - 41 U/L Final         Passed - PLT in normal range and within 360 days    Platelets  Date Value Ref Range Status  06/15/2021 152 150 - 400 K/uL Final  12/10/2019 209 150 - 450 x10E3/uL Final   Platelet Count  Date Value Ref Range Status  11/04/2020 122 (L) 150 - 400 K/uL Final         Passed - eGFR is 15 or above and within 360  days    GFR calc Af Amer  Date Value Ref Range Status  12/10/2019 89 >59 mL/min/1.73 Final    Comment:    **[facility] currently reports eGFR in compliance with the current**   recommendations of the [employer]. [facility] will   update reporting as new guidelines are published from the NKF-ASN   Task force.    GFR, Estimated  Date Value Ref Range Status  06/15/2021 >60 >60 mL/min Final    Comment:    (NOTE) Calculated using the CKD-EPI Creatinine Equation (2021)   01/30/2021 47 (L) >60 mL/min Final    Comment:    (NOTE) Calculated using the CKD-EPI Creatinine Equation (2021)          Passed - Patient is not pregnant      Passed - Valid encounter within last 12 months    Recent Outpatient Visits  10 months ago Hospital discharge follow-up   [place], MD   1 year ago Essential hypertension   [place], Deborah B, MD   1 year ago Essential hypertension   [place], RPH-CPP   1 year ago Essential hypertension   [place], Deborah B, MD   1 year ago Mass of left side of neck   [place], Darrick Penna, MD      Future Appointments            In 2 months Wynetta Emery Dalbert Batman, MD [place]           . amLODipine (NORVASC) 10 MG tablet 30 tablet 0    Sig: Take 1 tablet (10 mg total) by mouth daily.     Cardiovascular: Calcium Channel Blockers 2 Failed - 04/08/2022  4:34 PM      Failed - Last BP in normal range    BP Readings from Last 1 Encounters:  06/15/21 (!) 86/66         Failed - Valid encounter within last 6 months    Recent Outpatient Visits          10 months ago Hospital discharge follow-up   [place], Deborah B, MD   1 year ago Essential  hypertension   [place], Deborah B, MD   1 year ago Essential hypertension   [place], [place], RPH-CPP   1 year ago Essential hypertension   [place], Deborah B, MD   1 year ago Mass of left side of neck   [place], MD      Future Appointments            In 2 months Ladell Pier, MD [place]           Passed - Last Heart Rate in normal range    Pulse Readings from Last 1 Encounters:  06/15/21 (!) 108

## 2022-04-08 NOTE — Telephone Encounter (Signed)
Will route to provider covering for Dr. Wynetta Emery to approve.

## 2022-04-08 NOTE — Telephone Encounter (Signed)
Pt is concerned about his swelling since he has been known to have blood clots several times / he has been with out his blood thinners for a month and a half and asked if he can get a courtesy refill for XARELTO until his October appt / please advise asap

## 2022-04-08 NOTE — Telephone Encounter (Signed)
Chief Complaint: out of medication xarleto and amlodipine. Hx DVT Symptoms: out of medication xarelto x 1 month 1/2 now due to "cant get anyone to refill". C/o left knee swelling and veins in legs look "more pronounced".  Frequency: last 10/28/2022  Pertinent Negatives: Patient denies chest pain no difficulty breathing. No fever, no swelling in leg only left knee. No severe pain or redness or hot to touch calf area.  Disposition: '[x]'$ ED /'[]'$ Urgent Care (no appt availability in office) / '[]'$ Appointment(In office/virtual)/ '[]'$  [place] [facility]/ '[]'$ Home Care/ '[]'$ Refused Recommended Disposition /'[]'$ [place] [facility]/ '[x]'$  Follow-up with PCP Additional Notes:   Recommended to go to ED to evaluate left leg veins. Patient requesting if he can get medication refilled today . Appt scheduled for 07/01/22. Please advise patient requesting call back regarding medication refills and why he continues to be denied refills when he is making appt with PCP.     Reason for Disposition  [1] Caller has URGENT medicine question about med that PCP or specialist prescribed AND [2] triager unable to answer question  Answer Assessment - Initial Assessment Questions 1. LOCATION: "Where is the swelling located?"  (e.g., left, right, both knees)     Left knee swelling  2. ONSET: "When did the swelling start?" "Does it come and go, or is it there all the time?"     Last Friday  3. SWELLING: "How bad is the swelling?" Or, "How large is it?" (e.g., mild, moderate, severe; size of localized swelling)    - NONE: No joint swelling.   - LOCALIZED: Localized; small area of puffy or swollen skin (e.g., insect bite, skin irritation).   - MILD: Joint looks or feels mildly swollen or puffy.   - MODERATE: Swollen; interferes with normal activities (e.g., work or school); can't move joint normally (bend and straighten completely); may be limping.   - SEVERE: Very swollen; can't move swollen joint at all; limping a lot or unable to  walk.     Swelling in left knee noted affected climbing stairs 4. PAIN: "Is there any pain?" If Yes, ask: "How bad is it?" (Scale 1-10; or mild, moderate, severe)   - NONE (0): no pain.   - MILD (1-3): doesn't interfere with normal activities.    - MODERATE (4-7): interferes with normal activities (e.g., work or school) or awakens from sleep, limping.    - SEVERE (8-10): excruciating pain, unable to do any normal activities, unable to walk.     Limping at times.  5. SETTING: "Has there been any recent work, exercise or other activity that involved that part of the body?"      Na  6. AGGRAVATING FACTORS: "What makes the knee swelling worse?" (e.g., walking, climbing stairs, running)     Climbing stairs  7. ASSOCIATED SYMPTOMS: "Is there any pain or redness?"     Denies  8. OTHER SYMPTOMS: "Do you have any other symptoms?" (e.g., chest pain, difficulty breathing, fever, calf pain)     Left knee pain denies pain in leg. Reports feet numb. Veins in leg look more pronounced 9. PREGNANCY: "Is there any chance you are pregnant?" "When was your last menstrual period?"     na  Answer Assessment - Initial Assessment Questions 1. NAME of MEDICINE: "What medicine(s) are you calling about?"     Xarleto and amlodipine 2. QUESTION: "What is your question?" (e.g., double dose of medicine, side effect)     Can patient get refills.  3. PRESCRIBER: "Who prescribed the  medicine?" Reason: if prescribed by specialist, call should be referred to that group.     PCP 4. SYMPTOMS: "Do you have any symptoms?" If Yes, ask: "What symptoms are you having?"  "How bad are the symptoms (e.g., mild, moderate, severe)     Left knee pain hx DVT. Left leg veins more pronounced 5. PREGNANCY:  "Is there any chance that you are pregnant?" "When was your last menstrual period?"     na  Protocols used: Knee Swelling-A-AH, Medication Question Call-A-AH

## 2022-04-09 MED ORDER — RIVAROXABAN 20 MG PO TABS: 20.0000 mg | ORAL_TABLET | Freq: Every day | ORAL | 0 refills | Status: DC

## 2022-04-09 MED ORDER — AMLODIPINE BESYLATE 10 MG PO TABS: 10.0000 mg | ORAL_TABLET | Freq: Every day | ORAL | 0 refills | Status: DC

## 2022-04-09 NOTE — Telephone Encounter (Signed)
Per epic the medication was refilled by triage nurse but scripts states print. Is refill appropriate?

## 2022-04-09 NOTE — Telephone Encounter (Signed)
He has not been seen by PCP in 10 months and needs an appointment.  1 month supply sent to pharmacy.

## 2022-04-15 NOTE — Telephone Encounter (Signed)
Call placed to patient and VM was left informing patient that refills has been sent.

## 2022-04-21 ENCOUNTER — Telehealth: Payer: Self-pay

## 2022-04-21 NOTE — Telephone Encounter (Signed)
Called spoke w/ pt. Pt informed to schedule appt for medcheck w/ pt for RF's. Pt expressed understanding. Appt scheduled for 10/31'@2'$ :10pm w/ pcp to discuss Rx's via [place]. Pt expressed understanding. ---DD,RMA

## 2022-04-21 NOTE — Telephone Encounter (Signed)
-----   Message from Ladell Pier, MD sent at 04/10/2022 11:08 AM EDT ----- Regarding: Appt Pt has appt with me in December but he needs appt with me prior to that to continue to receive refills on medications.  Please give appt for sometime in October.

## 2022-05-03 ENCOUNTER — Other Ambulatory Visit: Payer: Self-pay | Admitting: Family Medicine

## 2022-05-03 DIAGNOSIS — I10 Essential (primary) hypertension: Secondary | ICD-10-CM

## 2022-05-14 ENCOUNTER — Other Ambulatory Visit: Payer: Self-pay | Admitting: Internal Medicine

## 2022-05-14 ENCOUNTER — Other Ambulatory Visit: Payer: Self-pay | Admitting: Family Medicine

## 2022-05-14 DIAGNOSIS — I2699 Other pulmonary embolism without acute cor pulmonale: Secondary | ICD-10-CM

## 2022-05-14 DIAGNOSIS — I10 Essential (primary) hypertension: Secondary | ICD-10-CM

## 2022-05-18 ENCOUNTER — Ambulatory Visit: Payer: Medicaid Other | Attending: Internal Medicine | Admitting: Internal Medicine

## 2022-05-18 ENCOUNTER — Encounter: Payer: Self-pay | Admitting: Internal Medicine

## 2022-05-18 VITALS — BP 119/81 | HR 74 | Ht 69.0 in | Wt 126.8 lb

## 2022-05-18 DIAGNOSIS — I10 Essential (primary) hypertension: Secondary | ICD-10-CM

## 2022-05-18 DIAGNOSIS — D6859 Other primary thrombophilia: Secondary | ICD-10-CM | POA: Diagnosis not present

## 2022-05-18 DIAGNOSIS — Z85818 Personal history of malignant neoplasm of other sites of lip, oral cavity, and pharynx: Secondary | ICD-10-CM

## 2022-05-18 DIAGNOSIS — D649 Anemia, unspecified: Secondary | ICD-10-CM | POA: Diagnosis not present

## 2022-05-18 DIAGNOSIS — Z1211 Encounter for screening for malignant neoplasm of colon: Secondary | ICD-10-CM | POA: Diagnosis not present

## 2022-05-18 DIAGNOSIS — I2699 Other pulmonary embolism without acute cor pulmonale: Secondary | ICD-10-CM

## 2022-05-18 DIAGNOSIS — R634 Abnormal weight loss: Secondary | ICD-10-CM

## 2022-05-18 DIAGNOSIS — Z23 Encounter for immunization: Secondary | ICD-10-CM

## 2022-05-18 MED ORDER — RIVAROXABAN 20 MG PO TABS: 20.0000 mg | ORAL_TABLET | Freq: Every day | ORAL | 3 refills | Status: DC

## 2022-05-18 MED ORDER — AMLODIPINE BESYLATE 10 MG PO TABS: 10.0000 mg | ORAL_TABLET | Freq: Every day | ORAL | 1 refills | Status: DC

## 2022-05-18 NOTE — Progress Notes (Signed)
Patient ID: GEMAYEL MASCIO, male    DOB: Oct 14, 1960  MRN: [id number]  CC: chronic ds management   Subjective: Mitchell Cooper is a 61 y.o. 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    Tonsillar CA stage III ( Dr. Chryl Heck and Redmond Baseman -did not respond well to chemo and radiation.  Radical resection of the left tonsil with bilateral neck dissection and reconstruction using skin from his left forearm and left thigh)  Patient last seen by me 1 year ago.  History of tonsillar CA: Down 20 pounds since I last saw him a year ago. [facility] 05/2021 with sepsis.  Last saw Onc/ENT specialist 09/2021. Wants to re-est care with oncologist and ENT here locally for convenience.  Had seen Dr. Redmond Baseman and Dr. Chryl Heck before care was transferred to [facility].  He has not been having any issues since last seen at [facility].  Has problems chewing certain solids because all of his back teeth were removed.  Last CBC in the system revealed normocytic anemia with H/H of 8.9/27.2 in December of last year -loss 20 lbs since I saw him 1 yr ago.  He was 147 lbs last Nov.  Reports he loss 10 lbs when he was hosp with sepsis.  Also wgh loss due to not eating the way he would like due to dental issue.  Trying to st up dental appt  HTN: On Norvasc. And took already today.  No device to check BP at home.  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  Reports compliance with Xarelto.  No bruising or bleeding. No blood in stools/urine  HM: Due for flu, shingles and Tdap vaccine.  Also due for COVID booster.  Due for colon cancer screening.  Declines shingles vaccine.  Will take Tdapt in December.   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   Tonsillar mass 08/08/2020   Mass of left side of neck 07/09/2020   Former smoker 04/08/2020   Primary osteoarthritis of both knees 04/08/2020   VTE (venous thromboembolism) 03/19/2020   RVF (right ventricular failure) ([place]) 12/07/2019   Recurrent pulmonary emboli ([place]) 11/03/2019   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 30 tablet 0   acetaminophen (TYLENOL) 500 MG tablet Take 1,000 mg by mouth every 8 (eight) hours as needed for fever. (Patient not taking: Reported on 05/18/2022)     lidocaine (XYLOCAINE)  2 % solution Patient: Mix 1part 2% viscous lidocaine, 1part H20. Swish & swallow 64m of diluted mixture, 242m before meals and at bedtime, up to QID (Patient not taking: Reported on 06/15/2021) 200 mL 4   Lidocaine 2 % GEL Apply to painful skin as directed prn. (Patient not taking: Reported on 06/15/2021) 28.33 g 3   lidocaine-prilocaine (EMLA) cream Apply to affected area once (Patient not taking: Reported on 06/15/2021) 30 g 3   ondansetron (ZOFRAN) 8 MG tablet Take 1 tablet (8 mg total) by mouth 2 (two) times daily as needed. Start on the third day  after cisplatin chemotherapy. (Patient not taking: Reported on 06/15/2021) 30 tablet 1   prochlorperazine (COMPAZINE) 10 MG tablet Take 1 tablet (10 mg total) by mouth every 6 (six) hours as needed (Nausea or vomiting). (Patient not taking: Reported on 06/15/2021) 30 tablet 1   No current facility-administered medications on file prior to visit.    No Known Allergies  Social History   Socioeconomic History   Marital status: Married    Spouse name: Not on file   Number of children: Not on file   Years of education: Not on file   Highest education level: Not on file  Occupational History   Occupation: coArchitectTobacco Use   Smoking status: Some Days    Types: Cigarettes    Last attempt to quit: 11/21/2016    Years since quitting: 5.4   Smokeless tobacco: Never  Vaping Use   Vaping Use: Never used  Substance and Sexual Activity   Alcohol use: Yes    Alcohol/week: 6.0 standard drinks of alcohol    Types: 6 Cans of beer per week   Drug use: No   Sexual activity: Not Currently  Other Topics Concern   Not on file  Social History Narrative   Not on file   Social Determinants of Health   Financial Resource Strain: Low Risk  (09/17/2020)   Overall Financial Resource Strain (CARDIA)    Difficulty of Paying Living Expenses: Not very hard  Food Insecurity: No Food Insecurity (09/17/2020)   Hunger Vital Sign    Worried About Running Out of Food in the Last Year: Never true    Ran Out of Food in the Last Year: Never true  Transportation Needs: No Transportation Needs (09/17/2020)   PRAPARE - TrHydrologistMedical): No    Lack of Transportation (Non-Medical): No  Physical Activity: Inactive (11/26/2019)   Exercise Vital Sign    Days of Exercise per Week: 0 days    Minutes of Exercise per Session: 0 min  Stress: No Stress Concern Present (09/17/2020)   [place]  Feeling of Stress : Only  a little  Social Connections: Socially Integrated (09/17/2020)   Social Connection and Isolation Panel [NHANES]    Frequency of Communication with Friends and Family: More than three times a week    Frequency of Social Gatherings with Friends and Family: More than three times a week    Attends Religious Services: More than 4 times per year    Active Member of [employer] Organizations: Yes    Attends ClMusic therapistMore than 4 times per year    Marital Status: Married  InHuman resources officeriolence: Not on file    Family History  Problem Relation Age of Onset   CAD Mother 6948  Past Surgical History:  Procedure Laterality Date  IR GASTROSTOMY TUBE MOD SED  09/11/2020   IR IMAGING GUIDED PORT INSERTION  09/11/2020   KNEE SURGERY  approx. 1984   reconstructive surgery of left knee; arthroscopic procedure about [place] WITH ALVEOLOPLASTY N/A 09/25/2020   Procedure: MULTIPLE EXTRACTIONS WITH ALVEOLOPLASTY;  Surgeon: Charlaine Dalton, DMD;  Location: [place];  Service: Dentistry;  Laterality: N/A;   PERIPHERAL VASCULAR CATHETERIZATION Left 06/16/2016   Procedure: Lower Extremity Venography- Venus Thrombolysis;  Surgeon: Serafina Mitchell, MD;  Location: [place] [facility];  Service: Cardiovascular;  Laterality: Left;    ROS: Review of Systems Negative except as stated above  PHYSICAL EXAM: BP 119/81 (BP Location: Left Arm, Patient Position: Sitting, Cuff Size: Normal)   Pulse 74   Ht '5\' 9"'$  (1.753 m)   Wt 126 lb 12.8 oz (57.5 kg)   SpO2 100%   BMI 18.73 kg/m   Wt Readings from Last 3 Encounters:  05/18/22 126 lb 12.8 oz (57.5 kg)  06/14/21 146 lb 9.7 oz (66.5 kg)  05/28/21 146 lb 6.4 oz (66.4 kg)    Physical Exam   General appearance - alert, older African-American male who appears underweight for height  and in no distress Mental status - normal mood, behavior, speech, dress, motor activity, and thought processes Mouth -grayish dark discoloration  left side of the throat patient states it has been like this since his surgery neck - supple, no significant adenopathy Chest - clear to auscultation, no wheezes, rales or rhonchi, symmetric air entry Heart - normal rate, regular rhythm, normal S1, S2, no murmurs, rubs, clicks or gallops Extremities - peripheral pulses normal, no pedal edema, no clubbing or cyanosis     Latest Ref Rng & Units 06/15/2021    8:10 AM 06/14/2021    7:55 PM 01/30/2021    2:06 PM  CMP  Glucose 70 - 99 mg/dL 104  102  108   BUN 6 - 20 mg/dL 24  35  24   Creatinine 0.61 - 1.24 mg/dL 1.31  1.34  1.68   Sodium 135 - 145 mmol/L 136  136  138   Potassium 3.5 - 5.1 mmol/L 3.7  3.7  4.0   Chloride 98 - 111 mmol/L 103  99  104   CO2 22 - 32 mmol/L '26  29  27   '$ Calcium 8.9 - 10.3 mg/dL 8.3  9.1  9.4   Total Protein 6.5 - 8.1 g/dL  7.4  7.4   Total Bilirubin 0.3 - 1.2 mg/dL  0.3  0.2   Alkaline Phos 38 - 126 U/L  45  56   AST 15 - 41 U/L  24  8   ALT 0 - 44 U/L  21  <6    Lipid Panel  No results found for: "CHOL", "TRIG", "HDL", "CHOLHDL", "VLDL", "LDLCALC", "LDLDIRECT"  CBC    Component Value Date/Time   WBC 11.4 (H) 06/15/2021 0810   RBC 2.53 (L) 06/15/2021 0810   HGB 6.8 (LL) 06/15/2021 0810   HGB 12.2 (L) 11/04/2020 1357   HGB 14.9 12/10/2019 1348   HCT 20.5 (L) 06/15/2021 0810   HCT 44.5 12/10/2019 1348   PLT 152 06/15/2021 0810   PLT 122 (L) 11/04/2020 1357   PLT 209 12/10/2019 1348   MCV 81.0 06/15/2021 0810   MCV 80 12/10/2019 1348   MCH 26.9 06/15/2021 0810   MCHC 33.2 06/15/2021 0810   RDW 16.8 (H) 06/15/2021 0810   RDW 15.9 (H) 12/10/2019 1348  LYMPHSABS 0.1 (L) 06/15/2021 0810   MONOABS 0.2 06/15/2021 0810   EOSABS 0.0 06/15/2021 0810   BASOSABS 0.0 06/15/2021 0810    ASSESSMENT AND PLAN: 1. Essential hypertension At goal.  Continue amlodipine and low-salt diet - CBC - Comprehensive metabolic panel - amLODipine (NORVASC) 10 MG tablet; Take 1 tablet (10 mg total) by mouth daily.   Dispense: 90 tablet; Refill: 1  2. Thrombophilia ([place]) On Xarelto lifelong.  Tolerating medication without any bruising or bleeding - CBC - Comprehensive metabolic panel - rivaroxaban (XARELTO) 20 MG TABS tablet; Take 1 tablet (20 mg total) by mouth daily with supper.  Dispense: 30 tablet; Refill: 3  3. Recurrent pulmonary emboli (HCC) - rivaroxaban (XARELTO) 20 MG TABS tablet; Take 1 tablet (20 mg total) by mouth daily with supper.  Dispense: 30 tablet; Refill: 3  4. Weight loss, unintentional Likely related to history of tonsillar cancer and dental issue.  Advised him to call his insurance and find out the names of a few dental practices that are in their network.  He should call and make an appointment.  Recommend drinking boost or Ensure to supplement meals.  5. Normocytic anemia Recheck CBC today to see if this has improved compared to when last checked in December of last year.  6. History of cancer tonsil We will get him back in with Dr. Redmond Baseman and Dr. Chryl Heck for f/u - Ambulatory referral to Hematology / Oncology - Ambulatory referral to ENT  7. Screening for colon cancer - Ambulatory referral to Gastroenterology  8. Need for immunization against influenza - Flu Vaccine QUAD 21moIM (Fluarix, Fluzone & Alfiuria Quad PF)    Patient was given the opportunity to ask questions.  Patient verbalized understanding of the plan and was able to repeat key elements of the plan.   This documentation was completed using DRadio producer  Any transcriptional errors are unintentional.  No orders of the defined types were placed in this encounter.    Requested Prescriptions    No prescriptions requested or ordered in this encounter    No follow-ups on file.  DKarle Plumber MD, FACP

## 2022-05-19 ENCOUNTER — Encounter: Payer: Self-pay | Admitting: Hematology and Oncology

## 2022-05-19 LAB — COMPREHENSIVE METABOLIC PANEL
ALT: 16 IU/L (ref 0–44)
AST: 23 IU/L (ref 0–40)
Albumin/Globulin Ratio: 1.2 (ref 1.2–2.2)
Albumin: 4.2 g/dL (ref 3.8–4.9)
Alkaline Phosphatase: 92 IU/L (ref 44–121)
BUN/Creatinine Ratio: 12 (ref 9–20)
BUN: 15 mg/dL (ref 6–24)
Bilirubin Total: 0.3 mg/dL (ref 0.0–1.2)
CO2: 26 mmol/L (ref 20–29)
Calcium: 9.5 mg/dL (ref 8.7–10.2)
Chloride: 103 mmol/L (ref 96–106)
Creatinine, Ser: 1.3 mg/dL — ABNORMAL HIGH (ref 0.76–1.27)
Globulin, Total: 3.4 g/dL (ref 1.5–4.5)
Glucose: 97 mg/dL (ref 70–99)
Potassium: 4.2 mmol/L (ref 3.5–5.2)
Sodium: 142 mmol/L (ref 134–144)
Total Protein: 7.6 g/dL (ref 6.0–8.5)
eGFR: 63 mL/min/{1.73_m2} (ref >59)

## 2022-05-19 LAB — CBC
Hematocrit: 43.7 % (ref 37.5–51.0)
Hemoglobin: 15.1 g/dL (ref 13.0–17.7)
MCH: 28.9 pg (ref 26.6–33.0)
MCHC: 34.6 g/dL (ref 31.5–35.7)
MCV: 84 fL (ref 79–97)
Platelets: 214 10*3/uL (ref 150–450)
RBC: 5.23 x10E6/uL (ref 4.14–5.80)
RDW: 13.9 % (ref 11.6–15.4)
WBC: 5.4 10*3/uL (ref 3.4–10.8)

## 2022-07-01 ENCOUNTER — Ambulatory Visit: Payer: Medicaid Other | Admitting: Internal Medicine

## 2022-07-04 ENCOUNTER — Other Ambulatory Visit: Payer: Self-pay

## 2022-08-31 ENCOUNTER — Encounter: Payer: Self-pay | Admitting: Internal Medicine

## 2022-08-31 ENCOUNTER — Ambulatory Visit: Payer: Medicaid Other | Attending: Internal Medicine | Admitting: Internal Medicine

## 2022-08-31 VITALS — BP 104/68 | HR 70 | Temp 97.9°F | Ht 69.0 in | Wt 133.0 lb

## 2022-08-31 DIAGNOSIS — I2699 Other pulmonary embolism without acute cor pulmonale: Secondary | ICD-10-CM | POA: Diagnosis not present

## 2022-08-31 DIAGNOSIS — D6859 Other primary thrombophilia: Secondary | ICD-10-CM | POA: Diagnosis not present

## 2022-08-31 DIAGNOSIS — E46 Unspecified protein-calorie malnutrition: Secondary | ICD-10-CM

## 2022-08-31 DIAGNOSIS — Z1211 Encounter for screening for malignant neoplasm of colon: Secondary | ICD-10-CM

## 2022-08-31 DIAGNOSIS — Z85818 Personal history of malignant neoplasm of other sites of lip, oral cavity, and pharynx: Secondary | ICD-10-CM

## 2022-08-31 DIAGNOSIS — I10 Essential (primary) hypertension: Secondary | ICD-10-CM | POA: Diagnosis not present

## 2022-08-31 MED ORDER — RIVAROXABAN 20 MG PO TABS: 20.0000 mg | ORAL_TABLET | Freq: Every day | ORAL | 4 refills | Status: DC

## 2022-08-31 NOTE — Patient Instructions (Addendum)
Please call [facility] [address], [place] [address] to schedule colonoscopy. PH# [phone]  Please call Dr. Redmond Baseman to schedule appointment:  [facility] ph. # [phone]  address [address].

## 2022-08-31 NOTE — Progress Notes (Unsigned)
Patient ID: Mitchell Cooper, male    DOB: 16-Aug-1960  MRN: [id number]  CC: Hypertension (HTN f/u. Med refills. Neoma Laming received flu vax. )   Subjective: Mitchell Cooper is a 62 y.o. 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    Tonsillar CA stage III ( Dr. Chryl Heck and Redmond Baseman -did not respond well to chemo and radiation.  Radical resection of the left tonsil with bilateral neck dissection and reconstruction using skin from his left forearm and left thigh)   Hx of Tonsil Cancer: On last visit, patient requested referral to ENT and oncology here locally in [facility] for follow-up.  Was previously receiving his care through [facility].  Viral was submitted. He was contacted and messages left. He has not called back as yet.  States that he had a lot going on. Swallowing ok No pain Gained 7 lbs since last visit.  Good appetite but not able to eat certain foods like meat because of dental issue.  Needs partials. Plans to go to SS to get dental plan added to his Medicaid.  HTN: On Norvasc 10 mg daily and took already today.  No chest pains or shortness of breath.  No swelling in the legs.  Hx current DVT/PE:  taking Xarelto.  Denies any bruising or bleeding.  HM:  plans to get shingles vaccine and COVID booster today [facility].  Has appt this afternoon.  Referred to GI on last visit for c-scope for screening  They did call; pt has not return call for same reason he did not respond to ENT/Onc calls.  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[place]) 09/03/2020   Tonsillar mass 08/08/2020   Mass of left side of neck 07/09/2020   Former smoker 04/08/2020   Primary osteoarthritis of both knees 04/08/2020   VTE (venous thromboembolism) 03/19/2020   RVF (right ventricular failure) ([place]) 12/07/2019   Recurrent pulmonary emboli ([place]) 11/03/2019   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 90 tablet 1   rivaroxaban (XARELTO) 20 MG TABS tablet Take 1 tablet (20 mg total) by mouth daily with supper. 30 tablet 3   prochlorperazine (COMPAZINE) 10 MG tablet Take 1 tablet (10 mg total) by mouth every 6 (six) hours as needed (Nausea or vomiting). (Patient not taking: Reported on 06/15/2021) 30 tablet 1   No current facility-administered medications on file prior to visit.    No Known Allergies  Social History   Socioeconomic History   Marital status: Married  Spouse name: Not on file   Number of children: Not on file   Years of education: Not on file   Highest education level: Not on file  Occupational History   Occupation: Architect  Tobacco Use   Smoking status: Some Days    Types: Cigarettes    Last attempt to quit: 11/21/2016    Years since quitting: 5.7   Smokeless tobacco: Never  Vaping Use   Vaping Use: Never used  Substance and Sexual Activity   Alcohol use: Yes    Alcohol/week: 6.0 standard drinks of alcohol    Types: 6 Cans of beer per  week   Drug use: No   Sexual activity: Not Currently  Other Topics Concern   Not on file  Social History Narrative   Not on file   Social Determinants of Health   Financial Resource Strain: Low Risk  (09/17/2020)   Overall Financial Resource Strain (CARDIA)    Difficulty of Paying Living Expenses: Not very hard  Food Insecurity: No Food Insecurity (09/17/2020)   Hunger Vital Sign    Worried About Running Out of Food in the Last Year: Never true    Ran Out of Food in the Last Year: Never true  Transportation Needs: No Transportation Needs (09/17/2020)   PRAPARE - Hydrologist (Medical): No    Lack of Transportation (Non-Medical): No  Physical Activity: Inactive (11/26/2019)   Exercise Vital Sign    Days of Exercise per Week: 0 days    Minutes of Exercise per Session: 0 min  Stress: No Stress Concern Present (09/17/2020)   [employer]    Feeling of Stress : Only a little  Social Connections: Socially Integrated (09/17/2020)   Social Connection and Isolation Panel [NHANES]    Frequency of Communication with Friends and Family: More than three times a week    Frequency of Social Gatherings with Friends and Family: More than three times a week    Attends Religious Services: More than 4 times per year    Active Member of Clubs or Organizations: Yes    Attends Music therapist: More than 4 times per year    Marital Status: Married  Human resources officer Violence: Not on file    Family History  Problem Relation Age of Onset   CAD Mother 47    Past Surgical History:  Procedure Laterality Date   IR GASTROSTOMY TUBE MOD SED  09/11/2020   IR IMAGING GUIDED PORT INSERTION  09/11/2020   KNEE SURGERY  approx. 1984   reconstructive surgery of left knee; arthroscopic procedure about [place] WITH ALVEOLOPLASTY N/A 09/25/2020   Procedure: MULTIPLE EXTRACTIONS WITH ALVEOLOPLASTY;   Surgeon: Charlaine Dalton, DMD;  Location: [place];  Service: Dentistry;  Laterality: N/A;   PERIPHERAL VASCULAR CATHETERIZATION Left 06/16/2016   Procedure: Lower Extremity Venography- Venus Thrombolysis;  Surgeon: Serafina Mitchell, MD;  Location: [place];  Service: Cardiovascular;  Laterality: Left;    ROS: Review of Systems Negative except as stated above  PHYSICAL EXAM: BP 104/68 (BP Location: Left Arm, Patient Position: Sitting, Cuff Size: Normal)   Pulse 70   Temp 97.9 F (36.6 C) (Oral)   Ht 5' 9"$  (1.753 m)   Wt 133 lb (60.3 kg)   SpO2 100%   BMI 19.64 kg/m   Wt Readings from Last 3 Encounters:  08/31/22 133 lb (60.3 kg)  05/18/22 126 lb  12.8 oz (57.5 kg)  06/14/21 146 lb 9.7 oz (66.5 kg)    Physical Exam   General appearance -older African-American male who appears underweight for height with some temporal wasting. Mental status - normal mood, behavior, speech, dress, motor activity, and thought processes Neck - supple, no significant adenopathy Chest - clear to auscultation, no wheezes, rales or rhonchi, symmetric air entry Heart - normal rate, regular rhythm, normal S1, S2, no murmurs, rubs, clicks or gallops Extremities -no lower extremity edema     Latest Ref Rng & Units 05/18/2022    3:35 PM 06/15/2021    8:10 AM 06/14/2021    7:55 PM  CMP  Glucose 70 - 99 mg/dL 97  104  102   BUN 6 - 24 mg/dL 15  24  35   Creatinine 0.76 - 1.27 mg/dL 1.30  1.31  1.34   Sodium 134 - 144 mmol/L 142  136  136   Potassium 3.5 - 5.2 mmol/L 4.2  3.7  3.7   Chloride 96 - 106 mmol/L 103  103  99   CO2 20 - 29 mmol/L 26  26  29   $ Calcium 8.7 - 10.2 mg/dL 9.5  8.3  9.1   Total Protein 6.0 - 8.5 g/dL 7.6   7.4   Total Bilirubin 0.0 - 1.2 mg/dL 0.3   0.3   Alkaline Phos 44 - 121 IU/L 92   45   AST 0 - 40 IU/L 23   24   ALT 0 - 44 IU/L 16   21    Lipid Panel  No results found for: "CHOL", "TRIG", "HDL", "CHOLHDL", "VLDL", "LDLCALC", "LDLDIRECT"  CBC    Component  Value Date/Time   WBC 5.4 05/18/2022 1535   WBC 11.4 (H) 06/15/2021 0810   RBC 5.23 05/18/2022 1535   RBC 2.53 (L) 06/15/2021 0810   HGB 15.1 05/18/2022 1535   HCT 43.7 05/18/2022 1535   PLT 214 05/18/2022 1535   MCV 84 05/18/2022 1535   MCH 28.9 05/18/2022 1535   MCH 26.9 06/15/2021 0810   MCHC 34.6 05/18/2022 1535   MCHC 33.2 06/15/2021 0810   RDW 13.9 05/18/2022 1535   LYMPHSABS 0.1 (L) 06/15/2021 0810   MONOABS 0.2 06/15/2021 0810   EOSABS 0.0 06/15/2021 0810   BASOSABS 0.0 06/15/2021 0810    ASSESSMENT AND PLAN: 1. Essential hypertension At goal. Continue Norvasc  2. Recurrent pulmonary emboli (HCC) Continue Xarelto.   CBC nl on last visit 4 mths ago.  No bruising/bleeding reported - rivaroxaban (XARELTO) 20 MG TABS tablet; Take 1 tablet (20 mg total) by mouth daily with supper.  Dispense: 30 tablet; Refill: 4  3. Thrombophilia ([place]) With hx of recurrent clots - rivaroxaban (XARELTO) 20 MG TABS tablet; Take 1 tablet (20 mg total) by mouth daily with supper.  Dispense: 30 tablet; Refill: 4  4. Malnutrition, unspecified type ([place]) Based on appearance.  However pt reports good appetite and has gained some wgh since last visit.  Encourage him to continue with good appetite and pursue getting dental coverage so he can get his partials  5. History of cancer tonsil Patient states he will call Dr. Redmond Baseman office to schedule his follow-up.  6. Screening for colon cancer Phone number given for him to call [facility] to schedule his colonoscopy when he is ready.     Patient was given the opportunity to ask questions.  Patient verbalized understanding of the plan and was able to repeat key elements of  the plan.   This documentation was completed using Radio producer.  Any transcriptional errors are unintentional.  No orders of the defined types were placed in this encounter.    Requested Prescriptions    No prescriptions requested or  ordered in this encounter    No follow-ups on file.  Karle Plumber, MD, FACP

## 2022-09-01 ENCOUNTER — Other Ambulatory Visit: Payer: Self-pay

## 2022-09-01 DIAGNOSIS — D6859 Other primary thrombophilia: Secondary | ICD-10-CM | POA: Insufficient documentation

## 2022-09-01 DIAGNOSIS — E46 Unspecified protein-calorie malnutrition: Secondary | ICD-10-CM | POA: Insufficient documentation

## 2022-09-07 ENCOUNTER — Ambulatory Visit: Payer: Medicaid Other | Admitting: Internal Medicine

## 2022-10-22 ENCOUNTER — Other Ambulatory Visit: Payer: Self-pay | Admitting: Internal Medicine

## 2022-10-22 DIAGNOSIS — I10 Essential (primary) hypertension: Secondary | ICD-10-CM

## 2022-10-22 NOTE — Telephone Encounter (Signed)
Requested Prescriptions  Pending Prescriptions Disp Refills   amLODipine (NORVASC) 10 MG tablet [Pharmacy Med Name: AMLODIPINE BESYLATE 10 MG TAB] 90 tablet 0    Sig: TAKE 1 TABLET BY MOUTH EVERY DAY     Cardiovascular: Calcium Channel Blockers 2 Passed - 10/22/2022  3:12 PM      Passed - Last BP in normal range    BP Readings from Last 1 Encounters:  08/31/22 104/68         Passed - Last Heart Rate in normal range    Pulse Readings from Last 1 Encounters:  08/31/22 70         Passed - Valid encounter within last 6 months    Recent Outpatient Visits           1 month ago Essential hypertension   [facility] Marcine Matar, MD   5 months ago Essential hypertension   [facility] Marcine Matar, MD   1 year ago Hospital discharge follow-up   [facility] Marcine Matar, MD   1 year ago Essential hypertension   [facility] Marcine Matar, MD   2 years ago Essential hypertension    [facility] Drucilla Chalet, RPH-CPP       Future Appointments             In 2 months Laural Benes, Binnie Rail, MD [facility]

## 2022-12-29 ENCOUNTER — Ambulatory Visit: Payer: Medicaid Other | Admitting: Internal Medicine

## 2022-12-30 ENCOUNTER — Ambulatory Visit: Payer: Medicaid Other | Admitting: Internal Medicine

## 2023-02-11 DIAGNOSIS — M25561 Pain in right knee: Secondary | ICD-10-CM | POA: Diagnosis not present

## 2023-02-15 ENCOUNTER — Other Ambulatory Visit: Payer: Self-pay | Admitting: Internal Medicine

## 2023-02-15 DIAGNOSIS — I10 Essential (primary) hypertension: Secondary | ICD-10-CM

## 2023-05-03 ENCOUNTER — Other Ambulatory Visit: Payer: Self-pay | Admitting: Internal Medicine

## 2023-05-03 DIAGNOSIS — I10 Essential (primary) hypertension: Secondary | ICD-10-CM

## 2023-05-06 ENCOUNTER — Other Ambulatory Visit: Payer: Self-pay | Admitting: Internal Medicine

## 2023-05-06 DIAGNOSIS — D6859 Other primary thrombophilia: Secondary | ICD-10-CM

## 2023-05-06 DIAGNOSIS — I2699 Other pulmonary embolism without acute cor pulmonale: Secondary | ICD-10-CM

## 2023-05-29 ENCOUNTER — Other Ambulatory Visit: Payer: Self-pay | Admitting: Internal Medicine

## 2023-05-29 DIAGNOSIS — I10 Essential (primary) hypertension: Secondary | ICD-10-CM

## 2023-05-30 NOTE — Telephone Encounter (Signed)
Requested medications are due for refill today.  yes  Requested medications are on the active medications list.  yes  Last refill. 05/03/2023 #30 0 rf  Future visit scheduled.   no  Notes to clinic.  Pt is more than 3 months overdue for an ov. Courtesy refill already given.    Requested Prescriptions  Pending Prescriptions Disp Refills   amLODipine (NORVASC) 10 MG tablet [Pharmacy Med Name: AMLODIPINE BESYLATE 10 MG TAB] 90 tablet 1    Sig: Take 1 tablet (10 mg total) by mouth daily. Please make appointment with Dr. Laural Benes.     Cardiovascular: Calcium Channel Blockers 2 Failed - 05/29/2023  8:30 AM      Failed - Valid encounter within last 6 months    Recent Outpatient Visits           9 months ago Essential hypertension   [place] [facility] Dept Of [place]. [facility] Marcine Matar, MD   1 year ago Essential hypertension   [place] [facility] [place] - A Dept Of [place]. [facility] Marcine Matar, MD   2 years ago Hospital discharge follow-up   [place] [facility] - A Dept Of [place]. [facility] Marcine Matar, MD   2 years ago Essential hypertension   [place] [facility] [place] - A Dept Of [place]. [facility] Marcine Matar, MD   2 years ago Essential hypertension   [place] [facility] [place] - A Dept Of . [facility] [place], [place] L, RPH-CPP              Passed - Last BP in normal range    BP Readings from Last 1 Encounters:  08/31/22 104/68         Passed - Last Heart Rate in normal range    Pulse Readings from Last 1 Encounters:  08/31/22 70

## 2023-06-06 ENCOUNTER — Other Ambulatory Visit: Payer: Self-pay | Admitting: Internal Medicine

## 2023-06-06 DIAGNOSIS — I2699 Other pulmonary embolism without acute cor pulmonale: Secondary | ICD-10-CM

## 2023-06-06 DIAGNOSIS — I10 Essential (primary) hypertension: Secondary | ICD-10-CM

## 2023-06-06 DIAGNOSIS — D6859 Other primary thrombophilia: Secondary | ICD-10-CM

## 2023-06-07 NOTE — Telephone Encounter (Signed)
Requested medication (s) are due for refill today - yes  Requested medication (s) are on the active medication list -yes  Future visit scheduled -no  Last refill: 05/06/23 #30  Notes to clinic: both request have notes- no appointment has ben scheduled- sent for review   Requested Prescriptions  Pending Prescriptions Disp Refills   amLODipine (NORVASC) 10 MG tablet [Pharmacy Med Name: AMLODIPINE BESYLATE 10 MG TAB] 30 tablet 0    Sig: Take 1 tablet (10 mg total) by mouth daily. Please make appointment with Dr. Laural Benes.     Cardiovascular: Calcium Channel Blockers 2 Failed - 06/06/2023 12:52 PM      Failed - Valid encounter within last 6 months    Recent Outpatient Visits           9 months ago Essential hypertension   [place] [facility] [place] - A Dept Of [place]. [facility] Marcine Matar, MD   1 year ago Essential hypertension   [place] [facility] - A Dept Of [place]. [facility] Marcine Matar, MD   2 years ago Hospital discharge follow-up   [place] [facility] - A Dept Of [place]. [facility] Marcine Matar, MD   2 years ago Essential hypertension   [place] [facility] - A Dept Of [place]. [facility] Marcine Matar, MD   2 years ago Essential hypertension    [facility] [place] - A Dept Of [place]. [facility] [place], [place] L, RPH-CPP              Passed - Last BP in normal range    BP Readings from Last 1 Encounters:  08/31/22 104/68         Passed - Last Heart Rate in normal range    Pulse Readings from Last 1 Encounters:  08/31/22 70          XARELTO 20 MG TABS tablet [Pharmacy Med Name: XARELTO 20 MG TABLET] 30 tablet 0    Sig: TAKE 1 TABLET BY MOUTH DAILY WITH SUPPER.     Hematology: Anticoagulants - rivaroxaban Failed - 06/06/2023 12:52 PM      Failed - ALT in normal range and within 360 days    ALT  Date Value Ref Range  Status  05/18/2022 16 0 - 44 IU/L Final  01/30/2021 <6 0 - 44 U/L Final         Failed - AST in normal range and within 360 days    AST  Date Value Ref Range Status  05/18/2022 23 0 - 40 IU/L Final  01/30/2021 8 (L) 15 - 41 U/L Final         Failed - Cr in normal range and within 360 days    Creatinine  Date Value Ref Range Status  01/30/2021 1.68 (H) 0.61 - 1.24 mg/dL Final   Creatinine, Ser  Date Value Ref Range Status  05/18/2022 1.30 (H) 0.76 - 1.27 mg/dL Final         Failed - HCT in normal range and within 360 days    Hematocrit  Date Value Ref Range Status  05/18/2022 43.7 37.5 - 51.0 % Final         Failed - HGB in normal range and within 360 days    Hemoglobin  Date Value Ref Range Status  05/18/2022 15.1 13.0 - 17.7 g/dL Final  Failed - PLT in normal range and within 360 days    Platelets  Date Value Ref Range Status  05/18/2022 214 150 - 450 x10E3/uL Final         Failed - eGFR is 15 or above and within 360 days    GFR calc Af Amer  Date Value Ref Range Status  12/10/2019 89 >59 mL/min/1.73 Final    Comment:    **[facility] currently reports eGFR in compliance with the current**   recommendations of the [employer]. [facility] will   update reporting as new guidelines are published from the NKF-ASN   Task force.    GFR, Estimated  Date Value Ref Range Status  06/15/2021 >60 >60 mL/min Final    Comment:    (NOTE) Calculated using the CKD-EPI Creatinine Equation (2021)   01/30/2021 47 (L) >60 mL/min Final    Comment:    (NOTE) Calculated using the CKD-EPI Creatinine Equation (2021)    eGFR  Date Value Ref Range Status  05/18/2022 63 >59 mL/min/1.73 Final         Passed - Patient is not pregnant      Passed - Valid encounter within last 12 months    Recent Outpatient Visits           9 months ago Essential hypertension   [place] [facility] [place] - A Dept Of [place]. [facility] Marcine Matar, MD    1 year ago Essential hypertension   [place] [facility] [place] - A Dept Of [place]. [facility] Marcine Matar, MD   2 years ago Hospital discharge follow-up   [place] [facility] - A Dept Of [place]. [facility] Marcine Matar, MD   2 years ago Essential hypertension   [place] [facility] [place] - A Dept Of [place]. [facility] Marcine Matar, MD   2 years ago Essential hypertension   [place] [facility] [place] - A Dept Of [place]. [facility] Drucilla Chalet, RPH-CPP                 Requested Prescriptions  Pending Prescriptions Disp Refills   amLODipine (NORVASC) 10 MG tablet [Pharmacy Med Name: AMLODIPINE BESYLATE 10 MG TAB] 30 tablet 0    Sig: Take 1 tablet (10 mg total) by mouth daily. Please make appointment with Dr. Laural Benes.     Cardiovascular: Calcium Channel Blockers 2 Failed - 06/06/2023 12:52 PM      Failed - Valid encounter within last 6 months    Recent Outpatient Visits           9 months ago Essential hypertension   [place] [facility] [place] - A Dept Of [place]. [facility] Marcine Matar, MD   1 year ago Essential hypertension   [place] [facility] [place] - A Dept Of [place]. [facility] Marcine Matar, MD   2 years ago Hospital discharge follow-up   [place] [facility] - A Dept Of [place]. [facility] Marcine Matar, MD   2 years ago Essential hypertension   [place] [facility] [place] - A Dept Of [place]. [facility] Marcine Matar, MD   2 years ago Essential hypertension   [place] [facility] [place] - A Dept Of [place]. [facility] Drucilla Chalet, RPH-CPP  Passed - Last BP in normal range    BP Readings from Last 1 Encounters:  08/31/22 104/68         Passed - Last Heart Rate in normal range    Pulse Readings from Last 1 Encounters:   08/31/22 70          XARELTO 20 MG TABS tablet [Pharmacy Med Name: XARELTO 20 MG TABLET] 30 tablet 0    Sig: TAKE 1 TABLET BY MOUTH DAILY WITH SUPPER.     Hematology: Anticoagulants - rivaroxaban Failed - 06/06/2023 12:52 PM      Failed - ALT in normal range and within 360 days    ALT  Date Value Ref Range Status  05/18/2022 16 0 - 44 IU/L Final  01/30/2021 <6 0 - 44 U/L Final         Failed - AST in normal range and within 360 days    AST  Date Value Ref Range Status  05/18/2022 23 0 - 40 IU/L Final  01/30/2021 8 (L) 15 - 41 U/L Final         Failed - Cr in normal range and within 360 days    Creatinine  Date Value Ref Range Status  01/30/2021 1.68 (H) 0.61 - 1.24 mg/dL Final   Creatinine, Ser  Date Value Ref Range Status  05/18/2022 1.30 (H) 0.76 - 1.27 mg/dL Final         Failed - HCT in normal range and within 360 days    Hematocrit  Date Value Ref Range Status  05/18/2022 43.7 37.5 - 51.0 % Final         Failed - HGB in normal range and within 360 days    Hemoglobin  Date Value Ref Range Status  05/18/2022 15.1 13.0 - 17.7 g/dL Final         Failed - PLT in normal range and within 360 days    Platelets  Date Value Ref Range Status  05/18/2022 214 150 - 450 x10E3/uL Final         Failed - eGFR is 15 or above and within 360 days    GFR calc Af Amer  Date Value Ref Range Status  12/10/2019 89 >59 mL/min/1.73 Final    Comment:    **[facility] currently reports eGFR in compliance with the current**   recommendations of the [employer]. [facility] will   update reporting as new guidelines are published from the NKF-ASN   Task force.    GFR, Estimated  Date Value Ref Range Status  06/15/2021 >60 >60 mL/min Final    Comment:    (NOTE) Calculated using the CKD-EPI Creatinine Equation (2021)   01/30/2021 47 (L) >60 mL/min Final    Comment:    (NOTE) Calculated using the CKD-EPI Creatinine Equation (2021)    eGFR  Date Value Ref Range  Status  05/18/2022 63 >59 mL/min/1.73 Final         Passed - Patient is not pregnant      Passed - Valid encounter within last 12 months    Recent Outpatient Visits           9 months ago Essential hypertension   [place] [facility] [place] - A Dept Of [place]. [facility] Marcine Matar, MD   1 year ago Essential hypertension   [place] [facility] [place] - A Dept Of [place]. [facility] Marcine Matar, MD   2 years ago [facility]  discharge follow-up   [place] [facility] - A Dept Of [place]. [facility] Marcine Matar, MD   2 years ago Essential hypertension   [place] [facility] [place] - A Dept Of [place]. [facility] Marcine Matar, MD   2 years ago Essential hypertension   [place] [facility] [place] - A Dept Of [place]. [facility] Drucilla Chalet, RPH-CPP

## 2023-06-08 NOTE — Telephone Encounter (Signed)
I received refill request for Xarelto and amlodipine.  Patient has not been seen since February of this year.  Let him know that I have given 1 month refill.  He needs to be seen prior to running out before any further refills will be given.

## 2023-06-08 NOTE — Telephone Encounter (Signed)
Called but no answer. LVM to inform patient that refill was sent to pharmacy but to call back to schedule a follow-up appointment for additional refills.

## 2023-06-14 NOTE — Telephone Encounter (Signed)
Called but no answer. LVM informing patient to call back to schedule a follow-up appointment.

## 2023-06-15 NOTE — Telephone Encounter (Signed)
FYI. Third attempt. Called but no answer. LVM to call back to schedule a f/u appointment.

## 2023-07-09 ENCOUNTER — Other Ambulatory Visit: Payer: Self-pay | Admitting: Internal Medicine

## 2023-07-09 DIAGNOSIS — I10 Essential (primary) hypertension: Secondary | ICD-10-CM

## 2023-07-24 ENCOUNTER — Other Ambulatory Visit: Payer: Self-pay | Admitting: Internal Medicine

## 2023-07-24 DIAGNOSIS — I2699 Other pulmonary embolism without acute cor pulmonale: Secondary | ICD-10-CM

## 2023-07-24 DIAGNOSIS — I10 Essential (primary) hypertension: Secondary | ICD-10-CM

## 2023-07-24 DIAGNOSIS — D6859 Other primary thrombophilia: Secondary | ICD-10-CM

## 2023-08-01 ENCOUNTER — Ambulatory Visit: Payer: Self-pay

## 2023-08-01 ENCOUNTER — Other Ambulatory Visit: Payer: Self-pay | Admitting: Internal Medicine

## 2023-08-01 DIAGNOSIS — D6859 Other primary thrombophilia: Secondary | ICD-10-CM

## 2023-08-01 DIAGNOSIS — I2699 Other pulmonary embolism without acute cor pulmonale: Secondary | ICD-10-CM

## 2023-08-01 DIAGNOSIS — I10 Essential (primary) hypertension: Secondary | ICD-10-CM

## 2023-08-01 NOTE — Telephone Encounter (Signed)
 Medication Refill -  Most Recent Primary Care Visit:  Provider: VICCI SOBER B  Department[facility]  Visit Type: OFFICE VISIT  Date: 08/31/2022 Medication: Rivaroxaban  20 mg /amLODipine  (NORVASC ) 10 MG tablet Was denied pt now has an appt on 03/17 at 3:50  Has the patient contacted their pharmacy? yes (Agent: If yes, when and what did the pharmacy advise?)contact pcp  Is this the correct pharmacy for this prescription? yes  This is the patient's preferred pharmacy:  [facility] [address], [place] - [address] [place] [address] Phone: [phone] Fax: [phone]   Has the prescription been filled recently? yes  Is the patient out of the medication? yes  Has the patient been seen for an appointment in the last year OR does the patient have an upcoming appointment? yes  Can we respond through MyChart? yes  Agent: Please be advised that Rx refills may take up to 3 business days. We ask that you follow-up with your pharmacy.

## 2023-08-01 NOTE — Telephone Encounter (Signed)
  Chief Complaint: wrist swelling Symptoms: L wrist swelling puffiness and mild, pain at times  Frequency: 6 months had a fall  Pertinent Negatives: NA Disposition: [] ED /[] Urgent Care (no appt availability in office) / [] Appointment(In office/virtual)/ []  [place] [facility]/ [] Home Care/ [] Refused Recommended Disposition /[] [place] [facility]/ []  Follow-up with PCP Additional Notes: pt states he had a fall 6 months ago during a rainy day and slipped while taking out trash, hurt his knee so went to Ortho for that but didn't notice injury then. Has had on off swelling and pain but pain is ok right now, he just noticed L wrist bigger than R wrist. Pt aint in dire need of appt and wanting to know if he needs to see PCP or call Ortho for appt. Advised pt since he is already established with ortho could call them tomorrow for appt and if they booked out before 08/24/23 which is our first appt he could call back. Pt states he will call them since he is established and been seen.   Summary: wrist swelled   Pt called in says left wrist is swollen         Reason for Disposition  Small firm or hard BALL OR LUMP  Answer Assessment - Initial Assessment Questions 1. ONSET: When did the swelling start? (e.g., minutes, hours, days, weeks)     6 months ago had injury and slipped on L wrist  2. LOCATION: What part of the wrist is swollen?  Are both wrists swollen or just one wrist?     L wrist  3. SEVERITY: How bad is the swelling?    * NONE: No joint swelling.   * SKIN ONLY: Localized; small area of puffy or swollen skin.   * BALL OR LUMP: There is a small firm ball or lump; size of a pea, marble, or grape.   * MILD: Joint looks or feels mildly swollen or puffy.   * MODERATE: Swollen; interferes with normal activities (e.g., work or school); decreased range of movement.   * SEVERE: Very swollen; can't move swollen joint at all; unable to hold a cup of water.     Puffiness to mild  6.  OTHER SYMPTOMS: Do you have any other symptoms? (e.g., fever, hand pain)     Wrist pain at times  Protocols used: Wrist Swelling-A-AH

## 2023-08-02 ENCOUNTER — Other Ambulatory Visit: Payer: Self-pay

## 2023-08-02 NOTE — Telephone Encounter (Signed)
 Requested medication (s) are due for refill today: Yes  Requested medication (s) are on the active medication list: Yes  Last refill:  06/08/23  Future visit scheduled: Yes 10/03/23 at 1550.  Notes to clinic:  Unable to refill per protocol, courtesy refill already given, routing for provider approval.      Requested Prescriptions  Pending Prescriptions Disp Refills   amLODipine  (NORVASC ) 10 MG tablet 30 tablet 0    Sig: Take 1 tablet (10 mg total) by mouth daily. Please make appointment with Dr. Vicci.     Cardiovascular: Calcium Channel Blockers 2 Failed - 08/02/2023  4:37 PM      Failed - Valid encounter within last 6 months    Recent Outpatient Visits           11 months ago Essential hypertension   [place] [facility] - [facility] Dept Of [place]. [facility] Vicci Barnie NOVAK, MD   1 year ago Essential hypertension   [place] [facility] [place] - A Dept Of [place]. [facility] Vicci Barnie NOVAK, MD   2 years ago Hospital discharge follow-up   [place] [facility] - A Dept Of [place]. [facility] Vicci Barnie NOVAK, MD   2 years ago Essential hypertension   [place] [facility] [place] - A Dept Of [employer]. [facility] Vicci Barnie NOVAK, MD   2 years ago Essential hypertension    [facility] [place] - A Dept Of [place]. [facility] Fleeta Tonia Garnette LITTIE, RPH-CPP       Future Appointments             In 2 months Vicci, Barnie NOVAK, MD [facility] [place] - A Dept Of Jolynn DEL. [facility]            Passed - Last BP in normal range    BP Readings from Last 1 Encounters:  08/31/22 104/68         Passed - Last Heart Rate in normal range    Pulse Readings from Last 1 Encounters:  08/31/22 70          rivaroxaban  (XARELTO ) 20 MG TABS tablet 30 tablet 0    Sig: Take 1 tablet (20 mg total) by mouth daily with supper.     Hematology: Anticoagulants -  rivaroxaban  Failed - 08/02/2023  4:37 PM      Failed - ALT in normal range and within 360 days    ALT  Date Value Ref Range Status  05/18/2022 16 0 - 44 IU/L Final  01/30/2021 <6 0 - 44 U/L Final         Failed - AST in normal range and within 360 days    AST  Date Value Ref Range Status  05/18/2022 23 0 - 40 IU/L Final  01/30/2021 8 (L) 15 - 41 U/L Final         Failed - Cr in normal range and within 360 days    Creatinine  Date Value Ref Range Status  01/30/2021 1.68 (H) 0.61 - 1.24 mg/dL Final   Creatinine, Ser  Date Value Ref Range Status  05/18/2022 1.30 (H) 0.76 - 1.27 mg/dL Final         Failed - HCT in normal range and within 360 days    Hematocrit  Date Value Ref Range Status  05/18/2022 43.7 37.5 - 51.0 % Final  Failed - HGB in normal range and within 360 days    Hemoglobin  Date Value Ref Range Status  05/18/2022 15.1 13.0 - 17.7 g/dL Final         Failed - PLT in normal range and within 360 days    Platelets  Date Value Ref Range Status  05/18/2022 214 150 - 450 x10E3/uL Final         Failed - eGFR is 15 or above and within 360 days    GFR calc Af Amer  Date Value Ref Range Status  12/10/2019 89 >59 mL/min/1.73 Final    Comment:    **[facility] currently reports eGFR in compliance with the current**   recommendations of the [employer]. [facility] will   update reporting as new guidelines are published from the NKF-ASN   Task force.    GFR, Estimated  Date Value Ref Range Status  06/15/2021 >60 >60 mL/min Final    Comment:    (NOTE) Calculated using the CKD-EPI Creatinine Equation (2021)   01/30/2021 47 (L) >60 mL/min Final    Comment:    (NOTE) Calculated using the CKD-EPI Creatinine Equation (2021)    eGFR  Date Value Ref Range Status  05/18/2022 63 >59 mL/min/1.73 Final         Passed - Patient is not pregnant      Passed - Valid encounter within last 12 months    Recent Outpatient Visits           11 months  ago Essential hypertension   [place] [facility] [place] - A Dept Of [place]. [facility] Vicci Barnie NOVAK, MD   1 year ago Essential hypertension   [place] [facility] [place] - A Dept Of [place]. [facility] Vicci Barnie NOVAK, MD   2 years ago Hospital discharge follow-up   [place] [facility] - A Dept Of [place]. [facility] Vicci Barnie NOVAK, MD   2 years ago Essential hypertension   [place] [facility] [place] - A Dept Of [place]. [facility] Vicci Barnie NOVAK, MD   2 years ago Essential hypertension   [place] [facility] [place] - A Dept Of [place]. [facility] Fleeta Tonia Garnette LITTIE, RPH-CPP       Future Appointments             In 2 months Vicci, Barnie NOVAK, MD [facility] [place] - A Dept Of Jolynn DEL. [facility]

## 2023-08-02 NOTE — Telephone Encounter (Signed)
 Noted.

## 2023-08-03 MED ORDER — RIVAROXABAN 20 MG PO TABS: 20.0000 mg | ORAL_TABLET | Freq: Every day | ORAL | 1 refills | Status: DC

## 2023-08-03 MED ORDER — AMLODIPINE BESYLATE 10 MG PO TABS: 10.0000 mg | ORAL_TABLET | Freq: Every day | ORAL | 1 refills | Status: DC

## 2023-10-03 ENCOUNTER — Other Ambulatory Visit: Payer: Self-pay | Admitting: Internal Medicine

## 2023-10-03 ENCOUNTER — Ambulatory Visit: Payer: Medicaid Other | Admitting: Internal Medicine

## 2023-10-03 DIAGNOSIS — I10 Essential (primary) hypertension: Secondary | ICD-10-CM

## 2023-11-12 ENCOUNTER — Other Ambulatory Visit: Payer: Self-pay | Admitting: Internal Medicine

## 2023-11-12 DIAGNOSIS — I2699 Other pulmonary embolism without acute cor pulmonale: Secondary | ICD-10-CM

## 2023-11-12 DIAGNOSIS — D6859 Other primary thrombophilia: Secondary | ICD-10-CM

## 2023-11-12 DIAGNOSIS — I10 Essential (primary) hypertension: Secondary | ICD-10-CM

## 2023-11-14 NOTE — Telephone Encounter (Signed)
 Requested medication (s) are due for refill today: yes  Requested medication (s) are on the active medication list: yes  Last refill:  08/03/23 #30 1 RF    for each requested med   Future visit scheduled: no  Notes to clinic:  Pt did not keep appt in March. Called pt and LM on VM to call back to schedule   Requested Prescriptions  Pending Prescriptions Disp Refills   XARELTO  20 MG TABS tablet [Pharmacy Med Name: XARELTO  20 MG TABLET] 30 tablet 1    Sig: TAKE 1 TABLET (20 MG TOTAL) BY MOUTH DAILY WITH SUPPER. MUST KEEP APPOINTMENT IN MARCH WITH DR. Lincoln Renshaw     Hematology: Anticoagulants - rivaroxaban  Failed - 11/14/2023 11:08 AM      Failed - ALT in normal range and within 360 days    ALT  Date Value Ref Range Status  05/18/2022 16 0 - 44 IU/L Final  01/30/2021 <6 0 - 44 U/L Final         Failed - AST in normal range and within 360 days    AST  Date Value Ref Range Status  05/18/2022 23 0 - 40 IU/L Final  01/30/2021 8 (L) 15 - 41 U/L Final         Failed - Cr in normal range and within 360 days    Creatinine  Date Value Ref Range Status  01/30/2021 1.68 (H) 0.61 - 1.24 mg/dL Final   Creatinine, Ser  Date Value Ref Range Status  05/18/2022 1.30 (H) 0.76 - 1.27 mg/dL Final         Failed - HCT in normal range and within 360 days    Hematocrit  Date Value Ref Range Status  05/18/2022 43.7 37.5 - 51.0 % Final         Failed - HGB in normal range and within 360 days    Hemoglobin  Date Value Ref Range Status  05/18/2022 15.1 13.0 - 17.7 g/dL Final         Failed - PLT in normal range and within 360 days    Platelets  Date Value Ref Range Status  05/18/2022 214 150 - 450 x10E3/uL Final         Failed - eGFR is 15 or above and within 360 days    GFR calc Af Amer  Date Value Ref Range Status  12/10/2019 89 >59 mL/min/1.73 Final    Comment:    **[facility] currently reports eGFR in compliance with the current**   recommendations of the [employer].  [facility] will   update reporting as new guidelines are published from the NKF-ASN   Task force.    GFR, Estimated  Date Value Ref Range Status  06/15/2021 >60 >60 mL/min Final    Comment:    (NOTE) Calculated using the CKD-EPI Creatinine Equation (2021)   01/30/2021 47 (L) >60 mL/min Final    Comment:    (NOTE) Calculated using the CKD-EPI Creatinine Equation (2021)    eGFR  Date Value Ref Range Status  05/18/2022 63 >59 mL/min/1.73 Final         Failed - Valid encounter within last 12 months    Recent Outpatient Visits           1 year ago Essential hypertension   [place] [facility] [place] - A Dept Of . [facility] Lawrance Presume, MD   1 year ago Essential hypertension    [facility] [place] -  A Dept Of [place]. [facility] Lawrance Presume, MD   2 years ago Hospital discharge follow-up   [place] [facility] - A Dept Of [place]. [facility] Lawrance Presume, MD   2 years ago Essential hypertension   [place] [facility] [place] - A Dept Of [place]. [facility] Lawrance Presume, MD   3 years ago Essential hypertension   [place] [facility] [place] - A Dept Of [place]. [facility] Valente Gaskin, RPH-CPP              Passed - Patient is not pregnant       amLODipine  (NORVASC ) 10 MG tablet [Pharmacy Med Name: AMLODIPINE  BESYLATE 10 MG TAB] 30 tablet 1    Sig: Take 1 tablet (10 mg total) by mouth daily. Keep in March with Dr. Lincoln Renshaw.     Cardiovascular: Calcium Channel Blockers 2 Failed - 11/14/2023 11:08 AM      Failed - Valid encounter within last 6 months    Recent Outpatient Visits           1 year ago Essential hypertension   [place] [facility] - A Dept Of [place]. [facility] Lawrance Presume, MD   1 year ago Essential hypertension   [place] [facility] [place] - A Dept Of [place]. [facility] Lawrance Presume, MD   2 years ago Hospital discharge follow-up   [place] [facility] - A Dept Of [place]. [facility] Lawrance Presume, MD   2 years ago Essential hypertension   [place] [facility] [place] - A Dept Of [place]. [facility] Lawrance Presume, MD   3 years ago Essential hypertension   [place] [facility] [place] - A Dept Of [place]. [facility] [place], [place] L, RPH-CPP              Passed - Last BP in normal range    BP Readings from Last 1 Encounters:  08/31/22 104/68         Passed - Last Heart Rate in normal range    Pulse Readings from Last 1 Encounters:  08/31/22 70

## 2023-12-06 ENCOUNTER — Telehealth: Payer: Self-pay

## 2023-12-06 DIAGNOSIS — I10 Essential (primary) hypertension: Secondary | ICD-10-CM

## 2023-12-06 NOTE — Telephone Encounter (Signed)
 Patient called in with E2C2 agent and is requesting refills on all his medications, advised patient he may need an appointment and set up one for 5/28 with Madelyn Schick NP.

## 2023-12-07 ENCOUNTER — Telehealth (INDEPENDENT_AMBULATORY_CARE_PROVIDER_SITE_OTHER): Payer: Self-pay | Admitting: Primary Care

## 2023-12-07 ENCOUNTER — Other Ambulatory Visit: Payer: Self-pay

## 2023-12-07 MED ORDER — AMLODIPINE BESYLATE 10 MG PO TABS
10.0000 mg | ORAL_TABLET | Freq: Every day | ORAL | 0 refills | Status: DC
Start: 2023-12-07 — End: 2023-12-14

## 2023-12-07 NOTE — Telephone Encounter (Signed)
 Called & spoke to the patient. Verified name & DOB. Informed that a refill for amlodipine  for 7-day supply has been sent to the pharmacy. Confirmed with patient upcoming appointment on the 28th of May for any further refills. Informed patient that a refill on Xarelto  will be held off until next visit to do blood tests. Patient expressed verbal understanding of all discussed.

## 2023-12-07 NOTE — Telephone Encounter (Signed)
 Refill sent only on amlodipine  for 7-day supply.  He needs to keep the appointment on the 28th of this month for any further refills.  Will hold off on refill on Xarelto  because he has not had any blood test done in 19 mths. Will need to have blood test done on visit scheduled for the 28th.

## 2023-12-07 NOTE — Addendum Note (Signed)
 Addended by: Concetta Dee B on: 12/07/2023 01:11 PM   Modules accepted: Orders

## 2023-12-08 NOTE — Telephone Encounter (Signed)
 Error

## 2023-12-14 ENCOUNTER — Ambulatory Visit (INDEPENDENT_AMBULATORY_CARE_PROVIDER_SITE_OTHER): Payer: Self-pay | Admitting: Primary Care

## 2023-12-14 VITALS — BP 106/66 | HR 69 | Ht 69.0 in | Wt 126.6 lb

## 2023-12-14 DIAGNOSIS — Z760 Encounter for issue of repeat prescription: Secondary | ICD-10-CM

## 2023-12-14 DIAGNOSIS — I2699 Other pulmonary embolism without acute cor pulmonale: Secondary | ICD-10-CM

## 2023-12-14 DIAGNOSIS — I10 Essential (primary) hypertension: Secondary | ICD-10-CM | POA: Diagnosis not present

## 2023-12-14 DIAGNOSIS — D6859 Other primary thrombophilia: Secondary | ICD-10-CM

## 2023-12-14 MED ORDER — RIVAROXABAN 20 MG PO TABS
20.0000 mg | ORAL_TABLET | Freq: Every day | ORAL | 0 refills | Status: DC
Start: 2023-12-14 — End: 2024-02-15

## 2023-12-14 MED ORDER — AMLODIPINE BESYLATE 5 MG PO TABS: 5.0000 mg | ORAL_TABLET | Freq: Every day | ORAL | 0 refills | Status: DC

## 2023-12-14 NOTE — Progress Notes (Signed)
 [facility]   Mr. Mitchell Cooper is a 63 y.o. male presents for hypertension and request medication refills.  Primary care is Dr. Lincoln Renshaw unable to have medications refilled until seen by a provider.  Denies shortness of breath, headaches, chest pain or lower extremity edema, sudden onset, vision changes, unilateral weakness, dizziness, paresthesias   Patient reports adherence with medications. (When he has them)  Dietary habits include: monitors sodium in take  Exercise habits include:walks Family / Social history: Mother CAD   Past Medical History:  Diagnosis Date   Cancer [facility])    Left Tonsillar Cancer    DVT (deep venous thrombosis) (HCC)    Hypertension 1997   has never taken medications   Tobacco dependence    Past Surgical History:  Procedure Laterality Date   IR GASTROSTOMY TUBE MOD SED  09/11/2020   IR IMAGING GUIDED PORT INSERTION  09/11/2020   KNEE SURGERY  approx. 1984   reconstructive surgery of left knee; arthroscopic procedure about 1997   MULTIPLE EXTRACTIONS WITH ALVEOLOPLASTY N/A 09/25/2020   Procedure: MULTIPLE EXTRACTIONS WITH ALVEOLOPLASTY;  Surgeon: Rene Carrier, DMD;  Location: [facility] OR;  Service: Dentistry;  Laterality: N/A;   PERIPHERAL VASCULAR CATHETERIZATION Left 06/16/2016   Procedure: Lower Extremity Venography- Venus Thrombolysis;  Surgeon: Margherita Shell, MD;  Location: [facility] INVASIVE CV LAB;  Service: Cardiovascular;  Laterality: Left;   No Known Allergies Current Outpatient Medications on File Prior to Visit  Medication Sig Dispense Refill   amLODipine  (NORVASC ) 10 MG tablet Take 1 tablet (10 mg total) by mouth daily. Keepappt scheduled for 12/14/23 for additional refills. 8 tablet 0   prochlorperazine  (COMPAZINE ) 10 MG tablet Take 1 tablet (10 mg total) by mouth every 6 (six) hours as needed (Nausea or vomiting). 30 tablet 1   rivaroxaban  (XARELTO ) 20 MG TABS tablet Take 1 tablet (20 mg total) by mouth daily with supper. Must  keep appointment in March with Dr. Lincoln Renshaw 30 tablet 1   No current facility-administered medications on file prior to visit.   Social History   Socioeconomic History   Marital status: Married    Spouse name: Not on file   Number of children: Not on file   Years of education: Not on file   Highest education level: Not on file  Occupational History   Occupation: Holiday representative  Tobacco Use   Smoking status: Some Days    Current packs/day: 0.00    Types: Cigarettes    Last attempt to quit: 11/21/2016    Years since quitting: 7.0   Smokeless tobacco: Never  Vaping Use   Vaping status: Never Used  Substance and Sexual Activity   Alcohol use: Yes    Alcohol/week: 6.0 standard drinks of alcohol    Types: 6 Cans of beer per week   Drug use: No   Sexual activity: Not Currently  Other Topics Concern   Not on file  Social History Narrative   Not on file   Social Drivers of Health   Financial Resource Strain: Low Risk  (09/17/2020)   Overall Financial Resource Strain (CARDIA)    Difficulty of Paying Living Expenses: Not very hard  Food Insecurity: No Food Insecurity (09/17/2020)   Hunger Vital Sign    Worried About Running Out of Food in the Last Year: Never true    Ran Out of Food in the Last Year: Never true  Transportation Needs: No Transportation Needs (09/17/2020)   PRAPARE - Transportation    Lack of Transportation (  Medical): No    Lack of Transportation (Non-Medical): No  Physical Activity: Inactive (11/26/2019)   Exercise Vital Sign    Days of Exercise per Week: 0 days    Minutes of Exercise per Session: 0 min  Stress: No Stress Concern Present (09/17/2020)   [employer] of Occupational Health - Occupational Stress Questionnaire    Feeling of Stress : Only a little  Social Connections: Socially Integrated (09/17/2020)   Social Connection and Isolation Panel [NHANES]    Frequency of Communication with Friends and Family: More than three times a week    Frequency of Social  Gatherings with Friends and Family: More than three times a week    Attends Religious Services: More than 4 times per year    Active Member of [employer] or Organizations: Yes    Attends Engineer, structural: More than 4 times per year    Marital Status: Married  Catering manager Violence: Not on file   Family History  Problem Relation Age of Onset   CAD Mother 83   Health Maintenance  Topic Date Due   Hepatitis C Screening  Never done   DTaP/Tdap/Td (1 - Tdap) Never done   Zoster Vaccines- Shingrix (1 of 2) Never done   Colonoscopy  Never done   COVID-19 Vaccine (5 - Pfizer risk 2024-25 season) 09/26/2023   INFLUENZA VACCINE  02/17/2024   HIV Screening  Completed   HPV VACCINES  Aged Out   Meningococcal B Vaccine  Aged Out     OBJECTIVE:  Vitals:   12/14/23 0948  BP: 106/66  Pulse: 69  SpO2: 100%  Weight: 126 lb 9.6 oz (57.4 kg)  Height: 5\' 9"  (1.753 m)    Physical Exam Vitals reviewed.  Constitutional:      Comments: Body mass index is 18.7 kg/m.   HENT:     Head: Normocephalic.     Right Ear: Tympanic membrane and external ear normal.     Left Ear: Tympanic membrane and external ear normal.     Nose: Nose normal.  Eyes:     Extraocular Movements: Extraocular movements intact.     Pupils: Pupils are equal, round, and reactive to light.  Cardiovascular:     Rate and Rhythm: Normal rate and regular rhythm.  Pulmonary:     Effort: Pulmonary effort is normal.     Breath sounds: Normal breath sounds.  Abdominal:     General: Bowel sounds are normal.     Palpations: Abdomen is soft.  Musculoskeletal:        General: Normal range of motion.     Cervical back: Normal range of motion.  Skin:    General: Skin is warm and dry.  Neurological:     Mental Status: He is alert and oriented to person, place, and time.  Psychiatric:        Mood and Affect: Mood normal.        Behavior: Behavior normal.        Thought Content: Thought content normal.         Judgment: Judgment normal.      ROS  Last 3 Office BP readings: BP Readings from Last 3 Encounters:  12/14/23 106/66  08/31/22 104/68  05/18/22 119/81    BMET    Component Value Date/Time   NA 142 05/18/2022 1535   K 4.2 05/18/2022 1535   CL 103 05/18/2022 1535   CO2 26 05/18/2022 1535   GLUCOSE 97 05/18/2022 1535   GLUCOSE  104 (H) 06/15/2021 0810   BUN 15 05/18/2022 1535   CREATININE 1.30 (H) 05/18/2022 1535   CREATININE 1.68 (H) 01/30/2021 1406   CALCIUM 9.5 05/18/2022 1535   GFRNONAA >60 06/15/2021 0810   GFRNONAA 47 (L) 01/30/2021 1406   GFRAA 89 12/10/2019 1348    Renal function: Pat was seen today for medical management of chronic issues.  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   Medication refill 2/2 Other orders   rivaroxaban  (XARELTO ) 20 MG TABS tablet; Take 1 tablet (20 mg total) by mouth daily with supper.  Essential hypertension  -Counseled on lifestyle modifications for blood pressure control including reduced dietary sodium, increased exercise, weight reduction and adequate sleep. Also, educated patient about the risk for cardiovascular events, stroke and heart attack. Also counseled patient about the importance of medication adherence. If you participate in smoking, it is important to stop using tobacco as this will increase the risks associated with uncontrolled blood pressure.   Goal BP:  For patients younger than 60: Goal BP < 130/80. For patients 60 and older: Goal BP < 140/90. For patients with diabetes: Goal BP < 130/80. Your most recent BP: 106/66 -  trend is hypotensive   Minimize salt intake. Minimize alcohol intake   CMP14+EGFR -     CBC with Differential    This note has been created with Education officer, environmental. Any  transcriptional errors are unintentional.   Marius Siemens, NP 12/14/2023, 10:15 AM

## 2023-12-15 ENCOUNTER — Encounter: Payer: Self-pay | Admitting: Hematology and Oncology

## 2023-12-15 ENCOUNTER — Ambulatory Visit (INDEPENDENT_AMBULATORY_CARE_PROVIDER_SITE_OTHER): Payer: Self-pay | Admitting: Primary Care

## 2023-12-15 ENCOUNTER — Other Ambulatory Visit: Payer: Self-pay

## 2023-12-15 LAB — CMP14+EGFR
ALT: 10 IU/L (ref 0–44)
AST: 23 IU/L (ref 0–40)
Albumin: 3.8 g/dL — ABNORMAL LOW (ref 3.9–4.9)
Alkaline Phosphatase: 84 IU/L (ref 44–121)
BUN/Creatinine Ratio: 14 (ref 10–24)
BUN: 16 mg/dL (ref 8–27)
Bilirubin Total: 0.3 mg/dL (ref 0.0–1.2)
CO2: 24 mmol/L (ref 20–29)
Calcium: 9 mg/dL (ref 8.6–10.2)
Chloride: 103 mmol/L (ref 96–106)
Creatinine, Ser: 1.15 mg/dL (ref 0.76–1.27)
Globulin, Total: 3.3 g/dL (ref 1.5–4.5)
Glucose: 126 mg/dL — ABNORMAL HIGH (ref 70–99)
Potassium: 4.2 mmol/L (ref 3.5–5.2)
Sodium: 144 mmol/L (ref 134–144)
Total Protein: 7.1 g/dL (ref 6.0–8.5)
eGFR: 72 mL/min/{1.73_m2} (ref >59)

## 2023-12-15 LAB — CBC WITH DIFFERENTIAL/PLATELET
Basophils Absolute: 0 10*3/uL (ref 0.0–0.2)
Basos: 1 %
EOS (ABSOLUTE): 0.2 10*3/uL (ref 0.0–0.4)
Eos: 4 %
Hematocrit: 44.3 % (ref 37.5–51.0)
Hemoglobin: 14.2 g/dL (ref 13.0–17.7)
Immature Grans (Abs): 0 10*3/uL (ref 0.0–0.1)
Immature Granulocytes: 0 %
Lymphocytes Absolute: 0.8 10*3/uL (ref 0.7–3.1)
Lymphs: 13 %
MCH: 29.6 pg (ref 26.6–33.0)
MCHC: 32.1 g/dL (ref 31.5–35.7)
MCV: 92 fL (ref 79–97)
Monocytes Absolute: 0.8 10*3/uL (ref 0.1–0.9)
Monocytes: 14 %
Neutrophils Absolute: 4.1 10*3/uL (ref 1.4–7.0)
Neutrophils: 68 %
Platelets: 212 10*3/uL (ref 150–450)
RBC: 4.8 x10E6/uL (ref 4.14–5.80)
RDW: 13.7 % (ref 11.6–15.4)
WBC: 5.9 10*3/uL (ref 3.4–10.8)

## 2024-01-04 ENCOUNTER — Telehealth (INDEPENDENT_AMBULATORY_CARE_PROVIDER_SITE_OTHER): Payer: Self-pay | Admitting: Primary Care

## 2024-01-04 NOTE — Telephone Encounter (Signed)
 Called pt to confirm appt. Pt did not answer and VM full.

## 2024-01-05 ENCOUNTER — Ambulatory Visit (INDEPENDENT_AMBULATORY_CARE_PROVIDER_SITE_OTHER): Admitting: Primary Care

## 2024-02-14 ENCOUNTER — Other Ambulatory Visit (INDEPENDENT_AMBULATORY_CARE_PROVIDER_SITE_OTHER): Payer: Self-pay | Admitting: Primary Care

## 2024-02-14 DIAGNOSIS — I2699 Other pulmonary embolism without acute cor pulmonale: Secondary | ICD-10-CM

## 2024-02-14 DIAGNOSIS — D6859 Other primary thrombophilia: Secondary | ICD-10-CM

## 2024-02-15 NOTE — Telephone Encounter (Signed)
 Requested Prescriptions  Pending Prescriptions Disp Refills   XARELTO  20 MG TABS tablet [Pharmacy Med Name: XARELTO  20 MG TABLET] 30 tablet 0    Sig: TAKE 1 TABLET (20 MG TOTAL) BY MOUTH DAILY WITH SUPPER. MUST KEEP APPOINTMENT IN [facility] WITH DR. VICCI     Hematology: Anticoagulants - rivaroxaban  Passed - 02/15/2024  2:43 PM      Passed - ALT in normal range and within 360 days    ALT  Date Value Ref Range Status  12/14/2023 10 0 - 44 IU/L Final  01/30/2021 <6 0 - 44 U/L Final         Passed - AST in normal range and within 360 days    AST  Date Value Ref Range Status  12/14/2023 23 0 - 40 IU/L Final  01/30/2021 8 (L) 15 - 41 U/L Final         Passed - Cr in normal range and within 360 days    Creatinine  Date Value Ref Range Status  01/30/2021 1.68 (H) 0.61 - 1.24 mg/dL Final   Creatinine, Ser  Date Value Ref Range Status  12/14/2023 1.15 0.76 - 1.27 mg/dL Final         Passed - HCT in normal range and within 360 days    Hematocrit  Date Value Ref Range Status  12/14/2023 44.3 37.5 - 51.0 % Final         Passed - HGB in normal range and within 360 days    Hemoglobin  Date Value Ref Range Status  12/14/2023 14.2 13.0 - 17.7 g/dL Final         Passed - PLT in normal range and within 360 days    Platelets  Date Value Ref Range Status  12/14/2023 212 150 - 450 x10E3/uL Final         Passed - eGFR is 15 or above and within 360 days    GFR calc Af Amer  Date Value Ref Range Status  12/10/2019 89 >59 mL/min/1.73 Final    Comment:    **[facility] currently reports eGFR in compliance with the current**   recommendations of the [employer]. [facility] will   update reporting as new guidelines are published from the NKF-ASN   Task force.    GFR, Estimated  Date Value Ref Range Status  06/15/2021 >60 >60 mL/min Final    Comment:    (NOTE) Calculated using the CKD-EPI Creatinine Equation (2021)   01/30/2021 47 (L) >60 mL/min Final    Comment:     (NOTE) Calculated using the CKD-EPI Creatinine Equation (2021)    eGFR  Date Value Ref Range Status  12/14/2023 72 >59 mL/min/1.73 Final         Passed - Patient is not pregnant      Passed - Valid encounter within last 12 months    Recent Outpatient Visits           2 months ago Essential hypertension   [place] [facility] Celestia Rosaline SQUIBB, NP   1 year ago Essential hypertension   [facility] [place]. [facility] VICCI Barnie NOVAK, MD   1 year ago Essential hypertension   [facility] Dept Of [place]. [facility] VICCI Barnie NOVAK, MD   2 years ago Hospital discharge follow-up   [facility] - A Dept Of [place]. [facility] [place],  Barnie NOVAK, MD   3 years ago Essential hypertension   [facility] Dept Of [place]. [facility] Vicci Barnie NOVAK, MD

## 2024-02-16 ENCOUNTER — Other Ambulatory Visit: Payer: Self-pay

## 2024-02-29 DIAGNOSIS — F310 Bipolar disorder, current episode hypomanic: Secondary | ICD-10-CM | POA: Diagnosis not present

## 2024-03-11 ENCOUNTER — Other Ambulatory Visit (INDEPENDENT_AMBULATORY_CARE_PROVIDER_SITE_OTHER): Payer: Self-pay | Admitting: Primary Care

## 2024-03-14 ENCOUNTER — Telehealth: Payer: Self-pay | Admitting: Internal Medicine

## 2024-03-14 NOTE — Telephone Encounter (Signed)
 Called patient, unable to reach patient. Left VM for patient to call back.

## 2024-03-14 NOTE — Telephone Encounter (Signed)
-----   Message from Barnie Louder sent at 03/14/2024  9:32 AM EDT ----- Regarding: F/U appt Needs f/u appt with me early October for f/u HTN and history of blood clots.

## 2024-06-17 ENCOUNTER — Other Ambulatory Visit (INDEPENDENT_AMBULATORY_CARE_PROVIDER_SITE_OTHER): Payer: Self-pay | Admitting: Internal Medicine

## 2024-06-21 ENCOUNTER — Observation Stay (HOSPITAL_COMMUNITY)

## 2024-06-21 ENCOUNTER — Other Ambulatory Visit: Payer: Self-pay

## 2024-06-21 ENCOUNTER — Emergency Department (HOSPITAL_COMMUNITY)

## 2024-06-21 ENCOUNTER — Observation Stay (HOSPITAL_COMMUNITY): Admission: EM | Admit: 2024-06-21 | Discharge: 2024-06-23 | Disposition: A

## 2024-06-21 ENCOUNTER — Encounter (HOSPITAL_COMMUNITY): Payer: Self-pay

## 2024-06-21 DIAGNOSIS — I2699 Other pulmonary embolism without acute cor pulmonale: Secondary | ICD-10-CM | POA: Diagnosis not present

## 2024-06-21 DIAGNOSIS — M25532 Pain in left wrist: Secondary | ICD-10-CM | POA: Diagnosis not present

## 2024-06-21 DIAGNOSIS — R55 Syncope and collapse: Secondary | ICD-10-CM | POA: Diagnosis not present

## 2024-06-21 DIAGNOSIS — M19032 Primary osteoarthritis, left wrist: Secondary | ICD-10-CM | POA: Diagnosis not present

## 2024-06-21 LAB — CBC WITH DIFFERENTIAL/PLATELET
Abs Immature Granulocytes: 0.02 K/uL (ref 0.00–0.07)
Basophils Absolute: 0 K/uL (ref 0.0–0.1)
Basophils Relative: 1 %
Eosinophils Absolute: 0.1 K/uL (ref 0.0–0.5)
Eosinophils Relative: 2 %
HCT: 40.9 % (ref 39.0–52.0)
Hemoglobin: 13.9 g/dL (ref 13.0–17.0)
Immature Granulocytes: 0 %
Lymphocytes Relative: 10 %
Lymphs Abs: 0.6 K/uL — ABNORMAL LOW (ref 0.7–4.0)
MCH: 29.8 pg (ref 26.0–34.0)
MCHC: 34 g/dL (ref 30.0–36.0)
MCV: 87.8 fL (ref 80.0–100.0)
Monocytes Absolute: 0.8 K/uL (ref 0.1–1.0)
Monocytes Relative: 13 %
Neutro Abs: 4.3 K/uL (ref 1.7–7.7)
Neutrophils Relative %: 74 %
Platelets: 223 K/uL (ref 150–400)
RBC: 4.66 MIL/uL (ref 4.22–5.81)
RDW: 15.3 % (ref 11.5–15.5)
WBC: 5.8 K/uL (ref 4.0–10.5)
nRBC: 0 % (ref 0.0–0.2)

## 2024-06-21 LAB — ECHOCARDIOGRAM COMPLETE BUBBLE STUDY
AR max vel: 2.16 cm2
AV Area VTI: 2.32 cm2
AV Area mean vel: 2.06 cm2
AV Mean grad: 2 mmHg
AV Peak grad: 4.1 mmHg
Ao pk vel: 1.01 m/s
Area-P 1/2: 4.27 cm2
Calc EF: 70.2 %
MV VTI: 2.22 cm2
S' Lateral: 2.3 cm
Single Plane A2C EF: 71.5 %
Single Plane A4C EF: 67.5 %

## 2024-06-21 LAB — TROPONIN I (HIGH SENSITIVITY)
Troponin I (High Sensitivity): 4 ng/L (ref <18)
Troponin I (High Sensitivity): 3 ng/L (ref <18)

## 2024-06-21 LAB — HIV ANTIBODY (ROUTINE TESTING W REFLEX): HIV Screen 4th Generation wRfx: NONREACTIVE

## 2024-06-21 LAB — COMPREHENSIVE METABOLIC PANEL WITH GFR
ALT: 15 U/L (ref 0–44)
AST: 25 U/L (ref 15–41)
Albumin: 3 g/dL — ABNORMAL LOW (ref 3.5–5.0)
Alkaline Phosphatase: 66 U/L (ref 38–126)
Anion gap: 9 (ref 5–15)
BUN: 18 mg/dL (ref 8–23)
CO2: 28 mmol/L (ref 22–32)
Calcium: 8.8 mg/dL — ABNORMAL LOW (ref 8.9–10.3)
Chloride: 102 mmol/L (ref 98–111)
Creatinine, Ser: 1.44 mg/dL — ABNORMAL HIGH (ref 0.61–1.24)
GFR, Estimated: 55 mL/min — ABNORMAL LOW (ref >60)
Glucose, Bld: 214 mg/dL — ABNORMAL HIGH (ref 70–99)
Potassium: 3.6 mmol/L (ref 3.5–5.1)
Sodium: 139 mmol/L (ref 135–145)
Total Bilirubin: 0.8 mg/dL (ref 0.0–1.2)
Total Protein: 6.9 g/dL (ref 6.5–8.1)

## 2024-06-21 LAB — D-DIMER, QUANTITATIVE: D-Dimer, Quant: 5.09 ug{FEU}/mL — ABNORMAL HIGH (ref 0.00–0.50)

## 2024-06-21 LAB — ETHANOL: Alcohol, Ethyl (B): <15 mg/dL (ref <15)

## 2024-06-21 MED ORDER — ENOXAPARIN SODIUM 40 MG/0.4ML IJ SOSY: 40.0000 mg | PREFILLED_SYRINGE | INTRAMUSCULAR | Status: DC

## 2024-06-21 MED ORDER — RIVAROXABAN 15 MG PO TABS
15.0000 mg | ORAL_TABLET | Freq: Two times a day (BID) | ORAL | Status: DC
  Administered 2024-06-21 – 2024-06-22 (×3): 15 mg via ORAL
  Filled 2024-06-21 (×3): qty 1

## 2024-06-21 MED ORDER — IOHEXOL 350 MG/ML SOLN
75.0000 mL | Freq: Once | INTRAVENOUS | Status: AC | PRN
  Administered 2024-06-21: 75 mL via INTRAVENOUS

## 2024-06-21 MED ORDER — PNEUMOCOCCAL 20-VAL CONJ VACC 0.5 ML IM SUSY
0.5000 mL | PREFILLED_SYRINGE | INTRAMUSCULAR | Status: AC
  Administered 2024-06-22: 0.5 mL via INTRAMUSCULAR
  Filled 2024-06-21: qty 0.5

## 2024-06-21 MED ORDER — OXYCODONE-ACETAMINOPHEN 5-325 MG PO TABS
1.0000 | ORAL_TABLET | Freq: Once | ORAL | Status: AC
  Administered 2024-06-21: 1 via ORAL
  Filled 2024-06-21: qty 1

## 2024-06-21 MED ORDER — SODIUM CHLORIDE 0.9 % IV SOLN: INTRAVENOUS | Status: DC

## 2024-06-21 MED ORDER — OXYCODONE-ACETAMINOPHEN 5-325 MG PO TABS
1.0000 | ORAL_TABLET | ORAL | Status: DC | PRN
  Administered 2024-06-21: 1 via ORAL
  Filled 2024-06-21: qty 1

## 2024-06-21 MED ORDER — AMLODIPINE BESYLATE 5 MG PO TABS
5.0000 mg | ORAL_TABLET | Freq: Every day | ORAL | Status: DC
  Administered 2024-06-21 – 2024-06-23 (×3): 5 mg via ORAL
  Filled 2024-06-21 (×3): qty 1

## 2024-06-21 NOTE — H&P (Signed)
 History and Physical    Patient: Mitchell Cooper FMW:[id number] DOB: [birth year] DOA: 06/21/2024 DOS: the patient was seen and examined on 06/21/2024 PCP: Vicci Barnie NOVAK, MD  Patient coming from: Home  Chief Complaint:  Chief Complaint  Patient presents with   Chest Pain   Hand Pain   HPI: Mitchell Cooper is a 63 y.o. male with medical history significant of L tonsillar sarcoma (s/p mandibulotomy, radical tonsillectomy, and RFFF), tobacco dependence, VTE x3 (on OAC, Xarelto , has been without meds for the past 2-3 months due to refill issues), and HTN who p/w syncope a week ago followed by pre-syncope sx today who was found to have AKI, and acute PE iso missing OAC for months.  The patient presented with chest pain and shortness of breath that began on a Tuesday. The patient reported experiencing similar symptoms in the past due to blood clots. The patient has a history of recurrent blood clots, having had them three times, with one instance leading to clots in the lungs. The patient had been off blood thinners, specifically Xarelto , for about two and a half months, possibly longer, due to issues with prescription renewals (NOTE; pt reports having to see his PCP almost monthly to get refills of his OAC). The patient also reported a recent episode of syncope, which occurred the previous week; in fact, he was unable to recall the exact details of the incident, but noted he awoke on the floor and had tenderness in his L wrist. He had similar lightheadedness sx today, and rested on a knee for fear that he would faint again; as such, he presented to the ED for evaluation.  In the ED, pt AFVSS. Labs notable for Cr 1.44 (baseline Cr 1.0-1.1) and D-dimer 5.09. CTA chest PE protocol showed small peripheral pulmonary emboli in lower lobe branches of the right pulmonary artery, mild biapical scarring, and 5 mm ground-glass pulmonary nodule in the right middle lobe; follow-up unenhanced chest CT in 6 to  12 months is recommended to ensure stability. EDP requested admission for acute PE and AKI.   Review of Systems: As mentioned in the history of present illness. All other systems reviewed and are negative. Past Medical History:  Diagnosis Date   Cancer (HCC)    Left Tonsillar Cancer    DVT (deep venous thrombosis) (HCC)    Hypertension 1997   has never taken medications   Tobacco dependence    Past Surgical History:  Procedure Laterality Date   IR GASTROSTOMY TUBE MOD SED  09/11/2020   IR IMAGING GUIDED PORT INSERTION  09/11/2020   KNEE SURGERY  approx. 1984   reconstructive surgery of left knee; arthroscopic procedure about 1997   MULTIPLE EXTRACTIONS WITH ALVEOLOPLASTY N/A 09/25/2020   Procedure: MULTIPLE EXTRACTIONS WITH ALVEOLOPLASTY;  Surgeon: Celena Lum NOVAK, DMD;  Location: [facility] OR;  Service: Dentistry;  Laterality: N/A;   PERIPHERAL VASCULAR CATHETERIZATION Left 06/16/2016   Procedure: Lower Extremity Venography- Venus Thrombolysis;  Surgeon: Gaile LELON New, MD;  Location[facility] INVASIVE CV LAB;  Service: Cardiovascular;  Laterality: Left;   Social History:  reports that he has been smoking cigarettes. He has never used smokeless tobacco. He reports current alcohol use of about 6.0 standard drinks of alcohol per week. He reports that he does not use drugs.  No Known Allergies  Family History  Problem Relation Age of Onset   CAD Mother 34    Prior to Admission medications   Medication Sig Start Date End Date Taking? Authorizing  Provider  amLODipine  (NORVASC ) 5 MG tablet Take 1 tablet (5 mg total) by mouth daily. Please schedule an appointment for more refills. 06/17/24   Vicci Barnie NOVAK, MD  prochlorperazine  (COMPAZINE ) 10 MG tablet Take 1 tablet (10 mg total) by mouth every 6 (six) hours as needed (Nausea or vomiting). 10/02/20   Iruku, Praveena, MD  XARELTO  20 MG TABS tablet TAKE 1 TABLET (20 MG TOTAL) BY MOUTH DAILY WITH SUPPER. MUST KEEP APPOINTMENT IN [facility] WITH DR.  VICCI 02/15/24   Celestia Rosaline SQUIBB, NP    Physical Exam: Vitals:   06/21/24 0649 06/21/24 0654 06/21/24 0915 06/21/24 1109  BP: (!) 141/103  117/85   Pulse:  79 67   Resp:  18 12   Temp:    97.6 F (36.4 C)  TempSrc:    Oral  SpO2:  100% 100%   Weight:  64 kg    Height:  5' 9 (1.753 m)     General: Alert, oriented x3, resting comfortably in no acute distress Respiratory: Lungs clear to auscultation bilaterally with normal respiratory effort; no w/r/r Cardiovascular: Regular rate and rhythm w/o m/r/g   Data Reviewed:  Lab Results  Component Value Date   WBC 5.8 06/21/2024   HGB 13.9 06/21/2024   HCT 40.9 06/21/2024   MCV 87.8 06/21/2024   PLT 223 06/21/2024   Lab Results  Component Value Date   GLUCOSE 214 (H) 06/21/2024   CALCIUM 8.8 (L) 06/21/2024   NA 139 06/21/2024   K 3.6 06/21/2024   CO2 28 06/21/2024   CL 102 06/21/2024   BUN 18 06/21/2024   CREATININE 1.44 (H) 06/21/2024   Lab Results  Component Value Date   ALT 15 06/21/2024   AST 25 06/21/2024   ALKPHOS 66 06/21/2024   BILITOT 0.8 06/21/2024   Lab Results  Component Value Date   INR 1.1 06/14/2021   INR 1.1 09/11/2020   INR 1.3 (H) 11/05/2019   Radiology: CT Angio Chest PE W/Cm &/Or Wo Cm Result Date: 06/21/2024 EXAM: CTA of the Chest with contrast for PE 06/21/2024 10:05:00 AM TECHNIQUE: CTA of the chest was performed without and with the administration of 75 mL of iohexol  (OMNIPAQUE ) 350 MG/ML injection. Multiplanar reformatted images are provided for review. MIP images are provided for review. Automated exposure control, iterative reconstruction, and/or weight based adjustment of the mA/kV was utilized to reduce the radiation dose to as low as reasonably achievable. COMPARISON: 11/03/2019 CLINICAL HISTORY: Pulmonary embolism (PE) suspected, high probability. FINDINGS: PULMONARY ARTERIES: Pulmonary arteries are adequately opacified for evaluation. At least 2 small peripheral filling defects are  noted in lower lobe branches of the right pulmonary artery consistent with pulmonary emboli. Main pulmonary artery is normal in caliber. MEDIASTINUM: The heart and pericardium demonstrate no acute abnormality. There is no acute abnormality of the thoracic aorta. LYMPH NODES: No mediastinal, hilar or axillary lymphadenopathy. LUNGS AND PLEURA: Mild biapical scarring is noted. A 5 mm ground glass opacity is noted posteriorly in the right middle lobe best seen on image 86 of series 6. No focal consolidation or pulmonary edema. No pleural effusion or pneumothorax. UPPER ABDOMEN: Limited images of the upper abdomen are unremarkable. SOFT TISSUES AND BONES: No acute bone or soft tissue abnormality. IMPRESSION: 1. Small peripheral Pulmonary emboli in lower lobe branches of the right pulmonary artery. This finding was discussed with [place], PA, at 10:21 am on 06/21/2024. 2. Mild biapical scarring. 3. 5 mm ground-glass pulmonary nodule in the right middle lobe;  follow-up unenhanced chest CT in 6 to 12 months is recommended to ensure stability. Electronically signed by: Lynwood Seip MD 06/21/2024 10:22 AM EST RP Workstation: [id number]   DG Wrist Complete Left Result Date: 06/21/2024 CLINICAL DATA:  Left wrist pain after fall EXAM: LEFT WRIST - COMPLETE 3 VIEW COMPARISON:  Left wrist radiograph dated 04/28/2015 FINDINGS: There is no evidence of fracture or dislocation. Increased severe degenerative changes of the wrist with diffuse loss of joint space, sclerosis, and subchondral lucencies. Soft tissues are unremarkable. 4 mm linear radiodensity projecting over the palmar aspect of the index finger metacarpal base. Surgical clips project over the volar and dorsal distal forearm. IMPRESSION: 1. No acute fracture or dislocation. 2. Increased severe degenerative changes of the wrist. 3. A 4 mm linear radiodensity projecting over the palmar aspect of the index finger metacarpal base may represent a foreign body.  Electronically Signed   By: Limin  Xu M.D.   On: 06/21/2024 07:58    Assessment and Plan: 89M h/o L tonsillar sarcoma (s/p mandibulotomy, radical tonsillectomy, and RFFF), tobacco dependence, VTE x3 (on OAC, Xarelto , has been without meds for the past 2-3 months due to refill issues), and HTN who p/w syncope a week ago followed by pre-syncope sx today who was found to have AKI, and acute PE iso missing OAC for months.  AKI -MIVF: NS at 50cc/h for 24h -Strict I&Os and daily weights (standing preferred) -F/u BMP daily -Renally dose medications for CrCl -Avoid lovenox , NSAIDs, morphine , Fleet's phosphate enema, regular insulin, contrast; no gadolinium for MRI to avoid nephrogenic systemic fibrosis -Consider renal US  and nephrology consult if worsening AKI  Acute PE VTE x3 per pt; thus, will need lifelong OAC; no OAC for past 3 months -Xatrelto 15mg  BID for 3 weeks followed by 20mg  daily; please consider 1 year script at d/c or referral for new OP PCP at d/c vs hematologist who will provide longer [facility] prescription for pt  Syncope Likely related acute PE above, but will complete w/u below -PT/OT consulted; apprec eval/recs -Tele -MIVF: NS at 50cc/h for 24h -F/u orthostatic vitals -F/u admission blood cultures (low suspicion for bacteremia) -F/u EtOH level -F/u EEG; if abnl consult Neurology -F/u TTE to eval for structural/valvular heart disease -Consider 14d Holter monitor on discharge if no other obvious etiology  HTN -PTA amlodipine  5mg  daily   Advance Care Planning:   Code Status: Full Code   Consults: N/A  Family Communication: Spouse  Severity of Illness: The appropriate patient status for this patient is INPATIENT. Inpatient status is judged to be reasonable and necessary in order to provide the required intensity of service to ensure the patient's safety. The patient's presenting symptoms, physical exam findings, and initial radiographic and laboratory data in the context  of their chronic comorbidities is felt to place them at high risk for further clinical deterioration. Furthermore, it is not anticipated that the patient will be medically stable for discharge from the hospital within 2 midnights of admission.   * I certify that at the point of admission it is my clinical judgment that the patient will require inpatient hospital care spanning beyond 2 midnights from the point of admission due to high intensity of service, high risk for further deterioration and high frequency of surveillance required.*   ------- I spent 61 minutes reviewing previous notes, at the bedside counseling/discussing the treatment plan, and performing clinical documentation.  Author: Marsha Ada, MD 06/21/2024 1:04 PM  For on call review [email or web address].

## 2024-06-21 NOTE — ED Notes (Signed)
 Pt reports extensive history of blood clots, usually takes eliquis  but has not taken over a month dt needing new prescription. CP started x1wk ago especially when lying on sides, reports when sitting up pt has no CP. Pt reports leg swelling as well. Also fell 2wks ago and injured L hand, still having pain

## 2024-06-21 NOTE — ED Provider Notes (Signed)
 [place] EMERGENCY DEPARTMENT AT [facility] Provider Note   CSN: [id number] Arrival date & time: 06/21/24  9457     Patient presents with: Chest Pain and Hand Pain   Mitchell Cooper is a 63 y.o. male.    Chest Pain Hand Pain Associated symptoms include chest pain.   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  He presents emergency room today with complaints of chest pain that is been going on for approximately 1 week he states that hurts when he takes deep breaths feels similar to when he has had pulmonary emboli in the past.  Patient states that he also had an episode of syncope 1 week ago he states that he did not have much of a prodrome and woke up on the ground with left wrist pain.  He endorses a mild headache after waking up.  Today he felt lightheaded again and came to emergency room for reevaluation.       Prior to Admission medications   Medication Sig Start Date End Date Taking? Authorizing Provider  amLODipine  (NORVASC ) 5 MG tablet Take 1 tablet (5 mg total) by mouth daily. Please schedule an appointment for more refills. 06/17/24   Vicci Barnie NOVAK, MD  prochlorperazine  (COMPAZINE ) 10 MG tablet Take 1 tablet (10 mg total) by mouth every 6 (six) hours as needed (Nausea or vomiting). 10/02/20   Iruku, Praveena, MD  XARELTO  20 MG TABS tablet TAKE 1 TABLET (20 MG TOTAL) BY MOUTH DAILY WITH SUPPER. MUST KEEP APPOINTMENT IN [facility] WITH DR. VICCI 02/15/24   Celestia Rosaline SQUIBB, NP    Allergies: Patient has no known allergies.    Review of Systems  Cardiovascular:  Positive for chest pain.    Updated Vital Signs BP (!) 143/83 (BP Location: Left Arm)   Pulse 60   Temp 98.2 F (36.8 C)   Resp 19   Ht 5' 9 (1.753 m)   Wt 56.8 kg   SpO2 100%   BMI 18.50 kg/m   Physical Exam Vitals and  nursing note reviewed.  Constitutional:      General: He is not in acute distress. HENT:     Head: Normocephalic and atraumatic.     Nose: Nose normal.     Mouth/Throat:     Mouth: Mucous membranes are dry.  Eyes:     General: No scleral icterus. Cardiovascular:     Rate and Rhythm: Normal rate and regular rhythm.     Pulses: Normal pulses.     Heart sounds: Normal heart sounds.  Pulmonary:     Effort: Pulmonary effort is normal. No respiratory distress.     Breath sounds: No wheezing.  Abdominal:     Palpations: Abdomen is soft.     Tenderness: There is no abdominal tenderness. There is no guarding or rebound.  Musculoskeletal:     Cervical back: Normal range of motion.     Right lower leg: No edema.     Left lower leg: No edema.  Skin:    General: Skin is warm and dry.     Capillary Refill: Capillary refill takes less than 2 seconds.  Neurological:     Mental Status: He is alert. Mental status is at baseline.  Psychiatric:        Mood and Affect: Mood normal.  Behavior: Behavior normal.     (all labs ordered are listed, but only abnormal results are displayed) Labs Reviewed  CBC WITH DIFFERENTIAL/PLATELET - Abnormal; Notable for the following components:      Result Value   Lymphs Abs 0.6 (*)    All other components within normal limits  COMPREHENSIVE METABOLIC PANEL WITH GFR - Abnormal; Notable for the following components:   Glucose, Bld 214 (*)    Creatinine, Ser 1.44 (*)    Calcium 8.8 (*)    Albumin  3.0 (*)    GFR, Estimated 55 (*)    All other components within normal limits  D-DIMER, QUANTITATIVE - Abnormal; Notable for the following components:   D-Dimer, Quant 5.09 (*)    All other components within normal limits  CULTURE, BLOOD (ROUTINE X 2)  CULTURE, BLOOD (ROUTINE X 2)  HIV ANTIBODY (ROUTINE TESTING W REFLEX)  ETHANOL  TROPONIN I (HIGH SENSITIVITY)  TROPONIN I (HIGH SENSITIVITY)    EKG: EKG Interpretation Date/Time:  Thursday June 21 2024 05:54:16 EST Ventricular Rate:  80 PR Interval:  138 QRS Duration:  88 QT Interval:  358 QTC Calculation: 412 R Axis:   77  Text Interpretation: Sinus rhythm with occasional Premature ventricular complexes Nonspecific T wave abnormality Compared with prior EKG from 11/26/2019 Confirmed by Gennaro Bouchard ([id number]) on 06/21/2024 9:40:03 AM  Radiology: CT Angio Chest PE W/Cm &/Or Wo Cm Result Date: 06/21/2024 EXAM: CTA of the Chest with contrast for PE 06/21/2024 10:05:00 AM TECHNIQUE: CTA of the chest was performed without and with the administration of 75 mL of iohexol  (OMNIPAQUE ) 350 MG/ML injection. Multiplanar reformatted images are provided for review. MIP images are provided for review. Automated exposure control, iterative reconstruction, and/or weight based adjustment of the mA/kV was utilized to reduce the radiation dose to as low as reasonably achievable. COMPARISON: 11/03/2019 CLINICAL HISTORY: Pulmonary embolism (PE) suspected, high probability. FINDINGS: PULMONARY ARTERIES: Pulmonary arteries are adequately opacified for evaluation. At least 2 small peripheral filling defects are noted in lower lobe branches of the right pulmonary artery consistent with pulmonary emboli. Main pulmonary artery is normal in caliber. MEDIASTINUM: The heart and pericardium demonstrate no acute abnormality. There is no acute abnormality of the thoracic aorta. LYMPH NODES: No mediastinal, hilar or axillary lymphadenopathy. LUNGS AND PLEURA: Mild biapical scarring is noted. A 5 mm ground glass opacity is noted posteriorly in the right middle lobe best seen on image 86 of series 6. No focal consolidation or pulmonary edema. No pleural effusion or pneumothorax. UPPER ABDOMEN: Limited images of the upper abdomen are unremarkable. SOFT TISSUES AND BONES: No acute bone or soft tissue abnormality. IMPRESSION: 1. Small peripheral Pulmonary emboli in lower lobe branches of the right pulmonary artery. This finding was  discussed with Hamp Bow, PA, at 10:21 am on 06/21/2024. 2. Mild biapical scarring. 3. 5 mm ground-glass pulmonary nodule in the right middle lobe; follow-up unenhanced chest CT in 6 to 12 months is recommended to ensure stability. Electronically signed by: Lynwood Seip MD 06/21/2024 10:22 AM EST RP Workstation: [id number]   DG Wrist Complete Left Result Date: 06/21/2024 CLINICAL DATA:  Left wrist pain after fall EXAM: LEFT WRIST - COMPLETE 3 VIEW COMPARISON:  Left wrist radiograph dated 04/28/2015 FINDINGS: There is no evidence of fracture or dislocation. Increased severe degenerative changes of the wrist with diffuse loss of joint space, sclerosis, and subchondral lucencies. Soft tissues are unremarkable. 4 mm linear radiodensity projecting over the palmar aspect of the index finger metacarpal base. Surgical  clips project over the volar and dorsal distal forearm. IMPRESSION: 1. No acute fracture or dislocation. 2. Increased severe degenerative changes of the wrist. 3. A 4 mm linear radiodensity projecting over the palmar aspect of the index finger metacarpal base may represent a foreign body. Electronically Signed   By: Limin  Xu M.D.   On: 06/21/2024 07:58     .Critical Care  Performed by: Neldon Hamp RAMAN, PA Authorized by: Neldon Hamp RAMAN, PA   Critical care provider statement:    Critical care time (minutes):  35   Critical care time was exclusive of:  Separately billable procedures and treating other patients and teaching time   Critical care was necessary to treat or prevent imminent or life-threatening deterioration of the following conditions: Syncope, pulmonary emboli.   Critical care was time spent personally by me on the following activities:  Development of treatment plan with patient or surrogate, review of old charts, re-evaluation of patient's condition, pulse oximetry, ordering and review of radiographic studies, ordering and review of laboratory studies, ordering and performing  treatments and interventions, obtaining history from patient or surrogate, examination of patient and evaluation of patient's response to treatment   Care discussed with: admitting provider      Medications Ordered in the ED  amLODipine  (NORVASC ) tablet 5 mg (5 mg Oral Given 06/21/24 1259)  Rivaroxaban  (XARELTO ) tablet 15 mg (15 mg Oral Given 06/21/24 1259)  oxyCODONE -acetaminophen  (PERCOCET/ROXICET) 5-325 MG per tablet 1 tablet (1 tablet Oral Given 06/21/24 1259)  0.9 %  sodium chloride  infusion ( Intravenous Stopped 06/21/24 1524)  pneumococcal 20-valent conjugate vaccine (PREVNAR 20 ) injection 0.5 mL (has no administration in time range)  oxyCODONE -acetaminophen  (PERCOCET/ROXICET) 5-325 MG per tablet 1 tablet (1 tablet Oral Given 06/21/24 0750)  iohexol  (OMNIPAQUE ) 350 MG/ML injection 75 mL (75 mLs Intravenous Contrast Given 06/21/24 1001)    Clinical Course as of 06/21/24 1556  Thu Jun 21, 2024  1020 Small peripheral PE-s  [WF]  1041 Echo in 2021 without reduced EF [WF]    Clinical Course User Index [WF] Neldon Hamp RAMAN, PA                                 Medical Decision Making Amount and/or Complexity of Data Reviewed Radiology: ordered.  Risk Prescription drug management. Decision regarding hospitalization.   This patient presents to the ED for concern of CP, this involves a number of treatment options, and is a complaint that carries with it a moderate risk of complications and morbidity. A differential diagnosis was considered for the patient's symptoms which is discussed below:   The emergent causes of chest pain include: Acute coronary syndrome, tamponade, pericarditis/myocarditis, aortic dissection, pulmonary embolism, tension pneumothorax, pneumonia, and esophageal rupture.  The differential diagnosis for syncope includes (but is not limited to) anemia, hypoglycemia, arrhythmia, seizure.  If associated with pain loss of consciousness can certainly also include  subarachnoid hemorrhage, thoracic aortic dissection, PE, ectopic pregnancy, AAA.  Also with considering is a vasovagal reaction, hypovolemia/dehydration.    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  He presents emergency room today with complaints of chest pain that is been going on for approximately 1 week he states that hurts when  he takes deep breaths feels similar to when he has had pulmonary emboli in the past.  Patient states that he also had an episode of syncope 1 week ago he states that he did not have much of a prodrome and woke up on the ground with left wrist pain.  He endorses a mild headache after waking up.  Today he felt lightheaded again and came to emergency room for reevaluation.     EMR reviewed including pt PMHx, past surgical history and past visits to ER.   See HPI for more details   Lab Tests:  I ordered and independently interpreted labs. Labs notable for Creat elevated  Dimer elevated   Imaging Studies:  Abnormal findings. I personally reviewed all imaging studies. Imaging notable for Small incidental PE s    Cardiac Monitoring:  The patient was maintained on a cardiac monitor.  I personally viewed and interpreted the cardiac monitored which showed an underlying rhythm of: NSR EKG non-ischemic   Medicines ordered:  I ordered medication including amlodipine , Percocet, Xarelto   Reevaluation of the patient after these medicines showed that the patient stayed the same I have reviewed the patients home medicines and have made adjustments as needed   Critical Interventions:     Consults/Attending Physician   I discussed this case with my attending physician who cosigned this note including patient's presenting symptoms, physical  exam, and planned diagnostics and interventions. Attending physician stated agreement with plan or made changes to plan which were implemented.  Discussed with Dr. Georgina who will admit  Reevaluation:  After the interventions noted above I re-evaluated patient and found that they have :stayed the same   Social Determinants of Health:      Problem List / ED Course:  Pulmonary emboli likely incidental but patient is on anticoagulation from prior pulmonary emboli more concerning, his nonprodromal syncope.  Does not have a recent echocardiogram.  Will admit for telemetry and monitoring   Dispostion:  After consideration of the diagnostic results and the patients response to treatment, I feel that the patent would benefit from admission     Final diagnoses:  Syncope, unspecified syncope type  Other acute pulmonary embolism without acute cor pulmonale [facility])    ED Discharge Orders     None          Neldon Hamp RAMAN, [place] 06/21/24 1603    Gennaro Duwaine CROME, DO 06/22/24 [phone]

## 2024-06-21 NOTE — ED Notes (Signed)
 CCMD called by this RN

## 2024-06-21 NOTE — Plan of Care (Signed)

## 2024-06-21 NOTE — Progress Notes (Signed)
  Echocardiogram 2D Echocardiogram has been performed.  Norleen ORN Maille Halliwell 06/21/2024, 3:21 PM

## 2024-06-21 NOTE — ED Notes (Signed)
 Phlebotomy to collect outstanding labs.

## 2024-06-21 NOTE — ED Triage Notes (Signed)
 POV/ ambulatory/ c/o CP and SHOB x1 day/ has not been taking blood thinner x1 month/ hx of DVT and PE/ pt states he is having trouble eating d/t SHOB/ pt states he fell last night injuring left hand/ pt is A&Ox4

## 2024-06-22 ENCOUNTER — Observation Stay (HOSPITAL_COMMUNITY)

## 2024-06-22 DIAGNOSIS — R55 Syncope and collapse: Secondary | ICD-10-CM

## 2024-06-22 LAB — COMPREHENSIVE METABOLIC PANEL WITH GFR
ALT: 11 U/L (ref 0–44)
AST: 19 U/L (ref 15–41)
Albumin: 2.8 g/dL — ABNORMAL LOW (ref 3.5–5.0)
Alkaline Phosphatase: 57 U/L (ref 38–126)
Anion gap: 12 (ref 5–15)
BUN: 13 mg/dL (ref 8–23)
CO2: 27 mmol/L (ref 22–32)
Calcium: 8.7 mg/dL — ABNORMAL LOW (ref 8.9–10.3)
Chloride: 100 mmol/L (ref 98–111)
Creatinine, Ser: 1.04 mg/dL (ref 0.61–1.24)
GFR, Estimated: >60 mL/min (ref >60)
Glucose, Bld: 94 mg/dL (ref 70–99)
Potassium: 3.9 mmol/L (ref 3.5–5.1)
Sodium: 139 mmol/L (ref 135–145)
Total Bilirubin: 0.7 mg/dL (ref 0.0–1.2)
Total Protein: 6.9 g/dL (ref 6.5–8.1)

## 2024-06-22 LAB — CBC WITH DIFFERENTIAL/PLATELET
Abs Immature Granulocytes: 0.04 K/uL (ref 0.00–0.07)
Basophils Absolute: 0 K/uL (ref 0.0–0.1)
Basophils Relative: 0 %
Eosinophils Absolute: 0.2 K/uL (ref 0.0–0.5)
Eosinophils Relative: 2 %
HCT: 39.2 % (ref 39.0–52.0)
Hemoglobin: 13.6 g/dL (ref 13.0–17.0)
Immature Granulocytes: 0 %
Lymphocytes Relative: 8 %
Lymphs Abs: 0.8 K/uL (ref 0.7–4.0)
MCH: 30.2 pg (ref 26.0–34.0)
MCHC: 34.7 g/dL (ref 30.0–36.0)
MCV: 87.1 fL (ref 80.0–100.0)
Monocytes Absolute: 0.9 K/uL (ref 0.1–1.0)
Monocytes Relative: 10 %
Neutro Abs: 7.5 K/uL (ref 1.7–7.7)
Neutrophils Relative %: 80 %
Platelets: 213 K/uL (ref 150–400)
RBC: 4.5 MIL/uL (ref 4.22–5.81)
RDW: 15.1 % (ref 11.5–15.5)
WBC: 9.5 K/uL (ref 4.0–10.5)
nRBC: 0 % (ref 0.0–0.2)

## 2024-06-22 MED ORDER — ACETAMINOPHEN 500 MG PO TABS
1000.0000 mg | ORAL_TABLET | ORAL | Status: DC
  Administered 2024-06-22 – 2024-06-23 (×4): 1000 mg via ORAL
  Filled 2024-06-22 (×5): qty 2

## 2024-06-22 MED ORDER — OXYCODONE HCL 5 MG PO TABS: 5.0000 mg | ORAL_TABLET | ORAL | Status: DC | PRN

## 2024-06-22 MED ORDER — RIVAROXABAN 15 MG PO TABS
15.0000 mg | ORAL_TABLET | Freq: Two times a day (BID) | ORAL | Status: DC
  Administered 2024-06-22 – 2024-06-23 (×2): 15 mg via ORAL
  Filled 2024-06-22 (×2): qty 1

## 2024-06-22 MED ORDER — ORAL CARE MOUTH RINSE: 15.0000 mL | OROMUCOSAL | Status: DC | PRN

## 2024-06-22 MED ORDER — RIVAROXABAN 20 MG PO TABS: 20.0000 mg | ORAL_TABLET | Freq: Every day | ORAL | Status: DC

## 2024-06-22 NOTE — Progress Notes (Signed)
 OT Cancellation Note  Patient Details Name: Mitchell Cooper MRN: [id number] DOB: 08/04/1960   Cancelled Treatment:    Reason Eval/Treat Not Completed: OT screened, no needs identified, will sign off  Leita Howell, OTR/L,CBIS  Supplemental OT - [facility] and [facility] Secure Chat Preferred   06/22/2024, 10:45 AM

## 2024-06-22 NOTE — Discharge Instructions (Signed)
 Rent/Utility Assistance in [facility]:  [employer] [address], [place], [place] [address] [phone] Mon 8:00am - 6:00pm; Tue 8:00am - 6:00pm; Wed 8:00am - 6:00pm; Thu 8:00am - 6:00pm; Fri 8:00am - 6:00pm; Email: [email or web address] Eligibility: Residents of [place], [place], [place], [place], [place] and [place] that meet income limits. Call or text for eligibility screening.   [facility] [address] [place], [place], [place] [address] [phone] (Main: Rental Assistance) [phone] (Main: Utility Assistance) Mon 8:30am - 5:00pm; Tue 8:30am - 5:00pm; Wed 8:30am - 5:00pm; Thu 8:30am - 5:00pm; Fri 8:30am - 5:00pm; Website: http://www.[email or web address]/emergency-assistance-program Eligibility: People who have an unexpected crisis or emergency that can be verified. Must have some form of income and meet income limits. At the first of the month, only helps with rent/mortgage assistance for those who have court ordered eviction notices. Call for application information. Call for exact documents that will be needed. Examples of documents that may be needed: Photo ID, Social Security cards for everyone in the household, and proof of income for previous 2 months. Copy of eviction notice for rent assistance and copy of final notice for utility assistance. Statements or receipts of bills for previous 2 months.   [employer] - [place] [address], [place], [place] [address] [phone] (Main) [phone] (Alternate) Mon 9:00am - 5:00pm; Tue 9:00am - 5:00pm; Wed 9:00am - 5:00pm; Thu 9:00am - 5:00pm; Fri 9:00am - 5:00pm; Website: [email or web address].[email or web address]/emergency-financial-assistance Email: [email or web address] Eligibility: People experiencing a housing crisis with past-due rent and/or utilities and meet income limits. Must be willing to take part in 6 Call or  visit website to download application. Return complete application by mail or email only. Documents: Help with Utilities: Photo ID, proof of household income, copies of monthly bills or receipts, and a final disconnection/shut-off notice. Help with Rent or Mortgage: Photo ID, proof of income, copies of monthly bills or receipts, and eviction notice. Help with Household Goods: Photo ID, proof of household income, copies of monthly bills or receipts, and a fire or flood report.  [employer] [facility] [place] [address], [place], [place] [address] [phone] (Main) Mon 8:00am - 5:00pm; Tue 8:00am - 5:00pm; Wed 8:00am - 5:00pm; Thu 8:00am - 5:00pm; Fri 8:00am - 12:00pm; Website: http://[email or web address].salvationarmy.org/[place]/emergency-financial-assistance Email: [email or web address] Call for eligibility information. Apply :Utilities Assistance: Visit office by 8:30am on 1st and 4th Monday of each month to pick up application. Rent and Mortgage Assistance: Visit office by 8:30am on 2nd and 3rd Monday of each month to pick up application. NOTE: If Monday falls on a holiday applications can be picked up the following Tuesday. Documents required will be listed on application.  [facility] -  [phone] (Main) Seen by appointment only. Call for more information. Eligibility: Meet income limits. Apply: Call for information on how to schedule an appointment. Each month there is a specific day to call to schedule an appointment. It is stated on the agency voicemail message. Appointments fill up quickly each month. Documents: Photo ID, copy of current utility bill.  [facility] [place] [address] [place], [place] [address] [phone] (Main) Tue 9:00am - 4:00pm; Wed 9:00am - 4:00pm; Thu 9:00am - 4:00pm; Website: http://[email or web address].[email or web address] Email: [email or web address] [email or web address] Eligibility: Utility Assistance: Meet  income limits and be a Holiday representative. [employer] customers do not qualify. Must not have received utility assistance for another agency within the last 90 days. Rent Assistance: Residents of [employer]  who meet income limits. Must not have received rent assistance for another agency within the last 90 days. Apply: Call to schedule an appointment. Documents: Utility Assistance: Photo ID, [place] of [employer], copy of lease (if not paying a mortgage), proof of income, and monthly expenses. Rent Assistance: Photo ID, W-9 from the landlord, copy of the lease, proof of income, and a list of monthly expenses.  [employer] [facility] [place] [address] [place], [place] [address] [phone] (Main: Help With Rent) [phone] (Main: Help With Utilities) Mon 9:00am - 4:00pm; Tue 9:00am - 4:00pm; Wed 9:00am - 4:00pm; Thu 9:00am - 4:00pm; Fri 9:00am - 4:00pm; Website: [email or web address].no Email: [email or web address] Eligibility: People experiencing a financial crisis. Apply: Call to schedule an appointment Wednesday, 7:30am. Documents: Photo ID, Social Security card, proof of income, and proof of address. Other documents may be required, depending on service. Call for more information.  [employer] DE[facility]MENT [facility]IAL [facility] - [facility] [address] [place], [place] [address] [phone] (Main) Mon 8:00am - 5:00pm; Tue 8:00am - 5:00pm; Wed 8:00am - 5:00pm; Thu 8:00am - 5:00pm; Fri 8:00am - 5:00pm; Website: [email or web address] Eligibility: Meet income limits and resource guidelines. Each household is only eligible once, even if multiple members apply. Apply: Call to see if funds are available. Visit to complete an application, call to have 1 mailed, or apply online at [email or web address] NOTE:  Households with a person age 62 and over or a person with a documented disability can apply beginning December 1. Other households can apply beginning January 1. Documents: Photo ID, birth certificate, proof of household income, copy of utility bill, latest bank statement, the names and Social Security numbers for everyone in the household, and proof of disability if under age 26.  [employer] DEPARTMENT OF SOCIAL SERVICES - [facility] [address] [place], [place], [place] [address] [phone] (Main) Mon 8:00am - 5:00pm; Tue 8:00am - 5:00pm; Wed 8:00am - 5:00pm; Thu 8:00am - 5:00pm; Fri 8:00am - 5:00pm; Website: [email or web address] Eligibility: Meet income limits and resource guidelines. Each household is only eligible once, even if multiple members apply. Apply: Call to see if funds are available. Visit to complete an application, call to have 1 mailed, or apply online at [email or web address] NOTE: Households with a person age 45 and over or a person with a documented disability can apply beginning December 1. Other households can apply beginning January 1. Documents: Photo ID, birth certificate, proof of household income, copy of utility bill, latest bank statement, the names and Social Security numbers for everyone in the household, and proof of disability if under age 37.

## 2024-06-22 NOTE — Evaluation (Signed)
 Physical Therapy Brief Evaluation and Discharge Note Patient Details Name: Mitchell Cooper MRN: [id number] DOB: [birth year] Today's Date: 06/22/2024   History of Present Illness  63 yo M adm 06/21/24 with pre syncope, AKI, small peripheral PE, Lt wrist pain (- fx). 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 Pt also states Lt wrist pain since that time but using functionally this date. Pt at baseline functional status able to walk hall and perform stairs which he reports he could barely get around in house PTA. No further therapy needs at this time.   Supine 121/84 (95) Sitting 130/88 (100) Standing 142/103 (117) Standing 3 min 179/108 (127) SPO2 >97% on RA       PT Assessment Patient does not need any further PT services  Assistance Needed at Discharge  PRN    Equipment Recommendations None recommended by PT  Recommendations for Other Services       Precautions/Restrictions Precautions Precautions: None        Mobility  Bed Mobility Rolling: Modified independent (Device/Increase time)        Transfers Overall transfer level: Modified independent                      Ambulation/Gait Ambulation/Gait assistance: Independent Gait Distance (Feet): 300 Feet Assistive device: None Gait Pattern/deviations: WFL(Within Functional Limits) Gait Speed: Pace WFL    Home Activity Instructions    Stairs Stairs: Yes Stairs assistance: Modified independent (Device/Increase time) Stair Management: Alternating pattern, Forwards, One rail Right Number of Stairs: 4    Modified Rankin (Stroke Patients Only)        Balance Overall balance assessment: No apparent balance deficits (not formally assessed)                        Pertinent Vitals/Pain PT - Brief Vital  Signs All Vital Signs Stable: Yes (SPO2 >97% RA) Pain Assessment Pain Assessment: 0-10 Pain Score: 3  Pain Location: lt wrist Pain Descriptors / Indicators: Aching Pain Intervention(s): Limited activity within patient's tolerance, Repositioned     Home Living Family/patient expects to be discharged to:: Private residence Living Arrangements: Spouse/significant other Available Help at Discharge: Family;Available PRN/intermittently Home Environment: Stairs to enter  Stairs-Number of Steps: 4 Home Equipment: None        Prior Function Level of Independence: Independent      UE/LE Assessment   UE ROM/Strength/Tone/Coordination: WFL    LE ROM/Strength/Tone/Coordination: [facility]      Communication   Communication Communication: No apparent difficulties     Cognition Overall Cognitive Status: Appears within functional limits for tasks assessed/performed       General Comments      Exercises     Assessment/Plan    PT Problem List         PT Visit Diagnosis Other abnormalities of gait and mobility (R26.89)    No Skilled PT All education completed;Patient is independent with all acitivity/mobility;Patient at baseline level of functioning   Co-evaluation                AMPAC 6 Clicks Help needed turning from your back to your side while in a flat bed without using bedrails?: None Help needed moving from lying on your back to sitting on the side of a flat bed without using bedrails?: None Help needed moving  to and from a bed to a chair (including a wheelchair)?: None Help needed standing up from a chair using your arms (e.g., wheelchair or bedside chair)?: None Help needed to walk in hospital room?: None Help needed climbing 3-5 steps with a railing? : None 6 Click Score: 24      End of Session   Activity Tolerance: Patient tolerated treatment well Patient left: in chair;with call bell/phone within reach Nurse Communication: Mobility status PT Visit  Diagnosis: Other abnormalities of gait and mobility (R26.89)     Time: 1029-1050 PT Time Calculation (min) (ACUTE ONLY): 21 min  Charges:   PT Evaluation $PT Eval Low Complexity: 1 Low      Keyuna Cuthrell P, PT Acute Rehabilitation Services Office: [phone]   Lenoard NOVAK Jandy Brackens  06/22/2024, 1:31 PM

## 2024-06-22 NOTE — Plan of Care (Signed)
   Problem: Education: Goal: Knowledge of General Education information will improve Description: Including pain rating scale, medication(s)/side effects and non-pharmacologic comfort measures Outcome: Progressing   Problem: Clinical Measurements: Goal: Ability to maintain clinical measurements within normal limits will improve Outcome: Progressing

## 2024-06-22 NOTE — Progress Notes (Signed)
 [facility]   ROUNDING   NOTE BYREN PANKOW FMW:[id number]  DOB: 1961/04/22  DOA: 06/21/2024  PCP: Vicci Barnie NOVAK, MD  06/22/2024,10:05 AM  LOS: 0 days    Code Status: Full code     from: Home   63 year old male home daily 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 status post surgery 05/04/2021-complication neck dehiscence 06/14/2021-had a trach managed for strep anginosus MSSA at the time--under management-[facility] Tobacco dependence Prior admission in 2017 for mild PE, readmitted 10/2019 high risk submassive PE status post systemic lytics supposed (noncompliant to DOAC)    Pertinent imaging/studies till date  12/4 presented to ED not taking blood thinners X 1 month trouble eating short of breath and fell CTA PE protocol showed small peripheral pulmonary emboli lower lobe branches pulmonary artery Also found to have AKI creatinine of 1.4 Workup ensued for syncope inclusive blood culture, TTE, ethanol level 12/5 TTE ordered but pending   Assessment  & Plan :    Multifactorial syncope in setting of PE --- large contribution likely from AKI Ambulate patient in hallway today suspect AKI playing a large role Orthostatic vital signs to be done and to be documented-patient to be ambulated in hallway today Echo pending at this time-cancel EEG-do not think will need 14-day Holter ---keep on telemetry overnight and then can reevaluate if there is a further need  AKI on admission Much improved-saline lock IV fluid-labs in a.m.  Recurrent pulmonary emboli Counseled patient and will need to ensure that he has access to DOAC ongoing- will prescribed today 1 year supply and check with pharmacy to ensure is affordable Continues on Xarelto  load 15 twice daily and then after 21 days a dose of 20 daily  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 Vinie at [facility]  Tobacco dependence Needs to quit smoking long-term   Data  Reviewed today:  Sodium 139 potassium 3.9 BUN/creatinine 13/1.01 WBC 9.5 hemoglobin 13.6 platelet 214 H IV nonreactive Ethanol level less than 15 blood culture X2 Growth to date  DVT prophylaxis: On Xarelto   Status is: Observation The patient remains OBS appropriate and will d/c before 2 midnights.    Dispo/Global plan: Likely discharge home once stable in the next 24 to 48 hours  Time 45   Subjective:   Looks well no fever no chills Tells me he did not take his blood thinner for several months as needed to follow-up with PCP He is not dizzy when he sits up in the bed He has no chest pain    Objective + exam Vitals:   06/21/24 1526 06/21/24 2019 06/22/24 0432 06/22/24 0832  BP: (!) 143/83 118/87 (!) 136/90 137/87  Pulse: 60 68 73 67  Resp: 19 19 19 16   Temp: 98.2 F (36.8 C) 97.7 F (36.5 C) 97.6 F (36.4 C) 97.9 F (36.6 C)  TempSrc:      SpO2: 100% 99% 100% 100%  Weight:      Height:       Filed Weights   06/21/24 0553 06/21/24 0654 06/21/24 1522  Weight: 64 kg 64 kg 56.8 kg     Examination: EOMI NCAT frail black male no distress Chest clear JVD elevated S1-S2 no murmur Abdomen soft no rebound no guarding No lower extremity edema     Scheduled Meds:  amLODipine   5 mg Oral Daily   pneumococcal 20-valent conjugate vaccine  0.5 mL Intramuscular Tomorrow-1000   rivaroxaban   15 mg Oral BID  Followed by   NOREEN ON 07/13/2024] rivaroxaban   20 mg Oral Q supper   Continuous Infusions:   oxyCODONE -acetaminophen   Jai-Gurmukh Braylen Staller, MD  [facility]

## 2024-06-22 NOTE — TOC Initial Note (Signed)
 Transition of Care [facility]) - Initial/Assessment Note    Patient Details  Name: Mitchell Cooper MRN: [id number] Date of Birth: Apr 19, 1961  [facility]) CM/SW Contact:    Lendia Dais, LCSWA Phone Number: 06/22/2024, 3:19 PM  Clinical Narrative: Pt is from home w/ spouse and reports they have DME of a suction machine, indep with ADL's, and drives. Pt gave CSW permission to contact spouse Jon.  Pt reports that his home is being renovated and the floors are being dugged up at it may take a few weeks. Pt and their wife plan on staying an hotel and have family in the area. CSW asked if they would like utility/rent resources, pt was agreeable.  Pt has no SDOH concerns and income is disability and pt states they are able to afford basic needs.   Pt has hx of HH (2 yrs ago), no hx of STR, takes meds, and has seen PCP in the last year.   TOC will continue to monitor for discharge.           Expected Discharge Plan: Home/Self Care Barriers to Discharge: Continued Medical Work up   Patient Goals and CMS Choice Patient states their goals for this hospitalization and ongoing recovery are:: Continue to be healthy   Choice offered to / list presented to : NA      Expected Discharge Plan and Services In-house Referral: Clinical Social Work     Living arrangements for the past 2 months: Single Family Home                                      Prior Living Arrangements/Services Living arrangements for the past 2 months: Single Family Home Lives with:: Spouse Patient language and need for interpreter reviewed:: Yes Do you feel safe going back to the place where you live?: Yes      Need for Family Participation in Patient Care: No (Comment) Care giver support system in place?: Yes (comment) Current home services: DME Criminal Activity/Legal Involvement Pertinent to Current Situation/Hospitalization: No - Comment as needed  Activities of Daily Living   ADL  Screening (condition at time of admission) Independently performs ADLs?: Yes (appropriate for developmental age) Is the patient deaf or have difficulty hearing?: No Does the patient have difficulty seeing, even when wearing glasses/contacts?: No Does the patient have difficulty concentrating, remembering, or making decisions?: No  Permission Sought/Granted Permission sought to share information with : Family Supports Permission granted to share information with : Yes, Verbal Permission Granted  Share Information with NAME: Jon Doss     Permission granted to share info w Relationship: Spouse  Permission granted to share info w Contact Information: [phone]  Emotional Assessment Appearance:: Appears stated age Attitude/Demeanor/Rapport: Engaged Affect (typically observed): Appropriate, Pleasant Orientation: : Oriented to Self, Oriented to Place, Oriented to  Time, Oriented to Situation Alcohol / Substance Use: Not Applicable Psych Involvement: No (comment)  Admission diagnosis:  Syncope [R55] Syncope, unspecified syncope type [R55] Other acute pulmonary embolism without acute cor pulmonale (HCC) [I26.99] Patient Active Problem List   Diagnosis Date Noted   Syncope 06/21/2024   Malnutrition 09/01/2022   Thrombophilia 09/01/2022   AKI (acute kidney injury) 02/18/2021   Cancer-related pain 01/29/2021   Port-A-Cath in place 11/20/2020   Weight loss, unintentional 10/23/2020   Mucositis due to antineoplastic therapy 10/23/2020   Dental caries    Chronic periodontitis  Carcinoma of tonsillar fossa (HCC) 09/12/2020   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 [place] [address] Phone: [phone] Fax: [phone]     Social Drivers of Health (SDOH) Social History: SDOH Screenings   Food Insecurity: No Food Insecurity (06/21/2024)  Housing: High Risk (06/21/2024)  Transportation Needs: No Transportation Needs (06/21/2024)  Utilities: Not At Risk (06/21/2024)  Depression (PHQ2-9): Low Risk  (12/14/2023)  Financial Resource Strain: Low Risk  (09/17/2020)  Physical Activity: Inactive (11/26/2019)  Social Connections: Socially Integrated (09/17/2020)  Stress: No Stress Concern Present (09/17/2020)  Tobacco Use: High Risk (06/21/2024)   SDOH Interventions:     Readmission Risk Interventions     No data to display

## 2024-06-23 ENCOUNTER — Other Ambulatory Visit (HOSPITAL_COMMUNITY): Payer: Self-pay

## 2024-06-23 DIAGNOSIS — R55 Syncope and collapse: Secondary | ICD-10-CM | POA: Diagnosis not present

## 2024-06-23 MED ORDER — RIVAROXABAN (XARELTO) VTE STARTER PACK (15 & 20 MG)
ORAL_TABLET | ORAL | 12 refills | Status: AC
  Filled 2024-06-23: qty 51, 30d supply, fill #0

## 2024-06-23 NOTE — Discharge Summary (Signed)
 Physician Discharge Summary  Mitchell Cooper FMW:[id number] DOB: 1961/03/22 DOA: 06/21/2024  PCP: Vicci Barnie NOVAK, MD  Admit date: 06/21/2024 Discharge date: 06/23/2024  Time spent: 20 minutes  Recommendations for Outpatient Follow-up:  Needs unrestricted access to DOAC going forward have refilled all meds at [facility] pharmacy Get Chem-12 CBC in the outpatient setting in 1 week  Discharge Diagnoses:  MAIN problem for hospitalization   Syncope in the setting of PE  Please see below for itemized issues addressed in HOpsital- refer to other progress notes for clarity if needed  Discharge Condition: Improved  Diet recommendation: Heart healthy  Filed Weights   06/21/24 0553 06/21/24 0654 06/21/24 1522  Weight: 64 kg 64 kg 56.8 kg    History of present illness:  63 year old male home daily 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 status post surgery 05/04/2021-complication neck dehiscence 06/14/2021-had a trach managed for strep anginosus MSSA at the time--under management-[facility] Tobacco dependence Prior admission in 2017 for mild PE, readmitted 10/2019 high risk submassive PE status post systemic lytics supposed (noncompliant to DOAC)     Pertinent imaging/studies till date  12/4 presented to ED not taking blood thinners X 1 month trouble eating short of breath and fell CTA PE protocol showed small peripheral pulmonary emboli lower lobe branches pulmonary artery Also found to have AKI creatinine of 1.4 Workup ensued for syncope inclusive blood culture, TTE, ethanol level 12/5 TTE EF 65-70% right ventricular systolic function is normal normal pulmonary arterial pressure    Assessment  & Plan :      Multifactorial syncope in setting of PE --- large contribution likely from AKI and orthostatic hypotension but this is resolved during hospitalization Ambulate patient in hallway today suspect AKI playing a large role which is now  resolved Echo normal does not need EEG does not need long-term monitors Syncope likely secondary to mild orthostasis in addition to AKI which was seen and have encouraged him to get up slowly and ambulate slowly in the hallways   AKI on admission Much improved-saline lock IV fluid-labs show resolution   Recurrent pulmonary emboli Counseled patient and will need to ensure that he has access to DOAC ongoing- will prescribed today 1 year supply and check with pharmacy to ensure is affordable Continues on Xarelto  load 15 twice daily and then after 21 days a dose of 20 daily-started pack has been ordered to [facility] pharmacy with 12 months refills   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 Vinie at [facility]   Tobacco dependence Needs to quit smoking long-term    Discharge Exam: Vitals:   06/23/24 0656 06/23/24 0847  BP: (!) 125/93 127/79  Pulse: 68 71  Resp: 19   Temp: 97.6 F (36.4 C) 98 F (36.7 C)  SpO2: 100% 100%    Subj on day of d/c   Awake alert pleasant walking the hallways  General Exam on discharge  EOMI NCAT no focal deficit no icterus no pallor no wheeze no rales no rhonchi CTAB no added sound Frail cachectic No lower extremity edema ROM intact   Discharge Instructions   Discharge Instructions     Diet - low sodium heart healthy   Complete by: As directed    Discharge instructions   Complete by: As directed    Please make sure that you take to Xarelto  we have called into our transition of care pharmacy and you will get all of the anticoagulants going forward from  here and I have prescribed 1 year supply You can use Tylenol  for pain Please quit smoking   Increase activity slowly   Complete by: As directed       Allergies as of 06/23/2024   No Known Allergies      Medication List     STOP taking these medications    Xarelto  20 MG Tabs tablet Generic drug: rivaroxaban  Replaced by: Rivaroxaban  Starter Pack (15 mg  and 20 mg)       TAKE these medications    acetaminophen  500 MG tablet Commonly known as: TYLENOL  Take 1,000 mg by mouth every 6 (six) hours as needed for moderate pain (pain score 4-6).   amLODipine  5 MG tablet Commonly known as: NORVASC  Take 1 tablet (5 mg total) by mouth daily. Please schedule an appointment for more refills.   Rivaroxaban  Starter Pack (15 mg and 20 mg) Commonly known as: XARELTO  STARTER PACK Follow package directions: Take one 15mg  tablet by mouth twice a day. On day 22, switch to one 20mg  tablet once a day. Take with food. Replaces: Xarelto  20 MG Tabs tablet       No Known Allergies    The results of significant diagnostics from this hospitalization (including imaging, microbiology, ancillary and laboratory) are listed below for reference.    Significant Diagnostic Studies: ECHOCARDIOGRAM COMPLETE BUBBLE STUDY Result Date: 06/21/2024    ECHOCARDIOGRAM REPORT   Patient Name:   Mitchell Cooper Date of Exam: 06/21/2024 Medical Rec #:  [id number]         Height:       69.0 in Accession #:    [id number]        Weight:       141.0 lb Date of Birth:  1961/05/17        BSA:          1.781 m Patient Age:    63 years          BP:           111/85 mmHg Patient Gender: M                 HR:           58 bpm. Exam Location:  Inpatient Procedure: 2D Echo and Saline Contrast Bubble Study (Both Spectral and Color            Flow Doppler were utilized during procedure). Indications:    Syncope  History:        Patient has prior history of Echocardiogram examinations.                 Signs/Symptoms:Syncope.  Sonographer:    Norleen Amour Referring Phys: [id number] MARSHA ADA IMPRESSIONS  1. Left ventricular ejection fraction, by estimation, is 65 to 70%. The left ventricle has normal function. The left ventricle has no regional wall motion abnormalities. Left ventricular diastolic parameters were normal.  2. Right ventricular systolic function is normal. The right ventricular size  is normal. There is normal pulmonary artery systolic pressure.  3. The mitral valve is normal in structure. No evidence of mitral valve regurgitation. No evidence of mitral stenosis.  4. The aortic valve is normal in structure. Aortic valve regurgitation is not visualized. Aortic valve sclerosis/calcification is present, without any evidence of aortic stenosis.  5. The inferior vena cava is normal in size with greater than 50% respiratory variability, suggesting right atrial pressure of 3 mmHg.  6. Agitated saline contrast bubble study was negative, with  no evidence of any interatrial shunt. FINDINGS  Left Ventricle: Left ventricular ejection fraction, by estimation, is 65 to 70%. The left ventricle has normal function. The left ventricle has no regional wall motion abnormalities. The left ventricular internal cavity size was normal in size. There is  no left ventricular hypertrophy. Left ventricular diastolic parameters were normal. Right Ventricle: The right ventricular size is normal. No increase in right ventricular wall thickness. Right ventricular systolic function is normal. There is normal pulmonary artery systolic pressure. The tricuspid regurgitant velocity is 2.40 m/s, and  with an assumed right atrial pressure of 3 mmHg, the estimated right ventricular systolic pressure is 26.0 mmHg. Left Atrium: Left atrial size was normal in size. Right Atrium: Right atrial size was normal in size. Pericardium: There is no evidence of pericardial effusion. Mitral Valve: The mitral valve is normal in structure. No evidence of mitral valve regurgitation. No evidence of mitral valve stenosis. MV peak gradient, 1.3 mmHg. The mean mitral valve gradient is 0.0 mmHg. Tricuspid Valve: The tricuspid valve is normal in structure. Tricuspid valve regurgitation is mild . No evidence of tricuspid stenosis. Aortic Valve: The aortic valve is normal in structure. Aortic valve regurgitation is not visualized. Aortic valve  sclerosis/calcification is present, without any evidence of aortic stenosis. Aortic valve mean gradient measures 2.0 mmHg. Aortic valve peak  gradient measures 4.1 mmHg. Aortic valve area, by VTI measures 2.32 cm. Pulmonic Valve: The pulmonic valve was normal in structure. Pulmonic valve regurgitation is not visualized. No evidence of pulmonic stenosis. Aorta: The aortic root is normal in size and structure. Venous: The inferior vena cava is normal in size with greater than 50% respiratory variability, suggesting right atrial pressure of 3 mmHg. IAS/Shunts: No atrial level shunt detected by color flow Doppler. Agitated saline contrast was given intravenously to evaluate for intracardiac shunting. Agitated saline contrast bubble study was negative, with no evidence of any interatrial shunt.  LEFT VENTRICLE PLAX 2D LVIDd:         4.30 cm     Diastology LVIDs:         2.30 cm     LV e' medial:    11.20 cm/s LV PW:         0.90 cm     LV E/e' medial:  5.4 LV IVS:        0.90 cm     LV e' lateral:   12.50 cm/s LVOT diam:     2.02 cm     LV E/e' lateral: 4.8 LV SV:         47 LV SV Index:   26 LVOT Area:     3.20 cm  LV Volumes (MOD) LV vol d, MOD A2C: 71.3 ml LV vol d, MOD A4C: 60.0 ml LV vol s, MOD A2C: 20.3 ml LV vol s, MOD A4C: 19.5 ml LV SV MOD A2C:     51.0 ml LV SV MOD A4C:     60.0 ml LV SV MOD BP:      46.7 ml RIGHT VENTRICLE             IVC RV Basal diam:  2.69 cm     IVC diam: 1.06 cm RV S prime:     13.30 cm/s TAPSE (M-mode): 2.5 cm      PULMONARY VEINS                             Diastolic Velocity:  28.70 cm/s                             S/D Velocity:       1.60                             Systolic Velocity:  45.60 cm/s LEFT ATRIUM             Index        RIGHT ATRIUM           Index LA diam:        2.62 cm 1.47 cm/m   RA Area:     15.90 cm LA Vol (A2C):   36.1 ml 20.27 ml/m  RA Volume:   45.20 ml  25.38 ml/m LA Vol (A4C):   29.2 ml 16.40 ml/m LA Biplane Vol: 34.2 ml 19.21 ml/m  AORTIC VALVE                     PULMONIC VALVE AV Area (Vmax):    2.16 cm     PV Vmax:       0.53 m/s AV Area (Vmean):   2.06 cm     PV Peak grad:  1.1 mmHg AV Area (VTI):     2.32 cm AV Vmax:           101.00 cm/s AV Vmean:          71.500 cm/s AV VTI:            0.203 m AV Peak Grad:      4.1 mmHg AV Mean Grad:      2.0 mmHg LVOT Vmax:         68.20 cm/s LVOT Vmean:        45.900 cm/s LVOT VTI:          0.147 m LVOT/AV VTI ratio: 0.72  AORTA Ao Root diam: 2.88 cm Ao Asc diam:  2.89 cm MITRAL VALVE               TRICUSPID VALVE MV Area (PHT): 4.27 cm    TR Peak grad:   23.0 mmHg MV Area VTI:   2.22 cm    TR Vmax:        240.00 cm/s MV Peak grad:  1.3 mmHg MV Mean grad:  0.0 mmHg    SHUNTS MV Vmax:       0.57 m/s    Systemic VTI:  0.15 m MV Vmean:      32.0 cm/s   Systemic Diam: 2.02 cm MV Decel Time: 178 msec MV E velocity: 59.95 cm/s MV A velocity: 39.00 cm/s MV E/A ratio:  1.54 Franck Azobou Tonleu Electronically signed by Joelle Cedars Tonleu Signature Date/Time: 06/21/2024/4:14:19 PM    Final    CT Angio Chest PE W/Cm &/Or Wo Cm Result Date: 06/21/2024 EXAM: CTA of the Chest with contrast for PE 06/21/2024 10:05:00 AM TECHNIQUE: CTA of the chest was performed without and with the administration of 75 mL of iohexol  (OMNIPAQUE ) 350 MG/ML injection. Multiplanar reformatted images are provided for review. MIP images are provided for review. Automated exposure control, iterative reconstruction, and/or weight based adjustment of the mA/kV was utilized to reduce the radiation dose to as low as reasonably achievable. COMPARISON: 11/03/2019 CLINICAL HISTORY: Pulmonary embolism (PE) suspected, high probability. FINDINGS: PULMONARY ARTERIES: Pulmonary arteries are adequately opacified for evaluation. At least  2 small peripheral filling defects are noted in lower lobe branches of the right pulmonary artery consistent with pulmonary emboli. Main pulmonary artery is normal in caliber. MEDIASTINUM: The heart and pericardium demonstrate no  acute abnormality. There is no acute abnormality of the thoracic aorta. LYMPH NODES: No mediastinal, hilar or axillary lymphadenopathy. LUNGS AND PLEURA: Mild biapical scarring is noted. A 5 mm ground glass opacity is noted posteriorly in the right middle lobe best seen on image 86 of series 6. No focal consolidation or pulmonary edema. No pleural effusion or pneumothorax. UPPER ABDOMEN: Limited images of the upper abdomen are unremarkable. SOFT TISSUES AND BONES: No acute bone or soft tissue abnormality. IMPRESSION: 1. Small peripheral Pulmonary emboli in lower lobe branches of the right pulmonary artery. This finding was discussed with Hamp [place], PA, at 10:21 am on 06/21/2024. 2. Mild biapical scarring. 3. 5 mm ground-glass pulmonary nodule in the right middle lobe; follow-up unenhanced chest CT in 6 to 12 months is recommended to ensure stability. Electronically signed by: Lynwood Seip MD 06/21/2024 10:22 AM EST RP Workstation: [id number]   DG Wrist Complete Left Result Date: 06/21/2024 CLINICAL DATA:  Left wrist pain after fall EXAM: LEFT WRIST - COMPLETE 3 VIEW COMPARISON:  Left wrist radiograph dated 04/28/2015 FINDINGS: There is no evidence of fracture or dislocation. Increased severe degenerative changes of the wrist with diffuse loss of joint space, sclerosis, and subchondral lucencies. Soft tissues are unremarkable. 4 mm linear radiodensity projecting over the palmar aspect of the index finger metacarpal base. Surgical clips project over the volar and dorsal distal forearm. IMPRESSION: 1. No acute fracture or dislocation. 2. Increased severe degenerative changes of the wrist. 3. A 4 mm linear radiodensity projecting over the palmar aspect of the index finger metacarpal base may represent a foreign body. Electronically Signed   By: Limin  Xu M.D.   On: 06/21/2024 07:58    Microbiology: Recent Results (from the past 240 hours)  Culture, blood (Routine X 2) w Reflex to ID Panel     Status: None  (Preliminary result)   Collection Time: 06/21/24  5:45 PM   Specimen: BLOOD RIGHT ARM  Result Value Ref Range Status   Specimen Description BLOOD RIGHT ARM  Final   Special Requests   Final    BOTTLES DRAWN AEROBIC AND ANAEROBIC Blood Culture adequate volume   Culture   Final    NO GROWTH < 24 HOURS Performed at [facility], [address], [place], [place] [address]    Report Status PENDING  Incomplete  Culture, blood (Routine X 2) w Reflex to ID Panel     Status: None (Preliminary result)   Collection Time: 06/21/24  5:45 PM   Specimen: BLOOD LEFT ARM  Result Value Ref Range Status   Specimen Description BLOOD LEFT ARM  Final   Special Requests   Final    BOTTLES DRAWN AEROBIC AND ANAEROBIC Blood Culture adequate volume   Culture   Final    NO GROWTH < 24 HOURS Performed at [facility], [address], [place], [place] [address]    Report Status PENDING  Incomplete     Labs: Basic Metabolic Panel: Recent Labs  Lab 06/21/24 0607 06/22/24 0813  NA 139 139  K 3.6 3.9  CL 102 100  CO2 28 27  GLUCOSE 214* 94  BUN 18 13  CREATININE 1.44* 1.04  CALCIUM 8.8* 8.7*   Liver Function Tests: Recent Labs  Lab 06/21/24 0607 06/22/24 0813  AST 25 19  ALT 15 11  ALKPHOS 66 57  BILITOT 0.8 0.7  PROT 6.9 6.9  ALBUMIN  3.0* 2.8*   No results for input(s): LIPASE, AMYLASE in the last 168 hours. No results for input(s): AMMONIA in the last 168 hours. CBC: Recent Labs  Lab 06/21/24 0607 06/22/24 0813  WBC 5.8 9.5  NEUTROABS 4.3 7.5  HGB 13.9 13.6  HCT 40.9 39.2  MCV 87.8 87.1  PLT 223 213   Cardiac Enzymes: No results for input(s): CKTOTAL, CKMB, CKMBINDEX, TROPONINI in the last 168 hours. BNP: BNP (last 3 results) No results for input(s): BNP in the last 8760 hours.  ProBNP (last 3 results) No results for input(s): PROBNP in the last 8760 hours.  CBG: No results for input(s): GLUCAP in the last 168  hours.  Signed:  Colen Grimes MD   [facility] 06/23/2024, 9:39 AM

## 2024-06-25 ENCOUNTER — Telehealth: Payer: Self-pay

## 2024-06-25 NOTE — Transitions of Care (Post Inpatient/ED Visit) (Signed)
   06/25/2024  Name: Mitchell Cooper MRN: [id number] DOB: 31-Oct-1960  Today's TOC FU Call Status: Today's TOC FU Call Status:: Unsuccessful Call (1st Attempt) Unsuccessful Call (1st Attempt) Date: 06/25/24  Attempted to reach the patient regarding the most recent Inpatient/ED visit.  Follow Up Plan: Additional outreach attempts will be made to reach the patient to complete the Transitions of Care (Post Inpatient/ED visit) call.   Signature  Slater Diesel, RN

## 2024-06-26 ENCOUNTER — Telehealth: Payer: Self-pay | Admitting: Internal Medicine

## 2024-06-26 ENCOUNTER — Telehealth: Payer: Self-pay

## 2024-06-26 LAB — CULTURE, BLOOD (ROUTINE X 2)
Culture: NO GROWTH
Culture: NO GROWTH
Special Requests: ADEQUATE
Special Requests: ADEQUATE

## 2024-06-26 NOTE — Telephone Encounter (Signed)
 Copied from CRM [id number]. Topic: Appointments - Scheduling Inquiry for Clinic >> Jun 26, 2024 12:27 PM Darshell M wrote:  Reason for CRM: Offered patient first available for hospital follow up on 12/18 with Dr. Tanda but patient wants to see PCP Dr. Vicci. Patient was advised by discharging provider to be seen asap due to blood clots in lungs. Patient looking for appointment this week. Patient CB#[phone]

## 2024-06-26 NOTE — Transitions of Care (Post Inpatient/ED Visit) (Signed)
   06/26/2024  Name: Mitchell Cooper MRN: [id number] DOB: [birth year]  Today's TOC FU Call Status: Today's TOC FU Call Status:: Successful TOC FU Call Completed Unsuccessful Call (1st Attempt) Date: 06/25/24 [facility] FU Call Complete Date: 06/26/24  Patient's Name and Date of Birth confirmed. DOB, Name  Transition Care Management Follow-up Telephone Call Date of Discharge: 06/23/24 Discharge Facility: [facility]) Type of Discharge: Inpatient Admission Primary Inpatient Discharge Diagnosis:: syncope How have you been since you were released from the hospital?: Better Any questions or concerns?: No  Items Reviewed: Did you receive and understand the discharge instructions provided?: Yes Medications obtained,verified, and reconciled?: Yes (Medications Reviewed) (He has all medications and did not have any questions about the med regime) Any new allergies since your discharge?: No Dietary orders reviewed?: Yes Type of Diet Ordered:: heart healthy, low sodium.  He stated he has to eat a soft diet since his surgery. Do you have support at home?: Yes Name of Support/Comfort Primary Source: he said he has help but did not specify who  Medications Reviewed Today: Medications Reviewed Today     Reviewed by Marvis Bradley, RN (Case Manager) on 06/26/24 at 1351  Med List Status: <None>   Medication Order Taking? Sig Documenting Provider Last Dose Status Informant  acetaminophen  (TYLENOL ) 500 MG tablet [id number] No Take 1,000 mg by mouth every 6 (six) hours as needed for moderate pain (pain score 4-6). [provider] Past Week Active Self, Pharmacy Records  amLODipine  (NORVASC ) 5 MG tablet [id number] No Take 1 tablet (5 mg total) by mouth daily. Please schedule an appointment for more refills. Vicci Barnie NOVAK, MD 06/20/2024 Active Self, Pharmacy Records  RIVAROXABAN  (XARELTO ) VTE STARTER PACK (15 & 20 MG) [id number]  Follow package directions: Take one 15mg  tablet by mouth twice a  day. On day 22, switch to one 20mg  tablet once a day. Take with food. Samtani, Jai-Gurmukh, MD  Active             Home Care and Equipment/Supplies: Were Home Health Services Ordered?: No Any new equipment or medical supplies ordered?: No  Functional Questionnaire: Do you need assistance with bathing/showering or dressing?: No Do you need assistance with meal preparation?: No Do you need assistance with eating?: No Do you have difficulty maintaining continence: No Do you need assistance with getting out of bed/getting out of a chair/moving?: No Do you have difficulty managing or taking your medications?: No  Follow up appointments reviewed: PCP Follow-up appointment confirmed?: Yes Date of PCP follow-up appointment?: 07/02/24 Follow-up Provider: Folashade Paseda, NP  - [facility] Follow-up appointment confirmed?: NA (He said he will speak to the provider at the appointment on 07/02/2024 about a referral to [facility] regarding his stents.) Do you need transportation to your follow-up appointment?: No Do you understand care options if your condition(s) worsen?: Yes-patient verbalized understanding    SIGNATURE Bradley Marvis, RN

## 2024-06-26 NOTE — Telephone Encounter (Signed)
 Contacted patient. [facility] the [facility] for [facility] next available appointment.

## 2024-06-26 NOTE — Telephone Encounter (Signed)
 SABRA

## 2024-06-27 ENCOUNTER — Other Ambulatory Visit: Payer: Self-pay

## 2024-07-02 ENCOUNTER — Encounter: Payer: Self-pay | Admitting: Nurse Practitioner

## 2024-07-02 ENCOUNTER — Ambulatory Visit: Payer: Self-pay | Admitting: Nurse Practitioner

## 2024-07-02 VITALS — BP 116/73 | HR 85 | Wt 129.0 lb

## 2024-07-02 DIAGNOSIS — Z87891 Personal history of nicotine dependence: Secondary | ICD-10-CM

## 2024-07-02 DIAGNOSIS — M19032 Primary osteoarthritis, left wrist: Secondary | ICD-10-CM | POA: Insufficient documentation

## 2024-07-02 DIAGNOSIS — Z0000 Encounter for general adult medical examination without abnormal findings: Secondary | ICD-10-CM | POA: Diagnosis not present

## 2024-07-02 DIAGNOSIS — Z09 Encounter for follow-up examination after completed treatment for conditions other than malignant neoplasm: Secondary | ICD-10-CM | POA: Diagnosis not present

## 2024-07-02 DIAGNOSIS — R911 Solitary pulmonary nodule: Secondary | ICD-10-CM | POA: Diagnosis not present

## 2024-07-02 DIAGNOSIS — I2699 Other pulmonary embolism without acute cor pulmonale: Secondary | ICD-10-CM | POA: Diagnosis not present

## 2024-07-02 DIAGNOSIS — C090 Malignant neoplasm of tonsillar fossa: Secondary | ICD-10-CM | POA: Diagnosis not present

## 2024-07-02 DIAGNOSIS — R0602 Shortness of breath: Secondary | ICD-10-CM | POA: Insufficient documentation

## 2024-07-02 DIAGNOSIS — R55 Syncope and collapse: Secondary | ICD-10-CM

## 2024-07-02 DIAGNOSIS — N179 Acute kidney failure, unspecified: Secondary | ICD-10-CM

## 2024-07-02 DIAGNOSIS — Z86711 Personal history of pulmonary embolism: Secondary | ICD-10-CM | POA: Diagnosis not present

## 2024-07-02 NOTE — Assessment & Plan Note (Signed)
 Stated that he received the flu vaccine at the hospital Encouraged to get Tdap vaccine and shingles vaccine at a pharmacy

## 2024-07-02 NOTE — Assessment & Plan Note (Addendum)
 History of pulmonary embolism Recent syncope likely due to mild to mild orthostasis and acute kidney injury. Shortness of breath likely related to recent pulmonary embolism and activity level. - Continue Xarelto  15 mg twice daily for 21 days, then reduce to 20 mg once daily. - Ensure medication supply at [facility]  pharmacy. - Advised gradual increase in activity as tolerated.

## 2024-07-02 NOTE — Assessment & Plan Note (Signed)
 Hospital chart reviewed, including discharge summary Medications reconciled and reviewed with the patient in detail

## 2024-07-02 NOTE — Assessment & Plan Note (Signed)
 Syncope likely secondary to mild orthostasis in addition to AKI  Syncope now resolved rechecking CBC

## 2024-07-02 NOTE — Assessment & Plan Note (Addendum)
 Lab Results  Component Value Date   NA 139 06/22/2024   K 3.9 06/22/2024   CO2 27 06/22/2024   GLUCOSE 94 06/22/2024   BUN 13 06/22/2024   CREATININE 1.04 06/22/2024   CALCIUM 8.7 (L) 06/22/2024   EGFR 72 12/14/2023   GFRNONAA >60 06/22/2024    Acute kidney injury resolved with improved kidney function before discharge. - Rechecked kidney function with labs today.

## 2024-07-02 NOTE — Patient Instructions (Addendum)
 [facility] Address: [address], [place], [place] [address] Phone: [phone]  Follow up with Dr Barnie in 4 months     It is important that you exercise regularly at least 30 minutes 5 times a week as tolerated  Think about what you will eat, plan ahead. Choose  clean, green, fresh or frozen over canned, processed or packaged foods which are more sugary, salty and fatty. 70 to 75% of food eaten should be vegetables and fruit. Three meals at set times with snacks allowed between meals, but they must be fruit or vegetables. Aim to eat over a 12 hour period , example 7 am to 7 pm, and STOP after  your last meal of the day. Drink water,generally about 64 ounces per day, no other drink is as healthy. Fruit juice is best enjoyed in a healthy way, by EATING the fruit.  Thanks for choosing [facility] we consider it a privelige to serve you.

## 2024-07-02 NOTE — Assessment & Plan Note (Signed)
 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 - Continue follow-up with oncology team at [facility].

## 2024-07-02 NOTE — Assessment & Plan Note (Signed)
 Left wrist osteoarthritis with recent injury Recent fall caused left wrist injury. X-ray showed severe degenerative changes consistent with osteoarthritis. Swelling and pain persist. - Referred to orthopedic specialist Dr. Beverley Economy for further evaluation and management.

## 2024-07-02 NOTE — Progress Notes (Signed)
 Established Patient Office Visit  Subjective:  Patient ID: Mitchell Cooper, male    DOB: 1961/03/18  Age: 63 y.o. MRN: [id number]  CC:  Chief Complaint  Patient presents with   Loss of Consciousness    Two weeks ago, went to the hospital     HPI   Discussed the use of AI scribe software for clinical note transcription with the patient, who gave verbal consent to proceed.  History of Present Illness Mitchell Cooper is a 63 year old male with a history of pulmonary embolism and tonsillar cancer who presents for follow-up after a recent hospitalization for syncope.  He was hospitalized from December 4th to December 6th following a syncope episode in the setting of a pulmonary embolism. An echocardiogram was performed and was normal. He experiences ongoing shortness of breath, which limits his walking. He is currently on Xarelto , taking 15 mg twice a day for 21 days, then transitioning to 20 mg once a day. He has had difficulty obtaining his medication due to pharmacy issues and requires an appointment for refills.  He has a history of tonsillar cancer and has quit smoking, having previously smoked a pack of cigarettes that would last four days, primarily when drinking beer. He underwent skin graft and plastic surgery related to his cancer treatment.  During the syncope episode, he experienced a fall resulting in swelling and pain in his left wrist. An x-ray was performed and the patient was told there was no fracture, but that he may have arthritis. He has been using Tylenol  with codeine for pain management, which has been ineffective.  He has pulmonary nodules on the right side of his lungs, with a follow-up CT scan recommended in six to twelve months. No fever, chills, or chest pain but experiences coldness due to anemia. He is staying hydrated, drinking at least 64 ounces of water daily.  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       Past Medical History:  Diagnosis Date   Cancer (HCC)    Left Tonsillar Cancer    DVT (deep venous thrombosis) (HCC)    Hypertension 1997   has never taken medications   Tobacco dependence     Past Surgical History:  Procedure Laterality Date   IR GASTROSTOMY TUBE MOD SED  09/11/2020   IR IMAGING GUIDED PORT INSERTION  09/11/2020   KNEE SURGERY  approx. 1984   reconstructive surgery of left knee; arthroscopic procedure about 1997   MULTIPLE EXTRACTIONS WITH ALVEOLOPLASTY N/A 09/25/2020   Procedure: MULTIPLE EXTRACTIONS WITH ALVEOLOPLASTY;  Surgeon: Celena Lum NOVAK, DMD;  Location: [facility] OR;  Service: Dentistry;  Laterality: N/A;   PERIPHERAL VASCULAR CATHETERIZATION Left 06/16/2016   Procedure: Lower Extremity Venography- Venus Thrombolysis;  Surgeon: Gaile LELON New, MD;  Location: [facility] INVASIVE CV LAB;  Service: Cardiovascular;  Laterality: Left;    Family History  Problem Relation Age of Onset   CAD Mother 71    Social History   Socioeconomic History   Marital status: Married    Spouse name: Not on file   Number of children: Not on file   Years of education: Not on file   Highest education level: Not on file  Occupational History   Occupation: holiday representative  Tobacco Use   Smoking status: Former    Current packs/day: 0.00    Types: Cigarettes    Quit date: 11/21/2016    Years since quitting:  7.6   Smokeless tobacco: Never  Vaping Use   Vaping status: Never Used  Substance and Sexual Activity   Alcohol use: Yes    Alcohol/week: 6.0 standard drinks of alcohol    Types: 6 Cans of beer per week   Drug use: No   Sexual activity: Not Currently  Other Topics Concern   Not on file  Social History Narrative   Not on file   Social Drivers of Health   Tobacco Use: Medium Risk (07/02/2024)   Patient History    Smoking Tobacco Use: Former    Smokeless Tobacco Use: Never    Passive Exposure: Not on Actuary Strain: Not on file  Food  Insecurity: No Food Insecurity (06/21/2024)   Epic    Worried About Programme Researcher, Broadcasting/film/video in the Last Year: Never true    Ran Out of Food in the Last Year: Never true  Transportation Needs: No Transportation Needs (06/21/2024)   Epic    Lack of Transportation (Medical): No    Lack of Transportation (Non-Medical): No  Physical Activity: Not on file  Stress: Not on file  Social Connections: Not on file  Intimate Partner Violence: Not At Risk (06/21/2024)   Epic    Fear of Current or Ex-Partner: No    Emotionally Abused: No    Physically Abused: No    Sexually Abused: No  Depression (PHQ2-9): Low Risk (07/02/2024)   Depression (PHQ2-9)    PHQ-2 Score: 0  Alcohol Screen: Not on file  Housing: High Risk (06/21/2024)   Epic    Unable to Pay for Housing in the Last Year: No    Number of Times Moved in the Last Year: 0    Homeless in the Last Year: Yes  Utilities: Not At Risk (06/21/2024)   Epic    Threatened with loss of utilities: No  Health Literacy: Not on file    Outpatient Medications Prior to Visit  Medication Sig Dispense Refill   acetaminophen  (TYLENOL ) 500 MG tablet Take 1,000 mg by mouth every 6 (six) hours as needed for moderate pain (pain score 4-6).     amLODipine  (NORVASC ) 5 MG tablet Take 1 tablet (5 mg total) by mouth daily. Please schedule an appointment for more refills. 30 tablet 0   RIVAROXABAN  (XARELTO ) VTE STARTER PACK (15 & 20 MG) Follow package directions: Take one 15mg  tablet by mouth twice a day. On day 22, switch to one 20mg  tablet once a day. Take with food. 51 each 12   No facility-administered medications prior to visit.    Allergies[1]  ROS Review of Systems  Constitutional:  Negative for appetite change, chills, fatigue and fever.  HENT:  Negative for congestion, postnasal drip, rhinorrhea and sneezing.   Respiratory:  Positive for shortness of breath. Negative for cough and wheezing.   Cardiovascular:  Negative for chest pain, palpitations and leg  swelling.  Gastrointestinal:  Negative for abdominal pain, constipation, nausea and vomiting.  Genitourinary:  Negative for difficulty urinating, dysuria, flank pain and frequency.  Musculoskeletal:  Positive for arthralgias and joint swelling. Negative for back pain.  Skin:  Negative for color change, pallor, rash and wound.  Neurological:  Negative for facial asymmetry, weakness, numbness and headaches.  Psychiatric/Behavioral:  Negative for behavioral problems, confusion, self-injury and suicidal ideas.       Objective:    Physical Exam Vitals and nursing note reviewed.  Constitutional:      General: He is not in acute distress.  Appearance: Normal appearance. He is not ill-appearing, toxic-appearing or diaphoretic.  Eyes:     General: No scleral icterus.       Right eye: No discharge.        Left eye: No discharge.     Extraocular Movements: Extraocular movements intact.     Conjunctiva/sclera: Conjunctivae normal.  Cardiovascular:     Rate and Rhythm: Normal rate and regular rhythm.     Pulses: Normal pulses.     Heart sounds: Normal heart sounds. No murmur heard.    No friction rub. No gallop.  Pulmonary:     Effort: Pulmonary effort is normal. No respiratory distress.     Breath sounds: Normal breath sounds. No stridor. No wheezing, rhonchi or rales.  Chest:     Chest wall: No tenderness.  Abdominal:     General: There is no distension.     Palpations: Abdomen is soft.     Tenderness: There is no abdominal tenderness. There is no right CVA tenderness, left CVA tenderness or guarding.  Musculoskeletal:        General: Tenderness present. No swelling, deformity or signs of injury.     Right lower leg: No edema.     Left lower leg: No edema.     Comments: Left wrist swelling and tenderness, has palpable radial pulse  Skin:    General: Skin is warm and dry.     Capillary Refill: Capillary refill takes less than 2 seconds.     Coloration: Skin is not jaundiced or  pale.     Findings: No bruising, erythema or lesion.  Neurological:     Mental Status: He is alert and oriented to person, place, and time.     Motor: No weakness.     Gait: Gait abnormal.  Psychiatric:        Mood and Affect: Mood normal.        Behavior: Behavior normal.        Thought Content: Thought content normal.        Judgment: Judgment normal.     BP 116/73   Pulse 85   Wt 129 lb (58.5 kg)   SpO2 99%   BMI 19.05 kg/m  Wt Readings from Last 3 Encounters:  07/02/24 129 lb (58.5 kg)  06/21/24 125 lb 4.8 oz (56.8 kg)  12/14/23 126 lb 9.6 oz (57.4 kg)    Lab Results  Component Value Date   TSH 0.823 10/09/2020   Lab Results  Component Value Date   WBC 9.5 06/22/2024   HGB 13.6 06/22/2024   HCT 39.2 06/22/2024   MCV 87.1 06/22/2024   PLT 213 06/22/2024   Lab Results  Component Value Date   NA 139 06/22/2024   K 3.9 06/22/2024   CO2 27 06/22/2024   GLUCOSE 94 06/22/2024   BUN 13 06/22/2024   CREATININE 1.04 06/22/2024   BILITOT 0.7 06/22/2024   ALKPHOS 57 06/22/2024   AST 19 06/22/2024   ALT 11 06/22/2024   PROT 6.9 06/22/2024   ALBUMIN  2.8 (L) 06/22/2024   CALCIUM 8.7 (L) 06/22/2024   ANIONGAP 12 06/22/2024   EGFR 72 12/14/2023   No results found for: CHOL No results found for: HDL No results found for: LDLCALC No results found for: TRIG No results found for: [facility] Lab Results  Component Value Date   HGBA1C 5.5 06/14/2016      Assessment & Plan:   Problem List Items Addressed This Visit  Cardiovascular and Mediastinum   Recurrent pulmonary emboli (HCC)   History of pulmonary embolism Recent syncope likely due to mild to mild orthostasis and acute kidney injury. Shortness of breath likely related to recent pulmonary embolism and activity level. - Continue Xarelto  15 mg twice daily for 21 days, then reduce to 20 mg once daily. - Ensure medication supply at [facility]  pharmacy. - Advised gradual increase in activity  as tolerated.        Relevant Orders   CBC     Respiratory   Carcinoma of tonsillar fossa (HCC)   Carcinoma of tonsillar fossa Under treatment for tonsillar cancer. - Continue follow-up with oncology team at [facility].       Pulmonary nodule    Study Result  Narrative & Impression   3. 5 mm ground-glass pulmonary nodule in the right middle lobe; follow-up unenhanced chest CT in 6 to 12 months is recommended to ensure stability.        Relevant Orders   CT Chest Wo Contrast     Musculoskeletal and Integument   Osteoarthritis of left wrist   Relevant Orders   Ambulatory referral to Orthopedic Surgery     Genitourinary   AKI (acute kidney injury)   Lab Results  Component Value Date   NA 139 06/22/2024   K 3.9 06/22/2024   CO2 27 06/22/2024   GLUCOSE 94 06/22/2024   BUN 13 06/22/2024   CREATININE 1.04 06/22/2024   CALCIUM 8.7 (L) 06/22/2024   EGFR 72 12/14/2023   GFRNONAA >60 06/22/2024    Acute kidney injury resolved with improved kidney function before discharge. - Rechecked kidney function with labs today.       Relevant Orders   Basic Metabolic Panel     Other   Former smoker   Stated that he has quit smoking cigarettes Encouraged to continue to abstain from smoking cigarettes      Syncope - Primary   Syncope likely secondary to mild orthostasis in addition to AKI  Syncope now resolved rechecking CBC      Hospital discharge follow-up   Hospital chart reviewed, including discharge summary Medications reconciled and reviewed with the patient in detail       Health care maintenance   Stated that he received the flu vaccine at the hospital Encouraged to get Tdap vaccine and shingles vaccine at a pharmacy      Shortness of breath   Most likely due to recent pulmonary embolism Shortness of breath improving Consider referral to pulmonologist if shortness of breath persist       No orders of the defined types were placed in this  encounter.   Follow-up: Return in about 4 months (around 10/31/2024).    Kylen Schliep R Clearence Vitug, FNP     [1] No Known Allergies

## 2024-07-02 NOTE — Assessment & Plan Note (Signed)
°  Study Result  Narrative & Impression   3. 5 mm ground-glass pulmonary nodule in the right middle lobe; follow-up unenhanced chest CT in 6 to 12 months is recommended to ensure stability.

## 2024-07-02 NOTE — Assessment & Plan Note (Signed)
 Stated that he has quit smoking cigarettes Encouraged to continue to abstain from smoking cigarettes

## 2024-07-02 NOTE — Assessment & Plan Note (Signed)
 Most likely due to recent pulmonary embolism Shortness of breath improving Consider referral to pulmonologist if shortness of breath persist

## 2024-07-03 ENCOUNTER — Encounter: Payer: Self-pay | Admitting: Hematology and Oncology

## 2024-07-03 ENCOUNTER — Other Ambulatory Visit: Payer: Self-pay

## 2024-07-03 ENCOUNTER — Ambulatory Visit: Payer: Self-pay | Admitting: Nurse Practitioner

## 2024-07-03 DIAGNOSIS — R739 Hyperglycemia, unspecified: Secondary | ICD-10-CM

## 2024-07-03 LAB — CBC
Hematocrit: 40.3 % (ref 37.5–51.0)
Hemoglobin: 13.2 g/dL (ref 13.0–17.7)
MCH: 29 pg (ref 26.6–33.0)
MCHC: 32.8 g/dL (ref 31.5–35.7)
MCV: 89 fL (ref 79–97)
Platelets: 257 x10E3/uL (ref 150–450)
RBC: 4.55 x10E6/uL (ref 4.14–5.80)
RDW: 14.3 % (ref 11.6–15.4)
WBC: 4.5 x10E3/uL (ref 3.4–10.8)

## 2024-07-03 LAB — BASIC METABOLIC PANEL WITH GFR
BUN/Creatinine Ratio: 13 (ref 10–24)
BUN: 17 mg/dL (ref 8–27)
CO2: 21 mmol/L (ref 20–29)
Calcium: 9.3 mg/dL (ref 8.6–10.2)
Chloride: 103 mmol/L (ref 96–106)
Creatinine, Ser: 1.27 mg/dL (ref 0.76–1.27)
Glucose: 144 mg/dL — ABNORMAL HIGH (ref 70–99)
Potassium: 4 mmol/L (ref 3.5–5.2)
Sodium: 139 mmol/L (ref 134–144)
eGFR: 64 mL/min/1.73 (ref >59)

## 2024-07-04 DIAGNOSIS — M19032 Primary osteoarthritis, left wrist: Secondary | ICD-10-CM | POA: Diagnosis not present

## 2024-07-04 LAB — HEMOGLOBIN A1C
Est. average glucose Bld gHb Est-mCnc: 100 mg/dL
Hgb A1c MFr Bld: 5.1 % (ref 4.8–5.6)

## 2024-07-04 LAB — SPECIMEN STATUS REPORT

## 2024-07-15 ENCOUNTER — Other Ambulatory Visit (INDEPENDENT_AMBULATORY_CARE_PROVIDER_SITE_OTHER): Payer: Self-pay | Admitting: Internal Medicine

## 2024-08-10 ENCOUNTER — Encounter: Payer: Self-pay | Admitting: Hematology and Oncology

## 2024-08-10 NOTE — Telephone Encounter (Signed)
 Call completed 06/22/2021.

## 2024-08-15 ENCOUNTER — Telehealth: Payer: Self-pay

## 2024-08-15 NOTE — Progress Notes (Signed)
 Complex Care Management Note Care Guide Note  08/15/2024 Name: Mitchell Cooper MRN: [id number] DOB: 01/13/1961   Complex Care Management Outreach Attempts: An unsuccessful telephone outreach was attempted today to offer the patient information about available complex care management services.  Follow Up Plan:  Additional outreach attempts will be made to offer the patient complex care management information and services.   Encounter Outcome:  No answer and unable to leave a message     Jon Colt [facility]  [facility] Guide, Phone: [phone] Fax: [phone] Website: [email or web address]

## 2024-08-16 ENCOUNTER — Telehealth: Payer: Self-pay

## 2024-08-16 DIAGNOSIS — K029 Dental caries, unspecified: Secondary | ICD-10-CM

## 2024-08-16 NOTE — Progress Notes (Signed)
 Complex Care Management Note  Care Guide Note 08/16/2024 Name: [name] MRN: [id number] DOB: [birth year]  Mitchell Cooper is a 64 y.o. year old male who sees Vicci Barnie NOVAK, MD for primary care. I reached out to Vinie KANDICE Silos by phone today to offer complex care management services.  Mr. Nestle was given information about Complex Care Management services today including:   The Complex Care Management services include support from the care team which includes your Nurse Care Manager, Clinical Social Worker, or Pharmacist.  The Complex Care Management team is here to help remove barriers to the health concerns and goals most important to you. Complex Care Management services are voluntary, and the patient may decline or stop services at any time by request to their care team member.   Complex Care Management Consent Status: Patient agreed to services and verbal consent obtained.   Follow up plan:  Telephone appointment with complex care management team member scheduled for:  09/11/2024  Encounter Outcome:  Patient Scheduled   Jon Colt [facility]  [facility] Guide, Phone: [phone] Fax: [phone] Website: [email or web address]

## 2024-08-16 NOTE — Progress Notes (Signed)
" ° °  Telephone encounter was:  Successful.  Complex Care Management Note Care Guide Note  08/16/2024 Name: Mitchell Cooper MRN: [id number] DOB: 12-19-1960  Mitchell Cooper is a 64 y.o. year old male who is a primary care patient of Vicci Barnie NOVAK, MD . The community resource team was consulted for assistance with Food Insecurity and Financial Difficulties related to Financial strain  SDOH screenings and interventions completed:  Yes  Social Drivers of Health From This Encounter   Food Insecurity: Food Insecurity Present (08/16/2024)   Epic    Worried About Programme Researcher, Broadcasting/film/video in the Last Year: Often true    Ran Out of Food in the Last Year: Often true  Financial Resource Strain: High Risk (08/16/2024)   Overall Financial Resource Strain (CARDIA)    Difficulty of Paying Living Expenses: Very hard  Utilities: At Risk (08/16/2024)   Epic    Threatened with loss of utilities: Yes    SDOH Interventions Today    Flowsheet Row Most Recent Value  SDOH Interventions   Food Insecurity Interventions Community Resources Provided, [place] Resources Referral  Utilities Interventions Community Resources Provided, [place] Resource Referral  Financial Strain Interventions [place] Resource Referral, [employer] Provided     Care guide performed the following interventions: Patient provided with information about care guide support team and interviewed to confirm resource needs.  Follow Up Plan:  Care guide will follow up with patient by phone over the next 72 hrs  Encounter Outcome:  Patient Visit Completed    Jon Colt [facility]  [facility] Guide, Phone: [phone] Fax: [phone] Website: [email or web address]    "

## 2024-08-22 ENCOUNTER — Telehealth: Payer: Self-pay

## 2024-08-22 NOTE — Progress Notes (Signed)
 LSW call for possible assistance with Ensure samples, [employer] Program and MD order for Medicaid to cover Ensure for Pt. We will follow up by end of the week.    Jon Colt [facility]  [facility] Guide, Phone: [phone] Fax: [phone] Website: [email or web address]

## 2024-09-11 ENCOUNTER — Telehealth: Admitting: Licensed Clinical Social Worker

## 2024-11-01 ENCOUNTER — Ambulatory Visit: Payer: Self-pay | Admitting: Internal Medicine
